# Patient Record
Sex: Female | Born: 1950 | Race: Black or African American | Hispanic: No | Smoking: Former smoker
Health system: Southern US, Community
[De-identification: ages and names within clinical notes are randomized; demographics above are authoritative.]

## PROBLEM LIST (undated history)

## (undated) DIAGNOSIS — M1990 Unspecified osteoarthritis, unspecified site: Secondary | ICD-10-CM

## (undated) DIAGNOSIS — N800 Endometriosis of uterus: Secondary | ICD-10-CM

## (undated) DIAGNOSIS — I951 Orthostatic hypotension: Secondary | ICD-10-CM

## (undated) DIAGNOSIS — N809 Endometriosis, unspecified: Secondary | ICD-10-CM

## (undated) DIAGNOSIS — H20029 Recurrent acute iridocyclitis, unspecified eye: Secondary | ICD-10-CM

## (undated) DIAGNOSIS — D259 Leiomyoma of uterus, unspecified: Secondary | ICD-10-CM

## (undated) DIAGNOSIS — D649 Anemia, unspecified: Secondary | ICD-10-CM

## (undated) DIAGNOSIS — N8003 Adenomyosis of the uterus: Secondary | ICD-10-CM

## (undated) DIAGNOSIS — I10 Essential (primary) hypertension: Secondary | ICD-10-CM

## (undated) DIAGNOSIS — D573 Sickle-cell trait: Secondary | ICD-10-CM

## (undated) HISTORY — DX: Adenomyosis of the uterus: N80.03

## (undated) HISTORY — DX: Endometriosis, unspecified: N80.9

## (undated) HISTORY — PX: BREAST LUMPECTOMY: SHX2

## (undated) HISTORY — DX: Unspecified osteoarthritis, unspecified site: M19.90

## (undated) HISTORY — DX: Recurrent acute iridocyclitis, unspecified eye: H20.029

## (undated) HISTORY — DX: Orthostatic hypotension: I95.1

## (undated) HISTORY — DX: Endometriosis of uterus: N80.0

## (undated) HISTORY — DX: Essential (primary) hypertension: I10

## (undated) HISTORY — DX: Sickle-cell trait: D57.3

## (undated) HISTORY — DX: Leiomyoma of uterus, unspecified: D25.9

## (undated) HISTORY — DX: Anemia, unspecified: D64.9

## (undated) HISTORY — PX: CHOLECYSTECTOMY: SHX55

## (undated) HISTORY — PX: FOOT SURGERY: SHX648

---

## 1984-09-29 HISTORY — PX: BREAST BIOPSY: SHX20

## 1999-01-10 ENCOUNTER — Emergency Department (HOSPITAL_COMMUNITY): Admission: EM | Admit: 1999-01-10 | Discharge: 1999-01-10 | Payer: Self-pay | Admitting: Emergency Medicine

## 1999-01-10 ENCOUNTER — Encounter: Payer: Self-pay | Admitting: Emergency Medicine

## 1999-04-23 ENCOUNTER — Encounter: Payer: Self-pay | Admitting: *Deleted

## 1999-04-26 ENCOUNTER — Ambulatory Visit (HOSPITAL_COMMUNITY): Admission: RE | Admit: 1999-04-26 | Discharge: 1999-04-27 | Payer: Self-pay | Admitting: *Deleted

## 2001-04-14 ENCOUNTER — Encounter: Admission: RE | Admit: 2001-04-14 | Discharge: 2001-04-14 | Payer: Self-pay | Admitting: Family Medicine

## 2001-04-14 ENCOUNTER — Other Ambulatory Visit: Admission: RE | Admit: 2001-04-14 | Discharge: 2001-04-14 | Payer: Self-pay | Admitting: Obstetrics & Gynecology

## 2001-05-14 ENCOUNTER — Encounter: Admission: RE | Admit: 2001-05-14 | Discharge: 2001-05-14 | Payer: Self-pay | Admitting: Family Medicine

## 2001-05-21 ENCOUNTER — Inpatient Hospital Stay (HOSPITAL_COMMUNITY): Admission: EM | Admit: 2001-05-21 | Discharge: 2001-05-24 | Payer: Self-pay

## 2001-05-24 ENCOUNTER — Encounter: Payer: Self-pay | Admitting: Family Medicine

## 2001-06-07 ENCOUNTER — Encounter: Admission: RE | Admit: 2001-06-07 | Discharge: 2001-06-07 | Payer: Self-pay | Admitting: Family Medicine

## 2001-08-04 ENCOUNTER — Encounter: Admission: RE | Admit: 2001-08-04 | Discharge: 2001-08-04 | Payer: Self-pay | Admitting: Family Medicine

## 2001-09-08 ENCOUNTER — Encounter: Admission: RE | Admit: 2001-09-08 | Discharge: 2001-09-08 | Payer: Self-pay | Admitting: Family Medicine

## 2002-01-28 ENCOUNTER — Encounter: Admission: RE | Admit: 2002-01-28 | Discharge: 2002-01-28 | Payer: Self-pay | Admitting: Family Medicine

## 2002-01-28 ENCOUNTER — Ambulatory Visit (HOSPITAL_COMMUNITY): Admission: RE | Admit: 2002-01-28 | Discharge: 2002-01-28 | Payer: Self-pay | Admitting: Family Medicine

## 2002-02-15 ENCOUNTER — Encounter: Admission: RE | Admit: 2002-02-15 | Discharge: 2002-02-15 | Payer: Self-pay | Admitting: Family Medicine

## 2002-06-21 ENCOUNTER — Encounter: Admission: RE | Admit: 2002-06-21 | Discharge: 2002-06-21 | Payer: Self-pay | Admitting: Family Medicine

## 2002-06-30 ENCOUNTER — Encounter: Admission: RE | Admit: 2002-06-30 | Discharge: 2002-06-30 | Payer: Self-pay | Admitting: Family Medicine

## 2002-07-11 ENCOUNTER — Other Ambulatory Visit: Admission: RE | Admit: 2002-07-11 | Discharge: 2002-07-11 | Payer: Self-pay | Admitting: Family Medicine

## 2002-07-11 ENCOUNTER — Encounter: Admission: RE | Admit: 2002-07-11 | Discharge: 2002-07-11 | Payer: Self-pay | Admitting: Family Medicine

## 2002-07-19 ENCOUNTER — Encounter: Admission: RE | Admit: 2002-07-19 | Discharge: 2002-07-19 | Payer: Self-pay | Admitting: Sports Medicine

## 2002-07-19 ENCOUNTER — Encounter: Payer: Self-pay | Admitting: Sports Medicine

## 2002-07-26 ENCOUNTER — Encounter: Admission: RE | Admit: 2002-07-26 | Discharge: 2002-09-15 | Payer: Self-pay | Admitting: *Deleted

## 2003-02-08 ENCOUNTER — Encounter: Admission: RE | Admit: 2003-02-08 | Discharge: 2003-02-08 | Payer: Self-pay | Admitting: Sports Medicine

## 2003-02-08 ENCOUNTER — Encounter: Admission: RE | Admit: 2003-02-08 | Discharge: 2003-02-08 | Payer: Self-pay | Admitting: Family Medicine

## 2003-02-08 ENCOUNTER — Encounter: Payer: Self-pay | Admitting: Sports Medicine

## 2003-02-27 ENCOUNTER — Encounter: Admission: RE | Admit: 2003-02-27 | Discharge: 2003-02-27 | Payer: Self-pay | Admitting: Sports Medicine

## 2003-11-27 ENCOUNTER — Encounter: Admission: RE | Admit: 2003-11-27 | Discharge: 2003-11-27 | Payer: Self-pay | Admitting: Family Medicine

## 2004-01-10 ENCOUNTER — Encounter: Admission: RE | Admit: 2004-01-10 | Discharge: 2004-01-10 | Payer: Self-pay | Admitting: Family Medicine

## 2004-03-04 ENCOUNTER — Encounter: Admission: RE | Admit: 2004-03-04 | Discharge: 2004-03-04 | Payer: Self-pay | Admitting: Sports Medicine

## 2004-03-07 ENCOUNTER — Encounter: Admission: RE | Admit: 2004-03-07 | Discharge: 2004-03-07 | Payer: Self-pay | Admitting: Sports Medicine

## 2004-04-03 ENCOUNTER — Encounter: Admission: RE | Admit: 2004-04-03 | Discharge: 2004-04-03 | Payer: Self-pay | Admitting: Sports Medicine

## 2004-04-03 ENCOUNTER — Other Ambulatory Visit: Admission: RE | Admit: 2004-04-03 | Discharge: 2004-04-03 | Payer: Self-pay | Admitting: Family Medicine

## 2004-04-03 ENCOUNTER — Encounter (INDEPENDENT_AMBULATORY_CARE_PROVIDER_SITE_OTHER): Payer: Self-pay | Admitting: Specialist

## 2004-04-17 ENCOUNTER — Encounter: Admission: RE | Admit: 2004-04-17 | Discharge: 2004-04-17 | Payer: Self-pay | Admitting: Family Medicine

## 2004-05-24 ENCOUNTER — Encounter: Admission: RE | Admit: 2004-05-24 | Discharge: 2004-05-24 | Payer: Self-pay | Admitting: Family Medicine

## 2004-07-25 ENCOUNTER — Ambulatory Visit (HOSPITAL_COMMUNITY): Admission: RE | Admit: 2004-07-25 | Discharge: 2004-07-25 | Payer: Self-pay | Admitting: Family Medicine

## 2004-07-25 ENCOUNTER — Ambulatory Visit: Payer: Self-pay | Admitting: Family Medicine

## 2004-08-12 ENCOUNTER — Ambulatory Visit: Payer: Self-pay | Admitting: Family Medicine

## 2004-09-09 ENCOUNTER — Ambulatory Visit (HOSPITAL_COMMUNITY): Admission: RE | Admit: 2004-09-09 | Discharge: 2004-09-09 | Payer: Self-pay | Admitting: Sports Medicine

## 2004-09-09 ENCOUNTER — Ambulatory Visit: Payer: Self-pay | Admitting: Sports Medicine

## 2004-10-24 ENCOUNTER — Ambulatory Visit: Payer: Self-pay | Admitting: Sports Medicine

## 2004-11-06 ENCOUNTER — Ambulatory Visit: Payer: Self-pay | Admitting: Family Medicine

## 2004-11-13 ENCOUNTER — Ambulatory Visit: Payer: Self-pay | Admitting: Family Medicine

## 2004-11-26 ENCOUNTER — Ambulatory Visit: Payer: Self-pay | Admitting: Sports Medicine

## 2006-08-29 ENCOUNTER — Encounter (INDEPENDENT_AMBULATORY_CARE_PROVIDER_SITE_OTHER): Payer: Self-pay | Admitting: *Deleted

## 2006-08-29 LAB — CONVERTED CEMR LAB

## 2006-09-25 ENCOUNTER — Ambulatory Visit: Payer: Self-pay | Admitting: Family Medicine

## 2006-09-30 ENCOUNTER — Ambulatory Visit (HOSPITAL_COMMUNITY): Admission: RE | Admit: 2006-09-30 | Discharge: 2006-09-30 | Payer: Self-pay | Admitting: Family Medicine

## 2006-09-30 IMAGING — US US PELVIS COMPLETE MODIFY
1 series · 18 of 25 positions shown · non-contrast
Comparison: [DATE].

CLINICAL DATA: Pelvic pain; fibroids.
TRANSABDOMINAL AND TRANSVAGINAL PELVIC ULTRASOUND:
TECHNIQUE: Both transabdominal and transvaginal ultrasound examinations of the pelvis were performed including evaluation of the uterus, ovaries, adnexal regions, and pelvic cul-de-sac.

[Series 1: us pelvis complete modify · 18 of 51 slices shown]
[im 1/51]
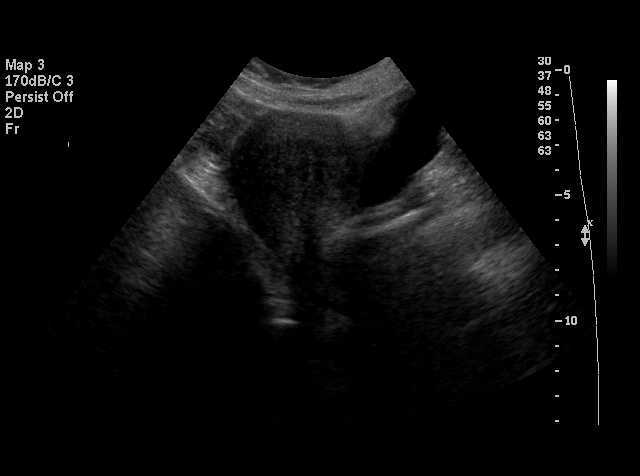
[im 5/51]
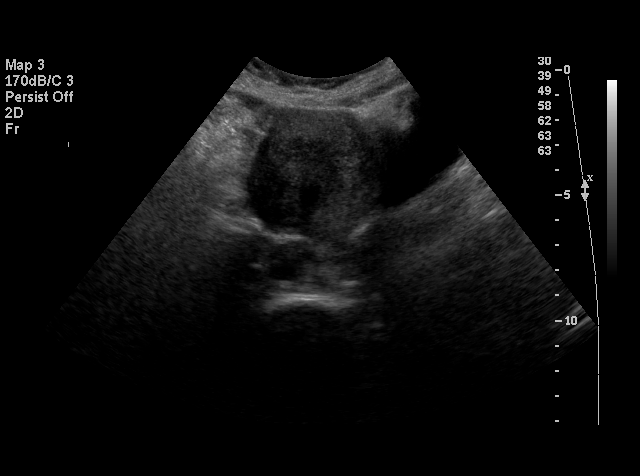
[im 7/51]
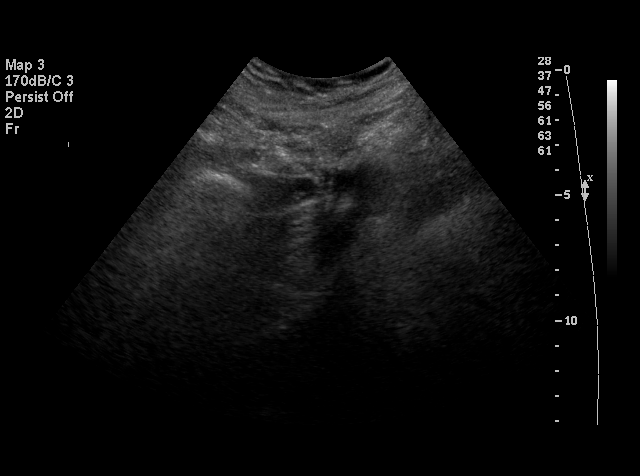
[im 9/51]
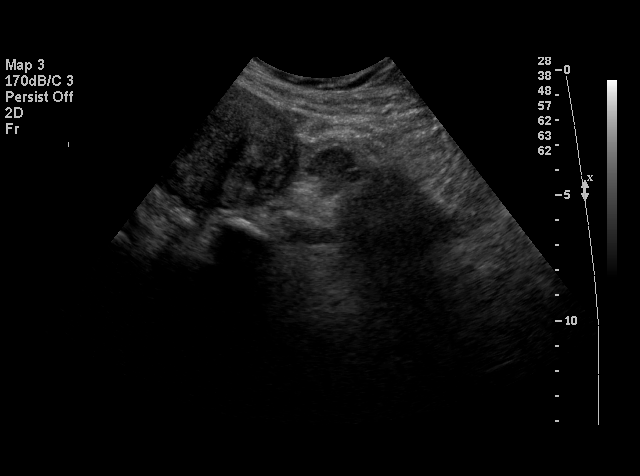
[im 13/51]
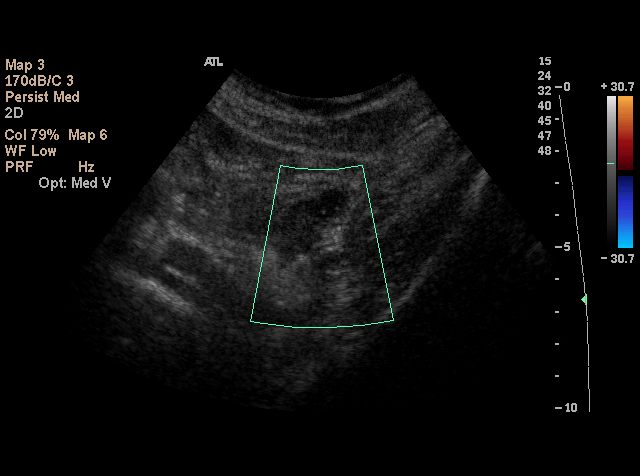
[im 15/51]
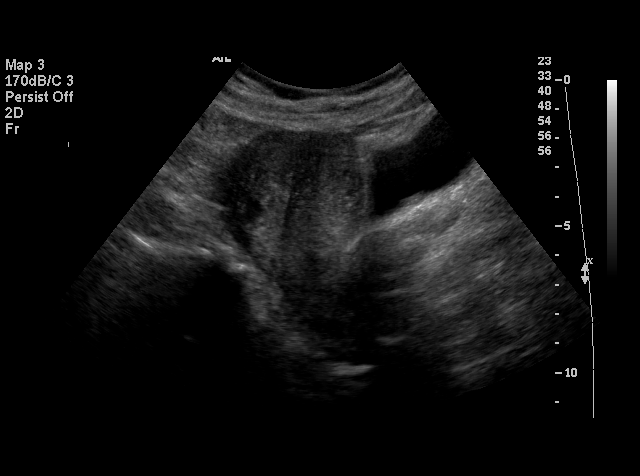
[im 19/51]
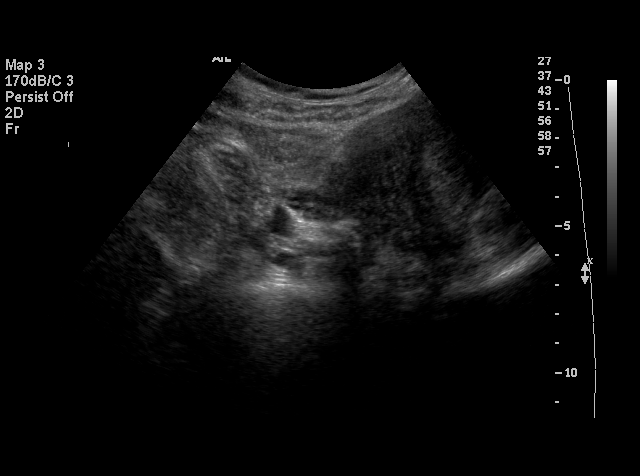
[im 21/51]
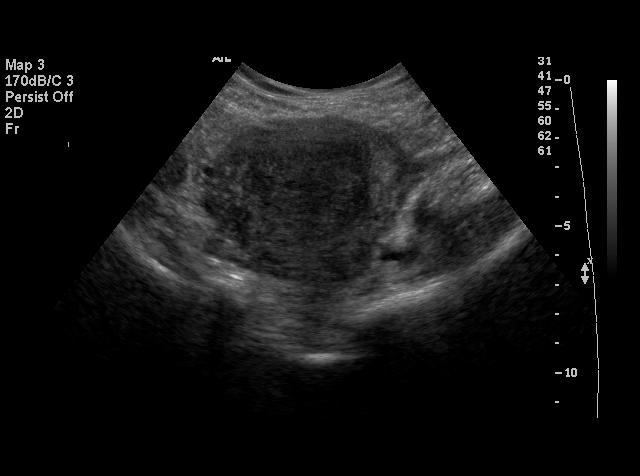
[im 23/51]
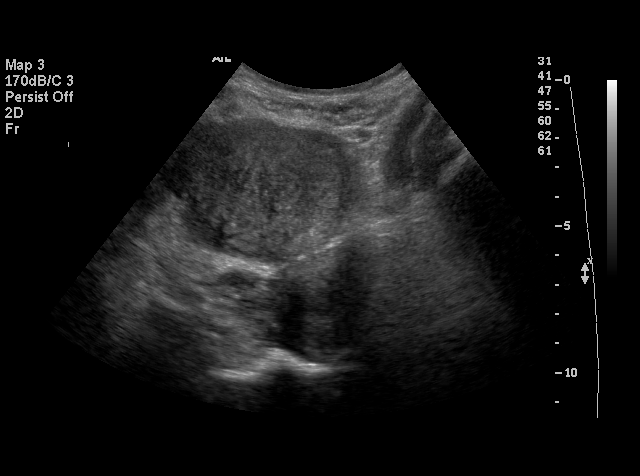
[im 28/51]
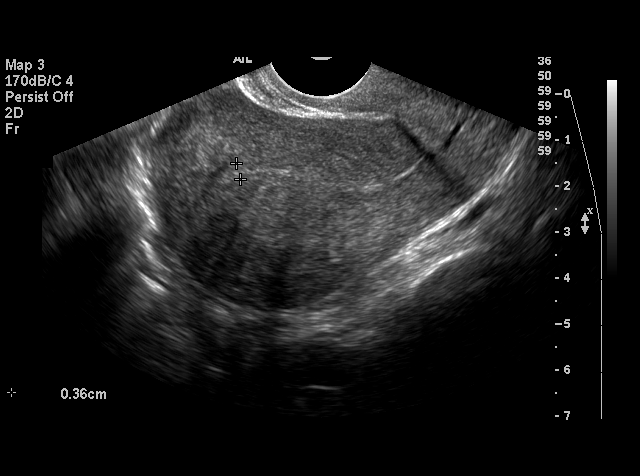
[im 30/51]
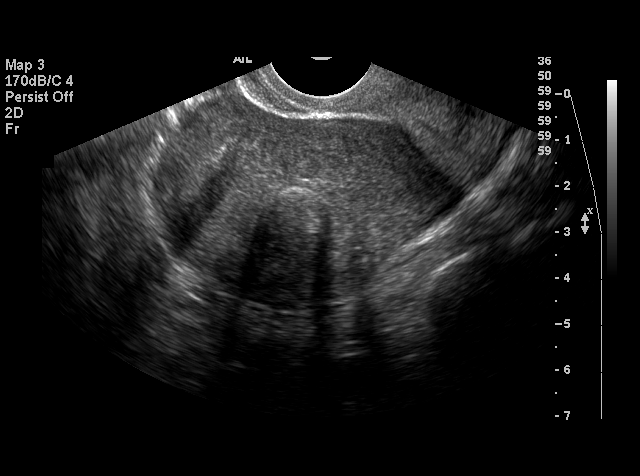
[im 32/51]
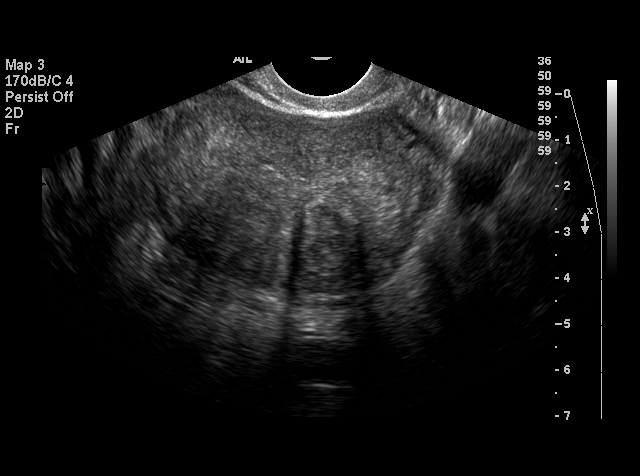
[im 36/51]
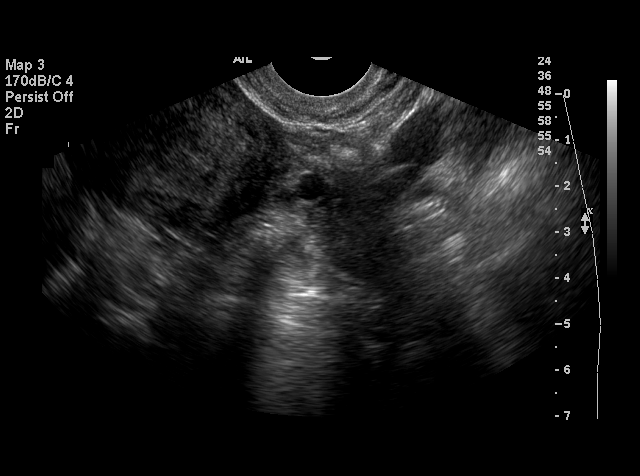
[im 38/51]
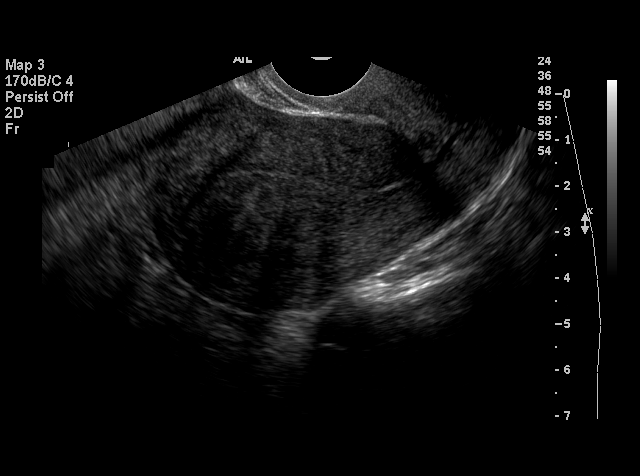
[im 42/51]
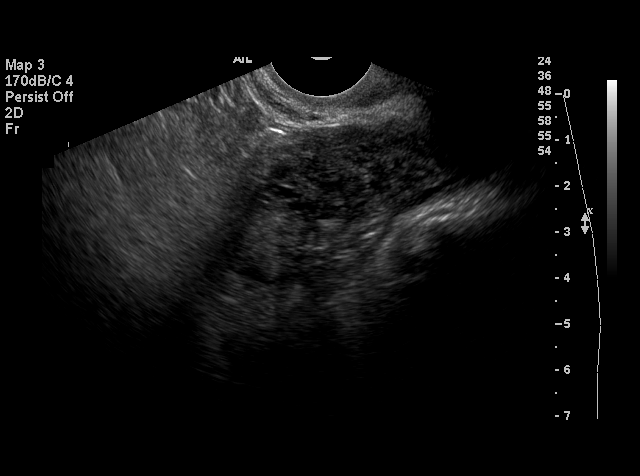
[im 44/51]
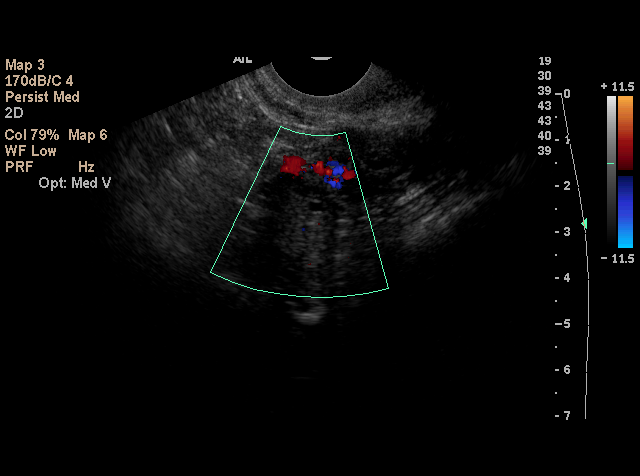
[im 46/51]
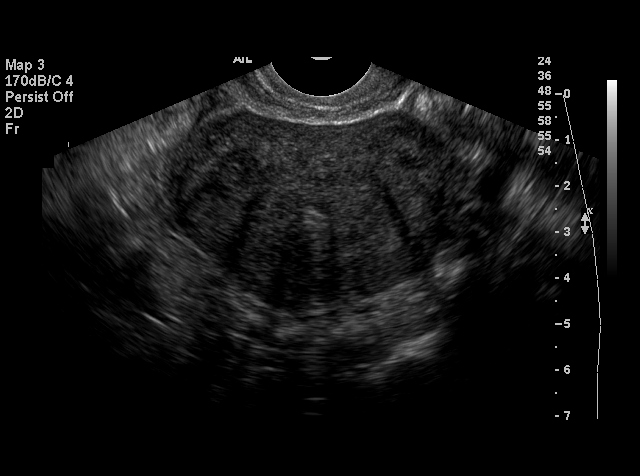
[im 51/51]
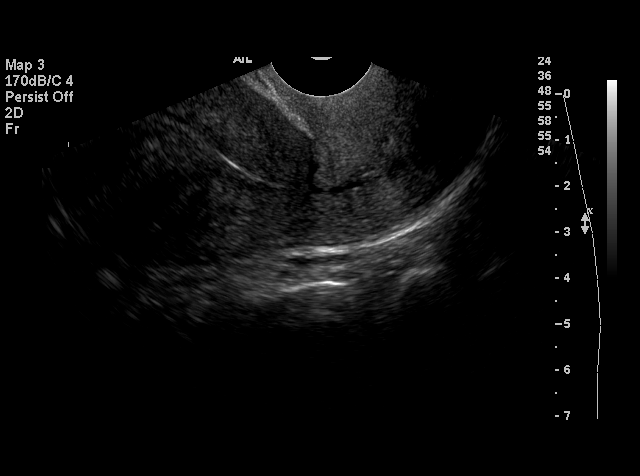

[18 of 25 positions shown; findings below may reference images not displayed]

The uterus is normal in size measuring 9.0 x 5.3 x 6.7 cm.  The endometrial stripe is thin and homogeneous measuring 3-4 mm in width.  A 1.8 cm fibroid is seen posteriorly at the mid segment.  The 2nd larger fibroid which is suspected on the prior ultrasound is not definitely seen today, but the overall echotexture is slightly heterogeneous which may be related an element of adenomyosis.  
Both ovaries have a normal size, shape, and echotexture. The right ovary measures 2.4 x 1.9 x 1.7 cm, and the left ovary measures 2.4 x 1.0 x 1.9 cm.
IMPRESSION: 1.  No significant change in the 1.8 cm myometrial fibroid posteriorly at the mid segment.  
2.  The echotexture is slightly, but diffusely heterogeneous which is likely related to an element of adenomyosis.

## 2006-11-04 ENCOUNTER — Ambulatory Visit: Payer: Self-pay | Admitting: Family Medicine

## 2006-11-26 DIAGNOSIS — D573 Sickle-cell trait: Secondary | ICD-10-CM | POA: Insufficient documentation

## 2006-11-26 DIAGNOSIS — I10 Essential (primary) hypertension: Secondary | ICD-10-CM | POA: Insufficient documentation

## 2006-11-27 ENCOUNTER — Encounter (INDEPENDENT_AMBULATORY_CARE_PROVIDER_SITE_OTHER): Payer: Self-pay | Admitting: *Deleted

## 2006-12-16 ENCOUNTER — Telehealth: Payer: Self-pay | Admitting: *Deleted

## 2006-12-16 ENCOUNTER — Emergency Department (HOSPITAL_COMMUNITY): Admission: EM | Admit: 2006-12-16 | Discharge: 2006-12-16 | Payer: Self-pay | Admitting: Emergency Medicine

## 2007-03-15 ENCOUNTER — Telehealth: Payer: Self-pay | Admitting: *Deleted

## 2007-06-21 ENCOUNTER — Encounter (INDEPENDENT_AMBULATORY_CARE_PROVIDER_SITE_OTHER): Payer: Self-pay | Admitting: Family Medicine

## 2007-06-21 ENCOUNTER — Ambulatory Visit: Payer: Self-pay | Admitting: Sports Medicine

## 2007-06-21 LAB — CONVERTED CEMR LAB
BUN: 11 mg/dL (ref 6–23)
CO2: 25 meq/L (ref 19–32)
Calcium: 9.5 mg/dL (ref 8.4–10.5)
Chloride: 106 meq/L (ref 96–112)
Creatinine, Ser: 0.71 mg/dL (ref 0.40–1.20)
Glucose, Bld: 97 mg/dL (ref 70–99)
Potassium: 4 meq/L (ref 3.5–5.3)
Sodium: 140 meq/L (ref 135–145)

## 2007-06-23 ENCOUNTER — Encounter: Admission: RE | Admit: 2007-06-23 | Discharge: 2007-06-23 | Payer: Self-pay | Admitting: Sports Medicine

## 2007-06-30 ENCOUNTER — Telehealth (INDEPENDENT_AMBULATORY_CARE_PROVIDER_SITE_OTHER): Payer: Self-pay | Admitting: Family Medicine

## 2007-07-19 ENCOUNTER — Telehealth: Payer: Self-pay | Admitting: *Deleted

## 2007-07-21 ENCOUNTER — Encounter (INDEPENDENT_AMBULATORY_CARE_PROVIDER_SITE_OTHER): Payer: Self-pay | Admitting: Family Medicine

## 2007-07-21 ENCOUNTER — Ambulatory Visit: Payer: Self-pay | Admitting: Family Medicine

## 2007-07-21 DIAGNOSIS — M5412 Radiculopathy, cervical region: Secondary | ICD-10-CM | POA: Insufficient documentation

## 2007-07-21 LAB — CONVERTED CEMR LAB
BUN: 9 mg/dL (ref 6–23)
CO2: 25 meq/L (ref 19–32)
Calcium: 9.7 mg/dL (ref 8.4–10.5)
Chloride: 105 meq/L (ref 96–112)
Creatinine, Ser: 0.74 mg/dL (ref 0.40–1.20)
Glucose, Bld: 87 mg/dL (ref 70–99)
Potassium: 4 meq/L (ref 3.5–5.3)
Sodium: 139 meq/L (ref 135–145)

## 2007-07-22 ENCOUNTER — Encounter (INDEPENDENT_AMBULATORY_CARE_PROVIDER_SITE_OTHER): Payer: Self-pay | Admitting: Family Medicine

## 2007-07-23 ENCOUNTER — Telehealth: Payer: Self-pay | Admitting: *Deleted

## 2007-07-26 ENCOUNTER — Encounter (INDEPENDENT_AMBULATORY_CARE_PROVIDER_SITE_OTHER): Payer: Self-pay | Admitting: Family Medicine

## 2007-11-02 ENCOUNTER — Emergency Department (HOSPITAL_COMMUNITY): Admission: EM | Admit: 2007-11-02 | Discharge: 2007-11-02 | Payer: Self-pay | Admitting: Emergency Medicine

## 2007-11-03 ENCOUNTER — Telehealth: Payer: Self-pay | Admitting: *Deleted

## 2007-11-04 ENCOUNTER — Ambulatory Visit: Payer: Self-pay | Admitting: Family Medicine

## 2007-11-04 ENCOUNTER — Encounter: Payer: Self-pay | Admitting: Family Medicine

## 2007-11-17 ENCOUNTER — Encounter: Payer: Self-pay | Admitting: *Deleted

## 2007-11-18 ENCOUNTER — Telehealth: Payer: Self-pay | Admitting: *Deleted

## 2007-11-22 ENCOUNTER — Encounter (INDEPENDENT_AMBULATORY_CARE_PROVIDER_SITE_OTHER): Payer: Self-pay | Admitting: Family Medicine

## 2007-12-09 ENCOUNTER — Encounter (INDEPENDENT_AMBULATORY_CARE_PROVIDER_SITE_OTHER): Payer: Self-pay | Admitting: Family Medicine

## 2007-12-09 ENCOUNTER — Ambulatory Visit: Payer: Self-pay | Admitting: Family Medicine

## 2007-12-09 LAB — CONVERTED CEMR LAB
ALT: 16 units/L (ref 0–35)
AST: 17 units/L (ref 0–37)
Albumin: 4.4 g/dL (ref 3.5–5.2)
Alkaline Phosphatase: 104 units/L (ref 39–117)
BUN: 13 mg/dL (ref 6–23)
CO2: 24 meq/L (ref 19–32)
Calcium: 9 mg/dL (ref 8.4–10.5)
Chloride: 108 meq/L (ref 96–112)
Cholesterol: 162 mg/dL (ref 0–200)
Creatinine, Ser: 0.72 mg/dL (ref 0.40–1.20)
Glucose, Bld: 92 mg/dL (ref 70–99)
HDL: 49 mg/dL (ref >39)
LDL Cholesterol: 90 mg/dL (ref 0–99)
Potassium: 3.9 meq/L (ref 3.5–5.3)
Sodium: 144 meq/L (ref 135–145)
Total Bilirubin: 0.7 mg/dL (ref 0.3–1.2)
Total CHOL/HDL Ratio: 3.3
Total Protein: 7.5 g/dL (ref 6.0–8.3)
Triglycerides: 114 mg/dL (ref <150)
VLDL: 23 mg/dL (ref 0–40)

## 2007-12-10 ENCOUNTER — Encounter (INDEPENDENT_AMBULATORY_CARE_PROVIDER_SITE_OTHER): Payer: Self-pay | Admitting: Family Medicine

## 2007-12-16 ENCOUNTER — Encounter (INDEPENDENT_AMBULATORY_CARE_PROVIDER_SITE_OTHER): Payer: Self-pay | Admitting: Family Medicine

## 2007-12-23 ENCOUNTER — Encounter (INDEPENDENT_AMBULATORY_CARE_PROVIDER_SITE_OTHER): Payer: Self-pay | Admitting: Family Medicine

## 2009-04-13 ENCOUNTER — Telehealth: Payer: Self-pay | Admitting: Family Medicine

## 2009-04-13 ENCOUNTER — Encounter: Payer: Self-pay | Admitting: Family Medicine

## 2009-04-13 ENCOUNTER — Ambulatory Visit: Payer: Self-pay | Admitting: Family Medicine

## 2009-04-13 LAB — CONVERTED CEMR LAB
BUN: 13 mg/dL (ref 6–23)
CO2: 25 meq/L (ref 19–32)
Calcium: 9.6 mg/dL (ref 8.4–10.5)
Chloride: 106 meq/L (ref 96–112)
Creatinine, Ser: 0.93 mg/dL (ref 0.40–1.20)
Glucose, Bld: 102 mg/dL — ABNORMAL HIGH (ref 70–99)
Potassium: 3.8 meq/L (ref 3.5–5.3)
Sodium: 140 meq/L (ref 135–145)

## 2009-06-27 ENCOUNTER — Telehealth: Payer: Self-pay | Admitting: *Deleted

## 2009-07-13 ENCOUNTER — Ambulatory Visit: Payer: Self-pay | Admitting: Family Medicine

## 2009-07-13 ENCOUNTER — Encounter: Payer: Self-pay | Admitting: Family Medicine

## 2009-07-16 ENCOUNTER — Encounter: Payer: Self-pay | Admitting: *Deleted

## 2009-07-19 ENCOUNTER — Ambulatory Visit: Payer: Self-pay | Admitting: Family Medicine

## 2009-07-19 ENCOUNTER — Ambulatory Visit (HOSPITAL_COMMUNITY): Admission: RE | Admit: 2009-07-19 | Discharge: 2009-07-19 | Payer: Self-pay | Admitting: Family Medicine

## 2009-07-19 ENCOUNTER — Encounter: Payer: Self-pay | Admitting: Family Medicine

## 2009-07-19 LAB — CONVERTED CEMR LAB
BUN: 16 mg/dL (ref 6–23)
CO2: 22 meq/L (ref 19–32)
Calcium: 9.7 mg/dL (ref 8.4–10.5)
Chloride: 104 meq/L (ref 96–112)
Creatinine, Ser: 0.79 mg/dL (ref 0.40–1.20)
Glucose, Bld: 86 mg/dL (ref 70–99)
HCT: 35.4 % — ABNORMAL LOW (ref 36.0–46.0)
Hemoglobin: 11.2 g/dL — ABNORMAL LOW (ref 12.0–15.0)
MCHC: 31.6 g/dL (ref 30.0–36.0)
MCV: 81.2 fL (ref 78.0–100.0)
Platelets: 269 10*3/uL (ref 150–400)
Potassium: 3.8 meq/L (ref 3.5–5.3)
RBC: 4.36 M/uL (ref 3.87–5.11)
RDW: 15.1 % (ref 11.5–15.5)
Sodium: 139 meq/L (ref 135–145)
TSH: 1.553 microintl units/mL (ref 0.350–4.500)
WBC: 6 10*3/uL (ref 4.0–10.5)

## 2009-07-20 ENCOUNTER — Telehealth: Payer: Self-pay | Admitting: *Deleted

## 2009-07-24 ENCOUNTER — Encounter: Payer: Self-pay | Admitting: *Deleted

## 2009-08-09 ENCOUNTER — Ambulatory Visit: Payer: Self-pay | Admitting: Family Medicine

## 2009-08-09 ENCOUNTER — Encounter: Payer: Self-pay | Admitting: Family Medicine

## 2009-08-10 LAB — CONVERTED CEMR LAB
BUN: 17 mg/dL (ref 6–23)
CO2: 25 meq/L (ref 19–32)
Calcium: 10.3 mg/dL (ref 8.4–10.5)
Chloride: 105 meq/L (ref 96–112)
Creatinine, Ser: 0.8 mg/dL (ref 0.40–1.20)
Glucose, Bld: 74 mg/dL (ref 70–99)
Potassium: 3.8 meq/L (ref 3.5–5.3)
Sodium: 143 meq/L (ref 135–145)

## 2009-09-10 ENCOUNTER — Encounter: Payer: Self-pay | Admitting: Family Medicine

## 2009-09-10 ENCOUNTER — Ambulatory Visit (HOSPITAL_COMMUNITY): Admission: RE | Admit: 2009-09-10 | Discharge: 2009-09-10 | Payer: Self-pay | Admitting: Family Medicine

## 2009-09-10 ENCOUNTER — Ambulatory Visit: Payer: Self-pay | Admitting: Family Medicine

## 2009-09-10 ENCOUNTER — Other Ambulatory Visit: Admission: RE | Admit: 2009-09-10 | Discharge: 2009-09-10 | Payer: Self-pay | Admitting: Family Medicine

## 2009-09-10 ENCOUNTER — Encounter: Payer: Self-pay | Admitting: *Deleted

## 2009-09-10 LAB — CONVERTED CEMR LAB: Whiff Test: NEGATIVE

## 2009-09-11 ENCOUNTER — Telehealth: Payer: Self-pay | Admitting: *Deleted

## 2009-09-12 ENCOUNTER — Telehealth: Payer: Self-pay | Admitting: *Deleted

## 2009-09-13 ENCOUNTER — Encounter: Payer: Self-pay | Admitting: *Deleted

## 2009-09-14 ENCOUNTER — Telehealth: Payer: Self-pay | Admitting: *Deleted

## 2009-09-14 ENCOUNTER — Encounter: Payer: Self-pay | Admitting: *Deleted

## 2009-09-16 ENCOUNTER — Ambulatory Visit (HOSPITAL_BASED_OUTPATIENT_CLINIC_OR_DEPARTMENT_OTHER): Admission: RE | Admit: 2009-09-16 | Discharge: 2009-09-16 | Payer: Self-pay | Admitting: Family Medicine

## 2009-09-16 ENCOUNTER — Encounter: Payer: Self-pay | Admitting: Family Medicine

## 2009-09-18 ENCOUNTER — Encounter: Payer: Self-pay | Admitting: Family Medicine

## 2009-09-23 ENCOUNTER — Ambulatory Visit: Payer: Self-pay | Admitting: Internal Medicine

## 2009-09-25 ENCOUNTER — Encounter: Payer: Self-pay | Admitting: Family Medicine

## 2009-10-03 ENCOUNTER — Encounter: Payer: Self-pay | Admitting: Family Medicine

## 2009-10-05 ENCOUNTER — Ambulatory Visit: Payer: Self-pay | Admitting: Family Medicine

## 2009-10-05 DIAGNOSIS — R87610 Atypical squamous cells of undetermined significance on cytologic smear of cervix (ASC-US): Secondary | ICD-10-CM | POA: Insufficient documentation

## 2009-10-12 ENCOUNTER — Encounter: Payer: Self-pay | Admitting: *Deleted

## 2009-10-12 ENCOUNTER — Encounter: Payer: Self-pay | Admitting: Family Medicine

## 2009-10-16 ENCOUNTER — Encounter: Payer: Self-pay | Admitting: *Deleted

## 2009-10-16 LAB — CONVERTED CEMR LAB
Cholesterol: 161 mg/dL
LDL Cholesterol: 91 mg/dL
Triglycerides: 130 mg/dL

## 2009-10-18 ENCOUNTER — Encounter: Admission: RE | Admit: 2009-10-18 | Discharge: 2009-10-18 | Payer: Self-pay | Admitting: Family Medicine

## 2009-10-24 ENCOUNTER — Ambulatory Visit (HOSPITAL_COMMUNITY): Admission: RE | Admit: 2009-10-24 | Discharge: 2009-10-24 | Payer: Self-pay | Admitting: Family Medicine

## 2009-11-01 ENCOUNTER — Ambulatory Visit: Payer: Self-pay | Admitting: Family Medicine

## 2009-11-01 ENCOUNTER — Telehealth: Payer: Self-pay | Admitting: *Deleted

## 2009-11-01 LAB — CONVERTED CEMR LAB: Pap Smear: NORMAL

## 2009-11-02 ENCOUNTER — Encounter: Payer: Self-pay | Admitting: Family Medicine

## 2009-11-03 ENCOUNTER — Encounter: Payer: Self-pay | Admitting: Family Medicine

## 2009-12-10 ENCOUNTER — Encounter: Payer: Self-pay | Admitting: *Deleted

## 2009-12-10 ENCOUNTER — Encounter (INDEPENDENT_AMBULATORY_CARE_PROVIDER_SITE_OTHER): Payer: Self-pay | Admitting: *Deleted

## 2009-12-13 ENCOUNTER — Telehealth: Payer: Self-pay | Admitting: Family Medicine

## 2009-12-19 ENCOUNTER — Encounter: Payer: Self-pay | Admitting: *Deleted

## 2009-12-21 ENCOUNTER — Ambulatory Visit (HOSPITAL_COMMUNITY): Admission: RE | Admit: 2009-12-21 | Discharge: 2009-12-21 | Payer: Self-pay | Admitting: Family Medicine

## 2009-12-21 ENCOUNTER — Ambulatory Visit: Payer: Self-pay | Admitting: Family Medicine

## 2009-12-21 DIAGNOSIS — D259 Leiomyoma of uterus, unspecified: Secondary | ICD-10-CM | POA: Insufficient documentation

## 2010-02-14 ENCOUNTER — Telehealth: Payer: Self-pay | Admitting: *Deleted

## 2010-02-18 ENCOUNTER — Encounter (INDEPENDENT_AMBULATORY_CARE_PROVIDER_SITE_OTHER): Payer: Self-pay | Admitting: *Deleted

## 2010-03-21 ENCOUNTER — Encounter (INDEPENDENT_AMBULATORY_CARE_PROVIDER_SITE_OTHER): Payer: Self-pay | Admitting: *Deleted

## 2010-03-22 ENCOUNTER — Ambulatory Visit: Payer: Self-pay | Admitting: Gastroenterology

## 2010-04-04 ENCOUNTER — Telehealth: Payer: Self-pay | Admitting: Gastroenterology

## 2010-04-05 ENCOUNTER — Ambulatory Visit: Payer: Self-pay | Admitting: Gastroenterology

## 2010-04-09 ENCOUNTER — Encounter: Payer: Self-pay | Admitting: Gastroenterology

## 2010-08-15 ENCOUNTER — Ambulatory Visit: Payer: Self-pay | Admitting: Family Medicine

## 2010-08-15 ENCOUNTER — Encounter: Payer: Self-pay | Admitting: Family Medicine

## 2010-08-15 DIAGNOSIS — M722 Plantar fascial fibromatosis: Secondary | ICD-10-CM | POA: Insufficient documentation

## 2010-08-15 DIAGNOSIS — R05 Cough: Secondary | ICD-10-CM

## 2010-08-15 DIAGNOSIS — R053 Chronic cough: Secondary | ICD-10-CM | POA: Insufficient documentation

## 2010-08-15 LAB — CONVERTED CEMR LAB
BUN: 18 mg/dL (ref 6–23)
CO2: 27 meq/L (ref 19–32)
Calcium: 9.4 mg/dL (ref 8.4–10.5)
Chloride: 105 meq/L (ref 96–112)
Creatinine, Ser: 0.7 mg/dL (ref 0.40–1.20)
Direct LDL: 60 mg/dL
Glucose, Bld: 98 mg/dL (ref 70–99)
Potassium: 3.9 meq/L (ref 3.5–5.3)
Sodium: 139 meq/L (ref 135–145)

## 2010-08-16 ENCOUNTER — Encounter: Payer: Self-pay | Admitting: Family Medicine

## 2010-09-13 ENCOUNTER — Encounter: Payer: Self-pay | Admitting: Family Medicine

## 2010-10-20 ENCOUNTER — Encounter: Payer: Self-pay | Admitting: Family Medicine

## 2010-10-29 NOTE — Miscellaneous (Signed)
Summary: ROI/RB  Clinical Lists Changes ROI faxed to Dr Debria Garret office.Tessie Fass CMA  July 24, 2009 1:48 PM  Appended Document: PMHx update    Clinical Lists Changes  Observations: Added new observation of PAST MED HX: h/o anemia, resolved with menopause hospitalized 05/21/01 -palpitations/CP  h/o uterine fibroids and adenomyosis (08/02/2009 14:48) Added new observation of PAST SURG HX: adenosine cardiolite-nml, EF 66% - 04/29/2001 c-section - 09/30/1991 lap chole - 03/30/1999 lumpectomy 1994- benign Endometrial bx 03/2007 - WNL, scant benign endometrial mucosa, no atypia Endometrial bx 06/2009 - WNL, scant atrophic endometrium, no atypia (08/02/2009 14:48)        Past History:  Past Medical History: h/o anemia, resolved with menopause hospitalized 05/21/01 -palpitations/CP  h/o uterine fibroids and adenomyosis  Past Surgical History: adenosine cardiolite-nml, EF 66% - 04/29/2001 c-section - 09/30/1991 lap chole - 03/30/1999 lumpectomy 1994- benign Endometrial bx 03/2007 - WNL, scant benign endometrial mucosa, no atypia Endometrial bx 06/2009 - WNL, scant atrophic endometrium, no atypia

## 2010-10-29 NOTE — Assessment & Plan Note (Signed)
Summary: upper respitory,tcb   Vital Signs:  Patient profile:   60 year old female Height:      63.75 inches Weight:      169 pounds BMI:     29.34 Temp:     98.0 degrees F oral Pulse rate:   71 / minute BP sitting:   138 / 83  (left arm) Cuff size:   regular  Vitals Entered By: Tessie Fass CMA (October 05, 2009 8:40 AM) CC: cough and congestion x 2 weeks Is Patient Diabetic? No Pain Assessment Patient in pain? no        Primary Care Provider:  Eustaquio Boyden  MD  CC:  cough and congestion x 2 weeks.  History of Present Illness: CC: cough/congestion x 2 wks  HPI: Productive cough of 17 days duration, mucus is yellow in color. It is getting progressively better. Current symtpoms also  include running nose, yellow in color and headaches, not associated with the cough and less frequent since BP control.  She took Coricidin HBP the first week but has since stopped this medication. Denies N/V or earaches. History of sinus pressure, body aches, sore throat and fever but all have since resolved. Eating and drinking well. No urinary or bowel symptoms attributed to the cough. No history of allergies or asmtha.   Patient has had flu shot.   Also endorses some spotting- last period was 2009.  endometrial biopsy last year WNL (some atrophy).  Pt seen by Cards, started on amlodipine as well as current regimen of lisinopril/HCTZ and metoprolol.  Habits & Providers  Alcohol-Tobacco-Diet     Alcohol drinks/day: none     Tobacco Status: quit     Tobacco Counseling: to remain off tobacco products     Year Started: 11/2003     Year Quit: 2005     Pack years: 15  Current Medications (verified): 1)  Aspirin 81 Mg Chew (Aspirin) 2)  One-A-Day Womens Formula  Tabs (Multiple Vitamins-Calcium) 3)  Lisinopril-Hydrochlorothiazide 20-25 Mg Tabs (Lisinopril-Hydrochlorothiazide) .... Take One Daily 4)  Flexeril 10 Mg Tabs (Cyclobenzaprine Hcl) .... Take One By Mouth Two Times A Day As Needed  Muscle Spasm 5)  Metoprolol Tartrate 25 Mg Tabs (Metoprolol Tartrate) .... Take One By Mouth Two Times A Day 6)  Nitrostat 0.4 Mg Subl (Nitroglycerin) .... Take One Sl Q73min X 3 As Needed Chest Pain.  Call Doctor If Used. 7)  Amlodipine Besylate 5 Mg Tabs (Amlodipine Besylate) .... One By Mouth Daily 8)  Hydromet 5-1.5 Mg/57ml Syrp (Hydrocodone-Homatropine) .... Take One Teaspoon Three Times A Day As Needed Cough, Qs 2 Wks 9)  Mucinex 600 Mg Xr12h-Tab (Guaifenesin) .... Take One By Mouth Two Times A Day As Needed Cough - Mucolytic  Allergies (verified): 1)  ! Codeine  Past History:  Past medical, surgical, family and social histories (including risk factors) reviewed for relevance to current acute and chronic problems.  Past Medical History: Reviewed history from 09/10/2009 and no changes required. h/o anemia, resolved with menopause hospitalized 05/21/01 -palpitations/CP 2.6cm posterior uterine fibroid  adenomyosis  Past Surgical History: Reviewed history from 08/02/2009 and no changes required. adenosine cardiolite-nml, EF 66% - 04/29/2001 c-section - 09/30/1991 lap chole - 03/30/1999 lumpectomy 1994- benign Endometrial bx 03/2007 - WNL, scant benign endometrial mucosa, no atypia Endometrial bx 06/2009 - WNL, scant atrophic endometrium, no atypia  Family History: Reviewed history from 11/26/2006 and no changes required. Brother died at 52 yo with DM, HTN, kidney dz., father died at 49  with CVA/ prostate ca, HTN, and arthritis, mother died age 63 of pancreatic ca.  Had HTN, Sister died at age 34of cancer, another sister died at age 57 of cancer. Uterine fibroids run in family.  Social History: Reviewed history from 04/13/2009 and no changes required. Laid off 1/02 from [employer] after an injury.  Quit smoking 04/29/01.  working again as of visit 03/2009  Physical Exam  General:  Well-developed,well-nourished,in no acute distress; alert,appropriate and cooperative throughout  examination Head:  Normocephalic and atraumatic without obvious abnormalities. No apparent alopecia or balding. Mouth:  Oral mucosa and oropharynx without lesions or exudates.  Lungs:  Normal respiratory effort, chest expands symmetrically. Lungs are clear to auscultation.  crackles bibasilarly that clear with cough. Heart:  Normal rate and regular rhythm. S1 and S2 normal without gallop, murmur, click, rub or other extra sounds. Abdomen:  Bowel sounds positive,abdomen soft and non-tender without masses, organomegaly or hernias noted. 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  to reschedule.  Orders: [facility]- Est  Level 4 (14782)  Problem # 2:  COUGH (ICD-786.2)  sounds like residual from viral URTI.  cough suppressant, expectorant for next 2 wks, if not improving, return for re evaluation.  No fevers or other indications of bacterial superinfection.    Orders: [facility]- Est  Level 4 (95621)  Problem # 3:  HYPERTENSION, BENIGN SYSTEMIC (ICD-401.1)  added norvasc per cards. no records available to me yet. Her updated medication list for this problem includes:    Lisinopril-hydrochlorothiazide 20-25 Mg Tabs (Lisinopril-hydrochlorothiazide) .[name]... Take one daily    Metoprolol Tartrate 25 Mg Tabs (Metoprolol tartrate) .[name]... Take one by mouth two times a day    Amlodipine Besylate 5 Mg Tabs (Amlodipine besylate) ..... One by mouth daily  BP today: 138/83 Prior BP: 152/94 (09/10/2009)  Labs Reviewed: K+: 3.8 (08/09/2009) Creat: : 0.80 (08/09/2009)   Chol: 162 (12/09/2007)   HDL: 49 (12/09/2007)   LDL: 90 (12/09/2007)   TG: 114 (12/09/2007)  Orders: [facility]- Est  Level 4 (30865)  Problem # 4:  ASCUS PAP (ICD-795.01)  given this finding and abnormal cervix on pap last month , will send to colposcopy clinic.  h/o postmenopausal bleeding, but normal endo bx per records.  Orders: [facility]- Est  Level 4 (78469)  Complete  Medication List: 1)  Aspirin 81 Mg Chew (Aspirin) 2)  One-a-day Womens Formula Tabs (Multiple vitamins-calcium) 3)  Lisinopril-hydrochlorothiazide 20-25 Mg Tabs (Lisinopril-hydrochlorothiazide) .... Take one daily 4)  Flexeril 10 Mg Tabs (Cyclobenzaprine hcl) .... Take one by mouth two times a day as needed muscle spasm 5)  Metoprolol Tartrate 25 Mg Tabs (Metoprolol tartrate) .... Take one by mouth two times a day 6)  Nitrostat 0.4 Mg Subl (Nitroglycerin) .... Take one sl q51min x 3 as needed chest pain.  call doctor if used. 7)  Amlodipine Besylate 5 Mg Tabs (Amlodipine besylate) .... One by mouth daily 8)  Hydromet 5-1.5 Mg/63ml Syrp (Hydrocodone-homatropine) .... Take one teaspoon three times a day as needed cough, qs 2 wks 9)  Mucinex 600 Mg Xr12h-tab (Guaifenesin) .... Take one by mouth two times a day as needed cough - mucolytic  Patient Instructions: 1)  Please return in 1 month for follow up. 2)  For your congestion and cough, try the hydrocodone syrup - caution with this as it is related to codeine. 3)  We will set you up with an appointment for colposcopy. 4)  Please obtain mammogram.  5)  Your sleep study was normal. 6)  You missed your thyroid ultrasound.  To reschedule, call our clinic. 7)  Great to see you today!  Call clinic with questions. Prescriptions: MUCINEX 600 MG XR12H-TAB (GUAIFENESIN) take one by mouth two times a day as needed cough - mucolytic  #30 x 0   Entered and Authorized by:   Eustaquio Boyden  MD   Signed by:   Eustaquio Boyden  MD on 10/05/2009   Method used:   Electronically to        [facility] [address] (retail)       [phone]       [place], [place]  [address]       Ph: [phone]       Fax: [phone]   RxID:   [id number] HYDROMET 5-1.5 MG/5ML SYRP (HYDROCODONE-HOMATROPINE) take one teaspoon three times a day as needed cough, QS 2 wks  #1 x 0   Entered and Authorized by:   Eustaquio Boyden  MD    Signed by:   Eustaquio Boyden  MD on 10/05/2009   Method used:   Print then Give to Patient   RxID:   [id number]    Prevention & Chronic Care Immunizations   Influenza vaccine: Fluvax Non-MCR  (07/19/2009)    Tetanus booster: 08/29/2001: Done.   Tetanus booster due: 08/30/2011    Pneumococcal vaccine: Not documented  Colorectal Screening   Hemoccult: Done.  (08/29/2006)    Colonoscopy: Not documented  Other Screening   Pap smear: Done.  (08/29/2006)    Mammogram: Done.  (10/30/2005)   Mammogram action/deferral: Ordered  (08/09/2009)   Smoking status: quit  (10/05/2009)  Lipids   Total Cholesterol: 162  (12/09/2007)   LDL: 90  (12/09/2007)   LDL Direct: Not documented   HDL: 49  (12/09/2007)   Triglycerides: 114  (12/09/2007)  Hypertension   Last Blood Pressure: 138 / 83  (10/05/2009)   Serum creatinine: 0.80  (08/09/2009)   Serum potassium 3.8  (08/09/2009)    Hypertension flowsheet reviewed?: Yes   Progress toward BP goal: At goal  Self-Management Support :   Personal Goals (by the next clinic visit) :      Personal blood pressure goal: 140/90  (08/09/2009)   Hypertension self-management support: BP self-monitoring log, Written self-care plan, Pre-printed educational material  (09/10/2009)    Hypertension self-management support not done because: Good outcomes  (08/09/2009)

## 2010-10-29 NOTE — Miscellaneous (Signed)
Summary: [facility] radiology  Clinical Lists Changes she did not keep appt with radiology yesterday.Golden Circle RN  September 25, 2009 10:57 AM  thanks. Eustaquio Boyden  MD  September 30, 2009 9:51 PM

## 2010-10-29 NOTE — Miscellaneous (Signed)
Summary: RE: [place] MEDICAID  Clinical Lists Changes Try to prior approve thyroid ultrasound, medicaid numbers did not match. Called pt and instructed her to call medicaid and straighten this out so we can get the ultrasound approved for 09/13/2009.Molly Maduro [facility] CMA  September 10, 2009 5:10 PM

## 2010-10-29 NOTE — Progress Notes (Signed)
Summary: test results  Phone Note Call from Patient Call back at [phone]   Reason for Call: Talk to Nurse Summary of Call: pt is requesting her x-ray results Initial call taken by: ERIN LEVAN,  June 30, 2007 3:52 PM  Follow-up for Phone Call        tried to call her back - this number was for someone else??? Follow-up by: Rolm Gala MD,  July 01, 2007 12:27 PM  Additional Follow-up for Phone Call Additional follow up Details #1::        Tried again at this number, reached voice mailbox of unknown person - not patient.  Did not leave message. Additional Follow-up by: Rolm Gala MD,  July 05, 2007 9:46 AM

## 2010-10-29 NOTE — Assessment & Plan Note (Signed)
Summary: shoulder/neck/pain w headaches/bmc  Medications Added BAYER ASPIRIN 325 MG TABS (ASPIRIN) Take 1 tablet by mouth once a day PROTONIX 40 MG TBEC (PANTOPRAZOLE SODIUM) Take 1 tablet by mouth once a day TOPROL XL 100 MG TB24 (METOPROLOL SUCCINATE) Take 1 tablet by mouth once a day NAPROSYN 250 MG  TABS (NAPROXEN) Take 2 pills initially and then take 1 pill every 8 hours as needed for pain FLEXERIL 10 MG  TABS (CYCLOBENZAPRINE HCL) take one before bed as needed for pain        Vital Signs:  Patient Profile:   60 Years Old Female Weight:      164.8 pounds Temp:     98.3 degrees F Pulse rate:   74 / minute BP sitting:   162 / 102  (left arm)  Pt. in pain?   yes    Location:   left neck, arm, shoulder    Intensity:   7    Type:       aching  Vitals Entered ByJacki Cones RN (June 21, 2007 8:42 AM)                  Chief Complaint:  left arm, neck, shoulder, and pain.  History of Present Illness: Pain in neck, shoulders, back L>R x 3 weeks.  She does not recall any exertion or injury that may have caused pain.  She describes the pain as intermittantly sharp and dull.  The pain is often sharp in the muscles behind her neck and around her L shoulder blade and dull at her deltoid.  She also points to painlower on her back but on the left side.  She notes that the the pain sometimes seems to move to her right side but does not have a classical presentation of radiation. She has tried ibuprofen withl minimal releif and has not had relief with ice or heat.  She states the course has not improved or worsened in the past 3 weeks but it seems to "flare up" and thus she decided to come in.  She notes no tingling, numbness, weakness.     She had a similar episode in past.     Social History:    Reviewed history from 11/26/2006 and no changes required:       Laid off 1/02 from [employer] after an injury.  Quit smoking 04/29/01   Risk Factors:  Tobacco use:  quit    Year  quit:  2004    Physical Exam  General:     Well-developed,well-nourished,in no acute distress; alert,appropriate and cooperative throughout examination Msk:     normal neck rom.  no joint tenderness, no joint swelling, no joint warmth, no redness over joints, no joint deformities, and no joint instability.  pain over entire shoulder, neck, upper back on left side.      Impression & Recommendations:  Problem # 1:  NECK PAIN (ICD-723.1) send for xray cpone.  eval; arthrirtis.  d/w dr fields.  start naprosyn, flexeril. Her updated medication list for this problem includes:    Bayer Aspirin 325 Mg Tabs (Aspirin) .[name]... Take 1 tablet by mouth once a day    Naprosyn 250 Mg Tabs (Naproxen) .[name]... Take 2 pills initially and then take 1 pill every 8 hours as needed for pain    Flexeril 10 Mg Tabs (Cyclobenzaprine hcl) .[name]... Take one before bed as needed for pain  Orders: [facility]- Est  Level 4 (01601)   Problem # 2:  HYPERTENSION,  BENIGN SYSTEMIC (ICD-401.1) check bmp since started ace.  will need to discuss more next visit Her updated medication list for this problem includes:    Toprol Xl 100 Mg Tb24 (Metoprolol succinate) .[name]... Take 1 tablet by mouth once a day  Orders: Basic Met-[facility] ([address]86) [facility]- Est  Level 4 (57846)   Problem # 3:  SICKLE CELL TRAIT (ICD-282.5) gyn told her she is ane,ic.  will check cbc and consider iron nwext visit. Orders: CBC-[facility] (96295) [facility]- Est  Level 4 (28413)   Complete Medication List: 1)  Bayer Aspirin 325 Mg Tabs (Aspirin) .... Take 1 tablet by mouth once a day 2)  Protonix 40 Mg Tbec (Pantoprazole sodium) .... Take 1 tablet by mouth once a day 3)  Toprol Xl 100 Mg Tb24 (Metoprolol succinate) .... Take 1 tablet by mouth once a day 4)  Naprosyn 250 Mg Tabs (Naproxen) .... Take 2 pills initially and then take 1 pill every 8 hours as needed for pain 5)  Flexeril 10 Mg Tabs (Cyclobenzaprine hcl) .... Take one before bed as needed for pain  Other  Orders: Diagnostic X-Ray/Fluoroscopy (Diagnostic X-Ray/Flu)     Prescriptions: FLEXERIL 10 MG  TABS (CYCLOBENZAPRINE HCL) take one before bed as needed for pain  #30 x 0   Entered and Authorized by:   Rolm Gala MD   Signed by:   Rolm Gala MD on 06/21/2007   Method used:   Print then Give to Patient   RxID:   [id number] NAPROSYN 250 MG  TABS (NAPROXEN) Take 2 pills initially and then take 1 pill every 8 hours as needed for pain  #90 x 0   Entered and Authorized by:   Rolm Gala MD   Signed by:   Rolm Gala MD on 06/21/2007   Method used:   Print then Give to Patient   RxID:   [id number]  ]

## 2010-10-29 NOTE — Progress Notes (Signed)
Summary: phn msg  Phone Note Call from Patient Call back at [facility] Phone [phone]   Caller: Patient Summary of Call: Pt not understanding what test she is needing and what about the medicaid.  Please call back to explain. Initial call taken by: Clydell Hakim,  September 11, 2009 3:40 PM  Follow-up for Phone Call        Called pt and explained to her that her thyroid US is on 09/13/2009 @ [facility] and that her medicaid ID number did not match the one in the system. Explained to pt to call medicaid and straighten this out so I could get the test prior approved or the test could not be performed. Pt states she understands.Molly Maduro [facility] CMA  September 11, 2009 5:15 PM

## 2010-10-29 NOTE — Letter (Signed)
Summary: Previsit letter  [facility]  [address] [place], [place] [address]   Phone: [phone]  Fax: [phone]       02/18/2010 MRN: [id number]  [facility] [address] [place], [place]  [address]  Dear Sheila Fernandez,  Welcome to the Gastroenterology Division at [facility].    You are scheduled to see a nurse for your pre-procedure visit on 03-13-10 at 10am on the 3rd floor at [facility], [address]  We ask that you try to arrive at our office 15 minutes prior to your appointment time to allow for check-in.  Your nurse visit will consist of discussing your medical and surgical history, your immediate family medical history, and your medications.    Please bring a complete list of all your medications or, if you prefer, bring the medication bottles and we will list them.  We will need to be aware of both prescribed and over the counter drugs.  We will need to know exact dosage information as well.  If you are on blood thinners (Coumadin, Plavix, Aggrenox, Ticlid, etc.) please call our office today/prior to your appointment, as we need to consult with your physician about holding your medication.   Please be prepared to read and sign documents such as consent forms, a financial agreement, and acknowledgement forms.  If necessary, and with your consent, a friend or relative is welcome to sit-in on the nurse visit with you.  Please bring your insurance card so that we may make a copy of it.  If your insurance requires a referral to see a specialist, please bring your referral form from your primary care physician.  No co-pay is required for this nurse visit.     If you cannot keep your appointment, please call [phone] to cancel or reschedule prior to your appointment date.  This allows [place] the opportunity to schedule an appointment for another patient in need of care.    Thank you for choosing [facility] for your medical  needs.  We appreciate the opportunity to care for you.  Please visit [place] at our website  to learn more about our practice.                     Sincerely.                                                                                                                   The Gastroenterology Division

## 2010-10-29 NOTE — Miscellaneous (Signed)
Summary: Consent Colposcopy  Consent Colposcopy   Imported By: Clydell Hakim 11/06/2009 15:53:43  _____________________________________________________________________  External Attachment:    Type:   Image     Comment:   External Document

## 2010-10-29 NOTE — Progress Notes (Signed)
Summary: resch appt  Phone Note Call from Patient Call back at Home Phone [phone]   Caller: Patient Summary of Call: tried to call to resch appt for tyroid [place] and they told her that we would have to call to resch - could be there at 8:30 on Thursday Initial call taken by: De Nurse,  September 14, 2009 11:54 AM  Follow-up for Phone Call        Rescheduled Thyroid US on 09/20/2009 @ 1 pm with [facility]. Order and prior auth. faxed. Called and lmom notifying pt of appt.Molly Maduro [facility] CMA  September 18, 2009 9:06 AM

## 2010-10-29 NOTE — Progress Notes (Signed)
Summary: Ultrasound  Phone Note Call from Patient Call back at Home Phone [phone]   Reason for Call: Talk to Doctor Summary of Call: would like MD to call her re: letter about getting a pelvic ultrasound Initial call taken by: Knox Royalty,  December 13, 2009 10:01 AM  Follow-up for Phone Call        Spoke with pt, explained why Dr Jennette Kettle ordered Ultra sound after she left from her appt, Voiced understnading, notified that ins sent approval, she is overdue for appt with PCP and will sched appt for that as well.  sent note to PCP and Dr Jennette Kettle for their information Follow-up by: Gladstone Pih,  December 14, 2009 9:03 AM

## 2010-10-29 NOTE — Assessment & Plan Note (Signed)
Summary: HTN f/u and insomnia    Vital Signs:  Patient profile:   60 year old female Height:      63.75 inches Weight:      172 pounds BMI:     29.86 Pulse rate:   71 / minute BP sitting:   135 / 92  (right arm)  Vitals Entered By: Arlyss Repress CMA, (August 09, 2009 2:16 PM) CC: f/up HTN. Is Patient Diabetic? No Pain Assessment Patient in pain? no        Primary Care Provider:  Eustaquio Boyden  MD  CC:  f/up HTN.[name]  History of Present Illness: CC: f/u htn, insomnia  1. HTN - last visit increased ACEI to 20mg  in combo pill.  Pt states BPs have been elevated at home - 140-160 systolic.  No CP/tightness.  Compliant with meds.  Has started trying to walk more - 30 min at a time but not daily yet.  2. insomnia - states has been having trouble sleeping for several years now.  Trouble both with falling asleep (tosses and turns) and staying asleep (states wakes up after 30 min, max sleep is 3 hours then wakes up).  This happens nightly.  Seems to have good sleep hygiene - tries to sleep at same time each night - 10:30pm, does not watch tv or radio at night, tries to eat at least 2 hours prior to bedtime, no coffee/caffeine at night.  Drinks glass of water prior to bedtime.  Has been prescribed sleeping aids in past but they make her have nightmares and sleep worse.    preventative - needs mammo and pap smear.  Habits & Providers  Alcohol-Tobacco-Diet     Tobacco Status: quit > 6 months     Year Started: 11/2003  Current Medications (verified): 1)  Aspirin 81 Mg Chew (Aspirin) 2)  One-A-Day Womens Formula  Tabs (Multiple Vitamins-Calcium) 3)  Lisinopril-Hydrochlorothiazide 20-25 Mg Tabs (Lisinopril-Hydrochlorothiazide) .... Take One Daily 4)  Flexeril 10 Mg Tabs (Cyclobenzaprine Hcl) .... Take One By Mouth Two Times A Day As Needed Muscle Spasm  Allergies (verified): 1)  ! Codeine  Past History:  Past medical, surgical, family and social histories (including risk factors)  reviewed for relevance to current acute and chronic problems.  Past Medical History: Reviewed history from 08/02/2009 and no changes required. h/o anemia, resolved with menopause hospitalized 05/21/01 -palpitations/CP  h/o uterine fibroids and adenomyosis  Past Surgical History: Reviewed history from 08/02/2009 and no changes required. adenosine cardiolite-nml, EF 66% - 04/29/2001 c-section - 09/30/1991 lap chole - 03/30/1999 lumpectomy 1994- benign Endometrial bx 03/2007 - WNL, scant benign endometrial mucosa, no atypia Endometrial bx 06/2009 - WNL, scant atrophic endometrium, no atypia  Family History: Reviewed history from 11/26/2006 and no changes required. Brother died at 66 yo with DM, HTN, kidney dz., father died at 50 with CVA/ prostate ca, HTN, and arthritis, mother died age 49 of pancreatic ca.  Had HTN, Sister died at age 63of cancer, another sister died at age 34 of cancer. Uterine fibroids run in family.  Social History: Reviewed history from 04/13/2009 and no changes required. Laid off 1/02 from [employer] after an injury.  Quit smoking 04/29/01.  working again as of visit 7/2010Smoking Status:  quit > 6 months  Physical Exam  General:  Well-developed,well-nourished,in no acute distress; alert,appropriate and cooperative throughout examination Lungs:  Normal respiratory effort, chest expands symmetrically. Lungs are clear to auscultation, no crackles or wheezes. Heart:  Normal rate and regular rhythm. S1 and  S2 normal without gallop, murmur, click, rub or other extra sounds. Extremities:  tr edema   Impression & Recommendations:  Problem # 1:  HYPERTENSION, BENIGN SYSTEMIC (ICD-401.1) increase HCTZ to 25.  checked BMP today.  RTC 2-4 wks for f/u.  Her updated medication list for this problem includes:    Lisinopril-hydrochlorothiazide 20-25 Mg Tabs (Lisinopril-hydrochlorothiazide) .[name]... Take one daily  Orders: Basic Met-[facility] ([address]-60454) [facility]- Est  Level 4  (09811)  Problem # 2:  INSOMNIA UNSPECIFIED (ICD-780.52) diagnostic NPSG.  discussed sleep hygeine whic hpatient seems to have good understanding of.  Orders: Sleep Disorder Referral (Sleep Disorder) [facility]- Est  Level 4 (91478)  Problem # 3:  FATIGUE (ICD-780.79) insomnia likely contributing  Complete Medication List: 1)  Aspirin 81 Mg Chew (Aspirin) 2)  One-a-day Womens Formula Tabs (Multiple vitamins-calcium) 3)  Lisinopril-hydrochlorothiazide 20-25 Mg Tabs (Lisinopril-hydrochlorothiazide) .... Take one daily 4)  Flexeril 10 Mg Tabs (Cyclobenzaprine hcl) .... Take one by mouth two times a day as needed muscle spasm  Other Orders: Mammogram (Screening) (Mammo)  Patient Instructions: 1)  Return in 3-4 wks for complete physical 2)  New blood pressure medicine script sent in - increase in HCTZ component of combo pill. 3)  We checked blood work today. 4)  We will set you up with a sleep study. 5)  Have a mammogram set up. 6)  Pap smear at next visit. Prescriptions: LISINOPRIL-HYDROCHLOROTHIAZIDE 20-25 MG TABS (LISINOPRIL-HYDROCHLOROTHIAZIDE) take one daily  #31 x 3   Entered and Authorized by:   Eustaquio Boyden  MD   Signed by:   Eustaquio Boyden  MD on 08/09/2009   Method used:   Electronically to        [facility] [address] (retail)       [phone]       [place], [place]  [address]       Ph: [phone]       Fax: [phone]   RxID:   [id number]    Prevention & Chronic Care Immunizations   Influenza vaccine: Fluvax Non-MCR  (07/19/2009)    Tetanus booster: 08/29/2001: Done.   Tetanus booster due: 08/30/2011    Pneumococcal vaccine: Not documented  Colorectal Screening   Hemoccult: Done.  (08/29/2006)    Colonoscopy: Not documented  Other Screening   Pap smear: Done.  (08/29/2006)    Mammogram: Done.  (10/30/2005)   Mammogram action/deferral: Ordered  (08/09/2009)   Smoking status: quit > 6 months   (08/09/2009)  Lipids   Total Cholesterol: 162  (12/09/2007)   LDL: 90  (12/09/2007)   LDL Direct: Not documented   HDL: 49  (12/09/2007)   Triglycerides: 114  (12/09/2007)  Hypertension   Last Blood Pressure: 135 / 92  (08/09/2009)   Serum creatinine: 0.79  (07/19/2009)   Serum potassium 3.8  (07/19/2009)    Hypertension flowsheet reviewed?: Yes   Progress toward BP goal: Improved  Self-Management Support :   Personal Goals (by the next clinic visit) :      Personal blood pressure goal: 140/90  (08/09/2009)   Patient will work on the following items until the next clinic visit to reach self-care goals:     Medications and monitoring: take my medicines every day, check my blood pressure  (08/09/2009)    Hypertension self-management support: Not documented    Hypertension self-management support not done because: Good outcomes  (08/09/2009)   Nursing Instructions: Schedule screening mammogram (see order)

## 2010-10-29 NOTE — Consult Note (Signed)
Summary: [facility]   Imported By: Knox Royalty 12/23/2007 09:29:33  _____________________________________________________________________  External Attachment:    Type:   Image     Comment:   External Document

## 2010-10-29 NOTE — Progress Notes (Signed)
Summary: [place] request  Phone Note Call from Patient Call back at [phone]   Reason for Call: Talk to Nurse Summary of Call: Pt states she feels like she has the flu and would like to speak with an rn about it. Initial call taken by: Haydee Salter,  July 19, 2007 4:46 PM  Follow-up for Phone Call        stuffy head. low grade fevr. more concerned about her shoulder still hurting appt made with pcp to f/u on that. meanwhil;e to use otc for symptomatic relief & drink plenty of water. pt agreed with plan Follow-up by: Golden Circle RN,  July 19, 2007 4:49 PM

## 2010-10-29 NOTE — Assessment & Plan Note (Signed)
Summary: back pain/ab   Vital Signs:  Patient Profile:   60 Years Old Female Height:     63.75 inches Weight:      167.7 pounds BMI:     29.12 Temp:     98.2 degrees F oral Pulse rate:   63 / minute BP sitting:   156 / 100  (left arm)  Pt. in pain?   yes    Location:   back    Intensity:   7  Vitals Entered By: Garen Grams LPN (November 04, 2007 11:21 AM)                  Chief Complaint:  pain in mid back.  History of Present Illness: 60 yo F recently seen in ER and dx with muscle strain c/o conitineud pain.  pain occured after falling down.  has had pain there daily each am x 1 month and then stays since fell.  in left side of back.. worse with movement of arm/shifting positions.  causes her breath to catch.  n/c with meals.  Ibuprofen shelped some, flexeril has not helpeed.        Physical Exam  General:     Well-developed,well-nourished,in no acute distress; alert,appropriate and cooperative throughout examination Msk:     no pain over vertebrae.  pain over rhomboids, no subscapular pain.  pain can come on with flexion/extension of arm, abduction.  str 5/5 at bilateral shoulders.    Impression & Recommendations:  Problem # 1:  BACK STRAIN (ICD-847.9) Assessment: New will try naprosyn for pain.  discussed can last a long time.  see pt instructions. Orders: [facility]- Est Level  3 ([address]109)   Complete Medication List: 1)  Bayer Aspirin 325 Mg Tabs (Aspirin) .... Take 1 tablet by mouth once a day 2)  Protonix 40 Mg Tbec (Pantoprazole sodium) .... Take 1 tablet by mouth once a day 3)  Toprol Xl 100 Mg Tb24 (Metoprolol succinate) .... Take 1 tablet by mouth once a day 4)  Flexeril 10 Mg Tabs (Cyclobenzaprine hcl) .... Take one before bed as needed for pain 5)  Naprosyn 250 Mg Tabs (Naproxen) .... 2 by mouth x1 then 1 by mouth every 8 hours as needed   Patient Instructions: 1)  Muscle strain - "rhomboids" - Heat - heating pad at least 3/day.  Rest - try  not to carry heavy things on that side.  Naprosyn.  This may take awhile to recover.  Make an appointment to see me in 2 weeks and we'll make sure you are getting better. 2)  Your blood pressure is also high today - perhaps from pain.  Let's make sure this goes down again in two week or else discuss what to do next.    Prescriptions: VICODIN 5-500 MG  TABS (HYDROCODONE-ACETAMINOPHEN) 1-2 by mouth q4-6 prn  #30 x 0   Entered and Authorized by:   Rolm Gala MD   Signed by:   Rolm Gala MD on 11/04/2007   Method used:   Print then Give to Patient   RxID:   [id number] NAPROSYN 250 MG  TABS (NAPROXEN) 2 by mouth x1 then 1 by mouth every 8 hours as needed  #30 x 1   Entered and Authorized by:   Rolm Gala MD   Signed by:   Rolm Gala MD on 11/04/2007   Method used:   Print then Give to Patient   RxID:   [id number] NAPROSYN 250 MG  TABS (NAPROXEN) 2  by mouth x1 then 1 by mouth every 8 hours as needed  #30 x 1   Entered and Authorized by:   Rolm Gala MD   Signed by:   Rolm Gala MD on 11/04/2007   Method used:   Print then Give to Patient   RxID:   [id number]  ]

## 2010-10-29 NOTE — Consult Note (Signed)
Summary: [facility]   Imported By: Knox Royalty 12/31/2007 11:19:56  _____________________________________________________________________  External Attachment:    Type:   Image     Comment:   External Document

## 2010-10-29 NOTE — Consult Note (Signed)
Summary: [facility] - stress test  [facility]   Imported By: Clydell Hakim 10/25/2009 15:35:02  _____________________________________________________________________  External Attachment:    Type:   Image     Comment:   External Document

## 2010-10-29 NOTE — Letter (Signed)
Summary: Generic Letter  [facility]  [address]   [place], [place] [address]   Phone: [phone]  Fax: [phone]    07/26/2007 MRN: [id number]  [address] [place], [place]  [address]  To Whom It May Concern,  Ms. Burkley Kober has radiculopathy in her neck and shoulder.  Raising her arm above her head and certain other movement with her arm will make it worse.  Ms. Yurick should not be doing activities that stress her neck/arm.  If she cannot modify her activity, please allow her to continue to learn by observation.     Sincerely,      Rolm Gala MD [facility]

## 2010-10-29 NOTE — Progress Notes (Signed)
Summary: phn msg  Phone Note From Other Clinic Call back at [phone]   Caller: [facility] Summary of Call: Recieved release asking for information on pt.  But was sent one previously and at that time sent all records that they had. Initial call taken by: Clydell Hakim,  November 01, 2009 2:48 PM  Follow-up for Phone Call        we did not receive information. called and request to send it again. Follow-up by: Arlyss Repress CMA,,  November 01, 2009 5:00 PM

## 2010-10-29 NOTE — Progress Notes (Signed)
Summary: triage  Phone Note Call from Patient Call back at [phone]   Caller: Patient Summary of Call: pt having some problems wants to be seen today. Initial call taken by: Clydell Hakim,  April 13, 2009 11:05 AM  Follow-up for Phone Call        c/o L side back pain. worse with coughing or using stairs. states ibuprofen is not helping. workin at [employer] today. she is at work & cannot come in sooner Follow-up by: Golden Circle RN,  April 13, 2009 11:14 AM  Additional Follow-up for Phone Call Additional follow up Details #1::        thanks.  never met pt. Additional Follow-up by: Eustaquio Boyden  MD,  April 13, 2009 12:16 PM

## 2010-10-29 NOTE — Progress Notes (Signed)
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 Initial call taken by: ERIN LEVAN,  July 23, 2007 4:10 PM  Follow-up for Phone Call        Wrote letter.  She can pick it up anytime.   Follow-up by: Rolm Gala MD,  July 26, 2007 8:44 AM  Additional Follow-up for Phone Call Additional follow up Details #1::        LMOVM that letter ready for pick-up Additional Follow-up by: [facility] CMA,  July 26, 2007 10:57 AM

## 2010-10-29 NOTE — Progress Notes (Signed)
Summary: [place] request  Phone Note Call from Patient Call back at Home Phone [phone]   Reason for Call: Talk to Nurse Summary of Call: pt would like to speak with a rn about pain in shoulder and neck Initial call taken by: Haydee Salter,  December 16, 2006 11:55 AM  Follow-up for Phone Call         hx htn, has been hospitalized for cp before.  Now pain in upper back, l shoulder & neck off & on x 2 days  Now c/o nausea. dizzy. taking meds for HTN. Follow-up by: Golden Circle RN,  December 16, 2006 11:58 AM   sent to er for evaluation

## 2010-10-29 NOTE — Miscellaneous (Signed)
Summary: ultrasound approved  Clinical Lists Changes medsolutions approved. #[id number].Golden Circle RN  September 13, 2009 8:39 AM  Appended Document: ultrasound approved Thyroid ultrasound has been approved. Order and prior approval faxed. Pt canceled appt today due to weather. Called and instructed pt to call and reschedule appt. Tessie Fass, CMA

## 2010-10-29 NOTE — Progress Notes (Signed)
Summary: phn msg  Phone Note Call from Patient Call back at [facility] Phone [phone]   Caller: Patient Summary of Call: Pt called Medicaid office and they told her the number on her card is correct.  Pt is supposed to have ultrasound tomorrow and it needed preapproval. Initial call taken by: Clydell Hakim,  September 12, 2009 4:44 PM  Follow-up for Phone Call        prior approval was faxed to [facility] radiology. pt canceled appt today due to weather. called and instructed pt to reschedule. Follow-up by: Tessie Fass CMA,  September 13, 2009 11:18 AM

## 2010-10-29 NOTE — Miscellaneous (Signed)
Summary: non-ob US approved  Clinical Lists Changes medsolutions approved the non-ob ultrasound #[id number].Golden Circle RN  December 19, 2009 11:41 AM

## 2010-10-29 NOTE — Assessment & Plan Note (Signed)
Summary: f/up,tcb   Vital Signs:  Patient profile:   60 year old female Weight:      176 pounds Pulse rate:   87 / minute BP sitting:   137 / 87  (left arm)  Vitals Entered By: Renato Battles slade,cma CC: blood work. c/o back pain. Is Patient Diabetic? No Pain Assessment Patient in pain? yes     Location: lower back Intensity: 8 Onset of pain  Chronic   Primary Care Provider:  Eustaquio Boyden  MD  CC:  blood work. c/o back pain.Sheila Fernandez  History of Present Illness: CC: f/u issues  1. back pain - continuous for about 2 yrs now.  L subscapular pain that goes to mid chest.  Had fall 2 years back, then back pain started.  Squeezing pain.  has difficulty characterizing pain.  Recently saw her cardiologist, Dr. Deborah Chalk, who did normal stress test.  2. coughs up thick flegm every morning - taking mucinex.  No h/o asthma.  ex-smoker (quit 11/2003).   ~10 PY hx.  3. HTN - compliant with meds.  no HA, vision changes, chest pain, tightness, urinary changes, LE swelling.  Saw Dr. Deborah Chalk at [facility], told everything looking good overall.  4. recently w/u for postmenopausal bleeding- [place] (see chart).  Discussed with Dr. Jennette Kettle.  To call us if has any more bleeding or pelvic pain then will refer to Gyn.  Habits & Providers  Alcohol-Tobacco-Diet     Tobacco Status: quit     Year Quit: 2005     Pack years: 32  -  Date:  11/01/2009    PAP COLPO normal  Current Medications (verified): 1)  Aspirin 81 Mg Chew (Aspirin) 2)  One-A-Day Womens Formula  Tabs (Multiple Vitamins-Calcium) 3)  Lisinopril-Hydrochlorothiazide 20-25 Mg Tabs (Lisinopril-Hydrochlorothiazide) .... Take One Daily 4)  Metoprolol Tartrate 25 Mg Tabs (Metoprolol Tartrate) .... Take One By Mouth Two Times A Day 5)  Nitrostat 0.4 Mg Subl (Nitroglycerin) .... Take One Sl Q28min X 3 As Needed Chest Pain.  Call Doctor If Used. 6)  Amlodipine Besylate 5 Mg Tabs (Amlodipine Besylate) .... One By Mouth Daily  Allergies (verified): 1)  !  Codeine  Past History:  Past medical, surgical, family and social histories (including risk factors) reviewed for relevance to current acute and chronic problems.  Past Medical History: HTN h/o anemia, resolved with menopause hospitalized 05/21/01 -palpitations/CP 2.6cm posterior uterine fibroid  h/o postmenopausal bleed with 13mm stripe on [place] 2008, "scant" bx normal adenomyosis  Past Surgical History: adenosine cardiolite-nml, EF 66% - 04/29/2001 c-section - 09/30/1991 lap chole - 03/30/1999 lumpectomy 1994- benign Endometrial bx 03/2007 - WNL, scant benign endometrial mucosa, no atypia Endometrial bx 06/2009 - WNL, scant atrophic endometrium, no atypia ASCUS PAP 08/2009, colpo 10/2009 WNL, rpt 1 year. Sleep study 08/2009 - WNL Thyroid US done for ? of nodule on exam - R microadenoma, WNL 09/2009 Cardiolite, exercise done for chest pain - WNL, nl LVEF, no ST-T changes, no wall motion abnl 09/2009 TVS 11/2009 - uterine fibroid and ?adenomyosis  Family History: Reviewed history from 11/26/2006 and no changes required. Brother died at 58 yo with DM, HTN, kidney dz., father died at 29 with CVA/ prostate ca, HTN, and arthritis, mother died age 56 of pancreatic ca.  Had HTN, Sister died at age 72of cancer, another sister died at age 13 of cancer. Uterine fibroids run in family.  Social History: Reviewed history from 04/13/2009 and no changes required. Laid off 1/02 from [employer] after an  injury.  Quit smoking 04/29/01.  working again as of visit 7/2010Smoking Status:  quit  Physical Exam  General:  Well-developed,well-nourished,in no acute distress; alert,appropriate and cooperative throughout examination Chest Wall:  tender to palpation R chest Lungs:  Normal respiratory effort, chest expands symmetrically. Lungs are clear to auscultation.  crackles bibasilarly that clear with cough. Heart:  Normal rate and regular rhythm. S1 and S2 normal without gallop, murmur, click, rub or other extra  sounds. Extremities:  tr edema Skin:  Intact without suspicious lesions or rashes   Impression & Recommendations:  Problem # 1:  HYPERTENSION, BENIGN SYSTEMIC (ICD-401.1)  stable on 3 meds.  continue. Her updated medication list for this problem includes:    Lisinopril-hydrochlorothiazide 20-25 Mg Tabs (Lisinopril-hydrochlorothiazide) .Sheila Fernandez... Take one daily    Metoprolol Tartrate 25 Mg Tabs (Metoprolol tartrate) .Sheila Fernandez... Take one by mouth two times a day    Amlodipine Besylate 5 Mg Tabs (Amlodipine besylate) ..... One by mouth daily  BP today: 137/87 Prior BP: 143/81 (11/01/2009)  Labs Reviewed: K+: 3.8 (08/09/2009) Creat: : 0.80 (08/09/2009)   Chol: 161 (10/16/2009)   HDL: 49 (12/09/2007)   LDL: 91 (10/16/2009)   TG: 130 (10/16/2009)  Orders: [facility]- Est  Level 4 ([address])  Problem # 2:  FIBROIDS, UTERUS (ICD-218.9)  fibroids in uterus likely responsible for previous bleed.  advised patient to report further bleeds and if that happens, will refer to GYN for eval for hysterectomy or hysteroscopy.  Orders: [facility]- Est  Level 4 ([address])  Problem # 3:  Preventive Health Care (ICD-V70.0) colonoscopy set up today.  Complete Medication List: 1)  Aspirin 81 Mg Chew (Aspirin) 2)  One-a-day Womens Formula Tabs (Multiple vitamins-calcium) 3)  Lisinopril-hydrochlorothiazide 20-25 Mg Tabs (Lisinopril-hydrochlorothiazide) .... Take one daily 4)  Metoprolol Tartrate 25 Mg Tabs (Metoprolol tartrate) .... Take one by mouth two times a day 5)  Nitrostat 0.4 Mg Subl (Nitroglycerin) .... Take one sl q45min x 3 as needed chest pain.  call doctor if used. 6)  Amlodipine Besylate 5 Mg Tabs (Amlodipine besylate) .... One by mouth daily  Other Orders: Colonoscopy (Colon)  Patient Instructions: 1)  Please return in 3 months for follow up. 2)  Screening colonoscopy. 3)  Good to see you today.  Continue blood pressure meds as up to now.  Your blood pressure is doing well today. 4)  Call clinic with  questions. Prescriptions: AMLODIPINE BESYLATE 5 MG TABS (AMLODIPINE BESYLATE) one by mouth daily  #30 x 3   Entered and Authorized by:   Eustaquio Boyden  MD   Signed by:   Eustaquio Boyden  MD on 12/21/2009   Method used:   Electronically to        [facility] [address] (retail)       [phone]       [place], [place]  [address]       Ph: [phone]       Fax: [phone]   RxID:   [id number] METOPROLOL TARTRATE 25 MG TABS (METOPROLOL TARTRATE) take one by mouth two times a day  #60 x 3   Entered and Authorized by:   Eustaquio Boyden  MD   Signed by:   Eustaquio Boyden  MD on 12/21/2009   Method used:   Electronically to        [facility] [address] (retail)       [phone]       [facility]  [place], [place]  [address]       Ph: [phone]       Fax: [phone]   RxID:   [id number] LISINOPRIL-HYDROCHLOROTHIAZIDE 20-25 MG TABS (LISINOPRIL-HYDROCHLOROTHIAZIDE) take one daily  #31 x 3   Entered and Authorized by:   Eustaquio Boyden  MD   Signed by:   Eustaquio Boyden  MD on 12/21/2009   Method used:   Electronically to        [facility] [address] (retail)       [phone]       [place], [place]  [address]       Ph: [phone]       Fax: [phone]   RxID:   [id number]    Prevention & Chronic Care Immunizations   Influenza vaccine: Fluvax Non-MCR  (07/19/2009)    Tetanus booster: 08/29/2001: Done.   Tetanus booster due: 08/30/2011    Pneumococcal vaccine: Not documented  Colorectal Screening   Hemoccult: Done.  (08/29/2006)    Colonoscopy: Not documented   Colonoscopy action/deferral: GI Referral  (12/21/2009)  Other Screening   Pap smear: COLPO normal  (11/01/2009)   Pap smear due: 11/01/2010    Mammogram: ASSESSMENT: Negative - BI-RADS 1^MM DIGITAL SCREENING  (10/18/2009)   Mammogram action/deferral: Ordered  (08/09/2009)   Mammogram  due: 10/18/2010   Smoking status: quit  (12/21/2009)  Lipids   Total Cholesterol: 161  (10/16/2009)   LDL: 91  (10/16/2009)   LDL Direct: Not documented   HDL: 49  (12/09/2007)   Triglycerides: 130  (10/16/2009)  Hypertension   Last Blood Pressure: 137 / 87  (12/21/2009)   Serum creatinine: 0.80  (08/09/2009)   Serum potassium 3.8  (08/09/2009)    Hypertension flowsheet reviewed?: Yes   Progress toward BP goal: At goal  Self-Management Support :   Personal Goals (by the next clinic visit) :      Personal blood pressure goal: 140/90  (08/09/2009)   Hypertension self-management support: BP self-monitoring log, Written self-care plan, Pre-printed educational material  (09/10/2009)    Hypertension self-management support not done because: Good outcomes  (12/21/2009)   Nursing Instructions: Screening colonoscopy ordered

## 2010-10-29 NOTE — Miscellaneous (Signed)
Summary: [place] approved  Clinical Lists Changes transvag US approved #[id number].Golden Circle RN  December 10, 2009 3:51 PM

## 2010-10-29 NOTE — Letter (Signed)
Summary: Results Letter  [facility]  [address] [place], [place] [address]   Phone: [phone]  Fax: [phone]        April 09, 2010 MRN: [id number]    [facility] [address] [place], [place]  [address]    Dear Ms. Nevers,   Good news.  The polyps that were removed during your recent procedure were NOT pre-cancerous.  You should continue to follow current colorectal cancer screening guidelines with a repeat colonoscopy in 10 years.  We will therefore put your information in our reminder system and will contact you in 10 years to schedule a repeat procedure.  Please call if you have any questions or concerns.       Sincerely,  Rachael Fee MD  This letter has been electronically signed by your physician.  Appended Document: Results Letter letter mailed.

## 2010-10-29 NOTE — Progress Notes (Signed)
Summary: prep ?  Phone Note Call from Patient Call back at [facility] Phone [phone]   Caller: Patient Call For: Dr. Christella Hartigan Reason for Call: Talk to Nurse Summary of Call: prep ? Initial call taken by: Vallarie Mare,  April 04, 2010 4:33 PM  Follow-up for Phone Call        Pt questioned if ok to take Amlodipine tomorrow, states she usually takes it with milk or yogart.  Writer stated ok for pt to take Amlodipine with clear liquids in the a.m.  Understanding voiced. Follow-up by: Karl Bales RN,  April 04, 2010 4:40 PM

## 2010-10-29 NOTE — Assessment & Plan Note (Signed)
Summary: colpo per gutierrez/eo   Vital Signs:  Patient profile:   60 year old female Height:      63.75 inches Weight:      172.2 pounds BMI:     29.90 Temp:     98.4 degrees F oral Pulse rate:   172 / minute BP sitting:   143 / 81  (left arm) Cuff size:   regular  Vitals Entered By: Gladstone Pih (November 01, 2009 8:42 AM) CC: Colpo Is Patient Diabetic? No Pain Assessment Patient in pain? no        Primary Care Provider:  Eustaquio Boyden  MD  CC:  Colpo.  History of Present Illness: ASCUS on pap with negative hi risk HPV but Dr Reece Agar saw a cervical lesion  pmh postmenopausal bleeding--US showed 13 mm stripe (October 2010). she had had a fairly klarge amt of bleeding in Octobe--her endometrial bx was scant fragments but negative. She has subsequently had small amt of bleeding in January. She also reports hx of [place] in 2008  that showed adenomyosis by her report  Habits & Providers  Alcohol-Tobacco-Diet     Tobacco Status: never  Allergies: 1)  ! Codeine  Social History: Smoking Status:  never  Physical Exam  Genitalia:  normal introitus, no external lesions, no vaginal discharge, and mucosa pink and moist.  small papular lesion at 6:00 on cervix. Additional Exam:  Patient given informed consent, signed copy in the chart. Placed in lithotomy position. Cervix viewed with speculum and colposcope. Was the entire squamocolumnar junction seen? yes Any acetowhite lesions noted?no the lesion at 6;00 did notshow any acetowhite change with acetic acid or with lugols Any abnormalities seen with green filter?no Any abnormalities seen with application of Lugol's solution?no Was the endocervical canal sampled?no Were any cervical biopsies taken?yes at 6:00 Were there any complications?no COMMENTS: Patient was given post procedure instructions. We will notify her of any results.    Impression & Recommendations:  Problem # 1:  LESION, CERVIX (ICD-236.3)  Orders: Colposcopy  w/ biopsy - [facility] ([address])  Problem # 2:  POSTMENOPAUSAL BLEEDING (ICD-627.1)  Orders: Ultrasound (Ultrasound) [facility]- Est Level  3 ([address]) after review of her notes, I think the 13 mm stripe w a scant endometrial biopsy and subsequent bleeding is a little worrisome. had her sign ROI for her 2008 Korea and we scheduled her for [place] in March or April which will be 6 m after [employer]. If she still has large stripe and tehre is no evidence on her 2008 [place] of adenomyosis--or if thatstripe was small--OR if she has subsequent vaginal bleeding I woudl recommend repeat endometrial bx. We discussed this with her at length and she is in agreement  Complete Medication List: 1)  Aspirin 81 Mg Chew (Aspirin) 2)  One-a-day Womens Formula Tabs (Multiple vitamins-calcium) 3)  Lisinopril-hydrochlorothiazide 20-25 Mg Tabs (Lisinopril-hydrochlorothiazide) .... Take one daily 4)  Flexeril 10 Mg Tabs (Cyclobenzaprine hcl) .... Take one by mouth two times a day as needed muscle spasm 5)  Metoprolol Tartrate 25 Mg Tabs (Metoprolol tartrate) .... Take one by mouth two times a day 6)  Nitrostat 0.4 Mg Subl (Nitroglycerin) .... Take one sl q46min x 3 as needed chest pain.  call doctor if used. 7)  Amlodipine Besylate 5 Mg Tabs (Amlodipine besylate) .... One by mouth daily 8)  Hydromet 5-1.5 Mg/83ml Syrp (Hydrocodone-homatropine) .... Take one teaspoon three times a day as needed cough, qs 2 wks 9)  Mucinex 600 Mg Xr12h-tab (Guaifenesin) .Marland KitchenMarland KitchenMarland Kitchen  Take one by mouth two times a day as needed cough - mucolytic

## 2010-10-29 NOTE — Miscellaneous (Signed)
Summary: Re. Ultrasound appointment  Clinical Lists Changes  received call from Tobi Bastos at [facility] radiology. patient has rescheduled her ultrasound twice. now the PA has run out as of today. will need prior authorization again. appointment has been rescheduled  for 10/24/2009. will  send.  message to red team . Theresia Lo RN  October 12, 2009 4:11 PM   [place] has been prior approved. Authorization number is [id number]. Order and prior auth faxed to [facility]. Called pt and advised her that she needs to keep this appt on 10/24/2009 after missing the first two. Explained to her that we would not be doing anymore prior authorizations for this procedure.Tessie Fass CMA  October 15, 2009 3:42 PM

## 2010-10-29 NOTE — Miscellaneous (Signed)
Summary: Consent Endometrial Biopsy  Consent Endometrial Biopsy   Imported By: Clydell Hakim 07/17/2009 16:37:40  _____________________________________________________________________  External Attachment:    Type:   Image     Comment:   External Document

## 2010-10-29 NOTE — Assessment & Plan Note (Signed)
Summary: shoulder still hurting   Vital Signs:  Patient Profile:   60 Years Old Female Weight:      166 pounds Temp:     98.0 degrees F Pulse rate:   79 / minute BP sitting:   161 / 104  (left arm)  Pt. in pain?   yes    Location:   left shoulder and neck    Intensity:   7    Type:       aching  Vitals Entered ByJacki Cones RN (July 21, 2007 1:51 PM)                  Chief Complaint:  left shoulder pain and and elevated bp.  History of Present Illness: Pain in neck, shoulders, back L>R x 3 weeks.  She does not recall any exertion or injury that may have caused pain.  She describes the pain as intermittantly sharp and dull.  The pain is often sharp in the muscles behind her neck and around her L shoulder blade and dull at her deltoid.  She also points to painlower on her back but on the left side.  She notes that the the pain sometimes seems to move to her right side but does not have a classical presentation of radiation. She has tried ibuprofen withl minimal releif and has not had relief with ice or heat; diclofenac annoyed her stoumach..  She notes no tingling, numbness, weakness.   Xrays with C5/C6 foraminal narrowing.  worse with dance.  She had a similar episode in past.  HTN: No SOB, CP, edmema.  Tolerating medications well.  Knows medication regiment.  No side effects.         Physical Exam  Msk:     normal neck rom.  no joint tenderness, no joint swelling, no joint warmth, no redness over joints, no joint deformities, and no joint instability.  pain over entire shoulder, neck, upper back on left side.      Impression & Recommendations:  Problem # 1:  CERVICAL RADICULOPATHY (ICD-723.4) Assessment: Deteriorated see pt instructions.  read xrays. Orders: [facility]- Est  Level 4 ([address])   Problem # 2:  HYPERTENSION, BENIGN SYSTEMIC (ICD-401.1) Assessment: Deteriorated see pt isntructionxs. Her updated medication list for this problem includes:    Toprol Xl  100 Mg Tb24 (Metoprolol succinate) .[name]... Take 1 tablet by mouth once a day  Orders: Basic Met-[facility] ([address]) [facility]- Est  Level 4 ([address])   Complete Medication List: 1)  Bayer Aspirin 325 Mg Tabs (Aspirin) .... Take 1 tablet by mouth once a day 2)  Protonix 40 Mg Tbec (Pantoprazole sodium) .... Take 1 tablet by mouth once a day 3)  Toprol Xl 100 Mg Tb24 (Metoprolol succinate) .... Take 1 tablet by mouth once a day 4)  Naprosyn 250 Mg Tabs (Naproxen) .... Take 2 pills initially and then take 1 pill every 8 hours as needed for pain 5)  Flexeril 10 Mg Tabs (Cyclobenzaprine hcl) .... Take one before bed as needed for pain   Patient Instructions: 1)  1. Blood pressure - cut back on salt.  will increase Lisinopril to 40 mg daily.  Come back in 4 weeks. 2)  2. Cholesterol - come back fasting in 4 weeks so we can check your cholesterol.  Try to get first appt in morning.   3)  3. Radiculopathy - Usually resolves in 1 year.  reasons to come back: if you start to get muscle weakness, if pain  gets worse.  diclofenac - to avoid annoying your stoumach, can take with prilosec otc or zantac.  dance - modified, don't raise your arms.      ]

## 2010-10-29 NOTE — Miscellaneous (Signed)
Summary: ultrasound approved  Clinical Lists Changes pelvic & transvaginal ultrasound approved #[id number].Golden Circle RN  July 16, 2009 12:29 PM

## 2010-10-29 NOTE — Miscellaneous (Signed)
Summary: [facility]  Clinical Lists Changes dnka her [place] of neck per radiology.Golden Circle RN  September 14, 2009 3:30 PM  rescheduled [place] on 09/20/2009 @ 1 pm with [facility] Refaxed order.Tessie Fass CMA  September 14, 2009 5:23 PM

## 2010-10-29 NOTE — Assessment & Plan Note (Signed)
Summary: fu/el   Vital Signs:  Patient Profile:   60 Years Old Female Height:     63.75 inches Weight:      167.0 pounds BMI:     29.00 Temp:     98.4 degrees F oral Pulse rate:   74 / minute BP sitting:   156 / 91  (left arm) Cuff size:   regular  Pt. in pain?   no  Vitals Entered By: Garen Grams LPN (December 09, 2007 11:32 AM)                  Chief Complaint:  f/u on bp.  History of Present Illness: 59 yo F:  HTN: No shortness of breath, chest pain, edema.  Tolerating medications well.  Taking metorpolol nonextended release 50mg  once daily.  No side effects.  bp high at home too.  ate chicken biscuit, philly cheesesteak, greens, and beef yesterday.  Getting some exercise, not much.  mother, father, brother all have htn.  chest pain - tightness in chest lasts for a few minutes every other to every 3rd day. no SOB, diaphoresis, nausea.   non exertional.  +HTN, former smoker (quit 02), last LDL was 60 (rechecking today), no early FHx.  Was admitted in 2002 for palpitations/chest pain.  Had ECHO with EF 66% per d/c summary (but actualy echo not in echart).  Got outpatient adenosine Cardiolite stress test for further risk stratification after the admssion but neither she nor echart can tell me where this was done.           Physical Exam  General:     Well-developed,well-nourished,in no acute distress; alert,appropriate and cooperative throughout examination Lungs:     Normal respiratory effort, chest expands symmetrically. Lungs are clear to auscultation, no crackles or wheezes. Heart:     Normal rate and regular rhythm. S1 and S2 normal without gallop, murmur, click, rub or other extra sounds.    Impression & Recommendations:  Problem # 1:  HYPERTENSION, BENIGN SYSTEMIC (ICD-401.1) Assessment: Deteriorated told patient to take non extended release twice daly.  f/u in 1 month to asses repeat blood pressure. The following medications were removed from the  medication list:    Toprol Xl 100 Mg Tb24 (Metoprolol succinate) .[name]... Take 1 tablet by mouth once a day  Her updated medication list for this problem includes:    Lisinopril 40 Mg Tabs (Lisinopril) .[name]... 1 by mouth daily    Metoprolol Tartrate 50 Mg Tabs (Metoprolol tartrate) .[name]... 1 by mouth bid  Orders: Comp Met-[facility] [id number]) Lipid-[facility] ([id number]) [facility]- Est  Level 4 ([address])   Problem # 2:  CHEST PAIN (ICD-786.50) Assessment: New will refer to cardiology for repeat stress test.  check FLP to further risk stratisfy.   Orders: Cardiology Referral (Cardiology) [facility]- Est  Level 4 ([address])   Complete Medication List: 1)  Bayer Aspirin 325 Mg Tabs (Aspirin) .... Take 1 tablet by mouth once a day 2)  Protonix 40 Mg Tbec (Pantoprazole sodium) .... Take 1 tablet by mouth once a day 3)  Flexeril 10 Mg Tabs (Cyclobenzaprine hcl) .... Take one before bed as needed for pain 4)  Naprosyn 250 Mg Tabs (Naproxen) .... 2 by mouth x1 then 1 by mouth every 8 hours as needed 5)  Lisinopril 40 Mg Tabs (Lisinopril) .[name].. 1 by mouth daily 6)  Metoprolol Tartrate 50 Mg Tabs (Metoprolol tartrate) .[name].. 1 by mouth bid     Prescriptions: METOPROLOL TARTRATE 50 MG  TABS (METOPROLOL TARTRATE)  1 by mouth bid  #68 x 11   Entered and Authorized by:   Rolm Gala MD   Signed by:   Rolm Gala MD on 12/09/2007   Method used:   Electronically sent to ...       [facility] [address] [id number]       [address]       [place], [place]  [address]       Ph: [phone]       Fax: [phone]   RxID:   [id number]  ]

## 2010-10-29 NOTE — Letter (Signed)
Summary: COLPO Letter  [facility]  [address]   [place], [place] [address]   Phone: [phone]  Fax: [phone]    11/02/2009  JONATHAN KIRKENDOLL [address] [place], [place]  [address]  Dear Ms. Adinolfi,  The cervical biopsy showed only some inflammation. This is good news. I would have your regular pap smear in a year.         Sincerely,   Denny Levy MD  Appended Document: COLPO Letter mailed.

## 2010-10-29 NOTE — Miscellaneous (Signed)
Summary: previsit/rm  Clinical Lists Changes  Medications: Added new medication of MOVIPREP 100 GM  SOLR (PEG-KCL-NACL-NASULF-NA ASC-C) As per prep instructions. - Signed Rx of MOVIPREP 100 GM  SOLR (PEG-KCL-NACL-NASULF-NA ASC-C) As per prep instructions.;  #1 x 0;  Signed;  Entered by: Sherren Kerns RN;  Authorized by: Rachael Fee MD;  Method used: Electronically to [facility] [phone] [place], [place], [place]  [address], Ph: [phone], Fax: [phone] Observations: Added new observation of ALLERGY REV: Done (03/22/2010 9:52)    Prescriptions: MOVIPREP 100 GM  SOLR (PEG-KCL-NACL-NASULF-NA ASC-C) As per prep instructions.  #1 x 0   Entered by:   Sherren Kerns RN   Authorized by:   Rachael Fee MD   Signed by:   Sherren Kerns RN on 03/22/2010   Method used:   Electronically to        [facility] [address] (retail)       [phone]       [place], [place]  [address]       Ph: [phone]       Fax: [phone]   RxID:   [id number]

## 2010-10-29 NOTE — Letter (Signed)
Summary: Generic Letter  [facility]  [address]   [place], [place] [address]   Phone: [phone]  Fax: [phone]    07/22/2007 MRN: [id number]  [address] [place], [place]  [address]  Dear Ms. Allcock,  Your electrolytes were normal.     Sincerely,      Rolm Gala MD [facility]

## 2010-10-29 NOTE — Assessment & Plan Note (Signed)
Summary: l sided back pain/   Vital Signs:  Patient profile:   60 year old female Height:      63.75 inches Weight:      171.6 pounds BMI:     29.79 Temp:     98 degrees F Pulse rate:   74 / minute BP sitting:   168 / 99  (left arm)  Vitals Entered By: Theresia Lo RN (April 13, 2009 3:17 PM) CC: pain left mid to upper back, HTN Is Patient Diabetic? No Pain Assessment Patient in pain? yes     Location: upper left back Intensity: 5 Type: jolting sensation   Primary Care Provider:  Eustaquio Boyden  MD  CC:  pain left mid to upper back and HTN.  History of Present Illness: back pain:  has history of pain in similar area.  hurts most when she moves positions from one to another or when she jolts the area in some way (for example taking a deep breath, coughing or going over a bump in the car).  had had xrays, etc in the past that were negative.  was treated as muscle spasm in the past with nsaid, muscle relaxant with final relief over time.  she is unsure what has triggered this at this time. it started approximately 2 days ago.  she feels most comfortable just staying in one position though that position doesn't matter.  she has tried rubbing it, tylenol and ibuprofen at home without relief.  she comes in today because it wasn't getting any better and she wanted to make sure it was evaluated.  HTN: hasn't been taking any medications as she hasn't had insurance. wasn't aware that several of her meds were on the $4 list. she had been trying to make some dietary changes (low salt, less sugar) to try to help so she was a little disappointed today when her blood pressure was so elevated.  she denies any chest pain, shortness of breath or leg edema.  Habits & Providers  Alcohol-Tobacco-Diet     Tobacco Status: quit  Current Medications (verified): 1)  Cyclobenzaprine Hcl 10 Mg Tabs (Cyclobenzaprine Hcl) .... 1/2 - 1 By Mouth Three Times A Day For Muscle Spasms 2)  Naproxen 500 Mg  Tabs (Naproxen) .[name].. 1 By Mouth Two Times A Day For Pain For 2 Weeks Then As Needed 3)  Lisinopril-Hydrochlorothiazide 10-12.5 Mg Tabs (Lisinopril-Hydrochlorothiazide) .[name].. 1 By Mouth Once Daily For Blood Pressure  Allergies (verified): 1)  ! Codeine PMH-FH-SH reviewed for relevance  Social History: Laid off 1/02 from [employer] after an injury.  Quit smoking 04/29/01.  working again as of visit 03/2009  Review of Systems       per HPI.  also denies any constipation, dysuria.    Physical Exam  General:  Well-developed,well-nourished,in no acute distress; alert,appropriate and cooperative throughout examination Lungs:  Normal respiratory effort, chest expands symmetrically. Lungs are clear to auscultation, no crackles or wheezes. Heart:  Normal rate and regular rhythm. S1 and S2 normal without gallop, murmur, click, rub or other extra sounds. Msk:  tenderness to palpation over paraspinous muscles on R around thoracic spine.  tenderness with rotation of back.  nontender over spine itself.  tension felt in paraspinous muscles.  Extremities:  tr edema   Impression & Recommendations:  Problem # 1:  BACK STRAIN (ICD-847.9) Assessment Deteriorated  unclear what set this one off but will treat relatively conservatively with rest, flexeril, nsaid for 2 or so weeks.  to call if  not better for more evalaution.    Orders: [facility]- Est  Level 4 ([address])  Problem # 2:  HYPERTENSION, BENIGN SYSTEMIC (ICD-401.1) Assessment: Deteriorated wasn't taking anything.  to help with affordability will [facility]4 list.  check bmet today.  would recheck in 1 month.  she is working on getting some coverage with debra hill or medicaid again to help cover it.   The following medications were removed from the medication list:    Metoprolol Tartrate 50 Mg Tabs (Metoprolol tartrate) .[name]... 1 by mouth bid Her updated medication list for this problem includes:     Lisinopril-hydrochlorothiazide 10-12.5 Mg Tabs (Lisinopril-hydrochlorothiazide) .[name]... 1 by mouth once daily for blood pressure  Orders: Basic Met-[facility] ([address]11) [facility]- Est  Level 4 ([address])  Complete Medication List: 1)  Cyclobenzaprine Hcl 10 Mg Tabs (Cyclobenzaprine hcl) .... 1/2 - 1 by mouth three times a day for muscle spasms 2)  Naproxen 500 Mg Tabs (Naproxen) .[name].. 1 by mouth two times a day for pain for 2 weeks then as needed 3)  Lisinopril-hydrochlorothiazide 10-12.5 Mg Tabs (Lisinopril-hydrochlorothiazide) .[name].. 1 by mouth once daily for blood pressure  Patient Instructions: 1)  Please follow up in 1 month with Dr Sharen Hones to see how your blood pressure and back pain are doing. 2)  Be sure to pick up the 3 prescriptions today.  They are all on the $4 list at [facility].   3)  If after 2 weeks your pain isn't any better, be sure to call for another appointment. 4)  We are going to check your kidney function today to make sure we have a baseline before restarting your blood pressure medication.  5)  Be sure to call Jaynee Eagles and the medicaid office to see if you can get some insurance coverage.  6)  It was nice to meet you today! Prescriptions: LISINOPRIL-HYDROCHLOROTHIAZIDE 10-12.5 MG TABS (LISINOPRIL-HYDROCHLOROTHIAZIDE) 1 by mouth once daily for blood pressure  #30 x 1   Entered and Authorized by:   Ancil Boozer  MD   Signed by:   Ancil Boozer  MD on 04/13/2009   Method used:   Print then Give to Patient   RxID:   [id number] NAPROXEN 500 MG TABS (NAPROXEN) 1 by mouth two times a day for pain for 2 weeks then as needed  #60 x 1   Entered and Authorized by:   Ancil Boozer  MD   Signed by:   Ancil Boozer  MD on 04/13/2009   Method used:   Print then Give to Patient   RxID:   [id number] CYCLOBENZAPRINE HCL 10 MG TABS (CYCLOBENZAPRINE HCL) 1/2 - 1 by mouth three times a day for muscle spasms  #30 x 1   Entered and Authorized by:   Ancil Boozer  MD   Signed by:    Ancil Boozer  MD on 04/13/2009   Method used:   Print then Give to Patient   RxID:   [id number]

## 2010-10-29 NOTE — Assessment & Plan Note (Signed)
Vital Signs:  Patient profile:   60 year old female Height:      63.75 inches Weight:      179 pounds BMI:     31.08 Temp:     98.3 degrees F oral Pulse rate:   72 / minute BP sitting:   146 / 90  (left arm) Cuff size:   regular  Vitals Entered By: Tessie Fass CMA (August 15, 2010 9:39 AM) CC: cough and foot pain Pain Assessment Patient in pain? yes     Location: head Intensity: 4   Primary Care Provider:  Clementeen Graham MD  CC:  cough and foot pain.  History of Present Illness: Sheila Fernandez is a pleasant 60 yo AA female with hx significant for htn, postmenopausal bleeding and lower back pain.  She presents to clinic with c/o persistent thick morning phlegm production for the past several years which seems to be getting worse, in addition to persistent cough and bilateral foot and ankle pain in the morning.  Htn: Todays bp 162/92 Currently on Lisonpril-HCT 25-20mg  and Metoprolol 25mg  once daily  H/o Postmenopausal beleeding: resolved per pt's report. No further bleeding since 1 episode in March.  Back pain: Stable per pt reports of occasional pain relieved with Ibuprofen and heating pad  Cough: Pt not sure if cough began when starting ACE-I but reports that is rather bothersome  Reports phlegm as thick and white sometimes feeling stuck in her throat Has used warm air such as humidifier which seems to help somewhat  Foot Pain: Feels pain in the bottom of her feet and posterior heels first thing in the morning when getting out of bed. Gets better after 30 mins or so. has not tired anything yet.   Habits & Providers  Alcohol-Tobacco-Diet     Alcohol drinks/day: none     Tobacco Status: quit     Tobacco Counseling: to remain off tobacco products     Year Started: 11/2003     Year Quit: 2005     Pack years: 10  Current Problems (verified): 1)  Cough, Chronic  (ICD-786.2) 2)  Plantar Fasciitis, Bilateral  (ICD-728.71) 3)  Special Screening For Malignant  Neoplasms Colon  (ICD-V76.51) 4)  Fibroids, Uterus  (ICD-218.9) 5)  Hypertension, Benign Systemic  (ICD-401.1) 6)  Screening For Malignant Neoplasm of The Cervix  (ICD-V76.2) 7)  Ascus Pap  (ICD-795.01) 8)  Cervical Radiculopathy  (ICD-723.4) 9)  Sickle Cell Trait  (ICD-282.5)  Medications Prior to Update: 1)  Aspirin 81 Mg Chew (Aspirin) 2)  One-A-Day Womens Formula  Tabs (Multiple Vitamins-Calcium) 3)  Lisinopril-Hydrochlorothiazide 20-25 Mg Tabs (Lisinopril-Hydrochlorothiazide) .... Take One Daily 4)  Metoprolol Tartrate 25 Mg Tabs (Metoprolol Tartrate) .... Take One By Mouth Two Times A Day 5)  Nitrostat 0.4 Mg Subl (Nitroglycerin) .... Take One Sl Q36min X 3 As Needed Chest Pain.  Call Doctor If Used. 6)  Amlodipine Besylate 5 Mg Tabs (Amlodipine Besylate) .... One By Mouth Daily  Current Medications (verified): 1)  Aspirin 81 Mg Chew (Aspirin) 2)  One-A-Day Womens Formula  Tabs (Multiple Vitamins-Calcium) 3)  Hyzaar 100-25 Mg Tabs (Losartan Potassium-Hctz) .[name].. 1 By Mouth Daily. Failed Acei. 4)  Metoprolol Tartrate 25 Mg Tabs (Metoprolol Tartrate) .... Take One By Mouth Two Times A Day 5)  Nitrostat 0.4 Mg Subl (Nitroglycerin) .... Take One Sl Q21min X 3 As Needed Chest Pain.  Call Doctor If Used. 6)  Amlodipine Besylate 5 Mg Tabs (Amlodipine Besylate) .... One By Mouth Daily 7)  Omeprazole 20 Mg Cpdr (Omeprazole) .[name].. 1 By Mouth Daily For Gerd 8)  Heel Cups .... Use Bl in All Shoes For Plantar Fasciitis  Allergies (verified): 1)  ! Codeine  Past History:  Past Medical History: Last updated: 12/21/2009 HTN h/o anemia, resolved with menopause hospitalized 05/21/01 -palpitations/CP 2.6cm posterior uterine fibroid  h/o postmenopausal bleed with 13mm stripe on [place] 2008, "scant" bx normal adenomyosis  Past Surgical History: Last updated: 12/21/2009 adenosine cardiolite-nml, EF 66% - 04/29/2001 c-section - 09/30/1991 lap chole - 03/30/1999 lumpectomy 1994- benign Endometrial bx  03/2007 - WNL, scant benign endometrial mucosa, no atypia Endometrial bx 06/2009 - WNL, scant atrophic endometrium, no atypia ASCUS PAP 08/2009, colpo 10/2009 WNL, rpt 1 year. Sleep study 08/2009 - WNL Thyroid US done for ? of nodule on exam - R microadenoma, WNL 09/2009 Cardiolite, exercise done for chest pain - WNL, nl LVEF, no ST-T changes, no wall motion abnl 09/2009 TVS 11/2009 - uterine fibroid and ?adenomyosis  Family History: Last updated: 2006-12-24 Brother died at 40 yo with DM, HTN, kidney dz., father died at 57 with CVA/ prostate ca, HTN, and arthritis, mother died age 19 of pancreatic ca.  Had HTN, Sister died at age 44of cancer, another sister died at age 47 of cancer. Uterine fibroids run in family.  Social History: Last updated: 04/13/2009 Laid off 1/02 from [employer] after an injury.  Quit smoking 04/29/01.  working again as of visit 03/2009  Risk Factors: Smoking Status: quit (08/15/2010)  Review of Systems       The patient complains of prolonged cough.  The patient denies anorexia, fever, weight loss, hoarseness, chest pain, syncope, dyspnea on exertion, hemoptysis, abdominal pain, hematochezia, severe indigestion/heartburn, depression, and breast masses.    Physical Exam  General:  VS noted.  Well NAD Eyes:  vision grossly intact, pupils equal, pupils round, and pupils reactive to light.  EOMI Ears:  no external deformities.   Nose:  no external erythema.   Mouth:  MMM Neck:  No JVD Lungs:  Normal respiratory effort, chest expands symmetrically. Lungs are clear to auscultation, no crackles or wheezes. Heart:  Normal rate and regular rhythm. S1 and S2 normal without gallop, murmur, click, rub or other extra sounds. Abdomen:  Bowel sounds positive,abdomen soft and non-tender without masses, organomegaly or hernias noted. Msk:  Feet BL: Normal appearing. Mildly tender on plantar aspect of heels BL. Noother abnormalities Extremities:  Warm and well parfused. non  edemetus.  Cervical Nodes:  No lymphadenopathy noted Axillary Nodes:  No palpable lymphadenopathy   Impression & Recommendations:  Problem # 1:  COUGH, CHRONIC (ICD-786.2) Assessment New  Think this is related to ACEi cough and also to GERD. Dry day time cough seems more consistant with ACEi as it started at around the same time. Plan: Stop ACEi and switch to Hyzaar. Will need prior auth which she will qualify for. Will follow up in 1 month to reassess.  Morning productive cough is more GERD like. No other GERD red flags. Plan to start a PPI trial and reassess. Will follow as above.  Orders: [facility]- Est  Level 4 ([address])  Problem # 2:  PLANTAR FASCIITIS, BILATERAL (ICD-728.71) Assessment: New  Think foot pain is plantar fascitis. Will start a trial of heel massage, ice baths, and heel cups.  Will follow up in 1 month. May benfit from sports insoles. Will follow.   Orders: [facility]- Est  Level 4 ([address])  Problem # 3:  HYPERTENSION, BENIGN SYSTEMIC (ICD-401.1) Assessment:  Deteriorated BP elevated today. Increasing the relative dose with Hyzaar 100mg . Will follow up blood pressure and creatinine at the next check. Discussed red flags with the patient.    Her updated medication list for this problem includes:    Hyzaar 100-25 Mg Tabs (Losartan potassium-hctz) .[name]... 1 by mouth daily. failed acei.    Metoprolol Tartrate 25 Mg Tabs (Metoprolol tartrate) .[name]... Take one by mouth two times a day    Amlodipine Besylate 5 Mg Tabs (Amlodipine besylate) ..... One by mouth daily  Orders: Basic Met-[facility] [id number]) Direct LDL-[facility] [phone]) [facility]- Est  Level 4 ([address])  BP today: 146/90 Prior BP: 137/87 (12/21/2009)  Labs Reviewed: K+: 3.8 (08/09/2009) Creat: : 0.80 (08/09/2009)   Chol: 161 (10/16/2009)   HDL: 49 (12/09/2007)   LDL: 91 (10/16/2009)   TG: 130 (10/16/2009)  Complete Medication List: 1)  Aspirin 81 Mg Chew (Aspirin) 2)  One-a-day Womens Formula Tabs (Multiple  vitamins-calcium) 3)  Hyzaar 100-25 Mg Tabs (Losartan potassium-hctz) .[name].. 1 by mouth daily. failed acei. 4)  Metoprolol Tartrate 25 Mg Tabs (Metoprolol tartrate) .... Take one by mouth two times a day 5)  Nitrostat 0.4 Mg Subl (Nitroglycerin) .... Take one sl q43min x 3 as needed chest pain.  call doctor if used. 6)  Amlodipine Besylate 5 Mg Tabs (Amlodipine besylate) .... One by mouth daily 7)  Omeprazole 20 Mg Cpdr (Omeprazole) .[name].. 1 by mouth daily for gerd 8)  Heel Cups  .... Use bl in all shoes for plantar fasciitis  Other Orders: Influenza Vaccine NON MCR (40102)  Patient Instructions: 1)  Thank you for seeing me today. 2)  Get the heel cups and wear them with all of your shoes.  3)  Massage your heels twice daily while pulling back on your toes. Total time 15 mins each side.  4)  Put you heels in a bucket of ice water every night for 15 mins.  5)  Switch to the Hyzzar daily. Give [place] a few days for the prior authorization forms.  6)  Start taking the omeprazole daily for heart burn. This may help with the phlegm in the morning.  7)  Please schedule a follow-up appointment in 1 month.  Prescriptions: AMLODIPINE BESYLATE 5 MG TABS (AMLODIPINE BESYLATE) one by mouth daily  #30 x 6   Entered and Authorized by:   Clementeen Graham MD   Signed by:   Clementeen Graham MD on 08/15/2010   Method used:   Electronically to        [facility] [address] (retail)       [phone]       [place], [place]  [address]       Ph: [phone]       Fax: [phone]   RxID:   [id number] HEEL CUPS Use BL in all shoes for plantar fasciitis  #2 x 1   Entered and Authorized by:   Clementeen Graham MD   Signed by:   Clementeen Graham MD on 08/15/2010   Method used:   Print then Give to Patient   RxID:   [id number] OMEPRAZOLE 20 MG CPDR (OMEPRAZOLE) 1 by mouth daily for gerd  #30 x 11   Entered and Authorized by:   Clementeen Graham MD   Signed by:   Clementeen Graham MD on 08/15/2010   Method  used:   Electronically to        [facility] [address] (retail)  [address]       [place], [place]  [address]       Ph: [phone]       Fax: [phone]   RxID:   [id number] HYZAAR 100-25 MG TABS (LOSARTAN POTASSIUM-HCTZ) 1 by mouth daily. Failed ACEi.  #30 x 11   Entered and Authorized by:   Clementeen Graham MD   Signed by:   Clementeen Graham MD on 08/15/2010   Method used:   Electronically to        [facility] [address] (retail)       [phone]       [place], [place]  [address]       Ph: [phone]       Fax: [phone]   RxID:   [id number]    Orders Added: 1)  Basic Met-[facility] [phone] 2)  Direct LDL-[facility] [phone] 3)  Influenza Vaccine NON MCR [00028] 4)  [facility]- Est  Level 4 [[address]]   Immunizations Administered:  Influenza Vaccine # 1:    Vaccine Type: Fluvax Non-MCR    Site: right deltoid    Mfr: GlaxoSmithKline    Dose: 0.5 ml    Route: IM    Given by: Tessie Fass CMA    Exp. Date: 03/29/2011    Lot #: [id number]    VIS given: 04/23/10 version given August 15, 2010.  Flu Vaccine Consent Questions:    Do you have a history of severe allergic reactions to this vaccine? no    Any prior history of allergic reactions to egg and/or gelatin? no    Do you have a sensitivity to the preservative Thimersol? no    Do you have a past history of Guillan-Barre Syndrome? no    Do you currently have an acute febrile illness? no    Have you ever had a severe reaction to latex? no    Vaccine information given and explained to patient? yes    Are you currently pregnant? no   Immunizations Administered:  Influenza Vaccine # 1:    Vaccine Type: Fluvax Non-MCR    Site: right deltoid    Mfr: GlaxoSmithKline    Dose: 0.5 ml    Route: IM    Given by: Tessie Fass CMA    Exp. Date: 03/29/2011    Lot #: [id number]    VIS given: 04/23/10 version given August 15, 2010.   Prevention  & Chronic Care Immunizations   Influenza vaccine: Fluvax Non-MCR  (08/15/2010)    Tetanus booster: 08/29/2001: Done.   Tetanus booster due: 08/30/2011    Pneumococcal vaccine: Not documented  Colorectal Screening   Hemoccult: Done.  (08/29/2006)    Colonoscopy: DONE  (04/05/2010)   Colonoscopy action/deferral: GI Referral  (12/21/2009)   Colonoscopy due: 03/2020  Other Screening   Pap smear: COLPO normal  (11/01/2009)   Pap smear due: 11/01/2010    Mammogram: ASSESSMENT: Negative - BI-RADS 1^MM DIGITAL SCREENING  (10/18/2009)   Mammogram action/deferral: Ordered  (08/09/2009)   Mammogram due: 10/18/2010   Smoking status: quit  (08/15/2010)  Lipids   Total Cholesterol: 161  (10/16/2009)   LDL: 91  (10/16/2009)   LDL Direct: Not documented   HDL: 49  (12/09/2007)   Triglycerides: 130  (10/16/2009)  Hypertension   Last Blood Pressure: 146 / 90  (08/15/2010)   Serum creatinine: 0.80  (08/09/2009)   Serum potassium 3.8  (08/09/2009)  Self-Management  Support :   Personal Goals (by the next clinic visit) :      Personal blood pressure goal: 140/90  (08/09/2009)   Hypertension self-management support: BP self-monitoring log, Written self-care plan, Pre-printed educational material  (09/10/2009)    Hypertension self-management support not done because: Good outcomes  (12/21/2009)

## 2010-10-29 NOTE — Progress Notes (Signed)
Summary: Pt would like to know what GYN doctor she was referred to   Phone Note Call from Patient Call back at Home Phone [phone]   Reason for Call: Talk to Nurse Summary of Call: pt was referred to a gyn doctor and she lost the information, she wants to know who she was referred to? Initial call taken by: ERIN LEVAN,  March 15, 2007 4:55 PM  Follow-up for Phone Call        Pt referred to [facility]. LMOVM for pt to call back.  Will give pt # [phone] Follow-up by: [facility] CMA,  March 16, 2007 10:11 AM  Additional Follow-up for Phone Call Additional follow up Details #1::        Pt given the above information Additional Follow-up by: [facility] CMA,  March 16, 2007 4:16 PM

## 2010-10-29 NOTE — Miscellaneous (Signed)
Summary: ULTRASOUND APPROVED  Clinical Lists Changes [place] OF SOFT TISSUES OF HEAD APPROVED BY [employer] #a [id number]...Marland KitchenMarland KitchenGolden Circle RN  October 16, 2009 4:38 PM

## 2010-10-29 NOTE — Progress Notes (Signed)
Summary: re: records from gyn/ts   Phone Note Outgoing Call Call back at [phone]   Summary of Call: attempted to call patient to give results of blood work.  LMOVM asking her also to call us back with practice of Dr. Lowell Guitar (GYN) to get pap smear records as we aren't finding one in [place].  also still awaiting endometrial bx. Initial call taken by: Eustaquio Boyden  MD,  July 20, 2009 8:41 AM  Follow-up for Phone Call        pt returned call about labs. needs rx for Lisinopril/hctz - has dosage been upped? [facility] Follow-up by: De Nurse,  July 20, 2009 9:41 AM  Additional Follow-up for Phone Call Additional follow up Details #1::        yes.  Lowell Guitar was PA.  With [facility].  Dr Debria Garret office.  Discussed results of blood work.  [facility] sent refill to [facility].  please send ROI form to Dr. Debria Garret office.  tahnks! Additional Follow-up by: Eustaquio Boyden  MD,  July 20, 2009 1:40 PM    Additional Follow-up for Phone Call Additional follow up Details #2::    called pt and lmvm to call back. pt needs to sign ROI for Dr.Stringer's office or have them fax the info to [place]. waiting for pt to call back. Follow-up by: Arlyss Repress CMA,,  July 23, 2009 10:29 AM  Additional Follow-up for Phone Call Additional follow up Details #3:: Details for Additional Follow-up Action Taken: she signed an ROI form when she was seen last week, had "Dr. Lowell Guitar" on it. Additional Follow-up by: Eustaquio Boyden  MD,  July 23, 2009 10:30 AM  pt will have records faxed to dr.[email or web address] .Arlyss Repress CMA,  July 23, 2009 2:21 PM

## 2010-10-29 NOTE — Assessment & Plan Note (Signed)
Summary: post menapausal bleed,df   Vital Signs:  Patient profile:   60 year old female Weight:      175.0 pounds Temp:     97.4 degrees F oral Pulse rate:   85 / minute BP sitting:   157 / 97  (left arm)  Vitals Entered By: Alphia Kava (July 13, 2009 1:46 PM) CC: post menopausal bleeding Is Patient Diabetic? No   Primary Care Provider:  Eustaquio Boyden  MD  CC:  post menopausal bleeding.  History of Present Illness: 60 y/o female here with vaginal bleeding  vaginal bleeding- LMP >17 months ago. previously saw gynecologist for uterine issues, but doesnt remember what. last visit was January 2009, and patient reports no longer f/u was necessary. bleeding started on sunday 07/08/09. was just a little redness noted while she went to bathroom. progressively worsened. on monday 07/09/09, the bleeding had increased and she passed several clots.   Habits & Providers  Alcohol-Tobacco-Diet     Tobacco Status: quit     Tobacco Counseling: to remain off tobacco products  Current Medications (verified): 1)  Cyclobenzaprine Hcl 10 Mg Tabs (Cyclobenzaprine Hcl) .... 1/2 - 1 By Mouth Three Times A Day For Muscle Spasms 2)  Naproxen 500 Mg Tabs (Naproxen) .[name].. 1 By Mouth Two Times A Day For Pain For 2 Weeks Then As Needed 3)  Lisinopril-Hydrochlorothiazide 10-12.5 Mg Tabs (Lisinopril-Hydrochlorothiazide) .[name].. 1 By Mouth Once Daily For Blood Pressure  Allergies (verified): 1)  ! Codeine  Physical Exam  General:  Well-developed,well-nourished,in no acute distress; alert,appropriate and cooperative throughout examination. vitals reviewed.  Abdomen:  Bowel sounds positive,abdomen soft and non-tender without masses, organomegaly or hernias noted. Genitalia:  Normal introitus for age, no external lesions, scant blood in vaginal vault, mucosa pink and moist, no vaginal. small lesion on cervix, possible polyp, no vaginal atrophy, no friability or hemorrhage   Procedure Note Last Tetanus:  Done. (08/29/2001)  Biopsy: Biopsy Type: endometrial Date of onset: 07/08/2009 Indication: postmenopausal bleeding Consent signed: yes    Comment: speculum was inserted. cervix was prepped in sterile fashion with betadine. cervix was grasped at 10 and 2 o clock with tenaculum. uterus was sounded to 5 cm. endometrial biopsy was obtained with pipette. tenaculum was removed. minimal bleeding. patient tolerated the procedure well.    Impression & Recommendations:  Problem # 1:  POSTMENOPAUSAL BLEEDING (ICD-627.1) Assessment New endometrial bx completed. ultrasound scheduled. f/u dependent on results.   Orders: Ultrasound (Ultrasound) [facility]- Est  Level 4 ([address]) [facility]  Patient Instructions: 1)  Schedule appointment with Dr. Sharen Hones in 1-2 weeks to discuss other issues 2)  We will contact you with the results of your test.  3)  You can use ibuprofen for any abdominal cramping.

## 2010-10-29 NOTE — Miscellaneous (Signed)
Summary: Pt on lisinopril.  Clinical Lists Changes  Medications: Added new medication of LISINOPRIL 40 MG  TABS (LISINOPRIL) 1 by mouth daily - Signed Rx of LISINOPRIL 40 MG  TABS (LISINOPRIL) 1 by mouth daily;  #34 x 1;  Signed;  Entered by: Rolm Gala MD;  Authorized by: Rolm Gala MD;  Method used: Electronic    Prescriptions: LISINOPRIL 40 MG  TABS (LISINOPRIL) 1 by mouth daily  #34 x 1   Entered and Authorized by:   Rolm Gala MD   Signed by:   Rolm Gala MD on 11/22/2007   Method used:   Electronically sent to ...       [facility] [address] [id number]       [address]       [place], [place]  [address]       Ph: [phone]       Fax: [phone]   RxID:   [id number]

## 2010-10-29 NOTE — Miscellaneous (Signed)
Summary: phone PAP  Clinical Lists Changes   ASCUS, negative for high risk HPV.  However, I'd like patient to have colposcopy as I did see a lesion on pap smear that I'd like taken a look at.  Called and left message on answering machine asking patient to give [place] a call back.  Eustaquio Boyden  MD  October 03, 2009 11:09 AM  Pt here today work in.  Will discuss above with patient. Eustaquio Boyden  MD  October 05, 2009 8:52 AM

## 2010-10-29 NOTE — Progress Notes (Signed)
Summary: referral  Phone Note Call from Patient Call back at Home Phone [phone]   Caller: Patient Summary of Call: pt needs a colonoscopy and wants to go to [facility] for this and needs it on a Thurs or Friday we would need to make the referral Initial call taken by: De Nurse,  Feb 14, 2010 4:36 PM  Follow-up for Phone Call        routed to red team. Follow-up by: Eustaquio Boyden  MD,  Feb 15, 2010 8:31 AM  Additional Follow-up for Phone Call Additional follow up Details #1::        appt scheduled at [facility]. Additional Follow-up by: Tessie Fass CMA,  Feb 18, 2010 10:04 AM  New Problems: SPECIAL SCREENING FOR MALIGNANT NEOPLASMS COLON (ICD-V76.51)   New Problems: SPECIAL SCREENING FOR MALIGNANT NEOPLASMS COLON (ICD-V76.51)

## 2010-10-29 NOTE — Assessment & Plan Note (Signed)
Summary: CPE/chest pain, HTN   Vital Signs:  Patient profile:   60 year old female Height:      63.75 inches Weight:      171 pounds BMI:     29.69 Temp:     98.5 degrees F oral Pulse rate:   78 / minute BP sitting:   152 / 94  (left arm) Cuff size:   regular  Vitals Entered By: Tessie Fass CMA (September 10, 2009 2:38 PM) CC: complete physical with pap Is Patient Diabetic? No Pain Assessment Patient in pain? no        Primary Care Provider:  Eustaquio Boyden  MD  CC:  complete physical with pap.  History of Present Illness: CC: CPE, concern with HTN, Chest pain  1. CPE - last mammogram 2007.  given script for mammo 07/2009, still hasn't done.  last pap 2007, WNL.  rpt today.  sexually abstinent since 2004.  Feels safe at home.  2. HTN - compliant with meds (ACEI/HCTZ).  on upper limit of dose currently.  Denies HA, leg swelling, urinary changes.  concerned about elevated blood pressure.  stays away from salt, walks almost daily for 30 minutes.  eats lots of fruits and vegetables.  last cholesterol level was 2008, WNL.  recent increase in blood pressure.  3. Chest pain - describes intermittent substernal chest pressure that comes on at rest, last a few mintues and goes away.  Associated with SOB at times.  None currently.  No change with activity or rest.  Thinks it may also feel like indigestion.  Radiates to left arm.  Hospitalization 2002 for CP and palpitations, underwent adenosine cardiolyte WNL per our records 2002.  States went to cardiologist last year, but no record of this in chart.  Doesn't remember cardiologist name.  Says will call us with cardiologist name so we can re refer to him.  Has old nitro SL from 2002.  4. states she's been told she has large thyroid by other doctors but has never had ultrasound.  TSH 2 mo ago WNL.  Habits & Providers  Alcohol-Tobacco-Diet     Tobacco Status: quit  Allergies: 1)  ! Codeine  Past History:  Past medical,  surgical, family and social histories (including risk factors) reviewed, and no changes noted (except as noted below).  Past Medical History: h/o anemia, resolved with menopause hospitalized 05/21/01 -palpitations/CP 2.6cm posterior uterine fibroid  adenomyosis  Past Surgical History: Reviewed history from 08/02/2009 and no changes required. adenosine cardiolite-nml, EF 66% - 04/29/2001 c-section - 09/30/1991 lap chole - 03/30/1999 lumpectomy 1994- benign Endometrial bx 03/2007 - WNL, scant benign endometrial mucosa, no atypia Endometrial bx 06/2009 - WNL, scant atrophic endometrium, no atypia  Family History: Reviewed history from 11/26/2006 and no changes required. Brother died at 38 yo with DM, HTN, kidney dz., father died at 77 with CVA/ prostate ca, HTN, and arthritis, mother died age 2 of pancreatic ca.  Had HTN, Sister died at age 27of cancer, another sister died at age 12 of cancer. Uterine fibroids run in family.  Social History: Reviewed history from 04/13/2009 and no changes required. Laid off 1/02 from [employer] after an injury.  Quit smoking 04/29/01.  working again as of visit 7/2010Smoking Status:  quit  Physical Exam  General:  Well-developed,well-nourished,in no acute distress; alert,appropriate and cooperative throughout examination Neck:  small mass palpated left thyroid/muscle.  difficult to appreciate if part of SCM mm but there is asymetry noted from R side.  unable to definitively tell if part of thyroid gland Breasts:  No mass, nodules, thickening, tenderness, bulging, retraction, inflamation, nipple discharge or skin changes noted.   Lungs:  Normal respiratory effort, chest expands symmetrically. Lungs are clear to auscultation, no crackles or wheezes. Heart:  Normal rate and regular rhythm. S1 and S2 normal without gallop, murmur, click, rub or other extra sounds. 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  unable to definitively ascertain if actually growth of thyroid or part of SCM muscle.  Orders: [facility] - Est  40-64 yrs ([address])  Problem # 2:  CHEST PAIN (ICD-786.50) Not many risk factors.  Chol level WNL 1 1/2 yrs ago.  No longer a smoker, not diabetic.  no early fm hx of CAD.  However, does have concerning description for cardiac etiology.  will re-refer to cardiology for work up- likely stress test, maybe stress echo (apparently went last year).  Will call us to let us know who cardiologist is to re-refer.  meanwhile, will start beta blocker, continue to better control blood pressure, and prescribe SL nitro to use next time she has chest pain.  to call us or go to ED if this happens.  Orders: Cardiology Referral (Cardiology) Ultrasound (Ultrasound) 12 Lead EKG (12 Lead EKG) [facility] - Est  40-64 yrs ([address])  Problem # 3:  VAGINAL DISCHARGE (ICD-623.5) wet prep negative for infection.    Orders: Wet Prep- [facility] (87[address]) [facility] - Est  40-64 yrs ([address])  Problem # 4:  SCREENING FOR MALIGNANT NEOPLASM OF THE CERVIX (ICD-V76.2) pap doen. Orders: Pap Smear-[facility] ([id number]) [facility] - Est  40-64 yrs ([address])  Problem # 5:  HYPERTENSION, BENIGN SYSTEMIC (ICD-401.1) started metoprolol for better BP control as well as while gt in to see cards.  check FLP again given BP has steadily been rising to reassess cholesterol control.  gave red card to keep log of BPs. Her updated medication list for this problem includes:    Lisinopril-hydrochlorothiazide 20-25 Mg Tabs (Lisinopril-hydrochlorothiazide) .[name]... Take one daily    Metoprolol Tartrate 25 Mg Tabs (Metoprolol tartrate)  .[name]... Take one by mouth two times a day  Orders: [facility] - Est  40-64 yrs ([address])Future Orders: Lipid-[facility] ([id number]) ... 09/04/2010  Complete Medication List: 1)  Aspirin 81 Mg Chew (Aspirin) 2)  One-a-day Womens Formula Tabs (Multiple vitamins-calcium) 3)  Lisinopril-hydrochlorothiazide 20-25 Mg Tabs (Lisinopril-hydrochlorothiazide) .... Take one daily 4)  Flexeril 10 Mg Tabs (Cyclobenzaprine hcl) .... Take one by mouth two times a day as needed muscle spasm 5)  Metoprolol Tartrate 25 Mg Tabs (Metoprolol tartrate) .... Take one by mouth two times a day 6)  Nitrostat 0.4 Mg Subl (Nitroglycerin) .... Take one sl q37min x 3 as needed chest pain.  call doctor if used.  Patient Instructions: 1)  Return in 1 month for blood prsesure follow up. 2)  New medicine for blood pressure - Metoprolol 25mg  Twice daily. 3)  Sublingual nitro if you  have chest pressure again -if used call doctor or go to hospital.  Call heart doctor to set up appointment. 4)  We will set you up with thyroid ultrasound. 5)  Go get a mammogram. 6)  we did a pap smear today, if you havent from [place] in 2 wks, call us. 7)  Come fasting in the next few weeks for cholesterol. 8)  Good to see you. Prescriptions: NITROSTAT 0.4 MG SUBL (NITROGLYCERIN) take one sl q61min x 3 as needed chest pain.  call doctor if used.  #15 x 0   Entered and Authorized by:   Eustaquio Boyden  MD   Signed by:   Eustaquio Boyden  MD on 09/10/2009   Method used:   Electronically to        [facility] [address] (retail)       [phone]       [place], [place]  [address]       Ph: [phone]       Fax: [phone]   RxID:   [id number] METOPROLOL TARTRATE 25 MG TABS (METOPROLOL TARTRATE) take one by mouth two times a day  #60 x 3   Entered and Authorized by:   Eustaquio Boyden  MD   Signed by:   Eustaquio Boyden  MD on 09/10/2009   Method used:   Electronically to        [facility] [address]  (retail)       [phone]       [place], [place]  [address]       Ph: [phone]       Fax: [phone]   RxID:   [id number]    Prevention & Chronic Care Immunizations   Influenza vaccine: Fluvax Non-MCR  (07/19/2009)    Tetanus booster: 08/29/2001: Done.   Tetanus booster due: 08/30/2011    Pneumococcal vaccine: Not documented  Colorectal Screening   Hemoccult: Done.  (08/29/2006)    Colonoscopy: Not documented  Other Screening   Pap smear: Done.  (08/29/2006)    Mammogram: Done.  (10/30/2005)   Mammogram action/deferral: Ordered  (08/09/2009)   Smoking status: quit  (09/10/2009)  Lipids   Total Cholesterol: 162  (12/09/2007)   LDL: 90  (12/09/2007)   LDL Direct: Not documented   HDL: 49  (12/09/2007)   Triglycerides: 114  (12/09/2007)  Hypertension   Last Blood Pressure: 152 / 94  (09/10/2009)   Serum creatinine: 0.80  (08/09/2009)   Serum potassium 3.8  (08/09/2009)    Hypertension flowsheet reviewed?: Yes   Progress toward BP goal: Unchanged  Self-Management Support :   Personal Goals (by the next clinic visit) :      Personal blood pressure goal: 140/90  (08/09/2009)   Patient will work on the following items until the next clinic visit to reach self-care goals:     Medications and monitoring: take my medicines every day, check my blood pressure  (09/10/2009)    Hypertension self-management support: BP self-monitoring log, Written self-care plan, Pre-printed educational material  (09/10/2009)   Hypertension self-care plan printed.    Hypertension self-management support not done because: Good outcomes  (08/09/2009)   Laboratory Results  Date/Time Received: September 10, 2009 3:09 PM  Date/Time Reported: September 10, 2009 3:20 PM   [employer] Source: vaginal WBC/hpf: 5-10 Bacteria/hpf: 2+  Rods Clue cells/hpf: none  Negative whiff Yeast/hpf: none Trichomonas/hpf: none Comments: ..........[name]  test performed  by...........Marland KitchenTerese Door, CMA    Appended Document: CPE/chest pain, HTN update - rechecked BP manually, 150/98.  also, obtained EKG - NSR 66, no ST changes.

## 2010-10-29 NOTE — Progress Notes (Signed)
Summary: Triage  Phone Note Call from Patient Call back at [phone]   Summary of Call: Pt wants to speak witha  nurse about being released from the hospital and medication they prescribed her to. Initial call taken by: Haydee Salter,  November 03, 2007 12:04 PM  Follow-up for Phone Call        c/o in L upper back. progressively worse since Saturday. has spasms & "it cuts my breath off' when she lays down. . went to ED Monday & had xray.  was given a muscle relaxant. has been on  them since yesterday. to take that & ibuprofen until appt. uses the bus. appt made for 11am Thursday Follow-up by: Golden Circle RN,  November 03, 2007 12:05 PM

## 2010-10-29 NOTE — Progress Notes (Signed)
Summary: refill  Phone Note Refill Request Call back at [phone] Message from:  Patient  Refills Requested: Medication #1:  LISINOPRIL-HYDROCHLOROTHIAZIDE 10-12.5 MG TABS 1 by mouth once daily for blood pressure. [facility] [address]  Initial call taken by: De Nurse,  June 27, 2009 8:55 AM    Prescriptions: LISINOPRIL-HYDROCHLOROTHIAZIDE 10-12.5 MG TABS (LISINOPRIL-HYDROCHLOROTHIAZIDE) 1 by mouth once daily for blood pressure  #30 x 1   Entered by:   Golden Circle RN   Authorized by:   Eustaquio Boyden  MD   Signed by:   Golden Circle RN on 06/27/2009   Method used:   Electronically to        [facility] [address] (retail)       [phone]       [place], [place]  [address]       Ph: [phone]       Fax: [phone]   RxID:   [id number]

## 2010-10-29 NOTE — Letter (Signed)
Summary: [facility] Instructions  [facility]  [address] [place], [place] [address]   Phone: [phone]  Fax: [phone]       Sheila Fernandez    26-Mar-1951    MRN: [id number]        Procedure Day /Date:  04/05/10  Friday     Arrival Time: 10:00am      Procedure Time:  11:00am     Location of Procedure:                    _ x_  [facility] (4th Floor)                        PREPARATION FOR COLONOSCOPY WITH MOVIPREP   Starting 5 days prior to your procedure _ 7/3/11_ do not eat nuts, seeds, popcorn, corn, beans, peas,  salads, or any raw vegetables.  Do not take any fiber supplements (e.g. Metamucil, Citrucel, and Benefiber).  THE DAY BEFORE YOUR PROCEDURE         DATE:  04/04/10  DAY:  Thursday  1.  Drink clear liquids the entire day-NO SOLID FOOD  2.  Do not drink anything colored red or purple.  Avoid juices with pulp.  No orange juice.  3.  Drink at least 64 oz. (8 glasses) of fluid/clear liquids during the day to prevent dehydration and help the prep work efficiently.  CLEAR LIQUIDS INCLUDE: Water Jello Ice Popsicles Tea (sugar ok, no milk/cream) Powdered fruit flavored drinks Coffee (sugar ok, no milk/cream) Gatorade Juice: apple, white grape, white cranberry  Lemonade Clear bullion, consomm, broth Carbonated beverages (any kind) Strained chicken noodle soup Hard Candy                             4.  In the morning, mix first dose of MoviPrep solution:    Empty 1 Pouch A and 1 Pouch B into the disposable container    Add lukewarm drinking water to the top line of the container. Mix to dissolve    Refrigerate (mixed solution should be used within 24 hrs)  5.  Begin drinking the prep at 5:00 p.m. The MoviPrep container is divided by 4 marks.   Every 15 minutes drink the solution down to the next mark (approximately 8 oz) until the full liter is complete.   6.  Follow completed prep with 16 oz of clear liquid of your choice  (Nothing red or purple).  Continue to drink clear liquids until bedtime.  7.  Before going to bed, mix second dose of MoviPrep solution:    Empty 1 Pouch A and 1 Pouch B into the disposable container    Add lukewarm drinking water to the top line of the container. Mix to dissolve    Refrigerate  THE DAY OF YOUR PROCEDURE      DATE:   04/05/10  DAY:   Friday  Beginning at 6:00  a.m. (5 hours before procedure):         1. Every 15 minutes, drink the solution down to the next mark (approx 8 oz) until the full liter is complete.  2. Follow completed prep with 16 oz. of clear liquid of your choice.    3. You may drink clear liquids until  9:00am  (2 HOURS BEFORE PROCEDURE).   MEDICATION INSTRUCTIONS  Unless otherwise instructed, you should take regular prescription medications with a small  sip of water   as early as possible the morning of your procedure.    Additional medication instructions:  Hold Lisinopril-HCTZ pill morning of procedure only.         OTHER INSTRUCTIONS  You will need a responsible adult at least 60 years of age to accompany you and drive you home.   This person must remain in the waiting room during your procedure.  Wear loose fitting clothing that is easily removed.  Leave jewelry and other valuables at home.  However, you may wish to bring a book to read or  an iPod/MP3 player to listen to music as you wait for your procedure to start.  Remove all body piercing jewelry and leave at home.  Total time from sign-in until discharge is approximately 2-3 hours.  You should go home directly after your procedure and rest.  You can resume normal activities the  day after your procedure.  The day of your procedure you should not:   Drive   Make legal decisions   Operate machinery   Drink alcohol   Return to work  You will receive specific instructions about eating, activities and medications before you leave.    The above instructions have been  reviewed and explained to me by  Sherren Kerns RN  March 22, 2010 10:23 AM      I fully understand and can verbalize these instructions _____________________________ Date _________

## 2010-10-29 NOTE — Letter (Signed)
Summary: Generic Letter  [facility]  [address]   [place], [place] [address]   Phone: [phone]  Fax: [phone]    12/10/2007 MRN: [id number]  [address] [place], [place]  [address]  Dear Ms. Keirsey,  Your electrolytes, kidney function, and liver function are fine.   Sodium                    144 mEq/L                   135-145   Potassium                 3.9 mEq/L                   3.5-5.3   Chloride                  108 mEq/L                   96-112   CO2                       24 mEq/L                    19-32   Glucose                   92 mg/dL                    52-84   BUN                       13 mg/dL                    1-32   Creatinine                0.72 mg/dL                  0.40-1.20   Bilirubin, Total          0.7 mg/dL                   4.4-0.1   Alkaline Phosphatase      104 U/L                     39-117   AST/SGOT                  17 U/L                      0-37   ALT/SGPT                  16 U/L                      0-35   Total Protein             7.5 g/dL                    0.2-7.2   Albumin                   4.4 g/dL                    5.3-6.6   Calcium  9.0 mg/dL                   1.6-10.9  Your cholsterol, both the "good" and "bad" are fine also.   Cholesterol               162 mg/dL                   6-045     ATP III Classification:           < 200        mg/dL        Desirable          200 - 239     mg/dL        Borderline High          >= 240        mg/dL        High         Triglyceride              114 mg/dL                   <409   HDL Cholesterol           49 mg/dL                    >81   Total Chol/HDL Ratio      3.3 Ratio  VLDL Cholesterol (Calc)                             23 mg/dL                    1-91  LDL Cholesterol (Calc)                             90 mg/dL                    4-78    Sincerely,      [place]m Gala MD [facility]

## 2010-10-29 NOTE — Letter (Signed)
Summary: Generic Letter  [facility]  [address]   [place], [place] [address]   Phone: [phone]  Fax: [phone]    08/16/2010  ZAURIA DOMBEK [address] [place], [place]  [address]  Dear Ms. Outland,  Your lab work came back. It looks good.  Sodium                    139 mEq/L                   135-145   Potassium                 3.9 mEq/L                   3.5-5.3   Chloride                  105 mEq/L                   96-112   CO2                       27 mEq/L                    19-32   Glucose                   98 mg/dL                    37-62   BUN                       18 mg/dL                    8-31   Creatinine                0.70 mg/dL                  0.40-1.20   Calcium                   9.4 mg/dL                   5.1-76.1 ! Est GFR, African American                             >60 mL/min                  >60 ! Est GFR, NonAfrican American                             >60 mL/min                  >60  Tests: (2) LDL, Direct ([address])   LDL, Direct               60 mg/dL     ATP III Classification (LDL):           < 100        mg/dL         Optimal          100 - 129     mg/dL         Near or Above Optimal  130 - 159     mg/dL         Borderline High          160 - 189     mg/dL         High           > 190        mg/dL         Very High  Your Creatinine is a kidney function marker and that looks normal.  Your LDL is  the bad cholesterol. That level is great.   Keep up the great work.    Sincerely,   Clementeen Graham MD  Appended Document: Generic Letter mailed

## 2010-10-29 NOTE — Progress Notes (Signed)
Summary: requesting alternate rx  Phone Note Call from Patient Call back at [phone]   Reason for Call: Talk to Nurse Summary of Call: pt is requesting an alternate rx, sts toprol is discontinued, [facility] Initial call taken by: ERIN LEVAN,  November 18, 2007 3:19 PM  Follow-up for Phone Call        Will forward to MD Follow-up by: ASHA BENTON LPN,  November 18, 2007 3:25 PM  Additional Follow-up for Phone Call Additional follow up Details #1::        Please call in Metoprolol 50mg  by mouth two times a day #60 Refills #11 Additional Follow-up by: Rolm Gala MD,  November 19, 2007 8:18 AM    Additional Follow-up for Phone Call Additional follow up Details #2::    called in to pharmacy Follow-up by: Golden Circle RN,  November 19, 2007 8:57 AM       Appended Document: requesting alternate rx Pt is needing for [place] to resend this rx to her pharmacy because she states they didn't receive it.  She would also like a call once this has been done.  She uses [facility] on [address].  Appended Document: requesting alternate rx Called Rx in [facility] to [facility] at [address]

## 2010-10-29 NOTE — Procedures (Signed)
Summary: Colonoscopy  Patient: Sheila Fernandez Note: All result statuses are Final unless otherwise noted.  Tests: (1) Colonoscopy (COL)   COL Colonoscopy           DONE     [facility]     [address]     [place], [place]  [address]           COLONOSCOPY PROCEDURE REPORT     PATIENT:  [name], [name]  MR#:  [id number]     BIRTHDATE:  21-Sep-1951, 59 yrs. old  GENDER:  female     ENDOSCOPIST:  Rachael Fee, MD     REF. BY:  Eustaquio Boyden, M.D.     PROCEDURE DATE:  04/05/2010     PROCEDURE:  Colonoscopy with biopsy     ASA CLASS:  Class II     INDICATIONS:  Routine Risk Screening     MEDICATIONS:   Fentanyl 50 mcg IV, Versed 7 mg IV     DESCRIPTION OF PROCEDURE:   After the risks benefits and     alternatives of the procedure were thoroughly explained, informed     consent was obtained.  Digital rectal exam was performed and     revealed no rectal masses.   The LB [id number] endoscope     was introduced through the anus and advanced to the cecum, which     was identified by both the appendix and ileocecal valve, without     limitations.  The quality of the prep was good, using MoviPrep.     The instrument was then slowly withdrawn as the colon was fully     examined.     <<PROCEDUREIMAGES>>     FINDINGS:  Mild diverticulosis was found in the ascending colon     (see image3).  There were multiple polyps identified and removed.     Several small, hyperplastic appearing recto-sigmoid polyps were     sampled with forceps (see image4).  External hemorrhoids were     found.  This was otherwise a normal examination of the colon (see     image5, image2, and image1).   Retroflexed views in the rectum     revealed no abnormalities.    The scope was then withdrawn from     the patient and the procedure completed.           COMPLICATIONS:  None           ENDOSCOPIC IMPRESSION:     1) Mild diverticulosis in the ascending colon segment     2) Several small  hyperplastic appearing recto-sigmoid polyps     were sampled with forceps     3) External hemorrhoids     4) Otherwise normal examination           RECOMMENDATIONS:     1) If the polyp(s) removed today are proven to be adenomatous     (pre-cancerous) polyps, you will need a repeat colonoscopy in 5     years. Otherwise you should continue to follow colorectal cancer     screening guidelines for "routine risk" patients with colonoscopy     in 10 years.     2) You will receive a letter within 1-2 weeks with the results     of your biopsy as well as final recommendations. Please call my     office if you have not received a letter after 3 weeks.  ______________________________     Rachael Fee, MD           n.     eSIGNED:   Rachael Fee at 04/05/2010 11:03 AM           Lelan Pons, [phone]  Note: An exclamation mark (!) indicates a result that was not dispersed into the flowsheet. Document Creation Date: 04/05/2010 11:04 AM _______________________________________________________________________  (1) Order result status: Final Collection or observation date-time: 04/05/2010 10:58 Requested date-time:  Receipt date-time:  Reported date-time:  Referring Physician:   Ordering Physician: Rob Bunting [phone]) Specimen Source:  Source: Launa Grill Order Number: [id number] Lab site:   Appended Document: Colonoscopy recall     Procedures Next Due Date:    Colonoscopy: 03/2020

## 2010-10-29 NOTE — Assessment & Plan Note (Signed)
Summary: DNKA            Complete Medication List: 1)  Bayer Aspirin 325 Mg Tabs (Aspirin) .... Take 1 tablet by mouth once a day 2)  Protonix 40 Mg Tbec (Pantoprazole sodium) .... Take 1 tablet by mouth once a day 3)  Toprol Xl 100 Mg Tb24 (Metoprolol succinate) .... Take 1 tablet by mouth once a day 4)  Flexeril 10 Mg Tabs (Cyclobenzaprine hcl) .... Take one before bed as needed for pain 5)  Naprosyn 250 Mg Tabs (Naproxen) .... 2 by mouth x1 then 1 by mouth every 8 hours as needed     ]

## 2010-10-29 NOTE — Letter (Signed)
Summary: Generic Letter  [facility]  [address]   [place], [place] [address]   Phone: [phone]  Fax: [phone]    12/10/2009  AZILE MINARDI [address] [place], [place]  [address]  Dear Ms. Daughdrill,  I have set up your appointment for your pelvic ultra sound that Dr Jennette Kettle ordered.  Your appointment is March 25th at 1015am.  Please arrive at [facility] by 10am at admitting.  They will check you in and show you to Ultra Sound.  You need to arrive with a full bladder.  Please empty your bladder an hour before your appointment time, then drink 32 oz of fluid.  DO NOT empty your bladder again until the ultra sound tech advises you to do so after the test is complete.  Prior Auth from [facility] has been obtained and the information faxed to [facility] along with the orders for the test.  If you have any questions please call Misty Stanley at [phone].  Also you phone number needs to be updates with the office.  Please call the above number to give the office an updated phone number.   Sincerely,   Gladstone Pih

## 2010-10-29 NOTE — Assessment & Plan Note (Signed)
Summary: meet new MD   Vital Signs:  Patient profile:   60 year old female Weight:      174.9 pounds Pulse rate:   66 / minute BP sitting:   149 / 96  (right arm)  Vitals Entered By: Renato Battles slade,cma CC: f/up HTN. Is Patient Diabetic? No Pain Assessment Patient in pain? no        Primary Care Provider:  Eustaquio Boyden  MD  CC:  f/up HTN.Sheila Fernandez  History of Present Illness: CC: mult issues.   60 yo patient new to me but establishedof practice presents for   1. f/u DUB postmenopausal - bx performed last week, TVS showing abnormally thickened endometrial stripe, 3cm posterior fibroid.  awaiting bx results.  2. Back pain - 2 yrs on and off, NSAIDs don't really help, flexeril helps.  3. HTN - BP consistently >140/90, compliant with meds.  Also endorses some SOB.  Stress echo by cardiologist 2009 showing LVH but no overt CHF.  also feels tired very frequently.  h/o anemia, but states reolved with menopause.  has not checked CBC in long period.  Habits & Providers  Alcohol-Tobacco-Diet     Alcohol drinks/day: none     Tobacco Status: quit     Tobacco Counseling: to remain off tobacco products     Year Quit: 2005     Pack years: 15  Exercise-Depression-Behavior     Drug Use: never     Risk analyst Use: always  Current Medications (verified): 1)  Aspirin 81 Mg Chew (Aspirin) 2)  One-A-Day Womens Formula  Tabs (Multiple Vitamins-Calcium) 3)  Lisinopril-Hydrochlorothiazide 10-12.5 Mg Tabs (Lisinopril-Hydrochlorothiazide) .Sheila Fernandez.. 1 By Mouth Once Daily For Blood Pressure  Allergies (verified): 1)  ! Codeine  Past History:  Past medical, surgical, family and social histories (including risk factors) reviewed, and no changes noted (except as noted below).  Past Medical History: Reviewed history from 11/26/2006 and no changes required. h/o anemia, resolved with menopause, hospitalized 05/21/01 -palpitations/CP, lumpectomy 1994- benign  Past Surgical History: Reviewed history  from 11/26/2006 and no changes required. adenosine cardiolite-nml, EF 66% - 04/29/2001, c-section - 09/30/1991, lap chole - 03/30/1999  Family History: Reviewed history from 11/26/2006 and no changes required. Brother died at 31 yo with DM, HTN, kidney dz., father died at 63 with CVA/ prostate ca, HTN, and arthritis, mother died age 65 of pancreatic ca.  Had HTN, Sister died at age 20of cancer, another sister died at age 74 of cancer. Uterine fibroids run in family.  Social History: Reviewed history from 04/13/2009 and no changes required. Laid off 1/02 from [employer] after an injury.  Quit smoking 04/29/01.  working again as of visit 7/2010Drug Use:  never Risk analyst Use:  always  Physical Exam  General:  Well-developed,well-nourished,in no acute distress; alert,appropriate and cooperative throughout examination Lungs:  Normal respiratory effort, chest expands symmetrically. Lungs are clear to auscultation, no crackles or wheezes. Heart:  Normal rate and regular rhythm. S1 and S2 normal without gallop, murmur, click, rub or other extra sounds. Abdomen:  Bowel sounds positive,abdomen soft and non-tender without masses, organomegaly or hernias noted. Msk:  tenderness to palpation over paraspinous muscles on R around thoracic spine.  tenderness with rotation of back.  nontender over spine itself.  tension felt in paraspinous muscles.  Extremities:  tr edema   Impression & Recommendations:  Problem # 1:  HYPERTENSION, BENIGN SYSTEMIC (ICD-401.1) increase ACEI to 20mg  daily.  RTC 2-4 wks for f/u. Her updated medication list for this  problem includes:    Lisinopril-hydrochlorothiazide 20-12.5 Mg Tabs (Lisinopril-hydrochlorothiazide) .Sheila Fernandez... Take one daily  Orders: Basic Met-[facility] [id number]) [facility]- Est  Level 4 ([address]214)  BP today: 149/96 Prior BP: 157/97 (07/13/2009)  Labs Reviewed: K+: 3.8 (04/13/2009) Creat: : 0.93 (04/13/2009)   Chol: 162 (12/09/2007)   HDL: 49 (12/09/2007)   LDL: 90  (12/09/2007)   TG: 114 (12/09/2007)  Problem # 2:  FATIGUE (ICD-780.79) check basic labwork.  RTC 2 wks for f/u. Orders: TSH-[facility] ([id number]) CBC-[facility] (29562) [facility]- Est  Level 4 (13086)  Problem # 3:  POSTMENOPAUSAL BLEEDING (ICD-627.1)  discussed TVS results, still awaiting bx.  Orders: [facility]- Est  Level 4 (57846)  Problem # 4:  BACK STRAIN (ICD-847.9)  treating with flexeril trial.  RTC for further eval.  Orders: [facility]- Est  Level 4 (96295)  Complete Medication List: 1)  Aspirin 81 Mg Chew (Aspirin) 2)  One-a-day Womens Formula Tabs (Multiple vitamins-calcium) 3)  Lisinopril-hydrochlorothiazide 20-12.5 Mg Tabs (Lisinopril-hydrochlorothiazide) .... Take one daily 4)  Flexeril 10 Mg Tabs (Cyclobenzaprine hcl) .... Take one by mouth two times a day as needed muscle spasm  Other Orders: Influenza Vaccine NON MCR (28413)  Patient Instructions: 1)  Return in 2-4 wks for f/u. 2)  We drew blood work today to check several values.  We will contact you with the results of your test.   Still waiting on the endometrial biopsy. 3)  Flu shot today. 4)  Release of information for Dr. Lowell Guitar (GYN) sent today. Prescriptions: FLEXERIL 10 MG TABS (CYCLOBENZAPRINE HCL) take one by mouth two times a day as needed muscle spasm  #30 x 0   Entered and Authorized by:   Eustaquio Boyden  MD   Signed by:   Eustaquio Boyden  MD on 07/19/2009   Method used:   Electronically to        [facility] [address] (retail)       [phone]       [place], [place]  [address]       Ph: [phone]       Fax: [phone]   RxID:   [id number] LISINOPRIL-HYDROCHLOROTHIAZIDE 20-12.5 MG TABS (LISINOPRIL-HYDROCHLOROTHIAZIDE) take one daily  #30 x 3   Entered and Authorized by:   Eustaquio Boyden  MD   Signed by:   Eustaquio Boyden  MD on 07/19/2009   Method used:   Electronically to        [facility] [address] (retail)       [phone]        [place], [place]  [address]       Ph: [phone]       Fax: [phone]   RxID:   [id number] FLEXERIL 10 MG TABS (CYCLOBENZAPRINE HCL) take one by mouth two times a day as needed muscle spasm  #30 x 0   Entered and Authorized by:   Eustaquio Boyden  MD   Signed by:   Eustaquio Boyden  MD on 07/19/2009   Method used:   Electronically to        [facility] [address] [id number] (retail)       [address] [place], [place]  [address]       Ph: [phone]       Fax: [phone]   RxID:   [id number]    Prevention & Chronic Care Immunizations   Influenza  vaccine: Fluvax Non-MCR  (07/19/2009)    Tetanus booster: 08/29/2001: Done.   Tetanus booster due: 08/30/2011    Pneumococcal vaccine: Not documented  Colorectal Screening   Hemoccult: Done.  (08/29/2006)    Colonoscopy: Not documented  Other Screening   Pap smear: Done.  (08/29/2006)    Mammogram: Done.  (10/30/2005)   Smoking status: quit  (07/19/2009)  Lipids   Total Cholesterol: 162  (12/09/2007)   LDL: 90  (12/09/2007)   LDL Direct: Not documented   HDL: 49  (12/09/2007)   Triglycerides: 114  (12/09/2007)  Hypertension   Last Blood Pressure: 149 / 96  (07/19/2009)   Serum creatinine: 0.93  (04/13/2009)   Serum potassium 3.8  (04/13/2009)  Self-Management Support :    Hypertension self-management support: Not documented   Nursing Instructions: Give Flu vaccine today     Immunizations Administered:  Influenza Vaccine # 1:    Vaccine Type: Fluvax Non-MCR    Site: right deltoid    Mfr: GlaxoSmithKline    Dose: 0.5 ml    Route: IM    Given by: Tessie Fass CMA    Exp. Date: 03/28/2010    Lot #: [id number]    VIS given: 05/08/2009  Flu Vaccine Consent Questions:    Do you have a history of severe allergic reactions to this vaccine? no    Any prior history of allergic reactions to egg and/or gelatin? no    Do you have a sensitivity to the preservative Thimersol?  no    Do you have a past history of Guillan-Barre Syndrome? no    Do you currently have an acute febrile illness? no    Have you ever had a severe reaction to latex? no    Vaccine information given and explained to patient? yes    Are you currently pregnant? no

## 2010-10-31 NOTE — Miscellaneous (Signed)
  Clinical Lists Changes  Problems: Removed problem of SPECIAL SCREENING FOR MALIGNANT NEOPLASMS COLON (ICD-V76.51)

## 2010-11-14 ENCOUNTER — Other Ambulatory Visit: Payer: Self-pay | Admitting: Family Medicine

## 2010-11-14 DIAGNOSIS — Z1231 Encounter for screening mammogram for malignant neoplasm of breast: Secondary | ICD-10-CM

## 2010-11-25 ENCOUNTER — Ambulatory Visit
Admission: RE | Admit: 2010-11-25 | Discharge: 2010-11-25 | Disposition: A | Discharge status: Home / Self Care | Payer: Medicaid Other | Source: Ambulatory Visit | Attending: *Deleted | Admitting: *Deleted

## 2010-11-25 DIAGNOSIS — Z1231 Encounter for screening mammogram for malignant neoplasm of breast: Secondary | ICD-10-CM

## 2010-12-23 ENCOUNTER — Ambulatory Visit (INDEPENDENT_AMBULATORY_CARE_PROVIDER_SITE_OTHER): Payer: Medicaid Other | Admitting: Family Medicine

## 2010-12-23 DIAGNOSIS — M25562 Pain in left knee: Secondary | ICD-10-CM | POA: Insufficient documentation

## 2010-12-23 DIAGNOSIS — M25569 Pain in unspecified knee: Secondary | ICD-10-CM

## 2010-12-23 DIAGNOSIS — I10 Essential (primary) hypertension: Secondary | ICD-10-CM

## 2010-12-23 MED ORDER — AMLODIPINE BESYLATE 5 MG PO TABS: 10.0000 mg | ORAL_TABLET | Freq: Every day | ORAL | Status: DC

## 2010-12-23 MED ORDER — TRAMADOL HCL 50 MG PO TABS: 50.0000 mg | ORAL_TABLET | Freq: Four times a day (QID) | ORAL | Status: DC | PRN

## 2010-12-23 NOTE — Patient Instructions (Signed)
Follow up with Dr. Denyse Amass in 3-4 weeks.  Wear and Tear Disorders of the Knee Arthritis, Osteoarthritis Everyone will experience wear and tear injuries (arthritis, osteoarthritis) of the knee. These are the changes we all get as we age. They come from the joint stress of daily living. The amount of cartilage damage in your knee and your symptoms determine if you need surgery. Mild problems require approximately two months recovery time. More severe problems take several months to recover. With mild problems, your surgeon may find worn and rough cartilage surfaces. With severe changes, your surgeon may find cartilage that has completely worn away and exposed the bone. Loose bodies of bone and cartilage, bone spurs (excess bone growth), and injuries to the menisci (cushions between the large bones of your leg) are also common. All of these problems can cause pain. For a mild wear and tear problem, rough cartilage may simply need to be shaved and smoothed. For more severe problems with areas of exposed bone, your surgeon may use an instrument for roughing up the bone surfaces to stimulate new cartilage growth. Loose bodies are most often removed. Torn menisci may be trimmed or repaired.  ABOUT THE ARTHROSCOPIC PROCEDURE Arthroscopy is a surgical technique. It allows your orthopedic surgeon to diagnose and treat your knee injury with accuracy. The surgeon looks into your knee through a small scope. The scope is like a small (pencil-sized) telescope. Arthroscopy is less invasive than open knee surgery. You can expect a more rapid recovery. After the procedure, you will be moved to a recovery area until most of the effects of the medication have worn off. Your caregiver will discuss the test results with you. RECOVERY The severity of the arthritis and the type of procedure performed will determine recovery time. Other important factors include age, physical condition, medical conditions, and the type of  rehabilitation program. Strengthening your muscles after arthroscopy helps guarantee a better recovery. Follow your caregiver's instructions. Use crutches, rest, elevate, ice, and do knee exercises as instructed. Your caregivers will help you and instruct you with exercises and other physical therapy required to regain your mobility, muscle strength, and functioning following surgery. Only take over-the-counter or prescription medicines for pain, discomfort, or fever as directed by your caregiver.  SEEK MEDICAL CARE IF:  There is increased bleeding (more than a small spot) from the wound.   You notice redness, swelling, or increasing pain in the wound.   Pus is coming from wound.   You develop an unexplained oral temperature above 100.4 , or as your caregiver suggests.   You notice a foul smell coming from the wound or dressing.   You have severe pain with motion of the knee.  SEEK IMMEDIATE MEDICAL CARE IF:  You develop a rash.   You have difficulty breathing.   You have any allergic problems.  MAKE SURE YOU:   Understand these instructions.   Will watch your condition.   Will get help right away if you are not doing well or get worse.  Document Released: 09/12/2000 Document Re-Released: 08/28/2008 [facility] Patient Information 2011 [place], [place].

## 2010-12-23 NOTE — Progress Notes (Signed)
  Subjective:    Patient ID: Sheila Fernandez, female    DOB: August 26, 1951, 60 y.o.   MRN: [id number]  HPI  1. Knee Pain: Left. Worse over past several weeks, but seems that it is chronic with flares. No increased exercise or trauma. Pain generalized, with swelling, sometimes the feeling that her knee will "give out," and with popping (escpecially going up and down stairs). Patient with other joint pain - low back, cervical, sometimes right knee.  2. HTN: Chronic. Patient concerned that her SBP is consistently 140s and 150s. No CP, SOB, HA, dizziness, LE edema.  Review of Systems See HPI.    Objective:   Physical Exam  Vitals reviewed. Constitutional: She appears well-developed and well-nourished. No distress.  Cardiovascular: Normal rate, regular rhythm and intact distal pulses.   Pulmonary/Chest: Effort normal and breath sounds normal.  Musculoskeletal:       Left knee: She exhibits decreased range of motion and swelling. She exhibits no effusion, no ecchymosis, no deformity, no laceration, no erythema, normal alignment, no LCL laxity, normal patellar mobility, no bony tenderness and no MCL laxity. tenderness found. Medial joint line tenderness noted.          Assessment & Plan:

## 2010-12-24 NOTE — Assessment & Plan Note (Signed)
No red flags. 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 Discussed options with patient, including knee injection today, oral pain medication for flare, referral to Ortho for possible intervention. Patient opted for pain medication today. Reviewed other recommendations for OA pain control. Patient to f/u with PCP in 2-3 weeks to recheck knee. She states that she might consider an injection at that time.

## 2010-12-24 NOTE — Assessment & Plan Note (Signed)
Nearly at goal. HR 80s. Will increase Norvasc today. Patient to f/u with PCP in 2-3 weeks.

## 2011-01-09 ENCOUNTER — Inpatient Hospital Stay (INDEPENDENT_AMBULATORY_CARE_PROVIDER_SITE_OTHER)
Admission: RE | Admit: 2011-01-09 | Discharge: 2011-01-09 | Disposition: A | Discharge status: Home / Self Care | Payer: Medicaid Other | Source: Ambulatory Visit | Attending: Family Medicine | Admitting: Family Medicine

## 2011-01-09 DIAGNOSIS — I10 Essential (primary) hypertension: Secondary | ICD-10-CM

## 2011-01-09 DIAGNOSIS — H209 Unspecified iridocyclitis: Secondary | ICD-10-CM

## 2011-01-13 ENCOUNTER — Ambulatory Visit: Payer: Medicaid Other | Admitting: Family Medicine

## 2011-01-13 ENCOUNTER — Ambulatory Visit (INDEPENDENT_AMBULATORY_CARE_PROVIDER_SITE_OTHER): Payer: Medicaid Other | Admitting: Family Medicine

## 2011-01-13 DIAGNOSIS — M25569 Pain in unspecified knee: Secondary | ICD-10-CM

## 2011-01-13 DIAGNOSIS — M25562 Pain in left knee: Secondary | ICD-10-CM

## 2011-01-13 DIAGNOSIS — H20029 Recurrent acute iridocyclitis, unspecified eye: Secondary | ICD-10-CM

## 2011-01-13 NOTE — Patient Instructions (Signed)
Please come back to see your PCP in the next few weeks.  You got a prescription for heel cups today.

## 2011-01-16 DIAGNOSIS — H20029 Recurrent acute iridocyclitis, unspecified eye: Secondary | ICD-10-CM | POA: Insufficient documentation

## 2011-01-16 NOTE — Assessment & Plan Note (Addendum)
Pt says that she has had this off and on for the last several years. Her eye MD has suggested that there may be an underlying autoimmune disease or other disease worth looking for in the future. She has a daughter with lupus. She does not have any of the finding of lupus except HTN and joint pains (which she has had for years and was told was arthritis). She recently was seen at the [facility] but her iritis has resolved and she is back to baseline. Advised her to follow up with her PCP to discuss further testing for diseases but that without more symptoms there may not be many tests he wants to do at this time. She will discuss with PCP.

## 2011-01-16 NOTE — Progress Notes (Signed)
Iritis: Pt was just seen at the [facility] for iritis. No trauma she just woke up with a Rt bloodshot eye.  She says that it is a recurrent thing and she has been told by her eye MD that she needs to be checked for other causes because it usually comes along with autoimmune type diseases. She has a Lobbyist with Lupus but has never had any of the skin symptoms (racial rash) or other symptoms. She did not have blurry vision. She does have some joint pains but has been told she has arthritis.   Knee pain:  PT has some left knee pain and was seen by her PCP for the pain. She was given an Rx for heel cups but has lost it and her pain continues. Wants to know if she can get another pair.   ROS: neg except asnoted above and + elevated BP today.   PE:  Gen : NAD HEENT: eyes appear normal now. EOMI, PERRL Left knee: no abnormalities but pt does feel some pain in left knee and heel.

## 2011-01-16 NOTE — Assessment & Plan Note (Signed)
PT was given heel cup Rx at her last visit with her PCP but she lost the Rx. Another Rx given today. PT will go to the med supply store to get them.

## 2011-02-03 ENCOUNTER — Ambulatory Visit (INDEPENDENT_AMBULATORY_CARE_PROVIDER_SITE_OTHER): Payer: Medicaid Other | Admitting: Family Medicine

## 2011-02-03 ENCOUNTER — Encounter: Payer: Self-pay | Admitting: Family Medicine

## 2011-02-03 ENCOUNTER — Ambulatory Visit (HOSPITAL_COMMUNITY): Payer: Medicaid Other | Attending: Family Medicine

## 2011-02-03 DIAGNOSIS — R079 Chest pain, unspecified: Secondary | ICD-10-CM

## 2011-02-03 DIAGNOSIS — I10 Essential (primary) hypertension: Secondary | ICD-10-CM

## 2011-02-03 DIAGNOSIS — H20029 Recurrent acute iridocyclitis, unspecified eye: Secondary | ICD-10-CM

## 2011-02-03 MED ORDER — NITROGLYCERIN 0.4 MG SL SUBL: 0.4000 mg | SUBLINGUAL_TABLET | SUBLINGUAL | Status: DC | PRN

## 2011-02-03 MED ORDER — LOSARTAN POTASSIUM-HCTZ 100-25 MG PO TABS: 1.0000 | ORAL_TABLET | Freq: Two times a day (BID) | ORAL | Status: DC

## 2011-02-03 MED ORDER — METOPROLOL TARTRATE 50 MG PO TABS: 50.0000 mg | ORAL_TABLET | Freq: Two times a day (BID) | ORAL | Status: DC

## 2011-02-03 MED ORDER — LOSARTAN POTASSIUM-HCTZ 100-25 MG PO TABS: 0.5000 | ORAL_TABLET | Freq: Two times a day (BID) | ORAL | Status: DC

## 2011-02-03 NOTE — Progress Notes (Signed)
1) Chest pain: Present for several weeks. Gradual. Location of pain is substernal to left chest. Radiates to the left arm.  Not associated with exercise or food. Not better with rest. No palpitations. Does note dyspnea with exertion. No orthopnea, or PND. No leg swelling or calf pain. Feels well otherwise. Notes that she had a stress exam 1 year ago at [facility] cards.   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 Was seen by Optho 2 weeks ago and was asked to f/u with PCP for inflammatory disease workup. She notes occasional and intermittent back pain. Notes an occasional rash over her upper arms but denies any weakness in her central muscles (standing, climbing stairs, combing or drying hair). She additionally denies any other persistent rash. She feels well mostly. Agrees to limited workup today.   3) Hypertension: Checks home BP and notes elevation over the past weeks. Home BP is often in 150s. Is taking medications as listed in note. See chest pain for other ROS factors.   PMH: reviewed.  ROS see HPI otherwise normal.   Exam:  Vs noted.  Gen: Well NAD Lungs: CTABL Nl WOB Heart: RRR no MRG Abd: NABS, NT, ND Exts: Non edematous BL  LE. No nail pitting.  Skin: No rash noted.  MSK: No joint effusion or pain. Gait normal.   EKG: Rate sinus at 59. No ST segment changes.

## 2011-02-03 NOTE — Assessment & Plan Note (Signed)
Currently well.  Optho thinks is due to underlying inflammatory disorder and would like further workup. History is not specific for any currently bad inflammatory disorder.  Plan: Assess with ESR, ANA, RF, and HLA B 27. Will follow up in 1 month.

## 2011-02-03 NOTE — Assessment & Plan Note (Signed)
Elevated today and at home.  On ARB, HCTZ, CCB, and Beta blocker.  Plan to increase metoporol to 50mg  bid.  Gave precautions for hypotension and bradycardia.  Will f/u in 1 month or sooner.  Checking BMP today.

## 2011-02-03 NOTE — Assessment & Plan Note (Addendum)
Gradual: Has had workup with stress exam 1 year ago. EKG does not show current or past MI. History is concerning for stable angina.  Low risk for PE today with gradual intermittent symptoms and no calf swelling or pain. EKG also shows bradycardia.  Plan: Refer to cardiology for repeat stress test. Will also refill NTG tablets.  F/u in 1 month.  Red flags reviewed.

## 2011-02-03 NOTE — Patient Instructions (Signed)
Thank you for coming in today. Take 1/2 tab of Hyzaar twice a day or take a full tab once a day.  Come back in 1 month.  Let me know if your chest pain worsens. See handout.

## 2011-02-04 LAB — BASIC METABOLIC PANEL WITH GFR
BUN: 12 mg/dL (ref 6–23)
CO2: 27 mEq/L (ref 19–32)
Calcium: 9.6 mg/dL (ref 8.4–10.5)
Chloride: 105 mEq/L (ref 96–112)
Creat: 0.65 mg/dL (ref 0.40–1.20)
GFR, Est African American: >60 mL/min (ref >60)
GFR, Est Non African American: >60 mL/min (ref >60)
Glucose, Bld: 88 mg/dL (ref 70–99)
Potassium: 3.9 mEq/L (ref 3.5–5.3)
Sodium: 141 mEq/L (ref 135–145)

## 2011-02-04 LAB — RHEUMATOID FACTOR: Rhuematoid fact SerPl-aCnc: 10 IU/mL (ref <=14)

## 2011-02-04 LAB — ANA: Anti Nuclear Antibody(ANA): NEGATIVE

## 2011-02-04 LAB — SEDIMENTATION RATE: Sed Rate: 27 mm/hr — ABNORMAL HIGH (ref 0–22)

## 2011-02-05 ENCOUNTER — Telehealth: Payer: Self-pay | Admitting: Family Medicine

## 2011-02-05 LAB — HLA-B27 ANTIGEN

## 2011-02-05 NOTE — Telephone Encounter (Signed)
Called and left a message about lab results.

## 2011-02-14 NOTE — Discharge Summary (Signed)
[place]. [facility]  Patient:    Sheila Fernandez, PIROZZI Visit Number: [id number] MRN: [id number]          Service Type: MED Location: [phone] Attending Physician:  McDiarmid, Leighton Roach. Dictated by:   Michell Heinrich, M.D. Admit Date:  05/21/2001 Discharge Date: 05/24/2001                             Discharge Summary  ADMISSION DIAGNOSES: 1. Palpitations. 2. Atypical chest pain.  DISCHARGE DIAGNOSES: 1. Palpitations. 2. Atypical chest pain.  DISCHARGE MEDICATIONS: 1. Lopressor 25 mg p.o. b.i.d. 2. Iron sulfate 325 mg t.i.d. 3. Nitroglycerin 0.4 mg sublingual every 15 minutes as needed for chest pain    up to 3 in a row. 4. Altace 2.5 mg a day. 5. Aspirin 81 mg a day.  CHRONIC PROBLEM LIST: 1. Hypertension. 2. Anemia. 3. Atypical chest pain. 4. History of laparoscopic cholecystectomy. 5. History of C-section. 6. History of a lumpectomy.  CONSULTATIONS:  Cardiology.  PROCEDURES:  Adenosine Cardiolite on May 24, 2001.  Impression was normal study, no ischemia.  Resting LV ejection fraction 66%.  HISTORY AND PHYSICAL:  For complete H&P, please see resident H&P in chart. Briefly, this is a 60 year old woman with known hypertension who presented with complaint of palpitations and subsequent atypical chest pain.  With initial evaluation, the patient was slightly hypertensive and pulse was in the 50-60 range.  Physical exam was unremarkable.  EKG showed T wave inversion in V1, which was unchanged from July 2000.  Normal sinus rhythm and no acute ischemic changes were seen.  Initial hemoglobin 9.9 with MCV of 77. Electrolytes and BUN and creatinine were normal.  Initial cardiac enzymes revealed CK 96, CK-MB 0.3, and troponin-I 0.3.  All three of these were repeated to verify and the repeat was CK 72, CK-MB less than 0.3, and troponin-I 0.3.  The patient was admitted for further workup of her palpitations and chest pain.  HOSPITAL  COURSE:  Cardiac.  After admission to the floor, the patient denied any further palpitations or chest pain.  Telemetry revealed normal sinus rhythm alternating with sinus bradycardia in the 50-60 range.  Given the patients risk factors, she was at intermediate probability of coronary artery disease and, with her slightly elevated troponin levels, warranted a cardiology consult for their opinion on outpatient versus inpatient risk stratification for coronary artery disease.  Cardiology evaluated the patient on May 23, 2001.  They recommended continuing Lopressor and Altace and recommended an adenosine Cardiolite stress test for further risk stratification.  This was performed on May 24, 2001 and results showed no ischemia with an ejection fraction of 66%.  No further workup was needed and the patient was discharged home in improved condition on May 24, 2001 with the previously mentioned discharge medications.  The patient had subsequent measurements of her CK, CK-MB, and troponin-I.  All CK and CK-MBs were negative; however, the troponin-I measurements were 0.3, 0.3, 0.32, and 0.29.  DISCHARGE INSTRUCTIONS:  Instructions were to follow a low-salt and low-fat diet and there were no restrictions on her activity.  She was to follow up with Dr. Wilson Singer at [facility] within 1-2 weeks and she was provided with this contact number. Dictated by:   Michell Heinrich, M.D. Attending Physician:  McDiarmid, Tawanna Cooler D. DD:  06/17/01 TD:  06/17/01 Job: [id number]

## 2011-02-14 NOTE — H&P (Signed)
[place]. [facility]  Patient:    Sheila Fernandez, Sheila Fernandez Visit Number: [id number] MRN: [id number]          Service Type: MED Location: [phone] Attending Physician:  McDiarmid, Leighton Roach. Dictated by:   Nicoletta Ba, M.D. Adm. Date:  05/21/2001                           History and Physical  SERVICE:  [facility]  CHIEF COMPLAINT:  Palpitations.  HISTORY OF PRESENT ILLNESS:  Sheila Fernandez is a 60 year old African-American female who has a history of hypertension and iron deficiency anemia, who presents with a one-day history of intermittent palpitations associated with substernal chest pressure but no pain; does also associate shortness of breath and nausea with these episodes that last about one minute.  Does say she is most uncomfortable when lying down but these palpitations occur regardless of position.  Has not had any history of palpitations in the past.  Denies any prolonged travel recently or recent surgeries.  No history of PE or DVT.  Does not think that she has been under an unusual amount of stress in the past. Reports drinking approximately one caffeinated drink per day.  PAST MEDICAL HISTORY: 1. Hypertension -- had been on Lopressor 25 mg per day but was recently    discontinued for unsure reason. 2. Anemia.  Was told that this was apparently iron deficiency anemia. 3. Atypical chest pain in 2000.  Had sharp substernal chest pain and presented    to her primary care doctor.  Ended up getting an exercise treadmill test    which was negative as per her report.  Has not had significant chest pain    since.  PAST SURGICAL HISTORY: 1. Laparoscopic cholecystectomy in July 2000. 2. C-section in 1993. 3. Lumpectomy in January of 1994 that was benign.  MEDICATIONS: 1. Iron sulfate 325 mg t.i.d., started one week ago. 2. Lopressor 25 mg per day but has not been taking this.  ALLERGIES:  CODEINE causes nausea and  vomiting.  SOCIAL HISTORY:  Ten-pack-year history of tobacco and reports quitting two weeks ago.  Has an occasional alcoholic drink; the last one was one week ago. Denies any illicit drug use.  She is employed part-time as a Conservation officer, nature at [employer].  FAMILY HISTORY:  Denies coronary artery disease.  Mother died of pancreatic cancer.  Father had hypertension and prostate cancer.  Siblings have hypertension and diabetes.  REVIEW OF SYSTEMS:  No fever or chills.  No cough.  No melena.  No hematochezia.  Does report still having menses with heavy bleeding for two days and moderate bleeding for three more days.  PHYSICAL EXAMINATION:  VITAL SIGNS:  The temperature was 97.8, pulse 50 to 75, blood pressure 149/101, respirations 18.  O2 saturation 100% on room air.  GENERAL:  Well-appearing, in no apparent distress, alert and oriented x 4.  HEENT:  Pupils equal, round and reactive to light and accommodation. Extraocular movements intact.  Oropharynx without erythema or exudate, mucous membranes moist and pink.  NECK:  Supple.  No lymphadenopathy.  No thyromegaly.  LUNGS:  Clear to auscultation bilaterally.  Nonlabored respirations.  CARDIOVASCULAR:  Regular rhythm and rate.  No murmur, rub or gallop. (Occasional sinus bradycardia.)  ABDOMEN:  Soft, nontender, nondistended.  Bowel sounds normoactive.  No hepatosplenomegaly.  No masses.  EXTREMITIES:  No clubbing, cyanosis, or edema.  RECTAL:  Heme-negative and  normal tone and no stool in the vault as per the P.A. in the emergency department.  NEUROLOGIC:  No focal deficits.  LABORATORY DATA:  EKG showed T wave inversion in lead V1, which was unchanged from July of 2000, and otherwise normal sinus rhythm and no acute ischemic changes.  CK was 96, MB 0.3, troponin I 0.3.  Repeat of these values, of the CK, CK-MB and troponin I, were all confirmatory of the first measurements.  WBC count 6.6, hemoglobin 9.9, MCV of 77,  platelets of 383,000.  An i-STAT 8 revealed a sodium of 141, potassium 4.3, chloride 108, bicarb 26, BUN 11, creatinine 0.8, glucose 92.  Chest x-ray showed no acute distress.  ASSESSMENT AND PLAN:  [age]-year-old African-American female with palpitations and associated chest pressure which is intermittent.  1. Palpitations with substernal pressure.  Differential includes paroxysmal    atrial fibrillation/flutter/supraventricular tachycardia; could also be    acute pulmonary syndrome, uncontrolled hypertensive heart disease,    myocarditis or pericarditis.  Favor paroxysmal atrial fibrillation or    supraventricular tachycardia or poorly controlled hypertensive heart    disease.  Consider echocardiogram.  Will continue with aspirin and    telemetry observation and will follow troponin to see that it starts    declining.  Will discuss further risk stratification with team.  Will    follow up thyroid-stimulating hormone and lipids done in the clinic. 2. Heme.  Iron deficiency anemia likely secondary to menstrual blood loss.    Continue iron supplement.  Of note, stools are heme-negative. 3. Hypertension.  Needs to be restarted on an antihypertensive.  Heart rate is    in the 50s in the emergency department, so we will hold on the beta    blocker.  She has tried hydrochlorothiazide in the past and stopped for an    unknown reason.  I will start an ACE inhibitor low dose today. Dictated by:   Nicoletta Ba, M.D. Attending Physician:  Acquanetta Belling D. DD:  05/22/01 TD:  05/24/01 Job: [id number]

## 2011-02-19 ENCOUNTER — Encounter: Payer: Self-pay | Admitting: Cardiovascular Disease

## 2011-02-19 ENCOUNTER — Ambulatory Visit (INDEPENDENT_AMBULATORY_CARE_PROVIDER_SITE_OTHER): Payer: Medicaid Other | Admitting: Cardiovascular Disease

## 2011-02-19 DIAGNOSIS — I10 Essential (primary) hypertension: Secondary | ICD-10-CM

## 2011-02-19 DIAGNOSIS — M1990 Unspecified osteoarthritis, unspecified site: Secondary | ICD-10-CM | POA: Insufficient documentation

## 2011-02-19 DIAGNOSIS — R079 Chest pain, unspecified: Secondary | ICD-10-CM

## 2011-02-19 MED ORDER — NITROGLYCERIN 0.4 MG SL SUBL: 0.4000 mg | SUBLINGUAL_TABLET | SUBLINGUAL | Status: DC | PRN

## 2011-02-19 MED ORDER — CLONIDINE HCL 0.2 MG PO TABS: 0.2000 mg | ORAL_TABLET | Freq: Three times a day (TID) | ORAL | Status: DC

## 2011-02-19 NOTE — Progress Notes (Signed)
Jaiyah L Rakers Date of Birth  [birth year] [facility] [address]     [address] [place], [place]  [address] [phone]  Fax  [phone]  History of Present Illness:  Middle age female , hx of dull chest pain, radiates to her left arm.  Lasts 20 -30 minutes.  Not associated with any activity.  No worsened by walking or exercise.  Have been going on for several years - the intensity has not worsened.  Pains may be eased slightly with NTG.  She has had a tough time controlling her BP.  Current Outpatient Prescriptions on File Prior to Visit  Medication Sig Dispense Refill  . amLODipine (NORVASC) 5 MG tablet Take 2 tablets (10 mg total) by mouth daily.  30 tablet  0  . aspirin 81 MG chewable tablet Chew 81 mg by mouth daily.        [name] losartan-hydrochlorothiazide (HYZAAR) 100-25 MG per tablet Take 0.5 tablets by mouth 2 (two) times daily.  30 tablet  6  . metoprolol tartrate (LOPRESSOR) 50 MG tablet Take 1 tablet (50 mg total) by mouth 2 (two) times daily.  60 tablet  6  . nitroGLYCERIN (NITROSTAT) 0.4 MG SL tablet Place 1 tablet (0.4 mg total) under the tongue every 5 (five) minutes as needed. Call doctor if used  90 tablet  1  . DISCONTD: Multiple Vitamins-Calcium (ONE-A-DAY WOMENS FORMULA) TABS Take by mouth.        . DISCONTD: omeprazole (PRILOSEC) 20 MG capsule Take 20 mg by mouth daily.        [name] DISCONTD: traMADol (ULTRAM) 50 MG tablet Take 1 tablet (50 mg total) by mouth every 6 (six) hours as needed for pain.  20 tablet  0    Allergies  Allergen Reactions  . Codeine     Past Medical History  Diagnosis Date  . Adenomyosis   . Uterine fibroid   . Hypertension   . Iritis, recurrent   . Arthritis     Past Surgical History  Procedure Date  . Cesarean section   . Cholecystectomy   . Breast lumpectomy   . Foot surgery     History  Smoking status  . Former Smoker  . Quit date: 11/28/2003  Smokeless tobacco  . Never Used      History  Alcohol Use No    Family History  Problem Relation Age of Onset  . Cancer Mother 5    Pancreatic  . Hypertension Mother   . Stroke Father   . Cancer Father 60    Prostate  . Hypertension Father   . Prostate cancer Father   . Cancer Sister 60    2 sisters d/c cancer less than 40yo  . Diabetes Sister   . Hypertension Sister   . Diabetes Daughter   . Early death Daughter   . Hypertension Daughter   . Kidney disease Daughter   . Diabetes Brother   . Hypertension Brother   . Prostate cancer Brother     Reviw of Systems:  Reviewed in the HPI.  All other systems are negative.  Physical Exam: BP 160/118  Pulse 90  Ht 5\' 2"  (1.575 m)  Wt 176 lb (79.833 kg)  BMI 32.19 kg/m2 The patient is alert and oriented x 3.  The mood and affect are normal.  The skin is warm and dry.  Color is normal.  The HEENT exam reveals that the sclera are nonicteric.  The mucous  membranes are moist.  The carotids are 2+ without bruits.  There is no thyromegaly.  There is no JVD.  The lungs are clear.  The chest wall is non tender.  The heart exam reveals a regular rate with a normal S1 and S2.  There are no murmurs, gallops, or rubs.  The PMI is not displaced.   Abdominal exam reveals good bowel sounds.  There is no guarding or rebound.  There is no hepatosplenomegaly or tenderness.  There are no masses.  Exam of the legs reveal no clubbing, cyanosis, or edema.  The legs are without rashes.  The distal pulses are intact.  Cranial nerves II - XII are intact.  Motor and sensory functions are intact.  The gait is normal.  ECG:  Assessment / Plan:

## 2011-02-19 NOTE — Assessment & Plan Note (Signed)
Radiah Presents with episodes of chest pain. She has some typical and some atypical features with these chest pains. He does have a history of hypertension for many years and it has been poorly controlled recently. I like to perform a stress Myoview study for further evaluation of her chest pains. I've refilled her nitroglycerin.

## 2011-02-19 NOTE — Assessment & Plan Note (Signed)
The blood pressure remains moderately elevated. I've added clonidine 0.2 mg 3 times a day. She will followup with her medical doctor and with me very soon.

## 2011-03-03 ENCOUNTER — Ambulatory Visit (HOSPITAL_COMMUNITY): Payer: Medicaid Other | Attending: Cardiovascular Disease | Admitting: Radiology

## 2011-03-03 DIAGNOSIS — R0609 Other forms of dyspnea: Secondary | ICD-10-CM

## 2011-03-03 DIAGNOSIS — R0989 Other specified symptoms and signs involving the circulatory and respiratory systems: Secondary | ICD-10-CM

## 2011-03-03 DIAGNOSIS — R079 Chest pain, unspecified: Secondary | ICD-10-CM

## 2011-03-03 MED ORDER — TECHNETIUM TC 99M TETROFOSMIN IV KIT
33.0000 | PACK | Freq: Once | INTRAVENOUS | Status: AC | PRN
  Administered 2011-03-03: 33 via INTRAVENOUS

## 2011-03-03 MED ORDER — REGADENOSON 0.4 MG/5ML IV SOLN
0.4000 mg | Freq: Once | INTRAVENOUS | Status: AC
  Administered 2011-03-03: 0.4 mg via INTRAVENOUS

## 2011-03-03 MED ORDER — TECHNETIUM TC 99M TETROFOSMIN IV KIT
11.0000 | PACK | Freq: Once | INTRAVENOUS | Status: AC | PRN
  Administered 2011-03-03: 11 via INTRAVENOUS

## 2011-03-03 NOTE — Progress Notes (Addendum)
[facility] SITE 3 NUCLEAR MED [address] [place] [address] [phone]  Cardiology Nuclear Med Study  Jodiann LALAH DURANGO is a 60 y.o. female [id number] 1951/04/24   Nuclear Med Background Indication for Stress Test:  Evaluation for Ischemia History: 10/12/09 Myocardial Perfusion Study EF 87% NL Cardiac Risk Factors: History of Smoking and Hypertension  Symptoms:  Chest Pain   Nuclear Pre-Procedure Caffeine/Decaff Intake:  None NPO After: 7:00pm   Lungs:  clear IV 0.9% NS with Angio Cath:  22g  IV Site: R Hand  IV Started by:  Irean Hong, RN  Chest Size (in):  36 Cup Size: C  Height: 5\' 2"  (1.575 m)  Weight:  174 lb (78.926 kg)  BMI:  Body mass index is 31.83 kg/(m^2). Tech Comments:  Held metoprolol x 36 hrs.    Nuclear Med Study 1 or 2 day study: 1 day  Stress Test Type:  Treadmill/Lexiscan  Reading MD: Kristeen Miss, MD  Order Authorizing Provider:  P.Levia Waltermire  Resting Radionuclide: Technetium 31m Tetrofosmin  Resting Radionuclide Dose: 11.0 mCi   Stress Radionuclide:  Technetium 82m Tetrofosmin  Stress Radionuclide Dose: 33.0 mCi           Stress Protocol Rest HR: 64 Stress HR: 125  Rest BP: 147/104 Stress BP: 177/98  Exercise Time (min): n/a METS: n/a   Predicted Max HR: 160 bpm % Max HR: 78.12 bpm Rate Pressure Product: 21308   Dose of Adenosine (mg):  n/a Dose of Lexiscan: 0.4 mg  Dose of Atropine (mg): n/a Dose of Dobutamine: n/a mcg/kg/min (at max HR)  Stress Test Technologist: Milana Na, EMT-P  Nuclear Technologist:  Domenic Polite, CNMT     Rest Procedure:  Myocardial perfusion imaging was performed at rest 45 minutes following the intravenous administration of Technetium 41m Tetrofosmin. Rest ECG: NSR  Stress Procedure:  The patient received IV Lexiscan 0.4 mg over 15-seconds with concurrent low level exercise and then Technetium 51m Tetrofosmin was injected at 30-seconds while the patient continued walking one  more minute.  There were no significant changes with Lexiscan.  Quantitative spect images were obtained after a 45-minute delay. Stress ECG: No significant change from baseline ECG  QPS Raw Data Images:  Normal; no motion artifact; normal heart/lung ratio. Stress Images:  There is mild apical thinning but otherwise normal uptake in the remaining walls. Rest Images:  There is mild apical thinning but otherwise normal uptake in the remaining walls Subtraction (SDS):  No evidence of ischemia. Transient Ischemic Dilatation (Normal <1.22): 1.05  Lung/Heart Ratio (Normal <0.45):  0.24  Quantitative Gated Spect Images QGS EDV:  75 ml QGS ESV:  17 ml QGS cine images:  NL LV Function; NL Wall Motion QGS EF: 77%  Impression Exercise Capacity:  Lexiscan with no exercise. BP Response:  Normal blood pressure response. Clinical Symptoms:  No chest pain. ECG Impression:  No significant ST segment change suggestive of ischemia. Comparison with Prior Nuclear Study: No images to compare  Overall Impression:  Normal stress nuclear study.  No evidence of ischemia.  Normal LV function.   Elyn Aquas., MD, [facility]   03/04/11  Please report to patient that study is normal  Elyn Aquas.

## 2011-03-04 NOTE — Progress Notes (Signed)
Copy routed to DR.Nahser.Scarlette Ar

## 2011-06-20 LAB — URINALYSIS, ROUTINE W REFLEX MICROSCOPIC
Bilirubin Urine: NEGATIVE
Glucose, UA: NEGATIVE
Hgb urine dipstick: NEGATIVE
Ketones, ur: NEGATIVE
Nitrite: NEGATIVE
Protein, ur: NEGATIVE
Specific Gravity, Urine: 1.013
Urobilinogen, UA: 1
pH: 6.5

## 2011-06-20 LAB — URINE MICROSCOPIC-ADD ON

## 2012-04-29 ENCOUNTER — Encounter: Payer: Self-pay | Admitting: Family Medicine

## 2012-04-29 ENCOUNTER — Ambulatory Visit (INDEPENDENT_AMBULATORY_CARE_PROVIDER_SITE_OTHER): Payer: Self-pay | Admitting: Family Medicine

## 2012-04-29 VITALS — BP 157/92 | HR 88 | Temp 98.0°F | Ht 62.5 in | Wt 170.0 lb

## 2012-04-29 DIAGNOSIS — I10 Essential (primary) hypertension: Secondary | ICD-10-CM

## 2012-04-29 DIAGNOSIS — K047 Periapical abscess without sinus: Secondary | ICD-10-CM | POA: Insufficient documentation

## 2012-04-29 DIAGNOSIS — H20029 Recurrent acute iridocyclitis, unspecified eye: Secondary | ICD-10-CM

## 2012-04-29 DIAGNOSIS — R42 Dizziness and giddiness: Secondary | ICD-10-CM | POA: Insufficient documentation

## 2012-04-29 MED ORDER — AMLODIPINE BESYLATE 10 MG PO TABS: 10.0000 mg | ORAL_TABLET | Freq: Every day | ORAL | Status: DC

## 2012-04-29 MED ORDER — METOPROLOL TARTRATE 100 MG PO TABS: 50.0000 mg | ORAL_TABLET | Freq: Two times a day (BID) | ORAL | Status: DC

## 2012-04-29 MED ORDER — PENICILLIN V POTASSIUM 500 MG PO TABS: 500.0000 mg | ORAL_TABLET | Freq: Three times a day (TID) | ORAL | Status: DC

## 2012-04-29 MED ORDER — HYDROCHLOROTHIAZIDE 25 MG PO TABS: 25.0000 mg | ORAL_TABLET | Freq: Every day | ORAL | Status: DC

## 2012-04-29 NOTE — Assessment & Plan Note (Signed)
Work up negative to date for an autoimmune disease.  Will have patient apply for orange card and then refer to an opthamologist.

## 2012-04-29 NOTE — Assessment & Plan Note (Signed)
Poorly controlled.  She complains of cough from lisinopril, cannot afford losartan.  Will d/c lisinopril, increase both amlodipine and metoprolol, continue HCTZ at current dose.  When she gets the orange card, she may be able to get losartan from [facility].

## 2012-04-29 NOTE — Patient Instructions (Signed)
It was nice to meet you.  I have sent new prescriptions to your [facility] for your blood pressure medications.  I increased your amlodpine and your metoprolol.  I want you to stop taking the lisinopril since it makes you cough.  Please plan to make a follow up appointment with Dr. Ashley Royalty, your PCP in about one month.

## 2012-04-29 NOTE — Progress Notes (Signed)
  Subjective:    Patient ID: Sheila Fernandez, female    DOB: 13-Jan-1951, 61 y.o.   MRN: [id number]  HPI  Debie comes in with a few complaints.  She no longer has her insurance, and has not been in our office in more than a year, so had to come in so she can apply for the orange card.   She has Iritis, which her Opthalmologist, Dr. Nile Riggs, has told her is secondary from some other disease that her medical doctor should work up.  About one year ago, she had an autoimmune work up.  Her ESR was elevated, but her HLA B27, ANA, RF, and a BMET were all normal.    She also complains of intermittent dizziness.  She says it comes on all of a sudden, and describes it as light-headedness, not dizziness, and cannot think of anything that provokes it.  She has not had syncope or a fall from it, but she does have to sit down and rest for a minute until it goes away.  She has never checked her BP when it happened.  She does walk to work outside, but says she drinks a lot of water to stay hydrated.   HTN- is taking lisinopril, but thinks it gives her a cough.  She is also taking metoprolol 50 mg bid, amlodipine 5 mg daily, and hctz 25 mg daily.  She does not have chest pain or dyspnea.  Past Medical History  Diagnosis Date  . Adenomyosis   . Uterine fibroid   . Hypertension   . Iritis, recurrent   . Arthritis    Family History  Problem Relation Age of Onset  . Cancer Mother 5    Pancreatic  . Hypertension Mother   . Stroke Father   . Cancer Father 75    Prostate  . Hypertension Father   . Prostate cancer Father   . Cancer Sister 24    2 sisters d/c cancer less than 40yo  . Diabetes Sister   . Hypertension Sister   . Diabetes Daughter   . Early death Daughter   . Hypertension Daughter   . Kidney disease Daughter   . Diabetes Brother   . Hypertension Brother   . Prostate cancer Brother     History  Substance Use Topics  . Smoking status: Former Smoker    Quit date: 11/28/2003  .  Smokeless tobacco: Never Used  . Alcohol Use: No   Review of Systems See HPI    Objective:   Physical Exam BP 157/92  Pulse 88  Temp 98 F (36.7 C) (Oral)  Ht 5' 2.5" (1.588 m)  Wt 170 lb (77.111 kg)  BMI 30.60 kg/m2 General appearance: alert, cooperative and no distress Eyes: PERRL, +scleral icterus Lungs: clear to auscultation bilaterally Heart: regular rate and rhythm, S1, S2 normal, no murmur, click, rub or gallop Extremities: extremities normal, atraumatic, no cyanosis or edema      Assessment & Plan:

## 2012-04-29 NOTE — Assessment & Plan Note (Signed)
No red flags, concern for elevated BP vs mild dehydration.  Advised pt to try taking bp when she has an episode and to drink plenty of fluids.

## 2012-06-14 ENCOUNTER — Encounter: Payer: Self-pay | Admitting: Cardiology

## 2012-06-16 ENCOUNTER — Encounter: Payer: Self-pay | Admitting: Cardiology

## 2013-09-05 ENCOUNTER — Other Ambulatory Visit: Payer: Self-pay

## 2013-09-05 ENCOUNTER — Observation Stay (HOSPITAL_COMMUNITY)
Admission: EM | Admit: 2013-09-05 | Discharge: 2013-09-06 | Disposition: A | Discharge status: Home / Self Care | Payer: BC Managed Care – PPO | Attending: Family Medicine | Admitting: Family Medicine

## 2013-09-05 ENCOUNTER — Encounter (HOSPITAL_COMMUNITY): Payer: Self-pay | Admitting: Emergency Medicine

## 2013-09-05 ENCOUNTER — Emergency Department (HOSPITAL_COMMUNITY): Payer: BC Managed Care – PPO

## 2013-09-05 DIAGNOSIS — I10 Essential (primary) hypertension: Secondary | ICD-10-CM

## 2013-09-05 DIAGNOSIS — Z87891 Personal history of nicotine dependence: Secondary | ICD-10-CM

## 2013-09-05 DIAGNOSIS — N8000 Endometriosis of the uterus, unspecified: Secondary | ICD-10-CM | POA: Insufficient documentation

## 2013-09-05 DIAGNOSIS — D259 Leiomyoma of uterus, unspecified: Secondary | ICD-10-CM | POA: Insufficient documentation

## 2013-09-05 DIAGNOSIS — R079 Chest pain, unspecified: Principal | ICD-10-CM

## 2013-09-05 DIAGNOSIS — R110 Nausea: Secondary | ICD-10-CM

## 2013-09-05 DIAGNOSIS — R0602 Shortness of breath: Secondary | ICD-10-CM | POA: Insufficient documentation

## 2013-09-05 DIAGNOSIS — H20029 Recurrent acute iridocyclitis, unspecified eye: Secondary | ICD-10-CM | POA: Insufficient documentation

## 2013-09-05 DIAGNOSIS — M1990 Unspecified osteoarthritis, unspecified site: Secondary | ICD-10-CM

## 2013-09-05 DIAGNOSIS — R1110 Vomiting, unspecified: Secondary | ICD-10-CM | POA: Insufficient documentation

## 2013-09-05 DIAGNOSIS — M129 Arthropathy, unspecified: Secondary | ICD-10-CM

## 2013-09-05 LAB — COMPREHENSIVE METABOLIC PANEL
ALT: 22 U/L (ref 0–35)
AST: 34 U/L (ref 0–37)
Albumin: 3.8 g/dL (ref 3.5–5.2)
Alkaline Phosphatase: 101 U/L (ref 39–117)
BUN: 15 mg/dL (ref 6–23)
CO2: 24 mEq/L (ref 19–32)
Calcium: 9.6 mg/dL (ref 8.4–10.5)
Chloride: 104 mEq/L (ref 96–112)
Creatinine, Ser: 0.59 mg/dL (ref 0.50–1.10)
GFR calc Af Amer: >90 mL/min (ref >90)
GFR calc non Af Amer: >90 mL/min (ref >90)
Glucose, Bld: 132 mg/dL — ABNORMAL HIGH (ref 70–99)
Potassium: 3.8 mEq/L (ref 3.5–5.1)
Sodium: 138 mEq/L (ref 135–145)
Total Bilirubin: 0.4 mg/dL (ref 0.3–1.2)
Total Protein: 7.7 g/dL (ref 6.0–8.3)

## 2013-09-05 LAB — URINE MICROSCOPIC-ADD ON

## 2013-09-05 LAB — URINALYSIS, ROUTINE W REFLEX MICROSCOPIC
Bilirubin Urine: NEGATIVE
Glucose, UA: NEGATIVE mg/dL
Ketones, ur: NEGATIVE mg/dL
Nitrite: NEGATIVE
Protein, ur: NEGATIVE mg/dL
Specific Gravity, Urine: 1.008 (ref 1.005–1.030)
Urobilinogen, UA: 0.2 mg/dL (ref 0.0–1.0)
pH: 7.5 (ref 5.0–8.0)

## 2013-09-05 LAB — CBC
HCT: 33.3 % — ABNORMAL LOW (ref 36.0–46.0)
Hemoglobin: 10.8 g/dL — ABNORMAL LOW (ref 12.0–15.0)
MCH: 26.1 pg (ref 26.0–34.0)
MCHC: 32.4 g/dL (ref 30.0–36.0)
MCV: 80.4 fL (ref 78.0–100.0)
Platelets: 263 10*3/uL (ref 150–400)
RBC: 4.14 MIL/uL (ref 3.87–5.11)
RDW: 15.8 % — ABNORMAL HIGH (ref 11.5–15.5)
WBC: 7.1 10*3/uL (ref 4.0–10.5)

## 2013-09-05 LAB — POCT I-STAT TROPONIN I: Troponin i, poc: 0 ng/mL (ref 0.00–0.08)

## 2013-09-05 MED ORDER — MORPHINE SULFATE 4 MG/ML IJ SOLN
4.0000 mg | Freq: Once | INTRAMUSCULAR | Status: AC
  Administered 2013-09-05: 4 mg via INTRAVENOUS
  Filled 2013-09-05: qty 1

## 2013-09-05 MED ORDER — ASPIRIN 81 MG PO CHEW: 324.0000 mg | CHEWABLE_TABLET | Freq: Once | ORAL | Status: DC

## 2013-09-05 MED ORDER — ONDANSETRON HCL 4 MG/2ML IJ SOLN
4.0000 mg | Freq: Once | INTRAMUSCULAR | Status: AC
  Administered 2013-09-05: 4 mg via INTRAVENOUS
  Filled 2013-09-05: qty 2

## 2013-09-05 MED ORDER — NITROGLYCERIN 0.4 MG SL SUBL
0.4000 mg | SUBLINGUAL_TABLET | SUBLINGUAL | Status: AC | PRN
  Administered 2013-09-05 (×3): 0.4 mg via SUBLINGUAL
  Filled 2013-09-05: qty 25

## 2013-09-05 NOTE — ED Notes (Signed)
C/o mid and left sided chest pain x 1 hour, states pain feels like it is pulsating, also c/o SOB, nausea, no vomiting, states has not had this kind of pain before

## 2013-09-05 NOTE — ED Notes (Signed)
RN unable to receive report at this time will call back.

## 2013-09-06 ENCOUNTER — Encounter (HOSPITAL_COMMUNITY): Payer: Self-pay | Admitting: *Deleted

## 2013-09-06 DIAGNOSIS — R110 Nausea: Secondary | ICD-10-CM

## 2013-09-06 DIAGNOSIS — Z87891 Personal history of nicotine dependence: Secondary | ICD-10-CM

## 2013-09-06 LAB — BASIC METABOLIC PANEL
BUN: 15 mg/dL (ref 6–23)
CO2: 24 mEq/L (ref 19–32)
Calcium: 8.8 mg/dL (ref 8.4–10.5)
Chloride: 103 mEq/L (ref 96–112)
Creatinine, Ser: 0.65 mg/dL (ref 0.50–1.10)
GFR calc Af Amer: >90 mL/min (ref >90)
GFR calc non Af Amer: >90 mL/min (ref >90)
Glucose, Bld: 142 mg/dL — ABNORMAL HIGH (ref 70–99)
Potassium: 3.9 mEq/L (ref 3.5–5.1)
Sodium: 138 mEq/L (ref 135–145)

## 2013-09-06 LAB — LIPID PANEL
Cholesterol: 149 mg/dL (ref 0–200)
HDL: 50 mg/dL (ref >39)
LDL Cholesterol: 77 mg/dL (ref 0–99)
Total CHOL/HDL Ratio: 3 RATIO
Triglycerides: 108 mg/dL (ref <150)
VLDL: 22 mg/dL (ref 0–40)

## 2013-09-06 LAB — CBC
HCT: 32.9 % — ABNORMAL LOW (ref 36.0–46.0)
Hemoglobin: 11 g/dL — ABNORMAL LOW (ref 12.0–15.0)
MCH: 26.6 pg (ref 26.0–34.0)
MCHC: 33.4 g/dL (ref 30.0–36.0)
MCV: 79.7 fL (ref 78.0–100.0)
Platelets: 267 10*3/uL (ref 150–400)
RBC: 4.13 MIL/uL (ref 3.87–5.11)
RDW: 15.7 % — ABNORMAL HIGH (ref 11.5–15.5)
WBC: 11.5 10*3/uL — ABNORMAL HIGH (ref 4.0–10.5)

## 2013-09-06 LAB — TROPONIN I
Troponin I: <0.3 ng/mL (ref <0.30)
Troponin I: <0.3 ng/mL (ref <0.30)
Troponin I: <0.3 ng/mL (ref <0.30)

## 2013-09-06 LAB — HEMOGLOBIN A1C
Hgb A1c MFr Bld: 5.5 % (ref <5.7)
Mean Plasma Glucose: 111 mg/dL (ref <117)

## 2013-09-06 LAB — TSH: TSH: 1.228 u[IU]/mL (ref 0.350–4.500)

## 2013-09-06 MED ORDER — AMLODIPINE BESYLATE 10 MG PO TABS: 10.0000 mg | ORAL_TABLET | Freq: Every day | ORAL | Status: DC

## 2013-09-06 MED ORDER — HYDROCHLOROTHIAZIDE 25 MG PO TABS
25.0000 mg | ORAL_TABLET | Freq: Every day | ORAL | Status: DC
  Administered 2013-09-06: 25 mg via ORAL
  Filled 2013-09-06: qty 1

## 2013-09-06 MED ORDER — GI COCKTAIL ~~LOC~~: 30.0000 mL | Freq: Four times a day (QID) | ORAL | Status: DC | PRN

## 2013-09-06 MED ORDER — POTASSIUM CHLORIDE IN NACL 20-0.9 MEQ/L-% IV SOLN
INTRAVENOUS | Status: DC
  Administered 2013-09-06: 02:00:00 via INTRAVENOUS
  Filled 2013-09-06 (×2): qty 1000

## 2013-09-06 MED ORDER — ONDANSETRON HCL 4 MG/2ML IJ SOLN: 4.0000 mg | Freq: Four times a day (QID) | INTRAMUSCULAR | Status: DC | PRN

## 2013-09-06 MED ORDER — HYDROCHLOROTHIAZIDE 25 MG PO TABS: 25.0000 mg | ORAL_TABLET | Freq: Every day | ORAL | Status: DC

## 2013-09-06 MED ORDER — ENOXAPARIN SODIUM 40 MG/0.4ML ~~LOC~~ SOLN
40.0000 mg | SUBCUTANEOUS | Status: DC
  Administered 2013-09-06: 40 mg via SUBCUTANEOUS
  Filled 2013-09-06: qty 0.4

## 2013-09-06 MED ORDER — AMLODIPINE BESYLATE 10 MG PO TABS
10.0000 mg | ORAL_TABLET | Freq: Every day | ORAL | Status: DC
  Administered 2013-09-06: 10 mg via ORAL
  Filled 2013-09-06: qty 1

## 2013-09-06 MED ORDER — MORPHINE SULFATE 2 MG/ML IJ SOLN: 2.0000 mg | INTRAMUSCULAR | Status: DC | PRN

## 2013-09-06 MED ORDER — NITROGLYCERIN 0.4 MG SL SUBL: 0.4000 mg | SUBLINGUAL_TABLET | SUBLINGUAL | Status: DC | PRN

## 2013-09-06 MED ORDER — ACETAMINOPHEN 325 MG PO TABS
650.0000 mg | ORAL_TABLET | ORAL | Status: DC | PRN
Start: 2013-09-06 — End: 2013-09-06

## 2013-09-06 MED ORDER — INFLUENZA VAC SPLIT QUAD 0.5 ML IM SUSP: 0.5000 mL | INTRAMUSCULAR | Status: DC

## 2013-09-06 MED ORDER — ADULT MULTIVITAMIN W/MINERALS CH
1.0000 | ORAL_TABLET | Freq: Every day | ORAL | Status: DC
  Administered 2013-09-06: 1 via ORAL
  Filled 2013-09-06: qty 1

## 2013-09-06 MED ORDER — ASPIRIN EC 325 MG PO TBEC
325.0000 mg | DELAYED_RELEASE_TABLET | Freq: Every day | ORAL | Status: DC
  Administered 2013-09-06: 325 mg via ORAL
  Filled 2013-09-06: qty 1

## 2013-09-06 MED ORDER — METOPROLOL TARTRATE 100 MG PO TABS: 50.0000 mg | ORAL_TABLET | Freq: Two times a day (BID) | ORAL | Status: DC

## 2013-09-06 MED ORDER — METOPROLOL TARTRATE 50 MG PO TABS
50.0000 mg | ORAL_TABLET | Freq: Two times a day (BID) | ORAL | Status: DC
  Administered 2013-09-06 (×2): 50 mg via ORAL
  Filled 2013-09-06 (×3): qty 1

## 2013-09-06 NOTE — H&P (Signed)
Family Medicine Teaching [facility] Admission History and Physical Service Pager: [phone]  Patient name: Sheila Fernandez Medical record number: [id number] Date of birth: August 13, 1951 Age: 62 y.o. Gender: female  Primary Care Provider: Levert Feinstein, MD Consultants: None Code Status: Full  Chief Complaint: Chest Pain  Assessment and Plan: Sheila Fernandez is a 62 y.o. female presenting for chest pain rule out . PMH is significant for HTN, chronic iritis and prior chronic chest pain.  # Chest Pain - Previously followed by Dr. Melburn Popper for intermittent chest pain. Last office note was in 2012 which recommended Myoview, however, this was never performed. Patient states she had an exercise stress test at [facility] cardiology "a few years ago" that showed "heart muscle damage." (I am not able to locate this information in her medical record.) Currently her HEART score is 3 (mildly suspicious story, age of 58 and 1 risk factor of HTN) - Chest pain obs, attending Dr. Randolm Idol - EKG now since ED EKG is not available - Telemetry - Troponin x3 (POC troponin negative) - Risk stratification labs (TSH, A1c, Lipid panel) - ASA daily - Nitro and Morphine prn pain - Zofran prn nausea - Consider cards consult vs. Outpatient follow up for further evaluation  # HTN - Known history of HTN. She has not been seen in over one year, and I am not sure how consistently she is taking her outpatient medications - Con't HCTZ, Amlodipine and Metoprolol.  - Vitals per floor protocol  FEN/GI: heart healthy diet for now, IVF at 50 cc/hr Prophylaxis: Lovenox, ASA  Disposition: Observation for chest pain rule out. Dispo pending work up and clinical improvement  History of Present Illness: Sheila Fernandez is a 62 y.o. female with PMH of HTN, iritis and prior chest pain work up who presented to [facility] ED with 1 hour of substernal chest pain. She states her CP started acutely around 1715 this  evening. She does not remember doing anything to cause the pain. Pain did not radiate initially, but she had one episode of left shoulder pain in the ED. Pain was not exacerbated by movement. She did not try any medication at home to help. She reports nausea and headache, but no diaphoresis or pre-syncope symptoms.  She was treated with Nitro x3 in the ED with some relief, but given her history of prior chest pain work up she was transferred to [facility] for chest pain observation. Her initial troponin was negative, and her CXR negative.  Per EDP report, EKG is unchanged from prior tracing however this is not available for review at time of her admission to [facility].    Patient has had chronic intermittent chest pain with typical and atypical characteristics in the past. She was last evaluated by Dr. Melburn Popper in 2012 and at that time he recommended Myoview, but this was never completed. She has not been seen by our clinic in more than one year. She states she is still taking antihypertensive medications regularly. She denies history of DM, HLD. She does not smoke (but is a former smoker). She denies any strong family history of cardiac problems.  Review Of Systems: Per HPI with the following additions: reports a viral URI a few weeks ago and increased frequency of headaches. Otherwise 12 point review of systems was performed and was unremarkable.  Patient Active Problem List   Diagnosis Date Noted  . Chest pain 09/05/2013  . Dizziness - light-headed 04/29/2012  . Arthritis   . Chest  pain 02/03/2011  . Iritis, recurrent 01/16/2011  . PLANTAR FASCIITIS, BILATERAL 08/15/2010  . COUGH, CHRONIC 08/15/2010  . FIBROIDS, UTERUS 12/21/2009  . ASCUS PAP 10/05/2009  . CERVICAL RADICULOPATHY 07/21/2007  . SICKLE CELL TRAIT 11/26/2006  . HYPERTENSION, BENIGN SYSTEMIC 11/26/2006   Past Medical History: Past Medical History  Diagnosis Date  . Adenomyosis   . Uterine fibroid   . Hypertension   .  Iritis, recurrent   . Arthritis    Past Surgical History: Past Surgical History  Procedure Laterality Date  . Cesarean section    . Cholecystectomy    . Breast lumpectomy    . Foot surgery     Social History: History  Substance Use Topics  . Smoking status: Former Smoker    Quit date: 11/28/2003  . Smokeless tobacco: Never Used  . Alcohol Use: No   Additional social history: Does not smoke  Please also refer to relevant sections of EMR.  Family History: Family History  Problem Relation Age of Onset  . Cancer Mother 17    Pancreatic  . Hypertension Mother   . Stroke Father   . Cancer Father 67    Prostate  . Hypertension Father   . Prostate cancer Father   . Cancer Sister 22    2 sisters d/c cancer less than 40yo  . Diabetes Sister   . Hypertension Sister   . Diabetes Daughter   . Early death Daughter   . Hypertension Daughter   . Kidney disease Daughter   . Diabetes Brother   . Hypertension Brother   . Prostate cancer Brother    Allergies and Medications: Allergies  Allergen Reactions  . Codeine     nasueous   No current facility-administered medications on file prior to encounter.   Current Outpatient Prescriptions on File Prior to Encounter  Medication Sig Dispense Refill  . amLODipine (NORVASC) 10 MG tablet Take 1 tablet (10 mg total) by mouth daily.  30 tablet  11  . metoprolol (LOPRESSOR) 100 MG tablet Take 0.5 tablets (50 mg total) by mouth 2 (two) times daily.  60 tablet  11  . hydrochlorothiazide (HYDRODIURIL) 25 MG tablet Take 1 tablet (25 mg total) by mouth daily.  90 tablet  3  . nitroGLYCERIN (NITROSTAT) 0.4 MG SL tablet Place 1 tablet (0.4 mg total) under the tongue every 5 (five) minutes as needed. Call doctor if used  90 tablet  1    Objective: BP 152/82  Pulse 62  Temp(Src) 98.3 F (36.8 C) (Oral)  Resp 16  Ht 5' 2.5" (1.588 m)  Wt 170 lb 8 oz (77.338 kg)  BMI 30.67 kg/m2  SpO2 94% Exam: General: In bed on right side, appears  comfortable in NAD. Daughter at bedside. HEENT: AT, . MMM. Cardiovascular: RRR, good distal pulses. No TTP of chest Respiratory: Good effort, no respiratory distress. Lungs CTAB Abdomen: Soft, nontender, nondistended Extremities: Moves all extremities. No edema noted Skin: Warm and dry Neuro: Grossly intact without focal deficits   Labs and Imaging: CBC BMET   Recent Labs Lab 09/05/13 1859  WBC 7.1  HGB 10.8*  HCT 33.3*  PLT 263    Recent Labs Lab 09/05/13 1859  NA 138  K 3.8  CL 104  CO2 24  BUN 15  CREATININE 0.59  GLUCOSE 132*  CALCIUM 9.6    Dg Chest Portable 1 View  09/05/2013   CLINICAL DATA:  Chest pain.  Short of breath.  EXAM: PORTABLE CHEST - 1 VIEW  COMPARISON:  11/02/2007.  FINDINGS: Cardiopericardial silhouette within normal limits. Mediastinal contours normal. Trachea midline. No airspace disease or effusion. Monitoring leads project over the chest.  IMPRESSION: No active disease.   Electronically Signed   By: Andreas Newport M.D.   On: 09/05/2013 19:00   Hilarie Fredrickson, MD 09/06/2013, 1:06 AM PGY-3, [facility] FPTS Intern pager: [phone], text pages welcome

## 2013-09-06 NOTE — Discharge Summary (Signed)
Family Medicine Teaching [facility] Discharge Summary  Patient name: Sheila Fernandez Medical record number: [id number] Date of birth: [birth year] Age: 62 y.o. Gender: female Date of Admission: 09/05/2013  Date of Discharge: 09/06/13 Admitting Physician: Leighton Roach McDiarmid, MD  Primary Care Provider: Levert Feinstein, MD Consultants: None  Indication for Hospitalization: chest pain, r/o MI  Discharge Diagnoses/Problem List:  -Chest pain -HTN -Arthritis -plantar fasciitis  Disposition: home  Discharge Condition: improved  Discharge Exam:  BP 133/99  Pulse 55  Temp(Src) 97.4 F (36.3 C) (Oral)  Resp 18  Ht 5' 2.5" (1.588 m)  Wt 170 lb 8 oz (77.338 kg)  BMI 30.67 kg/m2  SpO2 97% General: lying appears comfortable in NAD. Daughter at bedside.  HEENT: ncat, MMM.  Cardiovascular: RRR, good distal pulses. No TTP of chest  Respiratory: Good effort, no respiratory distress. Lungs CTAB  Abdomen: Soft, nontender, nondistended  Extremities: Moves all extremities. No edema noted  Skin: Warm and dry  Neuro: Grossly intact without focal deficits   Brief Hospital Course:  # Chest Pain - Previously followed by Dr. Melburn Popper for intermittent chest pain. Last office note was in 2012 which recommended Myoview, however, this was never performed. Patient states she had an exercise stress test at [facility] cardiology "a few years ago" that showed "heart muscle damage." Believe this is in reference to stress test in 2011 which was neg for ischemia. Pt presented to [facility] with 1hr hx of substernal CP, without precipitation, no radiation. S/p nitro X3 in the ED with moderate relief. Given hx of prior cp, pt was transferred to [facility] for further obs. Initial trop neg and CXR neg for infiltrate. On admission trops cycled, neg X3, EKG unchanged. Risk stratified TSH 1.229, A1c 5.5, lipid profile 129/108/50/77. Continued on ASA, received X1 morphine with complete relief of pain. Stated that CP actually  reproducible on pressing down hard on sternum. 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 May also be a component of HTN urgency as BPs 219/114 on admission. Pt hemodynamically stable on d/c without c/o CP. To f/up closely with cardiology. Appointment was made with Dr. Harvie Bridge office on 12/22.   # HTN - Known history of HTN. Despite pt attesting to having been compliant with medications it is unclear how she has been consistently taking meds as she has not been seen in over one year. Pt initially with BP of 219/114. Placed back on her "home" medications which included HCTZ, amlodipine and metoprolol with great improvement. BP 130s/90s on time of d/c. Will need to f/up closely as outpt for further medication titration.   Issues for Follow Up:  1. BP regimen and compliance  Significant Procedures: None  Significant Labs and Imaging:   Recent Labs Lab 09/05/13 1859 09/06/13 0225  WBC 7.1 11.5*  HGB 10.8* 11.0*  HCT 33.3* 32.9*  PLT 263 267    Recent Labs Lab 09/05/13 1859 09/06/13 0225  NA 138 138  K 3.8 3.9  CL 104 103  CO2 24 24  GLUCOSE 132* 142*  BUN 15 15  CREATININE 0.59 0.65  CALCIUM 9.6 8.8  ALKPHOS 101  --   AST 34  --   ALT 22  --   ALBUMIN 3.8  --     CXR- no active CP process EKG- no ischemic changes   Results/Tests Pending at Time of Discharge: none Discharge Medications:    Medication List         amLODipine 10 MG tablet  Commonly known as:  NORVASC  Take 1 tablet (10 mg total) by mouth daily.     aspirin 325 MG tablet  Take 650 mg by mouth once.     hydrochlorothiazide 25 MG tablet  Commonly known as:  HYDRODIURIL  Take 1 tablet (25 mg total) by mouth daily.     metoprolol 100 MG tablet  Commonly known as:  LOPRESSOR  Take 0.5 tablets (50 mg total) by mouth 2 (two) times daily.     multivitamin with minerals Tabs tablet  Take 1 tablet by mouth daily.     nitroGLYCERIN 0.4 MG SL tablet  Commonly known as:  NITROSTAT  Place 1 tablet (0.4  mg total) under the tongue every 5 (five) minutes as needed. Call doctor if single tab used        Discharge Instructions: Please refer to Patient Instructions section of EMR for full details.  Patient was counseled important signs and symptoms that should prompt return to medical care, changes in medications, dietary instructions, activity restrictions, and follow up appointments.   Follow-Up Appointments: Follow-up Information   Follow up with Levert Feinstein, MD On 09/14/2013. (at 2:30)    Specialty:  Family Medicine   Contact information:   [address] [place] [address] [phone]       Follow up with Elyn Aquas., MD On 09/19/2013. (at 2pm)    Specialty:  Cardiology   Contact information:   [address] [place] [address] [phone]       Anselm Lis, MD 09/07/2013, 12:36 PM PGY-1, [facility]

## 2013-09-06 NOTE — H&P (Signed)
FMTS Attending Note  I personally saw and evaluated the patient. The plan of care was discussed with the resident team. I agree with the assessment and plan as documented by the resident.   62 y/o female with PMH HTN, iritis, and intermittent chest pain presents for evaluation of substernal chest pain that occurred yesterday, no radiation, did have some associated left arm tingling, mild nausea, no associated sob, relieved with morphine, asa, and nitroglycerin. Please see resident HPI. Currently does not have chest pain, mild nausea, no sob, frontal headaches, no abdominal pain  Vital: reviewed Gen: pleasant AAA, NAD HEENT: normocephalic, PERRL, EOMI, MMM, neck supple Cardiac: RRR, S1 and S2 present, no murmurs, no heaves/thrills Abd: soft, no tenderness Resp: CTAB, normal effort Ext: no edema, 2+ radial pulses  EKG: no acute ischemic changes Stress test 2011 negative for ischemia  1. Chest pain - cardiac workup negative, patient currently asymptomatic, agree with risk stratification, patient will require outpatient cardiac evaluation and possible repeat stress test 2. HTN - currently controlled, continue HCTZ, Amlodipine, and Metoprolol 3. Headache - likely 2/2 nitroglycerin, tylenol as needed  Donnella Sham MD

## 2013-09-06 NOTE — Progress Notes (Signed)
DC orders received.  Patient stable with no S/S of distress.  Medication and discharge information reviewed with patient and patient's family.  Patient DC home with family. Sheila Fernandez  

## 2013-09-07 NOTE — Discharge Summary (Signed)
I discussed with  Dr Marsh.  I agree with their plans documented in their discharge note.  

## 2013-09-08 ENCOUNTER — Encounter: Payer: Self-pay | Admitting: Cardiology

## 2013-09-09 NOTE — ED Provider Notes (Signed)
CSN: [id number]     Arrival date & time 09/05/13  1808 History   First MD Initiated Contact with Patient 09/05/13 1823     Chief Complaint  Patient presents with  . Chest Pain  . Shortness of Breath   (Consider location/radiation/quality/duration/timing/severity/associated sxs/prior Treatment) HPI Comments: Patient presents to the ER for evaluation of chest pain. Pain began approximately one hour prior to arrival to the ER. She is shortness of breath associated with the symptoms with some nausea. There was some diaphoresis associated with the symptoms, prompting her to come to the ER. Patient took aspirin prior to arrival. She is still experiencing discomfort, though it is improved prior to arrival.  Patient reports that she has seen by cardiology in the past and had a stress test which showed heart muscle damage. She has never, however, been told she has had a heart attack.  Patient is a 62 y.o. female presenting with chest pain and shortness of breath.  Chest Pain Associated symptoms: shortness of breath   Shortness of Breath Associated symptoms: chest pain     Past Medical History  Diagnosis Date  . Adenomyosis   . Uterine fibroid   . Hypertension   . Iritis, recurrent   . Arthritis    Past Surgical History  Procedure Laterality Date  . Cesarean section    . Cholecystectomy    . Breast lumpectomy    . Foot surgery     Family History  Problem Relation Age of Onset  . Cancer Mother 76    Pancreatic  . Hypertension Mother   . Stroke Father   . Cancer Father 36    Prostate  . Hypertension Father   . Prostate cancer Father   . Cancer Sister 19    2 sisters d/c cancer less than 40yo  . Diabetes Sister   . Hypertension Sister   . Diabetes Daughter   . Early death Daughter   . Hypertension Daughter   . Kidney disease Daughter   . Diabetes Brother   . Hypertension Brother   . Prostate cancer Brother    History  Substance Use Topics  . Smoking status: Former Smoker     Quit date: 11/28/2003  . Smokeless tobacco: Never Used  . Alcohol Use: No   OB History   Grav Para Term Preterm Abortions TAB SAB Ect Mult Living                 Review of Systems  Respiratory: Positive for shortness of breath.   Cardiovascular: Positive for chest pain.  All other systems reviewed and are negative.    Allergies  Codeine  Home Medications   Current Outpatient Rx  Name  Route  Sig  Dispense  Refill  . aspirin 325 MG tablet   Oral   Take 650 mg by mouth once.         . Multiple Vitamin (MULTIVITAMIN WITH MINERALS) TABS tablet   Oral   Take 1 tablet by mouth daily.         [name] amLODipine (NORVASC) 10 MG tablet   Oral   Take 1 tablet (10 mg total) by mouth daily.   30 tablet   3   . hydrochlorothiazide (HYDRODIURIL) 25 MG tablet   Oral   Take 1 tablet (25 mg total) by mouth daily.   90 tablet   3   . metoprolol (LOPRESSOR) 100 MG tablet   Oral   Take 0.5 tablets (50 mg total) by mouth  2 (two) times daily.   60 tablet   3   . nitroGLYCERIN (NITROSTAT) 0.4 MG SL tablet   Sublingual   Place 1 tablet (0.4 mg total) under the tongue every 5 (five) minutes as needed. Call doctor if single tab used   90 tablet   1    BP 133/99  Pulse 55  Temp(Src) 97.4 F (36.3 C) (Oral)  Resp 18  Ht 5' 2.5" (1.588 m)  Wt 170 lb 8 oz (77.338 kg)  BMI 30.67 kg/m2  SpO2 97% Physical Exam  Constitutional: She is oriented to person, place, and time. She appears well-developed and well-nourished. No distress.  HENT:  Head: Normocephalic and atraumatic.  Right Ear: Hearing normal.  Left Ear: Hearing normal.  Nose: Nose normal.  Mouth/Throat: Oropharynx is clear and moist and mucous membranes are normal.  Eyes: Conjunctivae and EOM are normal. Pupils are equal, round, and reactive to light.  Neck: Normal range of motion. Neck supple.  Cardiovascular: Regular rhythm, S1 normal and S2 normal.  Exam reveals no gallop and no friction rub.   No murmur  heard. Pulmonary/Chest: Effort normal and breath sounds normal. No respiratory distress. She exhibits no tenderness.  Abdominal: Soft. Normal appearance and bowel sounds are normal. There is no hepatosplenomegaly. There is no tenderness. There is no rebound, no guarding, no tenderness at McBurney's point and negative Murphy's sign. No hernia.  Musculoskeletal: Normal range of motion.  Neurological: She is alert and oriented to person, place, and time. She has normal strength. No cranial nerve deficit or sensory deficit. Coordination normal. GCS eye subscore is 4. GCS verbal subscore is 5. GCS motor subscore is 6.  Skin: Skin is warm, dry and intact. No rash noted. No cyanosis.  Psychiatric: She has a normal mood and affect. Her speech is normal and behavior is normal. Thought content normal.    ED Course  Procedures (including critical care time) Labs Review Labs Reviewed  CBC - Abnormal; Notable for the following:    Hemoglobin 10.8 (*)    HCT 33.3 (*)    RDW 15.8 (*)    All other components within normal limits  COMPREHENSIVE METABOLIC PANEL - Abnormal; Notable for the following:    Glucose, Bld 132 (*)    All other components within normal limits  URINALYSIS, ROUTINE W REFLEX MICROSCOPIC - Abnormal; Notable for the following:    Hgb urine dipstick TRACE (*)    Leukocytes, UA LARGE (*)    All other components within normal limits  CBC - Abnormal; Notable for the following:    WBC 11.5 (*)    Hemoglobin 11.0 (*)    HCT 32.9 (*)    RDW 15.7 (*)    All other components within normal limits  BASIC METABOLIC PANEL - Abnormal; Notable for the following:    Glucose, Bld 142 (*)    All other components within normal limits  URINE MICROSCOPIC-ADD ON  TROPONIN I  TROPONIN I  TROPONIN I  LIPID PANEL  HEMOGLOBIN A1C  TSH  POCT I-STAT TROPONIN I   Imaging Review No results found.  EKG Interpretation    Date/Time:    Ventricular Rate:    PR Interval:    QRS Duration:   QT  Interval:    QTC Calculation:   R Axis:     Text Interpretation:              MDM   1. Chest pain   2. Arthritis   3. Essential  hypertension, benign   4. History of smoking   5. Nausea alone   6. HYPERTENSION, BENIGN SYSTEMIC    Presents to the ER for evaluation of chest pain. Reviewing the patient's records reveals that she did have an evaluation with [facility] cardiology in 2012 and was recommended to have a Myoview. This never occurred. Patient's chest pain today is a mixed picture of atypical and typical features. She is very hypertensive arrival. Her initial evaluation was negative. She will be hospitalized for further evaluation of chest pain.    Gilda Crease, MD 09/09/13 [phone]

## 2013-09-14 ENCOUNTER — Encounter: Payer: Self-pay | Admitting: Family Medicine

## 2013-09-14 ENCOUNTER — Ambulatory Visit (INDEPENDENT_AMBULATORY_CARE_PROVIDER_SITE_OTHER): Payer: BC Managed Care – PPO | Admitting: Family Medicine

## 2013-09-14 VITALS — BP 125/81 | HR 68 | Temp 98.1°F | Ht 62.5 in | Wt 174.0 lb

## 2013-09-14 DIAGNOSIS — R079 Chest pain, unspecified: Secondary | ICD-10-CM

## 2013-09-14 DIAGNOSIS — I10 Essential (primary) hypertension: Secondary | ICD-10-CM

## 2013-09-14 DIAGNOSIS — H20029 Recurrent acute iridocyclitis, unspecified eye: Secondary | ICD-10-CM

## 2013-09-14 NOTE — Patient Instructions (Addendum)
It was nice to meet you today!  Please call your eye doctor to set up an appointment to talk about what happened with your eye 1-2 months ago. If you have an episode where you cannot see, go to the emergency room.  Your blood pressure is good today. I recommend Debrox for your ear wax. Follow the instructions on the box.  For your headache, I recommend ibuprofen 400mg , up to once daily. If you are still needing it after one week please return to be re-evaluated.  See the cardiologist on Monday. Follow up with me in 1-2 months or sooner if you have any problems.  Be well, Dr. Pollie Meyer

## 2013-09-14 NOTE — Progress Notes (Signed)
Patient ID: Sheila Fernandez, female   DOB: Sep 12, 1951, 62 y.o.   MRN: [id number]  HPI:  Hospitalization f/u: patient was hospitalized recently for chest pain rule out. States that she had some continued chest soreness the first few days after discharge, but hasn't had any recently. She does get somewhat short of breath with exertion if she exerts herself a lot or walks a long way. Denies having any lower extremity edema. She has an appointment with cardiology in just a few days. She is not taking any aspirin because it upsets her stomach.   Headache: Has had a headache on and off for the past 3 weeks. It hurts over her entire head. She denies having any numbness, tingling, or weakness in her body. Has taken ibuprofen intermittently for the headache and it has helped. She has a history of iritis for several years, for which she has been seen in the past by her ophthalmologist, and who has told her repeatedly that the iritis is secondary to "something else" going on. Last time she was seen by the eye doctor was earlier this year during the summer and they asked her to return for a visit whenever the iritis flared. She did have an episode where she could not see out of her right eye for about 5 minutes but did not seek care afterwards. This happened one to two months ago and has not recurred since.  ROS: See HPI  PMFSH: hx HTN, recurrent iritis  PHYSICAL EXAM: BP 125/81  Pulse 68  Temp(Src) 98.1 F (36.7 C) (Oral)  Ht 5' 2.5" (1.588 m)  Wt 174 lb (78.926 kg)  BMI 31.30 kg/m2 Visual acuity: 20/20 bilaterally with correction Gen: NAD HEENT: NCAT, visualized portion of retina appears normal on funduscopic exam, PERRL, no perilimbal erythema noted. Bilateral TM's obscured by cerumen impaction. Heart: RRR Lungs: CTAB Neuro: grossly nonfocal, speech intact, face symmetric, tongue protrudes midline, grossly normal strength Ext: No appreciable lower extremity edema bilaterally    ASSESSMENT/PLAN:  # Cerumen impaction: recommended OTC debrox  # Headache: neuro exam nonfocal today. Unclear etiology but as it has responded to ibuprofen and she reports it is over her "entire head" suspect tension headache component. Recommended she continue with ibuprofen up to once daily. If still requiring this after one week she will call to schedule another appointment to be re-evaluated.  See problem based charting for additional assessment/plan.   FOLLOW UP: F/u in 1-2 months for routine medical problems  [place] J. Pollie Meyer, MD [facility]

## 2013-09-19 ENCOUNTER — Ambulatory Visit (INDEPENDENT_AMBULATORY_CARE_PROVIDER_SITE_OTHER): Payer: BC Managed Care – PPO | Admitting: Nurse Practitioner

## 2013-09-19 ENCOUNTER — Encounter: Payer: Self-pay | Admitting: Nurse Practitioner

## 2013-09-19 VITALS — BP 136/80 | HR 70 | Ht 62.5 in | Wt 175.1 lb

## 2013-09-19 DIAGNOSIS — I10 Essential (primary) hypertension: Secondary | ICD-10-CM

## 2013-09-19 DIAGNOSIS — R079 Chest pain, unspecified: Secondary | ICD-10-CM

## 2013-09-19 MED ORDER — ASPIRIN 81 MG PO CHEW: 81.0000 mg | CHEWABLE_TABLET | Freq: Once | ORAL | Status: DC

## 2013-09-19 NOTE — Progress Notes (Signed)
Sheila Fernandez Date of Birth: May 14, 1951 Medical Record #[id number]  History of Present Illness: Sheila Fernandez is seen back today for a post ER visit. Seen for Dr. Elease Hashimoto. Has HTN, OA with past tobacco abuse. Negative Myoview with Dr. Deborah Chalk in January of 2011.   Last seen here in May of 2012 with atypical chest pain. Was to have had a Myoview - never followed up.  In the ER earlier this month - complaining of atypical chest pain. Negative evaluation. EKG was normal. Negative Troponin. Compliance was noted to be an issue.   Comes in here today. Here alone. Doing ok. Notes that the pain she had was midsternal, hurt to touch or mash on her chest - has not returned. BP was up - she is not sure why. Typically running below 135/85 at home. Not a bid salt user. Says she is taking her medicines. She is not sure why she is taking 650 mg of aspirin and notes that this hurts her stomach. Has been chronically anemic.   Current Outpatient Prescriptions  Medication Sig Dispense Refill  . amLODipine (NORVASC) 10 MG tablet Take 1 tablet (10 mg total) by mouth daily.  30 tablet  3  . aspirin 325 MG tablet Take 650 mg by mouth once.      . hydrochlorothiazide (HYDRODIURIL) 25 MG tablet Take 1 tablet (25 mg total) by mouth daily.  90 tablet  3  . metoprolol (LOPRESSOR) 100 MG tablet Take 0.5 tablets (50 mg total) by mouth 2 (two) times daily.  60 tablet  3  . Multiple Vitamin (MULTIVITAMIN WITH MINERALS) TABS tablet Take 1 tablet by mouth daily.      . nitroGLYCERIN (NITROSTAT) 0.4 MG SL tablet Place 1 tablet (0.4 mg total) under the tongue every 5 (five) minutes as needed. Call doctor if single tab used  90 tablet  1   No current facility-administered medications for this visit.    Allergies  Allergen Reactions  . Codeine     nasueous    Past Medical History  Diagnosis Date  . Adenomyosis   . Uterine fibroid   . Hypertension   . Iritis, recurrent   . Arthritis     Past Surgical History    Procedure Laterality Date  . Cesarean section    . Cholecystectomy    . Breast lumpectomy    . Foot surgery      History  Smoking status  . Former Smoker  . Quit date: 11/28/2003  Smokeless tobacco  . Never Used    History  Alcohol Use No    Family History  Problem Relation Age of Onset  . Cancer Mother 65    Pancreatic  . Hypertension Mother   . Stroke Father   . Cancer Father 12    Prostate  . Hypertension Father   . Prostate cancer Father   . Cancer Sister 36    2 sisters d/c cancer less than 40yo  . Diabetes Sister   . Hypertension Sister   . Diabetes Daughter   . Early death Daughter   . Hypertension Daughter   . Kidney disease Daughter   . Diabetes Brother   . Hypertension Brother   . Prostate cancer Brother     Review of Systems: The review of systems is per the HPI.  All other systems were reviewed and are negative.  Physical Exam: BP 136/80  Pulse 70  Ht 5' 2.5" (1.588 m)  Wt 175 lb 1.9 oz (79.434 kg)  BMI 31.50 kg/m2  SpO2 99% Patient is very pleasant and in no acute distress. Skin is warm and dry. Color is normal.  HEENT is unremarkable. Normocephalic/atraumatic. PERRL. Sclera are nonicteric. Neck is supple. No masses. No JVD. Lungs are clear. Cardiac exam shows a regular rate and rhythm. Abdomen is soft. Extremities are without edema. Gait and ROM are intact. No gross neurologic deficits noted.  LABORATORY DATA:  Lab Results  Component Value Date   WBC 11.5* 09/06/2013   HGB 11.0* 09/06/2013   HCT 32.9* 09/06/2013   PLT 267 09/06/2013   GLUCOSE 142* 09/06/2013   CHOL 149 09/06/2013   TRIG 108 09/06/2013   HDL 50 09/06/2013   LDLDIRECT 60 08/15/2010   LDLCALC 77 09/06/2013   ALT 22 09/05/2013   AST 34 09/05/2013   NA 138 09/06/2013   K 3.9 09/06/2013   CL 103 09/06/2013   CREATININE 0.65 09/06/2013   BUN 15 09/06/2013   CO2 24 09/06/2013   TSH 1.228 09/06/2013   HGBA1C 5.5 09/06/2013   Lab Results  Component Value Date   TROPONINI <0.30  09/06/2013    Assessment / Plan: 1. Atypical chest pain - resolved - sounds like this was more costochrondritis - I do not think further testing is needed at this time.   2. HTN - BP is currently ok - I can't explain why it was elevated at her OV - ?salt related, missed medicine, etc. I would leave her on her current regimen for now.   We will see back prn.   Patient is agreeable to this plan and will call if any problems develop in the interim.   Rosalio Macadamia, RN, ANP-C [facility] [address] [place], [place]  [address]

## 2013-09-19 NOTE — Patient Instructions (Signed)
I think you are doing ok  You may cut your aspirin back to 81 mg a day  Stay on your current medicines  Dr. Elease Hashimoto will see you back if needed.   Call the [facility] office at [phone] if you have any questions, problems or concerns.

## 2013-09-20 NOTE — Assessment & Plan Note (Signed)
No recurrent chest pain after discharge from hospital. Pt has appt with cardiology in several days for outpatient evaluation. Will await cardiology recommendations.

## 2013-09-20 NOTE — Assessment & Plan Note (Signed)
No flare of iritis now, but discussed history with patient. Asked her to call her ophthalmologist to set up an appointment for follow up. Also instructed her to go to the ER immediately if she has another episode where she is unable to see.

## 2013-09-20 NOTE — Assessment & Plan Note (Signed)
BP at goal and pt reports compliance with medications. Continue current regimen.

## 2014-01-30 ENCOUNTER — Telehealth: Payer: Self-pay | Admitting: Family Medicine

## 2014-01-30 DIAGNOSIS — I10 Essential (primary) hypertension: Secondary | ICD-10-CM

## 2014-01-30 NOTE — Telephone Encounter (Signed)
Refill request for Amlodipine. Patient been out all weekend and states she can really feel the difference. Please send to [facility] on [address] asap.

## 2014-01-31 MED ORDER — AMLODIPINE BESYLATE 10 MG PO TABS: 10.0000 mg | ORAL_TABLET | Freq: Every day | ORAL | Status: DC

## 2014-01-31 NOTE — Telephone Encounter (Signed)
LMVM asking patient to please call and schedule OV.  Doctor Phillips

## 2014-01-31 NOTE — Telephone Encounter (Signed)
Will send in rx. Please inform patient that she needs to schedule appointment to follow up with me for her blood pressure.  Leeanne Rio, MD

## 2014-02-14 ENCOUNTER — Encounter: Payer: Self-pay | Admitting: Family Medicine

## 2014-02-14 ENCOUNTER — Ambulatory Visit (INDEPENDENT_AMBULATORY_CARE_PROVIDER_SITE_OTHER): Payer: BC Managed Care – PPO | Admitting: Family Medicine

## 2014-02-14 VITALS — BP 135/86 | HR 53 | Temp 98.1°F | Wt 175.0 lb

## 2014-02-14 DIAGNOSIS — I10 Essential (primary) hypertension: Secondary | ICD-10-CM

## 2014-02-14 DIAGNOSIS — M722 Plantar fascial fibromatosis: Secondary | ICD-10-CM

## 2014-02-14 MED ORDER — AMLODIPINE BESYLATE 10 MG PO TABS: 10.0000 mg | ORAL_TABLET | Freq: Every day | ORAL | Status: DC

## 2014-02-14 MED ORDER — NITROGLYCERIN 0.4 MG SL SUBL: 0.4000 mg | SUBLINGUAL_TABLET | SUBLINGUAL | Status: DC | PRN

## 2014-02-14 NOTE — Assessment & Plan Note (Signed)
Patient has bilateral plantar fasciitis. -Patient counseled on gentle stretching immediately before getting out of bed -As the symptoms are mild may use over-the-counter pain medications as needed

## 2014-02-14 NOTE — Progress Notes (Signed)
   Subjective:    Patient ID: Sheila Fernandez, female    DOB: [birth year], 63 y.o.   MRN: [id number]  HPI 63 year old female presents for followup of hypertension.  Hypertension-patient currently on amlodipine 10 mg daily, hydrochlorothiazide 25 mg daily, and Lopressor 100 mg twice daily, patient did run out of her amlodipine last week for number of days and at that time had some lightheadedness and dizziness, no syncope or loss of consciousness at that time, the symptoms resolved after restarting her amlodipine, she thinks her symptoms may have been related to an elevated blood pressure, she reports no chest pain, no current vision changes, no current headache, patient does request refill of nitroglycerin however she has not needed for many months  Plantar fasciitis-patient reports bilateral heel pain immediately in the morning the results after a few minutes, she is asymptomatic throughout the day, she does not require anything for pain   Review of Systems  Constitutional: Negative for fever, chills and fatigue.  Respiratory: Negative for cough, choking and shortness of breath.   Cardiovascular: Negative for chest pain and leg swelling.       Objective:   Physical Exam Vitals: Reviewed General: Pleasant African American female, no acute distress HEENT: Normocephalic, pupils are equal round and reactive to light, extraocular movements are intact, no scleral icterus, moist mucous members, neck was supple, no anterior posterior cervical lymphadenopathy Cardiac: Regular in rhythm, S1 and S2 present, no murmurs, no heaves or thrills Respiratory: Clear to patient bilaterally, normal effort Extremities: No lower extremity edema, 2+ radial pulses bilaterally, 2+ dorsalis pedis pulses bilateral  MSK: Minimal tenderness over bilateral medial aspect of the plantar surface of the heel     Assessment & Plan:  Please see problem specific assessment and plan.

## 2014-02-14 NOTE — Patient Instructions (Signed)
High Blood Pressure - your blood pressure is well controlled today, a refill of Amlodipine has been sent to your pharmacy.   Please scheduled a follow up appointment with Dr. Ardelia Mems to discuss your Iritis and to have an Annual Physical Exam.

## 2014-02-14 NOTE — Assessment & Plan Note (Signed)
Blood pressure at goal today.  Continue current regimen. 

## 2014-03-20 ENCOUNTER — Encounter (HOSPITAL_COMMUNITY): Payer: Self-pay | Admitting: Emergency Medicine

## 2014-03-20 ENCOUNTER — Emergency Department (HOSPITAL_COMMUNITY)
Admission: EM | Admit: 2014-03-20 | Discharge: 2014-03-20 | Disposition: A | Discharge status: Home / Self Care | Payer: BC Managed Care – PPO | Source: Home / Self Care | Attending: Emergency Medicine | Admitting: Emergency Medicine

## 2014-03-20 DIAGNOSIS — H20029 Recurrent acute iridocyclitis, unspecified eye: Secondary | ICD-10-CM

## 2014-03-20 DIAGNOSIS — H20022 Recurrent acute iridocyclitis, left eye: Secondary | ICD-10-CM

## 2014-03-20 MED ORDER — PREDNISOLONE ACETATE 1 % OP SUSP: 2.0000 [drp] | Freq: Four times a day (QID) | OPHTHALMIC | Status: DC

## 2014-03-20 NOTE — ED Provider Notes (Signed)
CSN: [id number]     Arrival date & time 03/20/14  1611 History   First MD Initiated Contact with Patient 03/20/14 1801     Chief Complaint  Patient presents with  . Eye Problem   (Consider location/radiation/quality/duration/timing/severity/associated sxs/prior Treatment) HPI  Left eye pain and inflammation - pt with recurrent iritis, left sided, painful but not impacting vision; started approximately 10 years ago, pt evaluated at ophthalmologist and told that cause is systemic inflammation and treated with steroid drops, but the etiology of systemic illness is unknown; she notes chronic back pain but denies joint swelling, no genital lesions, no weakness, hx of treated chlamydia in 2001 and not sexually active since that time   Past Medical History  Diagnosis Date  . Adenomyosis   . Uterine fibroid   . Hypertension   . Iritis, recurrent   . Arthritis    Past Surgical History  Procedure Laterality Date  . Cesarean section    . Cholecystectomy    . Breast lumpectomy    . Foot surgery     Family History  Problem Relation Age of Onset  . Cancer Mother 30    Pancreatic  . Hypertension Mother   . Stroke Father   . Cancer Father 41    Prostate  . Hypertension Father   . Prostate cancer Father   . Cancer Sister 90    2 sisters d/c cancer less than 12yo  . Diabetes Sister   . Hypertension Sister   . Diabetes Daughter   . Early death Daughter   . Hypertension Daughter   . Kidney disease Daughter   . Diabetes Brother   . Hypertension Brother   . Prostate cancer Brother    History  Substance Use Topics  . Smoking status: Former Smoker    Quit date: 11/28/2003  . Smokeless tobacco: Never Used  . Alcohol Use: No   OB History   Grav Para Term Preterm Abortions TAB SAB Ect Mult Living                 Review of Systems Positive for left eye pain and inflammation without vision changes Negative for fever, chills, nausea, vomiting, joint swelling, genital ulcers,  weakness Allergies  Codeine  Home Medications   Prior to Admission medications   Medication Sig Start Date End Date Taking? Authorizing Provider  amLODipine (NORVASC) 10 MG tablet Take 1 tablet (10 mg total) by mouth daily. 02/14/14  Yes Lupita Dawn, MD  aspirin 81 MG chewable tablet Chew 1 tablet (81 mg total) by mouth once. 09/19/13  Yes Burtis Junes, NP  hydrochlorothiazide (HYDRODIURIL) 25 MG tablet Take 1 tablet (25 mg total) by mouth daily. 09/06/13 09/06/14 Yes Langston Masker, MD  metoprolol (LOPRESSOR) 100 MG tablet Take 0.5 tablets (50 mg total) by mouth 2 (two) times daily. 09/06/13  Yes Langston Masker, MD  Multiple Vitamin (MULTIVITAMIN WITH MINERALS) TABS tablet Take 1 tablet by mouth daily.   Yes Historical Provider, MD  nitroGLYCERIN (NITROSTAT) 0.4 MG SL tablet Place 1 tablet (0.4 mg total) under the tongue every 5 (five) minutes as needed. Call doctor if single tab used 02/14/14   Lupita Dawn, MD  prednisoLONE acetate (OMNIPRED) 1 % ophthalmic suspension Place 2 drops into the left eye 4 (four) times daily. 03/20/14   Angelica Ran, MD   BP 147/95  Pulse 55  Temp(Src) 97.8 F (36.6 C) (Oral)  Resp 16  SpO2 100% Physical Exam Gen. Elderly African American female,  pleasant, non-ill-appearing Eyes: inflammation of left anterior eye with symmetric and equally reactive pupils; Woods lamp exam negative for increased uptake Head: no temporal artery tenderness Cardiovascular: Regular Rate and rhythm Lungs: Clear to auscultation bilaterally Joints: No swelling nontender   ED Course  Procedures (including critical care time) Labs Review Labs Reviewed - No data to display  Imaging Review No results found.   MDM   1. Iritis, recurrent, left    No evidence of infection. Will prescribe prednisolone drops since effective in past. Recommend f/u with PCP and ophthalmologist.     Angelica Ran, MD 03/20/14 [phone]

## 2014-03-20 NOTE — ED Notes (Signed)
Pt c/o left eye redness and pain onset 1200 Reports she was fine when she woke up Hx of chronic iritis Denies inj/trauma, BV Sx today include HA Alert w/no signs of acute distress

## 2014-03-20 NOTE — Discharge Instructions (Signed)
Please call Dr. Gershon Crane regarding this issue tomorrow. Follow up with your regular doctor as well for a work up for systemic inflammation.   Use the steroids for the inflammation.   Take Care,   Dr. Maricela Bo

## 2014-03-21 NOTE — ED Provider Notes (Signed)
Medical screening examination/treatment/procedure(s) were performed by resident physician or non-physician practitioner and as supervising physician I was immediately available for consultation/collaboration.   Pauline Good MD.   Billy Fischer, MD 03/21/14 [phone]

## 2014-08-07 ENCOUNTER — Emergency Department (HOSPITAL_COMMUNITY)
Admission: EM | Admit: 2014-08-07 | Discharge: 2014-08-07 | Disposition: A | Discharge status: Home / Self Care | Payer: 59 | Attending: Emergency Medicine | Admitting: Emergency Medicine

## 2014-08-07 ENCOUNTER — Encounter (HOSPITAL_COMMUNITY): Payer: Self-pay

## 2014-08-07 ENCOUNTER — Emergency Department (HOSPITAL_COMMUNITY): Payer: 59

## 2014-08-07 DIAGNOSIS — M5136 Other intervertebral disc degeneration, lumbar region: Secondary | ICD-10-CM | POA: Insufficient documentation

## 2014-08-07 DIAGNOSIS — Z79899 Other long term (current) drug therapy: Secondary | ICD-10-CM | POA: Insufficient documentation

## 2014-08-07 DIAGNOSIS — Z7951 Long term (current) use of inhaled steroids: Secondary | ICD-10-CM | POA: Insufficient documentation

## 2014-08-07 DIAGNOSIS — Z8742 Personal history of other diseases of the female genital tract: Secondary | ICD-10-CM | POA: Insufficient documentation

## 2014-08-07 DIAGNOSIS — Y990 Civilian activity done for income or pay: Secondary | ICD-10-CM | POA: Diagnosis not present

## 2014-08-07 DIAGNOSIS — Z87891 Personal history of nicotine dependence: Secondary | ICD-10-CM | POA: Insufficient documentation

## 2014-08-07 DIAGNOSIS — W010XXA Fall on same level from slipping, tripping and stumbling without subsequent striking against object, initial encounter: Secondary | ICD-10-CM | POA: Insufficient documentation

## 2014-08-07 DIAGNOSIS — S4992XA Unspecified injury of left shoulder and upper arm, initial encounter: Secondary | ICD-10-CM | POA: Diagnosis present

## 2014-08-07 DIAGNOSIS — Y9289 Other specified places as the place of occurrence of the external cause: Secondary | ICD-10-CM | POA: Insufficient documentation

## 2014-08-07 DIAGNOSIS — Z8669 Personal history of other diseases of the nervous system and sense organs: Secondary | ICD-10-CM | POA: Insufficient documentation

## 2014-08-07 DIAGNOSIS — M1990 Unspecified osteoarthritis, unspecified site: Secondary | ICD-10-CM | POA: Insufficient documentation

## 2014-08-07 DIAGNOSIS — S3992XA Unspecified injury of lower back, initial encounter: Secondary | ICD-10-CM | POA: Diagnosis not present

## 2014-08-07 DIAGNOSIS — Z7982 Long term (current) use of aspirin: Secondary | ICD-10-CM | POA: Diagnosis not present

## 2014-08-07 DIAGNOSIS — W19XXXA Unspecified fall, initial encounter: Secondary | ICD-10-CM

## 2014-08-07 DIAGNOSIS — I10 Essential (primary) hypertension: Secondary | ICD-10-CM | POA: Insufficient documentation

## 2014-08-07 DIAGNOSIS — Y9389 Activity, other specified: Secondary | ICD-10-CM | POA: Insufficient documentation

## 2014-08-07 DIAGNOSIS — M19012 Primary osteoarthritis, left shoulder: Secondary | ICD-10-CM | POA: Insufficient documentation

## 2014-08-07 DIAGNOSIS — M25512 Pain in left shoulder: Secondary | ICD-10-CM

## 2014-08-07 DIAGNOSIS — M19019 Primary osteoarthritis, unspecified shoulder: Secondary | ICD-10-CM

## 2014-08-07 MED ORDER — HYDROCODONE-ACETAMINOPHEN 5-325 MG PO TABS
1.0000 | ORAL_TABLET | Freq: Once | ORAL | Status: AC
  Administered 2014-08-07: 1 via ORAL
  Filled 2014-08-07 (×2): qty 1

## 2014-08-07 NOTE — ED Provider Notes (Signed)
CSN: [id number]     Arrival date & time 08/07/14  0758 History   First MD Initiated Contact with Patient 08/07/14 0800     Chief Complaint  Patient presents with  . Fall  . Shoulder Pain     (Consider location/radiation/quality/duration/timing/severity/associated sxs/prior Treatment) HPI  Sheila Fernandez is a 63 y.o. female with PMH of HTN, arthritis presenting 2 hours after a fall. Patient was ambulatory and a sliding chair slid in front of her and made her loose her step and she fell backwards. Patient denies head injury or loss of consciousness headache blurred vision or slurred speech or weakness. Patient fell on her back and rolled to left shoulder. Patient comes in with complaint of back pain and left shoulder pain. No numbness tingling or weakness. Patient denies history of back pain or shoulder pain.   Past Medical History  Diagnosis Date  . Adenomyosis   . Uterine fibroid   . Hypertension   . Iritis, recurrent   . Arthritis    Past Surgical History  Procedure Laterality Date  . Cesarean section    . Cholecystectomy    . Breast lumpectomy    . Foot surgery     Family History  Problem Relation Age of Onset  . Cancer Mother 5    Pancreatic  . Hypertension Mother   . Stroke Father   . Cancer Father 59    Prostate  . Hypertension Father   . Prostate cancer Father   . Cancer Sister 37    2 sisters d/c cancer less than 7yo  . Diabetes Sister   . Hypertension Sister   . Diabetes Daughter   . Early death Daughter   . Hypertension Daughter   . Kidney disease Daughter   . Diabetes Brother   . Hypertension Brother   . Prostate cancer Brother    History  Substance Use Topics  . Smoking status: Former Smoker    Quit date: 11/28/2003  . Smokeless tobacco: Never Used  . Alcohol Use: No   OB History    No data available     Review of Systems  Constitutional: Negative for fever and chills.  Eyes: Negative for visual disturbance.  Respiratory: Negative  for cough and shortness of breath.   Gastrointestinal: Negative for nausea, vomiting and diarrhea.  Musculoskeletal: Positive for back pain. Negative for gait problem.  Skin: Negative for rash.  Neurological: Negative for weakness and headaches.      Allergies  Codeine  Home Medications   Prior to Admission medications   Medication Sig Start Date End Date Taking? Authorizing Provider  amLODipine (NORVASC) 10 MG tablet Take 1 tablet (10 mg total) by mouth daily. Patient taking differently: Take 10 mg by mouth every morning.  02/14/14  Yes Lupita Dawn, MD  Cholecalciferol (VITAMIN D PO) Take 1 tablet by mouth daily.   Yes Historical Provider, MD  hydrochlorothiazide (HYDRODIURIL) 25 MG tablet Take 1 tablet (25 mg total) by mouth daily. 09/06/13 09/06/14 Yes Bernadene Bell, MD  MAGNESIUM PO Take 1 tablet by mouth daily.   Yes Historical Provider, MD  metoprolol (LOPRESSOR) 100 MG tablet Take 0.5 tablets (50 mg total) by mouth 2 (two) times daily. 09/06/13  Yes Bernadene Bell, MD  Multiple Vitamin (MULTIVITAMIN WITH MINERALS) TABS tablet Take 1 tablet by mouth daily as needed (For supplement.).    Yes Historical Provider, MD  nitroGLYCERIN (NITROSTAT) 0.4 MG SL tablet Place 1 tablet (0.4 mg total) under the tongue  every 5 (five) minutes as needed. Call doctor if single tab used Patient taking differently: Place 0.4 mg under the tongue every 5 (five) minutes as needed for chest pain.  02/14/14  Yes Lupita Dawn, MD  OVER THE COUNTER MEDICATION Take 1 capsule by mouth daily. Carida 7 - [employer]   Yes Historical Provider, MD  aspirin 81 MG chewable tablet Chew 1 tablet (81 mg total) by mouth once. 09/19/13   Burtis Junes, NP  prednisoLONE acetate (OMNIPRED) 1 % ophthalmic suspension Place 2 drops into the left eye 4 (four) times daily. 03/20/14   Angelica Ran, MD   BP 164/86 mmHg  Pulse 57  Temp(Src) 98.5 F (36.9 C) (Oral)  Resp 14  SpO2 97% Physical Exam   Constitutional: She appears well-developed and well-nourished. No distress.  HENT:  Head: Normocephalic and atraumatic.  Eyes: Conjunctivae are normal. Right eye exhibits no discharge. Left eye exhibits no discharge.  Cardiovascular: Normal rate, regular rhythm and normal heart sounds.   Pulmonary/Chest: Effort normal and breath sounds normal. No respiratory distress. She has no wheezes.  Abdominal: Soft. Bowel sounds are normal. She exhibits no distension. There is no tenderness.  Musculoskeletal:  No midline back tenderness, step off or crepitus. Mild Right and Left sided lower back tenderness. No CVA tenderness. No clavicular step off or crepitus. FROM of shoulder without pain. 2+ radial pulses equal bilaterally. 5/5 strength in upper extremity.   Neurological: She is alert. Coordination normal.  Equal muscle tone. 5/5 strength in lower extremities. DTR equal and intact. Negative straight leg test. Normal gait.   Skin: Skin is warm and dry. She is not diaphoretic.  Nursing note and vitals reviewed.   ED Course  Procedures (including critical care time) Labs Review Labs Reviewed - No data to display  Imaging Review Dg Lumbar Spine Complete  08/07/2014   CLINICAL DATA:  The patient slipped and fell when a chair fell in front of her, mild low back pain  EXAM: LUMBAR SPINE - COMPLETE 4+ VIEW  COMPARISON:  None.  FINDINGS: There is no evidence of lumbar spine fracture. Alignment is normal. Mild anterior osteophytosis is identified at L4. There is decreased intervertebral space at L5-S1. Patient is status post prior cholecystectomy.  IMPRESSION: There is no acute fracture or dislocation. Mild degenerative joint changes of the lower lumbar spine.   Electronically Signed   By: Abelardo Diesel M.D.   On: 08/07/2014 09:18   Dg Shoulder Left  08/07/2014   CLINICAL DATA:  Status post fall from a chair with left shoulder pain and stiffness  EXAM: LEFT SHOULDER - 2+ VIEW  COMPARISON:  None.   FINDINGS: There is no evidence of fracture or dislocation. There are degenerative joint changes of the left acromioclavicular joint. Soft tissues are unremarkable.  IMPRESSION: No acute fracture or dislocation.   Electronically Signed   By: Abelardo Diesel M.D.   On: 08/07/2014 09:12     EKG Interpretation None      Meds given in ED:  Medications  HYDROcodone-acetaminophen (NORCO/VICODIN) 5-325 MG per tablet 1 tablet (1 tablet Oral Given 08/07/14 0957)    New Prescriptions   No medications on file      MDM   Final diagnoses:  Fall  Degenerative joint disease of acromioclavicular joint  Lumbar degenerative disc disease  Shoulder pain, left   Pt presenting after fall, landing on her buttock and rolling to left shoulder. Patient X-Ray negative for obvious fracture or dislocation.  Degenerative changes in low lumbar and AC joint. Pain managed in ED. Pt to follow up with PCP in one week as needed. RICE and conservative therapy recommended and discussed. Patient will be dc home & is agreeable with above plan.  Discussed return precautions with patient. Discussed all results and patient verbalizes understanding and agrees with plan.      Pura Spice, PA-C 08/07/14 [place], MD 08/08/14 [phone]

## 2014-08-07 NOTE — ED Notes (Signed)
Patient transported to X-ray 

## 2014-08-07 NOTE — Discharge Instructions (Signed)
Return to the emergency room with worsening of symptoms, new symptoms or with symptoms that are concerning, especially fevers, chills, swelling, redness, warmth. RICE: Rest, Ice (three cycles of 20 mins on, 24mins off at least twice a day), compression/brace, elevation. Heating pad works well for back pain. Ibuprofen 400mg  (2 tablets 200mg ) every 5-6 hours for 3-5 days and then as needed for pain. Follow up with PCP/orthopedist if symptoms worsen or are persistent.     Acromioclavicular Injuries The AC (acromioclavicular) joint is the joint in the shoulder where the collarbone (clavicle) meets the shoulder blade (scapula). The part of the shoulder blade connected to the collarbone is called the acromion. Common problems with and treatments for the [facility] joint are detailed below. ARTHRITIS Arthritis occurs when the joint has been injured and the smooth padding between the joints (cartilage) is lost. This is the wear and tear seen in most joints of the body if they have been overused. This causes the joint to produce pain and swelling which is worse with activity.  AC JOINT SEPARATION AC joint separation means that the ligaments connecting the acromion of the shoulder blade and collarbone have been damaged, and the two bones no longer line up. AC separations can be anywhere from mild to severe, and are "graded" depending upon which ligaments are torn and how badly they are torn.  Grade I Injury: the least damage is done, and the [facility] joint still lines up.  Grade II Injury: damage to the ligaments which reinforce the [facility] joint. In a Grade II injury, these ligaments are stretched but not entirely torn. When stressed, the [facility] joint becomes painful and unstable.  Grade III Injury: AC and secondary ligaments are completely torn, and the collarbone is no longer attached to the shoulder blade. This results in deformity; a prominence of the end of the clavicle. AC JOINT FRACTURE AC joint fracture means that  there has been a break in the bones of the [facility] joint, usually the end of the clavicle. TREATMENT TREATMENT OF AC ARTHRITIS  There is currently no way to replace the cartilage damaged by arthritis. The best way to improve the condition is to decrease the activities which aggravate the problem. Application of ice to the joint helps decrease pain and soreness (inflammation). The use of non-steroidal anti-inflammatory medication is helpful.  If less conservative measures do not work, then cortisone shots (injections) may be used. These are anti-inflammatories; they decrease the soreness in the joint and swelling.  If non-surgical measures fail, surgery may be recommended. The procedure is generally removal of a portion of the end of the clavicle. This is the part of the collarbone closest to your acromion which is stabilized with ligaments to the acromion of the shoulder blade. This surgery may be performed using a tube-like instrument with a light (arthroscope) for looking into a joint. It may also be performed as an open surgery through a small incision by the surgeon. Most patients will have good range of motion within 6 weeks and may return to all activity including sports by 8-12 weeks, barring complications. TREATMENT OF AN AC SEPARATION  The initial treatment is to decrease pain. This is best accomplished by immobilizing the arm in a sling and placing an ice pack to the shoulder for 20 to 30 minutes every 2 hours as needed. As the pain starts to subside, it is important to begin moving the fingers, wrist, elbow and eventually the shoulder in order to prevent a stiff or "frozen" shoulder.  Instruction on when and how much to move the shoulder will be provided by your caregiver. The length of time needed to regain full motion and function depends on the amount or grade of the injury. Recovery from a Grade I AC separation usually takes 10 to 14 days, whereas a Grade III may take 6 to 8 weeks.  Grade I and  II separations usually do not require surgery. Even Grade III injuries usually allow return to full activity with few restrictions. Treatment is also based on the activity demands of the injured shoulder. For example, a high level quarterback with an injured throwing arm will receive more aggressive treatment than someone with a desk job who rarely uses his/her arm for strenuous activities. In some cases, a painful lump may persist which could require a later surgery. Surgery can be very successful, but the benefits must be weighed against the potential risks. TREATMENT OF AN AC JOINT FRACTURE Fracture treatment depends on the type of fracture. Sometimes a splint or sling may be all that is required. Other times surgery may be required for repair. This is more frequently the case when the ligaments supporting the clavicle are completely torn. Your caregiver will help you with these decisions and together you can decide what will be the best treatment. HOME CARE INSTRUCTIONS   Apply ice to the injury for 15-20 minutes each hour while awake for 2 days. Put the ice in a plastic bag and place a towel between the bag of ice and skin.  If a sling has been applied, wear it constantly for as long as directed by your caregiver, even at night. The sling or splint can be removed for bathing or showering or as directed. Be sure to keep the shoulder in the same place as when the sling is on. Do not lift the arm.  If a figure-of-eight splint has been applied it should be tightened gently by another person every day. Tighten it enough to keep the shoulders held back. Allow enough room to place the index finger between the body and strap. Loosen the splint immediately if there is numbness or tingling in the hands.  Take over-the-counter or prescription medicines for pain, discomfort or fever as directed by your caregiver.  If you or your child has received a follow up appointment, it is very important to keep that  appointment in order to avoid long term complications, chronic pain or disability. SEEK MEDICAL CARE IF:   The pain is not relieved with medications.  There is increased swelling or discoloration that continues to get worse rather than better.  You or your child has been unable to follow up as instructed.  There is progressive numbness and tingling in the arm, forearm or hand. SEEK IMMEDIATE MEDICAL CARE IF:   The arm is numb, cold or pale.  There is increasing pain in the hand, forearm or fingers. MAKE SURE YOU:   Understand these instructions.  Will watch your condition.  Will get help right away if you are not doing well or get worse. Document Released: 06/25/2005 Document Revised: 12/08/2011 Document Reviewed: 12/18/2008 [facility] Patient Information 2015 [place], [place]. This information is not intended to replace advice given to you by your health care provider. Make sure you discuss any questions you have with your health care provider.

## 2014-08-07 NOTE — ED Notes (Signed)
PA at bedside.

## 2014-08-07 NOTE — ED Notes (Signed)
Per EMS, Pt, from workplace, c/o L shoulder pain after a trip and fall.  Pain score 7/10.  Pt reports tripping over a chair.  Denies numbness and tingling.  Pt noted to utilize L arm while moving onto ED stretcher.

## 2014-08-07 NOTE — ED Notes (Signed)
Pt noted to have full ROM and removed sweater and bra w/o difficulty.

## 2014-08-07 NOTE — ED Notes (Signed)
Pt escorted to discharge window. Verbalized understanding discharge instructions. In no acute distress.   

## 2014-08-07 NOTE — ED Notes (Signed)
Bed: WA16 Expected date:  Expected time:  Means of arrival:  Comments: EMS fall 

## 2014-08-07 NOTE — ED Notes (Signed)
Pt provided graham crackers and orange juice.

## 2014-08-15 ENCOUNTER — Encounter: Payer: Self-pay | Admitting: Family Medicine

## 2014-08-15 ENCOUNTER — Ambulatory Visit (INDEPENDENT_AMBULATORY_CARE_PROVIDER_SITE_OTHER): Payer: 59 | Admitting: Family Medicine

## 2014-08-15 VITALS — BP 153/95 | HR 71 | Temp 98.5°F | Ht 63.0 in | Wt 175.0 lb

## 2014-08-15 DIAGNOSIS — I10 Essential (primary) hypertension: Secondary | ICD-10-CM

## 2014-08-15 DIAGNOSIS — M67922 Unspecified disorder of synovium and tendon, left upper arm: Secondary | ICD-10-CM

## 2014-08-15 DIAGNOSIS — M67929 Unspecified disorder of synovium and tendon, unspecified upper arm: Secondary | ICD-10-CM | POA: Insufficient documentation

## 2014-08-15 MED ORDER — AMLODIPINE BESYLATE 10 MG PO TABS: 10.0000 mg | ORAL_TABLET | Freq: Every morning | ORAL | Status: DC

## 2014-08-15 MED ORDER — METOPROLOL TARTRATE 100 MG PO TABS: 50.0000 mg | ORAL_TABLET | Freq: Two times a day (BID) | ORAL | Status: DC

## 2014-08-15 NOTE — Assessment & Plan Note (Signed)
Patient is experiencing significant tenderness over the long head of the biceps tendon. Yergason's test positive. No evidence of instability or impingement. No acromioclavicular joint tenderness. Strength maintained. Reflexes benign.  Pain likely secondary to irritation/trauma to the long head of the biceps tendon. Pain experienced on the posterior aspect of her shoulder likely due to muscle soreness/irritation secondary to trauma from the fall. At this time I am doubtful that any structural damage occurred during this fall. She has no weakness or decreased range of motion at this time. I've encouraged her to use the affected arm as pain allows.  - Pulses irritation should self resolve over the course of the next 1-3 weeks. I've encouraged patient to continue taking her naproxen for pain relief. I asked that she withhold taking additional ibuprofen during this time. I encouraged the use of ice and/or heat for symptomatic relief. I asked that she make a follow-up appointment in 4 weeks if her pain persists or worsens. I also encouraged her to perform regular range of motion exercises in order to reduce the risk of adhesive capsulitis.

## 2014-08-15 NOTE — Patient Instructions (Signed)
It was a pleasure seeing you today in our clinic. Today we discussed shoulder pain. Here is the treatment plan we have discussed and agreed upon together:   - I encourage you to continue using your shoulder as the pain will tolerate it. - Please try to perform the range of motion exercises we discussed 2-3 times a day (it may be easiest to do this before/after every meal). - Continue treating your pain with the naproxen you have been using. It may be wise to only treat with this and not to use ibuprofen. - please do not hesitate to contact our office to make a follow-up appointment with Dr. Ardelia Mems, myself, or any of our other providers if your pain worsens or does not improve over the next 4 weeks.

## 2014-08-15 NOTE — Progress Notes (Signed)
HPI  Patient presents today for ED follow-up. Patient presents today for follow-up after reporting to the ED after a fall she experienced with family. The following occurred when a chair fell in front of her and she tripped over the chair. She does not remember whether or not she fell backwards or forwards. Since that time she's had acute onset left shoulder pain. She states that pain was initially experienced in the anterior aspect of her shoulder, she then experienced pain primarily felt to the posterior aspect of her shoulder over the scapula and across the rhomboids. This pain over the rhomboids increased and peaked approximately 48 hours after the injury was experienced. Her anterior shoulder pain has stayed relatively constant since the inciting event. Over the past 48 hours it has gradually improved. Patient is still able to use her left arm. She has been taking ibuprofen and naproxen for pain relief. This is provided adequate pain relief at this time.  Patient is primarily concerned with any permanent damage she may have experienced during this fall. Pain has been gradually improving since the inciting event. No effusions have been experienced to date.  Patient denies any confusion dizziness lightheadedness shortness of breath vertigo diaphoresis chest pain fevers chills nausea vomiting diarrhea.  Smoking status noted ROS: Per HPI  Objective: BP 153/95 mmHg  Pulse 71  Temp(Src) 98.5 F (36.9 C) (Oral)  Ht 5\' 3"  (1.6 m)  Wt 175 lb (79.379 kg)  BMI 31.01 kg/m2 Gen: NAD, alert, cooperative with exam HEENT: NCAT, EOMI, PERRL CV: RRR, good S1/S2, no murmur Resp: CTABL, no wheezes, non-labored Ext: Full ROM, no effusions or deformities present. TTP over left rhomboid and proximal left long head of the biceps. No bony abnormalities appreciated. Yergason's positive (L), speed's test negative (bilat), no evidence of instability/impingement. No AC joint tenderness. Strength 5/5 bilaterally.  +2 reflexes bilaterally. Neuro: Alert and oriented, No gross deficits   Assessment and plan:  Biceps tendinopathy Patient is experiencing significant tenderness over the long head of the biceps tendon. Yergason's test positive. No evidence of instability or impingement. No acromioclavicular joint tenderness. Strength maintained. Reflexes benign.  Pain likely secondary to irritation/trauma to the long head of the biceps tendon. Pain experienced on the posterior aspect of her shoulder likely due to muscle soreness/irritation secondary to trauma from the fall. At this time I am doubtful that any structural damage occurred during this fall. She has no weakness or decreased range of motion at this time. I've encouraged her to use the affected arm as pain allows.  - Pulses irritation should self resolve over the course of the next 1-3 weeks. I've encouraged patient to continue taking her naproxen for pain relief. I asked that she withhold taking additional ibuprofen during this time. I encouraged the use of ice and/or heat for symptomatic relief. I asked that she make a follow-up appointment in 4 weeks if her pain persists or worsens. I also encouraged her to perform regular range of motion exercises in order to reduce the risk of adhesive capsulitis.  HYPERTENSION, BENIGN SYSTEMIC Patient requested medication refills on amlodipine and metoprolol.  Refills ordered.    No orders of the defined types were placed in this encounter.    Meds ordered this encounter  Medications  . amLODipine (NORVASC) 10 MG tablet    Sig: Take 1 tablet (10 mg total) by mouth every morning.    Dispense:  90 tablet    Refill:  1  . metoprolol (LOPRESSOR) 100 MG tablet  Sig: Take 0.5 tablets (50 mg total) by mouth 2 (two) times daily.    Dispense:  60 tablet    Refill:  3     Elberta Leatherwood, MD,MS,  PGY1 08/15/2014 5:24 PM

## 2014-08-15 NOTE — Assessment & Plan Note (Signed)
Patient requested medication refills on amlodipine and metoprolol.  Refills ordered.

## 2014-08-16 NOTE — Progress Notes (Signed)
Patient ID: Sheila Fernandez, female   DOB: Dec 05, 1950, 63 y.o.   MRN: [id number] Precepted and reviewed.  Agree with documentation and management of Dr. Alease Frame

## 2014-09-07 ENCOUNTER — Ambulatory Visit (INDEPENDENT_AMBULATORY_CARE_PROVIDER_SITE_OTHER): Payer: 59 | Admitting: Family Medicine

## 2014-09-07 ENCOUNTER — Encounter: Payer: Self-pay | Admitting: Family Medicine

## 2014-09-07 VITALS — BP 135/81 | HR 75 | Temp 98.4°F | Ht 63.0 in | Wt 167.2 lb

## 2014-09-07 DIAGNOSIS — M67922 Unspecified disorder of synovium and tendon, left upper arm: Secondary | ICD-10-CM

## 2014-09-07 DIAGNOSIS — M25512 Pain in left shoulder: Secondary | ICD-10-CM

## 2014-09-07 MED ORDER — TRAMADOL HCL 50 MG PO TABS: 50.0000 mg | ORAL_TABLET | Freq: Three times a day (TID) | ORAL | Status: DC | PRN

## 2014-09-07 MED ORDER — CYCLOBENZAPRINE HCL 5 MG PO TABS: 5.0000 mg | ORAL_TABLET | Freq: Three times a day (TID) | ORAL | Status: DC | PRN

## 2014-09-07 NOTE — Assessment & Plan Note (Signed)
Precepted with Attending Dr. Nori Riis.  Physical exam remains consistent with biceps tendinopathy. Referral to PT placed. Will continue with naproxen.  She given Flexeril for spasm (noted in trapezius muscle) and tramadol to be used as needed for pain.

## 2014-09-07 NOTE — Progress Notes (Signed)
   Subjective:    Patient ID: Sheila Fernandez, female    DOB: 10-07-1950, 63 y.o.   MRN: [id number]  HPI 63 year old female with history of hypertension presents today with complaints of left shoulder pain.  1) Left Shoulder Pain  Patient suffered a fall on 11/9. She was seen in the ED and evaluated after having complaints of left shoulder pain.  X-ray was negative at that time.  She was seen for follow up on 11/17.  Physical exam at that time was suggestive of biceps tendinopathy. She was treated conservatively with exercises and naproxen.  Patient presents today with continued left shoulder pain.  She reports she's had some mild relief with naproxen.  She reports her pain is currently 6 out of 10 in severity. Pain is located left trapezius region as well as the left anterior shoulder.  Pain is worse with certain motions.   No reports of weakness, numbness, tingling in her upper extremity.  Review of Systems Per HPI    Objective:   Physical Exam Filed Vitals:   09/07/14 0949  BP: 135/81  Pulse: 75  Temp: 98.4 F (36.9 C)   Exam: General: well appearing female in NAD.  Shoulder:  Left Inspection reveals no abnormalities, atrophy or asymmetry. Palpation - bicipital groove - tender to palpation.   ROM is full. Rotator cuff strength normal throughout. No signs of impingement with negative Neer and Hawkin's tests, empty can. Speeds +; Yergason's negative.  No painful arc and no drop arm sign.     Assessment & Plan:  See Problem List

## 2014-09-07 NOTE — Patient Instructions (Signed)
It was nice to see you today.  Continue to use the naproxen.  Use the tramadol as needed for pain.  Use the flexeril as needed for pain/spasm.  Follow up closely with your PCP.  Take Care  [facility] DO

## 2014-10-26 ENCOUNTER — Other Ambulatory Visit: Payer: Self-pay | Admitting: Rheumatology

## 2014-10-26 DIAGNOSIS — M461 Sacroiliitis, not elsewhere classified: Secondary | ICD-10-CM

## 2014-11-07 ENCOUNTER — Ambulatory Visit: Payer: 59 | Attending: Family Medicine | Admitting: Physical Therapy

## 2014-11-07 ENCOUNTER — Encounter: Payer: Self-pay | Admitting: Physical Therapy

## 2014-11-07 DIAGNOSIS — M67922 Unspecified disorder of synovium and tendon, left upper arm: Secondary | ICD-10-CM | POA: Insufficient documentation

## 2014-11-07 DIAGNOSIS — M25512 Pain in left shoulder: Secondary | ICD-10-CM | POA: Insufficient documentation

## 2014-11-07 NOTE — Therapy (Signed)
[place], [place], [address] Phone: [phone]   Fax:  [phone]  Physical Therapy Evaluation  Patient Details  Name: Sheila Fernandez MRN: [id number] Date of Birth: November 14, 1950 Referring Provider:  Coral Spikes, DO  Encounter Date: 11/07/2014      PT End of Session - 11/07/14 1328    Visit Number 1   Number of Visits 16   Date for PT Re-Evaluation 01/02/15   PT Start Time 6754   PT Stop Time 1240   PT Time Calculation (min) 45 min   Activity Tolerance Patient tolerated treatment well      Past Medical History  Diagnosis Date  . Adenomyosis   . Uterine fibroid   . Hypertension   . Iritis, recurrent   . Arthritis     Past Surgical History  Procedure Laterality Date  . Cesarean section    . Cholecystectomy    . Breast lumpectomy    . Foot surgery      There were no vitals taken for this visit.  Visit Diagnosis:  Biceps tendinopathy, left - Plan: PT plan of care cert/re-cert  Pain in joint, shoulder region, left - Plan: PT plan of care cert/re-cert      Subjective Assessment - 11/07/14 1203    Symptoms Fell at work on 08/08/15 injuring her left shoulder.  Left anterior and posterior shoulder pain extending down below elbow.  Constant, even at night.  Worsened with lifting.  Unable to sleep on left side.   Pertinent History chest pain; history of radiculopathy remotely patient unsure which side   Diagnostic tests x-ray negative for fracture   Patient Stated Goals decrease pain and use arm normally   Currently in Pain? Yes   Pain Location Shoulder   Pain Orientation Left   Pain Descriptors / Indicators Aching;Nagging   Pain Type Acute pain   Pain Onset More than a month ago   Pain Frequency Constant   Aggravating Factors  lifting, pushing like for mopping, sweeping   Pain Relieving Factors medication          [facility] PT Assessment - 11/07/14 1209    Assessment   Medical  Diagnosis biceps tendinopathy; shoulder pain   Onset Date 08/07/14   Next MD Visit --  after PT   Prior Therapy no   Precautions   Precautions None   Restrictions   Weight Bearing Restrictions No   Balance Screen   Has the patient fallen in the past 6 months Yes   How many times? 1   Has the patient had a decrease in activity level because of a fear of falling?  No   Is the patient reluctant to leave their home because of a fear of falling?  No   Home Environment   Living Enviornment Private residence   Available Help at Discharge Family   Prior Function   Level of 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; interactive   Vocation Requirements --  only missed a few days right after fall   Leisure --  read, puzzles   Observation/Other Assessments   Observations --  right hand dominant   Focus on Therapeutic Outcomes (FOTO)  --  not captured secondary to patient arriving late   AROM   Right Shoulder Flexion 155 Degrees   Right Shoulder ABduction 175 Degrees   Right Shoulder Internal Rotation --  behind  back to T8   Right Shoulder External Rotation 69 Degrees   Left Shoulder Flexion 143 Degrees   Left Shoulder ABduction 147 Degrees   Left Shoulder Internal Rotation --  behind back to L1 painful   Left Shoulder External Rotation 58 Degrees  painful   Cervical - Right Side Bend 15   Cervical - Left Side Bend 15   Cervical - Right Rotation 40   Cervical - Left Rotation 40   Strength   Overall Strength Comments --  Grip strength right 47#, left 16#   Left Shoulder Flexion 3+/5   Left Shoulder ABduction 3+/5   Left Shoulder Internal Rotation 3+/5   Left Shoulder External Rotation 3+/5   Palpation   Palpation --  Tender/spasm left upper trap   Special Tests    Special Tests Rotator Cuff Impingement;Cervical   Cervical Tests Spurling's;Dictraction;other   Rotator Cuff Impingment tests  Michel Bickers test;Full Can test;Drop Arm test;Painful Arc of Motion   Spurling's   Findings Negative   Distraction Test   Findngs Positive   other    Findings Positive  Left ULTT   Hawkins-Kennedy test   Findings Negative   Full Can test   Findings Negative   Drop Arm test   Findings Negative   Painful Arc of Motion   Findings Negative                          PT Education - 11/07/14 1327    Education provided Yes   Education Details pendulums; scapular retract; hand intrinsics, upper trap stretch   Person(s) Educated Patient   Methods Explanation;Demonstration;Handout   Comprehension Verbalized understanding;Returned demonstration          PT Short Term Goals - 11/07/14 1523    PT SHORT TERM GOAL #1   Title "Independent with initial HEP   Time 4   Period Weeks   Status New   PT SHORT TERM GOAL #2   Title Left shoulder flexion and abduction improved to 153 degrees needed for household chores.   Time 4   Period Weeks   Status New   PT SHORT TERM GOAL #3   Title Cervical sidebending improved to 25 degrees and cervical rotation to 45 degrees needed for home and work mobility   Time 4   Period Weeks   Status New   PT SHORT TERM GOAL #4   Title Overall pain and function improved by 25%   Time 4   Period Weeks   Status New           PT Long Term Goals - 11/07/14 1527    PT LONG TERM GOAL #1   Title "Pt will be independent with advanced HEP.    Time 8   Period Weeks   Status New   PT LONG TERM GOAL #2   Title The patient will have improved left UE strength in shoulder to 4-/5 needed for sweeping and mopping.   Time 8   Period Weeks   Status New   PT LONG TERM GOAL #3   Title Left grip strength improved to 26# needed for lifting household items, dishes   Time 8   Period Weeks   Status New   PT LONG TERM GOAL #4   Title Patient will report an overall improvement in pain/function by 50%   Time 8   Period Weeks   Status New  Plan - 11/07/14 1329    Clinical Impression Statement Patient arrives 12 min late.  The patient presents complaining of left shoulder (anterior, posterior), upper arm to forearm pain since she fell on a folding chair at work in early November.  She reports symptoms have worsened over time  and is painful with lifting.  She is unable to sleep on her left side and avoids sweeping or mopping with her left arm.  She states overhead reaching is not especially painful although ROM limited.  Past medical history is significant for radiculopathy (side unknown) in 2008 which did fully resolve.  Left shoulder AROM:  flex 143, abd 147, ER 58, IR behind back to L1.  IR and ER are especially painful.  Cervical rotation and sidebending very stiff and painful in left upper trap region.  Left shoulder strength grossly 3+/5.  Decreased grip strength 16# left (right 47#).   Negative drop arm, negative Hawkins-Kennedy, negative active compression test.  Relief with cervical distraction.  Postive left upper limb tension test.     Pt will benefit from skilled therapeutic intervention in order to improve on the following deficits Pain;Increased muscle spasms;Decreased strength;Decreased range of motion   Rehab Potential Good   PT Frequency 2x / week   PT Duration 8 weeks   PT Treatment/Interventions ADLs/Self Care Home Management;Cryotherapy;Electrical Stimulation;Moist Heat;Ultrasound;Therapeutic exercise;Patient/family education;Manual techniques;Neuromuscular re-education  ionto with dexamethasone 4 mg/ml   PT Next Visit Plan Next visit:  Quick DASH; Referred for left shoulder pain/biceps tendonopathy; some signs of radiculopathy including decreased cervical AROM, +ULTT, decreased pain with cervical distraction, grip weakness; manual therapy including cervical, scapular and GH joint mobs; supine cane exercises; heat or cold/e-stim for pain control          Problem List Patient Active Problem List    Diagnosis Date Noted  . Biceps tendinopathy 08/15/2014  . Nausea alone 09/06/2013  . History of smoking 09/06/2013  . Chest pain 09/05/2013  . Dizziness - light-headed 04/29/2012  . Arthritis   . Chest pain 02/03/2011  . Iritis, recurrent 01/16/2011  . PLANTAR FASCIITIS, BILATERAL 08/15/2010  . COUGH, CHRONIC 08/15/2010  . FIBROIDS, UTERUS 12/21/2009  . ASCUS PAP 10/05/2009  . CERVICAL RADICULOPATHY 07/21/2007  . SICKLE CELL TRAIT 11/26/2006  . HYPERTENSION, BENIGN SYSTEMIC 11/26/2006    [name] 11/07/2014, 3:46 PM  [facility] [address] [place], [place], [address] Phone: [phone]   Fax:  [phone]   Ruben Im, [place] 11/07/2014 3:47 PM Phone: [phone] Fax: [phone]

## 2014-11-07 NOTE — Patient Instructions (Signed)
ROM: Pendulum (Circular)  Let right arm move in circle clockwise, then counterclockwise, by rocking body weight in circular pattern. Circle _10___ times each direction per set. Do _3___ sessions per day.  Pendulum Side to Side  Bend forward 90 at waist, leaning on table for support. Rock body from side to side and let arm swing freely. Repeat _10___ times. Do __3__ sessions per day.  Finger Flexors  Keeping right fingertips straight, press putty toward base of palm. Repeat __20__ times. Do __3__ sessions per day. Activity: Squeeze flour sifter, plastic squeeze bottles, [place] baster, juice from fruit.*  AROM: Elbow Flexion / Extension  With left hand palm up, gently bend elbow as far as possible. Then straighten arm as far as possible. Repeat __10__ times per set. Do __3__ sessions per day.  SHOULDER: Flexion On Table  Place hands on table, elbows straight. Move hips away from body. Press hands down into table. Hold _3__ seconds. _10__ reps per set, __3_ sets per day. Posture - Sitting   Sit upright, head facing forward. Try using a roll to support lower back. Keep shoulders relaxed, and avoid rounded back. Keep hips level with knees. Avoid crossing legs for long periods.  Copyright  VHI. All rights reserved.

## 2014-11-09 ENCOUNTER — Encounter: Payer: Self-pay | Admitting: Rehabilitation

## 2014-11-13 ENCOUNTER — Ambulatory Visit: Payer: 59 | Admitting: Physical Therapy

## 2014-11-15 ENCOUNTER — Ambulatory Visit: Payer: 59 | Admitting: Rehabilitation

## 2014-11-15 DIAGNOSIS — M67922 Unspecified disorder of synovium and tendon, left upper arm: Secondary | ICD-10-CM

## 2014-11-15 DIAGNOSIS — M25512 Pain in left shoulder: Secondary | ICD-10-CM

## 2014-11-15 NOTE — Therapy (Signed)
[place], [place], [address] Phone: [phone]   Fax:  [phone]  Physical Therapy Treatment  Patient Details  Name: Sheila Fernandez MRN: [id number] Date of Birth: 1950/12/07 Referring Provider:  Leeanne Rio, MD  Encounter Date: 11/15/2014      PT End of Session - 11/15/14 1157    Visit Number 2   Number of Visits 16   Date for PT Re-Evaluation 01/02/15   PT Start Time 4585   PT Stop Time 9292   PT Time Calculation (min) 52 min      Past Medical History  Diagnosis Date  . Adenomyosis   . Uterine fibroid   . Hypertension   . Iritis, recurrent   . Arthritis     Past Surgical History  Procedure Laterality Date  . Cesarean section    . Cholecystectomy    . Breast lumpectomy    . Foot surgery      There were no vitals taken for this visit.  Visit Diagnosis:  Pain in joint, shoulder region, left  Biceps tendinopathy, left      Subjective Assessment - 11/15/14 1155    Symptoms Its about the same   Currently in Pain? Yes   Pain Score 7    Pain Location Shoulder   Pain Orientation Left   Pain Descriptors / Indicators Aching;Constant   Pain Type Acute pain   Pain Frequency Constant   Aggravating Factors  lifting, pushing while mopping/sweeping.    Pain Relieving Factors ice heat meds                    [facility] Adult PT Treatment/Exercise - 11/15/14 1206    Shoulder Exercises: Supine   Other Supine Exercises Supine cane exercises with cues for technique   Shoulder Exercises: Seated   Retraction Both;10 reps  cues for scapular depression   Modalities   Modalities Electrical Stimulation;Moist Heat   Moist Heat Therapy   Number Minutes Moist Heat 15 Minutes   Moist Heat Location Shoulder   Electrical Stimulation   Electrical Stimulation Location left shoulder   Electrical Stimulation Action IFC   Electrical Stimulation Parameters 12   Electrical  Stimulation Goals Pain   Manual Therapy   Manual Therapy Joint mobilization   Joint Mobilization gh jt mobs grade 2 with oscillations and distractions inferior and posterior glides also scapular mobs  pt requires cues to decrease guarding.    Neck Exercises: Stretches   Upper Trapezius Stretch 3 reps;10 seconds   Levator Stretch 3 reps;10 seconds                PT Education - 11/15/14 1211    Education provided Yes   Education Details Supine [employer]) Educated Patient   Methods Explanation;Handout   Comprehension Verbalized understanding          PT Short Term Goals - 11/07/14 1523    PT SHORT TERM GOAL #1   Title "Independent with initial HEP   Time 4   Period Weeks   Status New   PT SHORT TERM GOAL #2   Title Left shoulder flexion and abduction improved to 153 degrees needed for household chores.   Time 4   Period Weeks   Status New   PT SHORT TERM GOAL #3   Title Cervical sidebending improved to 25 degrees and cervical rotation to 45 degrees needed for home and work mobility   Time 4  Period Weeks   Status New   PT SHORT TERM GOAL #4   Title Overall pain and function improved by 25%   Time 4   Period Weeks   Status New           PT Long Term Goals - 11/07/14 1527    PT LONG TERM GOAL #1   Title "Pt will be independent with advanced HEP.    Time 8   Period Weeks   Status New   PT LONG TERM GOAL #2   Title The patient will have improved left UE strength in shoulder to 4-/5 needed for sweeping and mopping.   Time 8   Period Weeks   Status New   PT LONG TERM GOAL #3   Title Left grip strength improved to 26# needed for lifting household items, dishes   Time 8   Period Weeks   Status New   PT LONG TERM GOAL #4   Title Patient will report an overall improvement in pain/function by 50%   Time 8   Period Weeks   Status New               Plan - 11/15/14 1302    Clinical Impression Statement Pt tolerated supine exercises with  mild increase in pain. She is guarded with passive motion and jt mobs, she reports maybe some benefit after E-stim today   PT Next Visit Plan Next visit:  Quick DASH; assess benefit of estim; Begin IONTO ;Referred for left shoulder pain/biceps tendonopathy; some signs of radiculopathy including decreased cervical AROM, +ULTT, decreased pain with cervical distraction, grip weakness; manual therapy including cervical, scapular and GH joint mobs; supine cane exercises; heat or cold/e-stim for pain control         Problem List Patient Active Problem List   Diagnosis Date Noted  . Biceps tendinopathy 08/15/2014  . Nausea alone 09/06/2013  . History of smoking 09/06/2013  . Chest pain 09/05/2013  . Dizziness - light-headed 04/29/2012  . Arthritis   . Chest pain 02/03/2011  . Iritis, recurrent 01/16/2011  . PLANTAR FASCIITIS, BILATERAL 08/15/2010  . COUGH, CHRONIC 08/15/2010  . FIBROIDS, UTERUS 12/21/2009  . ASCUS PAP 10/05/2009  . CERVICAL RADICULOPATHY 07/21/2007  . SICKLE CELL TRAIT 11/26/2006  . HYPERTENSION, BENIGN SYSTEMIC 11/26/2006    Dorene Ar, PTA 11/15/2014, 1:05 PM  [facility] [address] [place], [place], [address] Phone: [phone]   Fax:  [phone]

## 2014-11-15 NOTE — Patient Instructions (Signed)
SHOULDER: External Rotation - Supine (Cane)   Hold cane with both hands. Rotate arm away from body. Keep elbow on floor and next to body. _10__ reps per set, _2-3__ sets per dayCopyright  VHI. All rights reserved.  Cane Horizontal - Supine   With straight arms holding cane above shoulders, bring cane out to right, center, out to left, and back to above head. Repeat _10__ times. Do _2-3__ times per day.  Copyright  VHI. All rights reserved.  Cane Exercise: Flexion  UP x 10 then over x 10 Lie on back, holding cane above chest. Keeping arms as straight as possible, lower cane toward floor beyond head. Hold _5___ seconds. Repeat _10___ times. Do __2-3__ sessions per day.  http://[email or web address].exer.us/91   Copyright  VHI. All rights reserved.

## 2014-11-21 ENCOUNTER — Ambulatory Visit: Payer: 59 | Admitting: Rehabilitation

## 2014-11-21 DIAGNOSIS — M67922 Unspecified disorder of synovium and tendon, left upper arm: Secondary | ICD-10-CM

## 2014-11-21 DIAGNOSIS — M25512 Pain in left shoulder: Secondary | ICD-10-CM

## 2014-11-21 NOTE — Patient Instructions (Signed)

## 2014-11-21 NOTE — Therapy (Signed)
[place], [place], [address] Phone: [phone]   Fax:  [phone]  Physical Therapy Treatment  Patient Details  Name: Sheila Fernandez MRN: [id number] Date of Birth: [birth year] Referring Provider:  Leeanne Rio, MD  Encounter Date: 11/21/2014      PT End of Session - 11/21/14 0937    Visit Number 3   Number of Visits 16   Date for PT Re-Evaluation 01/02/15   PT Start Time 0930   PT Stop Time 1015   PT Time Calculation (min) 45 min      Past Medical History  Diagnosis Date  . Adenomyosis   . Uterine fibroid   . Hypertension   . Iritis, recurrent   . Arthritis     Past Surgical History  Procedure Laterality Date  . Cesarean section    . Cholecystectomy    . Breast lumpectomy    . Foot surgery      There were no vitals taken for this visit.  Visit Diagnosis:  Pain in joint, shoulder region, left  Biceps tendinopathy, left      Subjective Assessment - 11/21/14 0935    Symptoms No noticable change in pain.    Currently in Pain? Yes   Pain Score 7    Pain Location Shoulder   Pain Orientation Left   Pain Descriptors / Indicators Aching   Pain Type Acute pain   Pain Onset More than a month ago   Pain Frequency Constant   Aggravating Factors  certain movements   Pain Relieving Factors ice meds.           [facility] PT Assessment - 11/21/14 0947    AROM   Left Shoulder Flexion 147 Degrees   Left Shoulder ABduction 125 Degrees   Left Shoulder Internal Rotation --  reach to T-10   Left Shoulder External Rotation --  reach to T-3   Cervical - Right Side Bend 25   Cervical - Left Side Bend 25   Cervical - Right Rotation 65   Cervical - Left Rotation 55                  [facility] Adult PT Treatment/Exercise - 11/21/14 0943    Shoulder Exercises: Pulleys   Flexion 2 minutes   Other Pulley Exercises scaption 1 minute   Modalities   Modalities  Iontophoresis;Ultrasound   Ultrasound   Ultrasound Location Left upper trap, middle trap, posterior shoulder    Ultrasound Parameters 100% 1.6 w/cm2 1 MHZ x 8  min   Ultrasound Goals Pain   Iontophoresis   Type of Iontophoresis Dexamethasone   Location Left posterior shoulder at RTC insertion    Dose 1.0 cc   Time 6 hours   Neck Exercises: Stretches   Upper Trapezius Stretch 3 reps;10 seconds  with manual scap depression from PTA, seated   Levator Stretch 3 reps;10 seconds  with manual scap depression from PTA, seated                PT Education - 11/21/14 1103    Education provided Yes   Education Details Iontophoresis precautions   Person(s) Educated Patient   Methods Explanation   Comprehension Verbalized understanding          PT Short Term Goals - 11/21/14 0938    PT SHORT TERM GOAL #1   Title "Independent with initial HEP   Time 4   Period Weeks   Status Achieved  PT SHORT TERM GOAL #2   Title Left shoulder flexion and abduction improved to 153 degrees needed for household chores.   Time 4   Period Weeks   PT SHORT TERM GOAL #3   Title Cervical sidebending improved to 25 degrees and cervical rotation to 45 degrees needed for home and work mobility   Time 4   Period Weeks   PT SHORT TERM GOAL #4   Title Overall pain and function improved by 25%   Time 4   Period Weeks   Status On-going           PT Long Term Goals - 11/21/14 6720    PT LONG TERM GOAL #1   Title "Pt will be independent with advanced HEP.    Time 8   Period Weeks   Status On-going   PT LONG TERM GOAL #2   Title The patient will have improved left UE strength in shoulder to 4-/5 needed for sweeping and mopping.   Time 8   Period Weeks   Status On-going   PT LONG TERM GOAL #3   Title Left grip strength improved to 26# needed for lifting household items, dishes   Time 8   Period Weeks   Status On-going   PT LONG TERM GOAL #4   Title Patient will report an overall  improvement in pain/function by 50%   Time 8   Period Weeks   Status On-going               Plan - 11/21/14 1105    Clinical Impression Statement Pt reports short term relief after E-stim last visit and no noticable improvement in shoulder pain with current HEP. Her neck AROM and shoulder AROM are improving. Trial of ultrasound and iontophoresis for pain relief.    PT Next Visit Plan Next visit:  Quick DASH; assess benefit of ultraound; continue IONTO ;Referred for left shoulder pain/biceps tendonopathy; some signs of radiculopathy including decreased cervical AROM, +ULTT, decreased pain with cervical distraction, grip weakness; manual therapy including cervical, scapular and GH joint mobs; supine cane exercises; heat or cold/e-stim for pain control         Problem List Patient Active Problem List   Diagnosis Date Noted  . Biceps tendinopathy 08/15/2014  . Nausea alone 09/06/2013  . History of smoking 09/06/2013  . Chest pain 09/05/2013  . Dizziness - light-headed 04/29/2012  . Arthritis   . Chest pain 02/03/2011  . Iritis, recurrent 01/16/2011  . PLANTAR FASCIITIS, BILATERAL 08/15/2010  . COUGH, CHRONIC 08/15/2010  . FIBROIDS, UTERUS 12/21/2009  . ASCUS PAP 10/05/2009  . CERVICAL RADICULOPATHY 07/21/2007  . SICKLE CELL TRAIT 11/26/2006  . HYPERTENSION, BENIGN SYSTEMIC 11/26/2006    Dorene Ar, PTA 11/21/2014, 12:03 PM  [facility] [address] [place], [place], [address] Phone: [phone]   Fax:  [phone]

## 2014-11-23 ENCOUNTER — Ambulatory Visit: Payer: 59 | Admitting: Physical Therapy

## 2014-11-23 DIAGNOSIS — M25512 Pain in left shoulder: Secondary | ICD-10-CM

## 2014-11-23 DIAGNOSIS — M67922 Unspecified disorder of synovium and tendon, left upper arm: Secondary | ICD-10-CM

## 2014-11-23 NOTE — Therapy (Signed)
[place], [place], [address] Phone: [phone]   Fax:  [phone]  Physical Therapy Treatment  Patient Details  Name: Sheila Fernandez MRN: [id number] Date of Birth: December 24, 1950 Referring Provider:  Leeanne Rio, MD  Encounter Date: 11/23/2014      PT End of Session - 11/23/14 1847    Visit Number 4   Number of Visits 16   Date for PT Re-Evaluation 01/02/15   PT Start Time 0930   PT Stop Time 1030   PT Time Calculation (min) 60 min   Activity Tolerance Patient tolerated treatment well      Past Medical History  Diagnosis Date  . Adenomyosis   . Uterine fibroid   . Hypertension   . Iritis, recurrent   . Arthritis     Past Surgical History  Procedure Laterality Date  . Cesarean section    . Cholecystectomy    . Breast lumpectomy    . Foot surgery      There were no vitals taken for this visit.  Visit Diagnosis:  Pain in joint, shoulder region, left  Biceps tendinopathy, left      Subjective Assessment - 11/23/14 0934    Symptoms A little better.  Left upper trap region pain and levator scap region, some mild symptoms down left arm.     Currently in Pain? Yes   Pain Score 6    Pain Location Shoulder   Pain Orientation Left   Pain Onset More than a month ago   Pain Frequency Constant   Aggravating Factors  lifting something with left arm; playing with granddaughter; moving tables when cleaning up after work          [facility] PT Assessment - 11/23/14 1839    Observation/Other Assessments   Quick DASH  34.1                  [facility] Adult PT Treatment/Exercise - 11/23/14 1840    Exercises   Exercises Shoulder   Shoulder Exercises: Prone   Retraction Left;10 reps   Extension Left;10 reps   Horizontal ABduction 1 Left;10 reps   Modalities   Modalities Electrical Stimulation;Moist Heat   Moist Heat Therapy   Number Minutes Moist Heat 15 Minutes   Moist Heat  Location Shoulder   Electrical Stimulation   Electrical Stimulation Location left shoulder   Electrical Stimulation Action IFC   Electrical Stimulation Parameters 6   Electrical Stimulation Goals Pain   Manual Therapy   Manual Therapy Joint mobilization   Joint Mobilization C3-C4 left lateral glides;GH distraction, inferior, posterior grade 3 5x each; Left UE neural glides 15x   Myofascial Release left upper trap and levator scapulae   Scapular Mobilization scapular distraction, medial/lateral sup/inferior grade 3   Manual Traction cervical distraction 5x 20 sec hold          Trigger Point Dry Needling - 11/23/14 1845    Consent Given? Yes   Education Handout Provided Yes  including risk of pneumothorax   Muscles Treated Upper Body Upper trapezius;Levator scapulae   Upper Trapezius Response Twitch reponse elicited   Levator Scapulae Response Palpable increased muscle length              PT Education - 11/23/14 1846    Education provided Yes   Education Details dry needling info; prone HABD   Person(s) Educated Patient   Methods Demonstration;Handout   Comprehension Verbalized understanding;Returned demonstration  PT Short Term Goals - 11/23/14 1853    PT SHORT TERM GOAL #1   Title "Independent with initial HEP   Status Achieved   PT SHORT TERM GOAL #2   Title Left shoulder flexion and abduction improved to 153 degrees needed for household chores.   Time 4   Period Weeks   Status On-going   PT SHORT TERM GOAL #3   Title Cervical sidebending improved to 25 degrees and cervical rotation to 45 degrees needed for home and work mobility   Time 4   Period Weeks   Status On-going   PT SHORT TERM GOAL #4   Title Overall pain and function improved by 25%   Time 4   Period Weeks   Status On-going           PT Long Term Goals - 11/23/14 1854    PT LONG TERM GOAL #1   Title "Pt will be independent with advanced HEP.    Time 8   Period Weeks    Status On-going   PT LONG TERM GOAL #2   Title The patient will have improved left UE strength in shoulder to 4-/5 needed for sweeping and mopping.   Time 8   Period Weeks   Status On-going   PT LONG TERM GOAL #3   Title Left grip strength improved to 26# needed for lifting household items, dishes   Time 8   Period Weeks   Status On-going   PT LONG TERM GOAL #4   Title Patient will report an overall improvement in pain/function by 50%   Time 8   Period Weeks   Status On-going               Plan - 11/23/14 1848    Clinical Impression Statement Signs and symptoms continue to suggest possible referred pain from cervical region.  +upper limb tension test, relief from cervical distraction.  Palpable trigger points in left upper trap and levator muscles.    Patient moderate difficulty with ADLs per quick DASH.  Continue with treatment plan to include cervical interventions as well.     PT Next Visit Plan assess response to dry needling and cervical manual interventions; recheck shoulder AROM and cervical AROM; resume ionto if helpful; prone shoulder extension, HABD, prone rows; modalities PRN        Problem List Patient Active Problem List   Diagnosis Date Noted  . Biceps tendinopathy 08/15/2014  . Nausea alone 09/06/2013  . History of smoking 09/06/2013  . Chest pain 09/05/2013  . Dizziness - light-headed 04/29/2012  . Arthritis   . Chest pain 02/03/2011  . Iritis, recurrent 01/16/2011  . PLANTAR FASCIITIS, BILATERAL 08/15/2010  . COUGH, CHRONIC 08/15/2010  . FIBROIDS, UTERUS 12/21/2009  . ASCUS PAP 10/05/2009  . CERVICAL RADICULOPATHY 07/21/2007  . SICKLE CELL TRAIT 11/26/2006  . HYPERTENSION, BENIGN SYSTEMIC 11/26/2006    [name] 11/23/2014, 6:55 PM  [place], [place], [address] Phone: [phone]   Fax:  [phone]  Ruben Im, PT 11/23/2014 6:56 PM Phone:  [phone] Fax: [phone]

## 2014-11-23 NOTE — Patient Instructions (Signed)
s per session. Do ____ sessions per day.  Scapular Retraction: Abduction / Extension (Prone)  Lie with arms out from sides 90. Pinch shoulder blades together and raise arms a few inches from floor. Repeat ____ times per set. Do ____ sets per session. Do ____ sessions per day.  Trigger Point Dry Needling  . What is Trigger Point Dry Needling (DN)? o DN is a physical therapy technique used to treat muscle pain and dysfunction. Specifically, DN helps deactivate muscle trigger points (muscle knots).  o A thin filiform needle is used to penetrate the skin and stimulate the underlying trigger point. The goal is for a local twitch response (LTR) to occur and for the trigger point to relax. No medication of any kind is injected during the procedure.   . What Does Trigger Point Dry Needling Feel Like?  o The procedure feels different for each individual patient. Some patients report that they do not actually feel the needle enter the skin and overall the process is not painful. Very mild bleeding may occur. However, many patients feel a deep cramping in the muscle in which the needle was inserted. This is the local twitch response.   [name] How Will I feel after the treatment? o Soreness is normal, and the onset of soreness may not occur for a few hours. Typically this soreness does not last longer than two days.  o Bruising is uncommon, however; ice can be used to decrease any possible bruising.  o In rare cases feeling tired or nauseous after the treatment is normal. In addition, your symptoms may get worse before they get better, this period will typically not last longer than 24 hours.   . What Can I do After My Treatment? o Increase your hydration by drinking more water for the next 24 hours. o You may place ice or heat on the areas treated that have become sore, however, do not use heat on inflamed or bruised areas. Heat often brings more relief post needling. o You can continue your regular  activities, but vigorous activity is not recommended initially after the treatment for 24 hours. o DN is best combined with other physical therapy such as strengthening, stretching, and other therapies.

## 2014-11-25 ENCOUNTER — Other Ambulatory Visit: Payer: Self-pay

## 2014-11-28 ENCOUNTER — Ambulatory Visit: Payer: 59 | Attending: Family Medicine | Admitting: Rehabilitation

## 2014-11-28 DIAGNOSIS — M67922 Unspecified disorder of synovium and tendon, left upper arm: Secondary | ICD-10-CM

## 2014-11-28 DIAGNOSIS — M25512 Pain in left shoulder: Secondary | ICD-10-CM

## 2014-11-28 NOTE — Therapy (Signed)
[place], [place], [address] Phone: [phone]   Fax:  [phone]  Physical Therapy Treatment  Patient Details  Name: Sheila Fernandez MRN: [id number] Date of Birth: 12-05-1950 Referring Provider:  Leeanne Rio, MD  Encounter Date: 11/28/2014      PT End of Session - 11/28/14 1017    Visit Number 5   Number of Visits 16   Date for PT Re-Evaluation 01/02/15   PT Start Time 0930   PT Stop Time 1015   PT Time Calculation (min) 45 min      Past Medical History  Diagnosis Date  . Adenomyosis   . Uterine fibroid   . Hypertension   . Iritis, recurrent   . Arthritis     Past Surgical History  Procedure Laterality Date  . Cesarean section    . Cholecystectomy    . Breast lumpectomy    . Foot surgery      There were no vitals taken for this visit.  Visit Diagnosis:  Biceps tendinopathy, left  Pain in joint, shoulder region, left      Subjective Assessment - 11/28/14 0945    Symptoms Pt reports less pain in upper traps and levator area however continued 5/10 pain in left shoulder and lateral upper arm.    Pain Score 5    Pain Location Shoulder   Pain Orientation Left   Pain Descriptors / Indicators Aching   Pain Type Acute pain   Pain Frequency Constant   Aggravating Factors  lifting with left arm   Pain Relieving Factors ice meds          [facility] PT Assessment - 11/28/14 0939    AROM   Right Shoulder Flexion 150 Degrees   Right Shoulder ABduction 165 Degrees   Left Shoulder Flexion 150 Degrees   Left Shoulder ABduction 165 Degrees   Cervical - Right Side Bend 30   Cervical - Left Side Bend 30   Cervical - Right Rotation 55   Cervical - Left Rotation 55                  [facility] Adult PT Treatment/Exercise - 11/28/14 0953    Shoulder Exercises: Prone   Retraction 15 reps   Extension Both;10 reps  1 set with scap retract   Horizontal ABduction 1 15 reps   Other Prone Exercises Prone Bent T x 15   Iontophoresis   Type of Iontophoresis Dexamethasone   Location Left lateral shoulder   Dose 1.0 cc   Time 6 hours   Neck Exercises: Stretches   Upper Trapezius Stretch 3 reps;30 seconds  mirror feedback   Levator Stretch 3 reps;30 seconds  mirror feedback                PT Education - 11/28/14 1017    Education provided Yes   Education Details Use mirror feedback to ensure proper technique with stretches, continue prone horiz abdct   Person(s) Educated Patient   Methods Explanation   Comprehension Verbalized understanding          PT Short Term Goals - 11/23/14 1853    PT SHORT TERM GOAL #1   Title "Independent with initial HEP   Status Achieved   PT SHORT TERM GOAL #2   Title Left shoulder flexion and abduction improved to 153 degrees needed for household chores.   Time 4   Period Weeks   Status On-going   PT SHORT TERM  GOAL #3   Title Cervical sidebending improved to 25 degrees and cervical rotation to 45 degrees needed for home and work mobility   Time 4   Period Weeks   Status On-going   PT SHORT TERM GOAL #4   Title Overall pain and function improved by 25%   Time 4   Period Weeks   Status On-going           PT Long Term Goals - 11/23/14 1854    PT LONG TERM GOAL #1   Title "Pt will be independent with advanced HEP.    Time 8   Period Weeks   Status On-going   PT LONG TERM GOAL #2   Title The patient will have improved left UE strength in shoulder to 4-/5 needed for sweeping and mopping.   Time 8   Period Weeks   Status On-going   PT LONG TERM GOAL #3   Title Left grip strength improved to 26# needed for lifting household items, dishes   Time 8   Period Weeks   Status On-going   PT LONG TERM GOAL #4   Title Patient will report an overall improvement in pain/function by 50%   Time 8   Period Weeks   Status On-going               Plan - 11/28/14 0955    Clinical Impression Statement  Pt reports Ionto was helpful briefly, She also reports significant decrease in upper trap/levator pain after dry needling. Able to reinforce HEP for prone scap strength. Pt admits she did not try the exercise given last visit. Pt requires max verbal and tactile cues to perform upper trap and levator stretches correctly. Improved with mirror feedback. c    PT Next Visit Plan continue prone scap strength, cervical/ shoulder AROM, try chin tucks, check goals        Problem List Patient Active Problem List   Diagnosis Date Noted  . Biceps tendinopathy 08/15/2014  . Nausea alone 09/06/2013  . History of smoking 09/06/2013  . Chest pain 09/05/2013  . Dizziness - light-headed 04/29/2012  . Arthritis   . Chest pain 02/03/2011  . Iritis, recurrent 01/16/2011  . PLANTAR FASCIITIS, BILATERAL 08/15/2010  . COUGH, CHRONIC 08/15/2010  . FIBROIDS, UTERUS 12/21/2009  . ASCUS PAP 10/05/2009  . CERVICAL RADICULOPATHY 07/21/2007  . SICKLE CELL TRAIT 11/26/2006  . HYPERTENSION, BENIGN SYSTEMIC 11/26/2006    Dorene Ar, PTA 11/28/2014, 10:26 AM  [place], [place], [address] Phone: [phone]   Fax:  [phone]

## 2014-11-30 ENCOUNTER — Ambulatory Visit: Payer: 59 | Admitting: Physical Therapy

## 2014-11-30 DIAGNOSIS — M67922 Unspecified disorder of synovium and tendon, left upper arm: Secondary | ICD-10-CM | POA: Diagnosis not present

## 2014-11-30 DIAGNOSIS — M25512 Pain in left shoulder: Secondary | ICD-10-CM

## 2014-11-30 NOTE — Patient Instructions (Signed)
Remove patch carefully in 6 hours.  If too irritating remove earlier.

## 2014-11-30 NOTE — Therapy (Signed)
[place], [place], [address] Phone: [phone]   Fax:  [phone]  Physical Therapy Treatment  Patient Details  Name: Sheila Fernandez MRN: [id number] Date of Birth: [birth year] Referring Provider:  Leeanne Rio, MD  Encounter Date: 11/30/2014      PT End of Session - 11/30/14 1356    Visit Number 6   Number of Visits 16   Date for PT Re-Evaluation 01/02/15   PT Start Time 0935   PT Stop Time 1018   PT Time Calculation (min) 43 min   Activity Tolerance Patient tolerated treatment well;Patient limited by pain      Past Medical History  Diagnosis Date  . Adenomyosis   . Uterine fibroid   . Hypertension   . Iritis, recurrent   . Arthritis     Past Surgical History  Procedure Laterality Date  . Cesarean section    . Cholecystectomy    . Breast lumpectomy    . Foot surgery      There were no vitals taken for this visit.  Visit Diagnosis:  Pain in joint, shoulder region, left      Subjective Assessment - 11/30/14 1349    Currently in Pain? Yes                    [facility] Adult PT Treatment/Exercise - 11/30/14 0940    Shoulder Exercises: Supine   Other Supine Exercises --  grip LT 22,19,18 LBS,    Shoulder Exercises: Prone   Flexion Limitations 10 reps each, cues  122 degrees active shoulder flexion   Extension Both;10 reps   External Rotation Limitations 10 reps, each   Horizontal ABduction 1 10 reps   Iontophoresis   Type of Iontophoresis Dexamethasone   Location Lt lateral shoulder, posterior   Dose 1cc   Time 6 hours   Manual Therapy   Manual Therapy Myofascial release  Lt upper trap myofascial release and lengthening with strumm   Myofascial Release Lt upper trap, neck scapular area    Neck Exercises: Stretches   Upper Trapezius Stretch 3 reps;30 seconds   Levator Stretch 3 reps;30 seconds   Corner Stretch 5 reps;10 seconds   Other Neck Stretches --   AROM neck Rotation Rt 69, Lt 29, Lateral Flex RT35, LT 36                PT Education - 11/30/14 1355    Education provided Yes   Education Details sitting posture at work    [employer]) Educated Patient   Methods Explanation;Demonstration;Handout   Comprehension Verbalized understanding;Returned demonstration          PT Short Term Goals - 11/30/14 1358    PT SHORT TERM GOAL #2   Baseline 122 vdegrees   Time 4   Period Weeks   Status On-going   PT SHORT TERM GOAL #3   Baseline Rt 69, Lt 36 degrees side bend, Rotation Rt 69, Lt 29   Time 4   Status Partially Met   PT SHORT TERM GOAL #4   Status On-going           PT Long Term Goals - 11/30/14 1400    PT LONG TERM GOAL #1   Status On-going   PT LONG TERM GOAL #2   Status On-going   PT LONG TERM GOAL #3   Baseline 19.6 average Lt   Time 8   Period Weeks   Status On-going  PT LONG TERM GOAL #4   Title Patient will report an overall improvement in pain/function by 50%   Status On-going               Plan - 11/30/14 1357    Clinical Impression Statement pain improving, function improving, it is hard for patient to describe the specifics.  More compliant with home exercise.   PT Next Visit Plan continue prone scap strength, cervical/ shoulder AROM, try chin tucks,    Consulted and Agree with Plan of Care Patient        Problem List Patient Active Problem List   Diagnosis Date Noted  . Biceps tendinopathy 08/15/2014  . Nausea alone 09/06/2013  . History of smoking 09/06/2013  . Chest pain 09/05/2013  . Dizziness - light-headed 04/29/2012  . Arthritis   . Chest pain 02/03/2011  . Iritis, recurrent 01/16/2011  . PLANTAR FASCIITIS, BILATERAL 08/15/2010  . COUGH, CHRONIC 08/15/2010  . FIBROIDS, UTERUS 12/21/2009  . ASCUS PAP 10/05/2009  . CERVICAL RADICULOPATHY 07/21/2007  . SICKLE CELL TRAIT 11/26/2006  . HYPERTENSION, BENIGN SYSTEMIC 11/26/2006   Melvenia Needles, PTA 11/30/2014 2:13  PM Phone: [phone] Fax: [phone]   Melvenia Needles 11/30/2014, 2:13 PM  [facility] [address] [place], [place], [address] Phone: [phone]   Fax:  [phone]

## 2014-12-18 ENCOUNTER — Ambulatory Visit: Payer: 59 | Attending: Family Medicine | Admitting: Physical Therapy

## 2014-12-18 DIAGNOSIS — M25512 Pain in left shoulder: Secondary | ICD-10-CM | POA: Insufficient documentation

## 2014-12-18 DIAGNOSIS — M67922 Unspecified disorder of synovium and tendon, left upper arm: Secondary | ICD-10-CM | POA: Diagnosis not present

## 2014-12-18 NOTE — Patient Instructions (Signed)
   Kristoffer Leamon PT, DPT, LAT, ATC  [facility] Phone: [phone]     

## 2014-12-18 NOTE — Therapy (Signed)
[place], [place], [address] Phone: [phone]   Fax:  [phone]  Physical Therapy Treatment  Patient Details  Name: Sheila Fernandez MRN: [id number] Date of Birth: 01/31/1951 Referring Provider:  Coral Spikes, DO  Encounter Date: 12/18/2014      PT End of Session - 12/18/14 1429    Visit Number 7   Number of Visits 16   Date for PT Re-Evaluation 01/02/15   PT Start Time 0930   PT Stop Time 1019   PT Time Calculation (min) 49 min   Activity Tolerance Patient tolerated treatment well   Behavior During Therapy [facility] for tasks assessed/performed      Past Medical History  Diagnosis Date  . Adenomyosis   . Uterine fibroid   . Hypertension   . Iritis, recurrent   . Arthritis     Past Surgical History  Procedure Laterality Date  . Cesarean section    . Cholecystectomy    . Breast lumpectomy    . Foot surgery      There were no vitals filed for this visit.  Visit Diagnosis:  Pain in joint, shoulder region, left  Biceps tendinopathy, left      Subjective Assessment - 12/18/14 0938    Symptoms she reports that her symptoms have remained the same since the last visit.  She reports she didn't know she had to make more visits and thats why it has been 3 weeks since she has been in.    Currently in Pain? Yes   Pain Score 6    Pain Location Shoulder   Pain Orientation Left   Pain Descriptors / Indicators Aching;Dull   Pain Type Acute pain   Pain Onset More than a month ago   Pain Frequency Constant                       [facility] Adult PT Treatment/Exercise - 12/18/14 0941    Shoulder Exercises: Supine   Protraction AROM;Strengthening;Left;15 reps  ceiling punches 2#   Shoulder Exercises: Seated   Retraction AROM;Strengthening;Both;15 reps   with shoulder ER with red theraband   Shoulder Exercises: Prone   Other Prone Exercises I's T's and Y's x 15 ea  1#   Shoulder  Exercises: ROM/Strengthening   UBE (Upper Arm Bike) Nu step L3 x 6 min  pushing with both arms and legs.   Manual Therapy   Manual Therapy Myofascial release   Myofascial Release Lt upper trap, neck scapular area   and trigger point release of upper trap   Neck Exercises: Stretches   Upper Trapezius Stretch 2 reps;30 seconds   Levator Stretch 2 reps;30 seconds   Corner Stretch 2 reps;30 seconds                PT Education - 12/18/14 1424    Education provided Yes   Education Details HEP   Person(s) Educated Patient   Methods Explanation   Comprehension Verbalized understanding          PT Short Term Goals - 11/30/14 1358    PT SHORT TERM GOAL #2   Baseline 122 vdegrees   Time 4   Period Weeks   Status On-going   PT SHORT TERM GOAL #3   Baseline Rt 69, Lt 36 degrees side bend, Rotation Rt 69, Lt 29   Time 4   Status Partially Met   PT SHORT TERM GOAL #4   Status On-going  PT Long Term Goals - 11/30/14 1400    PT LONG TERM GOAL #1   Status On-going   PT LONG TERM GOAL #2   Status On-going   PT LONG TERM GOAL #3   Baseline 19.6 average Lt   Time 8   Period Weeks   Status On-going   PT LONG TERM GOAL #4   Title Patient will report an overall improvement in pain/function by 50%   Status On-going               Plan - 12/18/14 1425    Clinical Impression Statement Channa continues to show improvement with function.  Following STM/trigger point release she reported pain relief and was given HEP exercises to assist with scapular stabilizer strengthening.    PT Next Visit Plan Continue with scapular strengthening, DNF, manual stm for upper trap tightness   PT Home Exercise Plan I's, T's, and Y's, and shoulder external rotation with scapular retraction   Consulted and Agree with Plan of Care Patient        Problem List Patient Active Problem List   Diagnosis Date Noted  . Biceps tendinopathy 08/15/2014  . Nausea alone 09/06/2013   . History of smoking 09/06/2013  . Chest pain 09/05/2013  . Dizziness - light-headed 04/29/2012  . Arthritis   . Chest pain 02/03/2011  . Iritis, recurrent 01/16/2011  . PLANTAR FASCIITIS, BILATERAL 08/15/2010  . COUGH, CHRONIC 08/15/2010  . FIBROIDS, UTERUS 12/21/2009  . ASCUS PAP 10/05/2009  . CERVICAL RADICULOPATHY 07/21/2007  . SICKLE CELL TRAIT 11/26/2006  . HYPERTENSION, BENIGN SYSTEMIC 11/26/2006   Starr Lake PT, DPT, LAT, ATC  12/18/2014  2:34 PM   [place] [facility] [address] [place], [place], [address] Phone: [phone]   Fax:  [phone]

## 2014-12-27 ENCOUNTER — Ambulatory Visit: Payer: 59 | Admitting: Physical Therapy

## 2014-12-27 DIAGNOSIS — M25512 Pain in left shoulder: Secondary | ICD-10-CM

## 2014-12-27 DIAGNOSIS — M67922 Unspecified disorder of synovium and tendon, left upper arm: Secondary | ICD-10-CM

## 2014-12-27 NOTE — Therapy (Signed)
[place], [place], [address] Phone: [phone]   Fax:  [phone]  Physical Therapy Treatment  Patient Details  Name: Sheila Fernandez MRN: [id number] Date of Birth: May 26, 1951 Referring Provider:  Leeanne Rio, MD  Encounter Date: 12/27/2014      PT End of Session - 12/27/14 0916    Visit Number 8   Number of Visits 16   Date for PT Re-Evaluation 01/02/15   PT Start Time 0820   PT Stop Time 0910   PT Time Calculation (min) 50 min   Activity Tolerance Patient tolerated treatment well   Behavior During Therapy [facility] for tasks assessed/performed      Past Medical History  Diagnosis Date  . Adenomyosis   . Uterine fibroid   . Hypertension   . Iritis, recurrent   . Arthritis     Past Surgical History  Procedure Laterality Date  . Cesarean section    . Cholecystectomy    . Breast lumpectomy    . Foot surgery      There were no vitals filed for this visit.  Visit Diagnosis:  Pain in joint, shoulder region, left  Biceps tendinopathy, left      Subjective Assessment - 12/27/14 0830    Symptoms She reports that she is feeling about the same, some increased soreness follow her exercises in her L upper trap.    Pain Score 6    Pain Location Shoulder   Pain Orientation Left   Pain Descriptors / Indicators Aching;Dull   Pain Type Acute pain   Pain Onset More than a month ago   Pain Frequency Constant                       [facility] Adult PT Treatment/Exercise - 12/27/14 0833    Shoulder Exercises: Supine   Protraction AROM;Strengthening;Left;15 reps   Other Supine Exercises chin tucks x 10 with 5 sec hold   Shoulder Exercises: Seated   Retraction AROM;Strengthening;Both;15 reps   Shoulder Exercises: Prone   Retraction --  with external rotation   Other Prone Exercises I's T's and Y's x 15 ea  with 2#   Shoulder Exercises: Standing   External Rotation  AROM;Strengthening;Left;15 reps;Theraband   Theraband Level (Shoulder External Rotation) Level 2 (Red)   Internal Rotation AROM;Strengthening;Left;15 reps;Theraband   Theraband Level (Shoulder Internal Rotation) Level 2 (Red)   Row AROM;Strengthening;Both;15 reps;Theraband   Theraband Level (Shoulder Row) Level 2 (Red)   Shoulder Exercises: ROM/Strengthening   UBE (Upper Arm Bike) Nu step L3 x 6 min   Manual Therapy   Manual Therapy Myofascial release;Other (comment)   Myofascial Release Lt upper trap, neck scapular area    Other Manual Therapy L upper trap inhibition taping   Neck Exercises: Stretches   Upper Trapezius Stretch 2 reps;30 seconds   Levator Stretch 2 reps;30 seconds   Corner Stretch 2 reps;30 seconds                PT Education - 12/27/14 0915    Education provided Yes   Education Details upper trap inhibition taping, posture/ mechanics of lifting using LUE   Person(s) Educated Patient   Methods Explanation   Comprehension Verbalized understanding          PT Short Term Goals - 11/30/14 1358    PT SHORT TERM GOAL #2   Baseline 122 vdegrees   Time 4   Period Weeks   Status On-going  PT SHORT TERM GOAL #3   Baseline Rt 69, Lt 36 degrees side bend, Rotation Rt 69, Lt 29   Time 4   Status Partially Met   PT SHORT TERM GOAL #4   Status On-going           PT Long Term Goals - 11/30/14 1400    PT LONG TERM GOAL #1   Status On-going   PT LONG TERM GOAL #2   Status On-going   PT LONG TERM GOAL #3   Baseline 19.6 average Lt   Time 8   Period Weeks   Status On-going   PT LONG TERM GOAL #4   Title Patient will report an overall improvement in pain/function by 50%   Status On-going               Plan - 12/27/14 0916    Clinical Impression Statement [name] continues to exhibit L upper trap dominance/tightness with referral of intemrittent symptoms to the posterior aspect of her head. Following STM/trigger point release she reported  some relief. PT did upper trap inhibiton taping following massage to reduce upper trap activiation to which she reported significant relief of tightness/ pain.    PT Next Visit Plan Continue with scapular strengthening, DNF, manual stm for upper trap tightness, upper trap inhibition taping   PT Home Exercise Plan same as previous   Consulted and Agree with Plan of Care Patient        Problem List Patient Active Problem List   Diagnosis Date Noted  . Biceps tendinopathy 08/15/2014  . Nausea alone 09/06/2013  . History of smoking 09/06/2013  . Chest pain 09/05/2013  . Dizziness - light-headed 04/29/2012  . Arthritis   . Chest pain 02/03/2011  . Iritis, recurrent 01/16/2011  . PLANTAR FASCIITIS, BILATERAL 08/15/2010  . COUGH, CHRONIC 08/15/2010  . FIBROIDS, UTERUS 12/21/2009  . ASCUS PAP 10/05/2009  . CERVICAL RADICULOPATHY 07/21/2007  . SICKLE CELL TRAIT 11/26/2006  . HYPERTENSION, BENIGN SYSTEMIC 11/26/2006   Sheila Fernandez PT, DPT, LAT, ATC  12/27/2014  9:21 AM    [place] [facility] [address] [place], [place], [address] Phone: [phone]   Fax:  [phone]

## 2014-12-28 ENCOUNTER — Ambulatory Visit: Payer: 59 | Admitting: Physical Therapy

## 2014-12-28 DIAGNOSIS — M25512 Pain in left shoulder: Secondary | ICD-10-CM

## 2014-12-28 DIAGNOSIS — M67922 Unspecified disorder of synovium and tendon, left upper arm: Secondary | ICD-10-CM

## 2014-12-28 NOTE — Therapy (Signed)
[place], [place], [address] Phone: [phone]   Fax:  [phone]  Physical Therapy Treatment  Patient Details  Name: Sheila Fernandez MRN: [id number] Date of Birth: [birth year] Referring Provider:  Leeanne Rio, MD  Encounter Date: 12/28/2014      PT End of Session - 12/28/14 0806    Visit Number 9   Number of Visits 16   Date for PT Re-Evaluation 01/02/15   PT Start Time 0800   PT Stop Time 0850   PT Time Calculation (min) 50 min   Activity Tolerance Patient tolerated treatment well   Behavior During Therapy [facility] for tasks assessed/performed      Past Medical History  Diagnosis Date  . Adenomyosis   . Uterine fibroid   . Hypertension   . Iritis, recurrent   . Arthritis     Past Surgical History  Procedure Laterality Date  . Cesarean section    . Cholecystectomy    . Breast lumpectomy    . Foot surgery      There were no vitals filed for this visit.  Visit Diagnosis:  Pain in joint, shoulder region, left  Biceps tendinopathy, left      Subjective Assessment - 12/28/14 0802    Symptoms "I felt a little sore leaving yesterday, but it eased up some later and still feels good now"    Currently in Pain? Yes   Pain Score 6    Pain Location Shoulder   Pain Orientation Left   Pain Descriptors / Indicators Aching;Dull   Pain Type --  sub acute   Pain Onset More than a month ago   Pain Frequency Constant                       [facility] Adult PT Treatment/Exercise - 12/28/14 0810    Shoulder Exercises: Supine   Protraction AROM;Strengthening;Left;15 reps   Other Supine Exercises chin tucks x 10 with 5 sec hold   Shoulder Exercises: Prone   Other Prone Exercises I's T's and Y's x 15 ea  2#   Shoulder Exercises: Standing   External Rotation AROM;Strengthening;Left;15 reps;Theraband   Theraband Level (Shoulder External Rotation) Level 3 (Green)   Internal  Rotation AROM;Strengthening;Left;15 reps;Theraband   Theraband Level (Shoulder Internal Rotation) Level 3 (Green)   Row AROM;Strengthening;Both;15 reps;Theraband   Theraband Level (Shoulder Row) Level 3 (Green)   Retraction AROM;Strengthening;Both;15 reps;Theraband;Other (comment)  with ER   Theraband Level (Shoulder Retraction) Level 2 (Red)   Moist Heat Therapy   Number Minutes Moist Heat 10 Minutes   Moist Heat Location Shoulder   Manual Therapy   Manual Therapy Myofascial release;Other (comment)   Joint Mobilization C3-C4 left lateral glides/ P/A glides;manual cervical distraction and    Myofascial Release sub-occipitals   Neck Exercises: Stretches   Upper Trapezius Stretch 2 reps;30 seconds   Levator Stretch 2 reps;30 seconds   Corner Stretch 2 reps;30 seconds                PT Education - 12/28/14 0838    Education provided Yes   Education Details sub-occipital stretching and correcting Forward head posture during sitting.    Person(s) Educated Patient   Methods Explanation   Comprehension Verbalized understanding          PT Short Term Goals - 11/30/14 1358    PT SHORT TERM GOAL #2   Baseline 122 vdegrees   Time 4   Period  Weeks   Status On-going   PT SHORT TERM GOAL #3   Baseline Rt 69, Lt 36 degrees side bend, Rotation Rt 69, Lt 29   Time 4   Status Partially Met   PT SHORT TERM GOAL #4   Status On-going           PT Long Term Goals - 11/30/14 1400    PT LONG TERM GOAL #1   Status On-going   PT LONG TERM GOAL #2   Status On-going   PT LONG TERM GOAL #3   Baseline 19.6 average Lt   Time 8   Period Weeks   Status On-going   PT LONG TERM GOAL #4   Title Patient will report an overall improvement in pain/function by 50%   Status On-going               Plan - 12/28/14 0298    Clinical Impression Statement pt continues to demonstrate increased L upper trap tightness/ pain with incidence of HA. She is able to perform all exercises  without complaint and reports decrease in symptoms following sub-occipital release and cervical mobilization. plan to perform re-evaluation next visit.   PT Next Visit Plan Continue with scapular strengthening, DNF, manual stm for upper trap tightness, upper trap inhibition taping PRN, ERO   PT Home Exercise Plan Same as previous        Problem List Patient Active Problem List   Diagnosis Date Noted  . Biceps tendinopathy 08/15/2014  . Nausea alone 09/06/2013  . History of smoking 09/06/2013  . Chest pain 09/05/2013  . Dizziness - light-headed 04/29/2012  . Arthritis   . Chest pain 02/03/2011  . Iritis, recurrent 01/16/2011  . PLANTAR FASCIITIS, BILATERAL 08/15/2010  . COUGH, CHRONIC 08/15/2010  . FIBROIDS, UTERUS 12/21/2009  . ASCUS PAP 10/05/2009  . CERVICAL RADICULOPATHY 07/21/2007  . SICKLE CELL TRAIT 11/26/2006  . HYPERTENSION, BENIGN SYSTEMIC 11/26/2006   Starr Lake PT, DPT, LAT, ATC  12/28/2014  9:39 AM   [facility] [address] [place], [place], [address] Phone: [phone]   Fax:  [phone]

## 2015-01-02 ENCOUNTER — Ambulatory Visit: Payer: 59 | Attending: Family Medicine | Admitting: Physical Therapy

## 2015-01-02 DIAGNOSIS — M25512 Pain in left shoulder: Secondary | ICD-10-CM

## 2015-01-02 DIAGNOSIS — M67922 Unspecified disorder of synovium and tendon, left upper arm: Secondary | ICD-10-CM | POA: Diagnosis not present

## 2015-01-02 NOTE — Therapy (Signed)
[place], [place], [address] Phone: [phone]   Fax:  [phone]  Physical Therapy Treatment  Patient Details  Name: Sheila Fernandez MRN: [id number] Date of Birth: 1950-11-18 Referring Provider:  Leeanne Rio, MD  Encounter Date: 01/02/2015      PT End of Session - 01/02/15 1259    Visit Number 10   Number of Visits 16   Date for PT Re-Evaluation 02/13/15   PT Start Time 0845   PT Stop Time 0938   PT Time Calculation (min) 53 min   Activity Tolerance Patient tolerated treatment well   Behavior During Therapy [facility] for tasks assessed/performed      Past Medical History  Diagnosis Date  . Adenomyosis   . Uterine fibroid   . Hypertension   . Iritis, recurrent   . Arthritis     Past Surgical History  Procedure Laterality Date  . Cesarean section    . Cholecystectomy    . Breast lumpectomy    . Foot surgery      There were no vitals filed for this visit.  Visit Diagnosis:  Pain in joint, shoulder region, left - Plan: PT plan of care cert/re-cert  Biceps tendinopathy, left - Plan: PT plan of care cert/re-cert      Subjective Assessment - 01/02/15 0854    Subjective "I feel like I have been doing well in the shoulder, but have some muscle tightness on the inside of my left shoulder blade"   Currently in Pain? Yes   Pain Score 6    Pain Location Shoulder   Pain Orientation Left   Pain Descriptors / Indicators Aching;Dull   Pain Type Chronic pain   Pain Onset More than a month ago   Pain Frequency Intermittent   Aggravating Factors  lifting the arm really high   Pain Relieving Factors Ice            [facility] PT Assessment - 01/02/15 0901    Observation/Other Assessments   Quick DASH  40.9   AROM   Right Shoulder Flexion 160 Degrees   Right Shoulder ABduction 168 Degrees   Left Shoulder Flexion 120 Degrees   Left Shoulder ABduction 118 Degrees   Cervical Flexion 60    Cervical - Right Side Bend 34   Cervical - Left Side Bend 34   Cervical - Right Rotation 60   Cervical - Left Rotation 45   Strength   Left Shoulder Flexion 4-/5   Left Shoulder Extension 4+/5   Left Shoulder ABduction 4/5   Left Shoulder Internal Rotation 4/5   Left Shoulder External Rotation 4/5                   [facility] Adult PT Treatment/Exercise - 01/02/15 0900    Shoulder Exercises: ROM/Strengthening   UBE (Upper Arm Bike) UBE L 1.5 x 6 min, alt dir every 2 min   Moist Heat Therapy   Number Minutes Moist Heat 10 Minutes   Moist Heat Location Shoulder                PT Education - 01/02/15 1259    Education provided Yes   Education Details rhomboid stretch, Re-evaluation and continued plan of care   Person(s) Educated Patient   Methods Explanation   Comprehension Verbalized understanding          PT Short Term Goals - 01/02/15 1306    PT SHORT TERM GOAL #1  Title "Independent with initial HEP   Time 4   Period Weeks   Status Achieved   PT SHORT TERM GOAL #2   Title Left shoulder flexion and abduction improved to 153 degrees needed for household chores.   Baseline 122 vdegrees   Time 4   Period Weeks   Status On-going   PT SHORT TERM GOAL #3   Title Cervical sidebending improved to 25 degrees and cervical rotation to 45 degrees needed for home and work mobility   Baseline Rt 69, Lt 36 degrees side bend, Rotation Rt 69, Lt 29   Time 4   Period Weeks   Status Achieved   PT SHORT TERM GOAL #4   Title Overall pain and function improved by 25%   Time 4   Period Weeks   Status On-going           PT Long Term Goals - 01/02/15 1307    PT LONG TERM GOAL #1   Title "Pt will be independent with advanced HEP.    Time 8   Period Weeks   Status On-going   PT LONG TERM GOAL #2   Title The patient will have improved left UE strength in shoulder to 4-/5 needed for sweeping and mopping.   Time 8   Period Weeks   Status Achieved   PT LONG  TERM GOAL #3   Title Left grip strength improved to 26# needed for lifting household items, dishes   Baseline 19.6 average Lt   Time 8   Period Weeks   Status On-going   PT LONG TERM GOAL #4   Title Patient will report an overall improvement in pain/function by 50%   Time 8   Period Weeks   Status On-going               Plan - 01/02/15 1301    Clinical Impression Statement Charvi has mad great progress with improved shoulder /neck AROM and bil shoulder strength. She continues to demonstrate intermittent tightness of the bil upper traps/sub-occpitals and rhomboids. Performed some trigger point release on the Left rhomoids which she reported some relief with. She would benefit from continued physical therapy to improve her functional capacity.   Pt will benefit from skilled therapeutic intervention in order to improve on the following deficits Pain;Increased muscle spasms;Decreased strength;Decreased range of motion   Rehab Potential Good   PT Frequency 1x / week   PT Duration 6 weeks   PT Treatment/Interventions ADLs/Self Care Home Management;Cryotherapy;Electrical Stimulation;Moist Heat;Ultrasound;Therapeutic exercise;Patient/family education;Manual techniques;Neuromuscular re-education   PT Next Visit Plan Continue with scapular strengthening, DNF, manual stm for upper trap tightness, upper trap inhibition taping PRN, dry needling?   PT Home Exercise Plan Same as previous   Consulted and Agree with Plan of Care Patient        Problem List Patient Active Problem List   Diagnosis Date Noted  . Biceps tendinopathy 08/15/2014  . Nausea alone 09/06/2013  . History of smoking 09/06/2013  . Chest pain 09/05/2013  . Dizziness - light-headed 04/29/2012  . Arthritis   . Chest pain 02/03/2011  . Iritis, recurrent 01/16/2011  . PLANTAR FASCIITIS, BILATERAL 08/15/2010  . COUGH, CHRONIC 08/15/2010  . FIBROIDS, UTERUS 12/21/2009  . ASCUS PAP 10/05/2009  . CERVICAL RADICULOPATHY  07/21/2007  . SICKLE CELL TRAIT 11/26/2006  . HYPERTENSION, BENIGN SYSTEMIC 11/26/2006   Starr Lake PT, DPT, LAT, ATC  01/02/2015  1:21 PM   [facility]  [place], [place], [address] Phone: [phone]   Fax:  [phone]

## 2015-01-02 NOTE — Patient Instructions (Signed)
   Elanie Hammitt PT, DPT, LAT, ATC  [facility] Phone: [phone]     

## 2015-01-04 ENCOUNTER — Ambulatory Visit: Payer: 59 | Admitting: Physical Therapy

## 2015-01-04 DIAGNOSIS — M25512 Pain in left shoulder: Secondary | ICD-10-CM

## 2015-01-04 DIAGNOSIS — M67922 Unspecified disorder of synovium and tendon, left upper arm: Secondary | ICD-10-CM

## 2015-01-04 NOTE — Therapy (Signed)
[place], [place], [address] Phone: [phone]   Fax:  [phone]  Physical Therapy Treatment  Patient Details  Name: Sheila Fernandez MRN: [id number] Date of Birth: Jul 24, 1951 Referring Provider:  Leeanne Rio, MD  Encounter Date: 01/04/2015      PT End of Session - 01/04/15 1502    Visit Number 11   Number of Visits 16   Date for PT Re-Evaluation 02/13/15   PT Start Time 0220   PT Stop Time 0300   PT Time Calculation (min) 40 min      Past Medical History  Diagnosis Date  . Adenomyosis   . Uterine fibroid   . Hypertension   . Iritis, recurrent   . Arthritis     Past Surgical History  Procedure Laterality Date  . Cesarean section    . Cholecystectomy    . Breast lumpectomy    . Foot surgery      There were no vitals filed for this visit.  Visit Diagnosis:  Pain in joint, shoulder region, left  Biceps tendinopathy, left      Subjective Assessment - 01/04/15 1530    Subjective Not bad today   Currently in Pain? Yes   Pain Score 4    Pain Location Shoulder   Pain Orientation Left                       [facility] Adult PT Treatment/Exercise - 01/04/15 1433    Shoulder Exercises: Prone   Retraction 10 reps   Other Prone Exercises I's T's, Bent T's  and Y's x 10 ea  no wt   Shoulder Exercises: Standing   Horizontal ABduction Strengthening;Both;10 reps;15 reps;Theraband   Theraband Level (Shoulder Horizontal ABduction) Level 2 (Red)   Horizontal ABduction Limitations also standing  diagonals x 10 each way   External Rotation Strengthening;Left;15 reps   Theraband Level (Shoulder External Rotation) Level 3 (Green)   Internal Rotation Strengthening;Left;15 reps   Theraband Level (Shoulder Internal Rotation) Level 3 (Green)   Extension Strengthening;Both;15 reps   Theraband Level (Shoulder Extension) Level 3 (Green)   Row Strengthening;Both;10 reps;20  reps;Theraband   Theraband Level (Shoulder Row) Level 3 (Green)   Other Standing Exercises Bilateral ER with red band 10x 2   Shoulder Exercises: ROM/Strengthening   UBE (Upper Arm Bike) UBE L 1.5 x 7 min, alt dir every 2 min                  PT Short Term Goals - 01/04/15 1504    PT SHORT TERM GOAL #1   Title "Independent with initial HEP   Time 4   Period Weeks   Status Achieved   PT SHORT TERM GOAL #2   Title Left shoulder flexion and abduction improved to 153 degrees needed for household chores.   Time 4   Period Weeks   Status On-going   PT SHORT TERM GOAL #3   Title Cervical sidebending improved to 25 degrees and cervical rotation to 45 degrees needed for home and work mobility   Baseline Rt 69, Lt 36 degrees side bend, Rotation Rt 69, Lt 29   Time 4   Period Weeks   Status Achieved   PT SHORT TERM GOAL #4   Title Overall pain and function improved by 25%   Time 4   Period Weeks   Status Achieved           PT  Long Term Goals - 01/02/15 1307    PT LONG TERM GOAL #1   Title "Pt will be independent with advanced HEP.    Time 8   Period Weeks   Status On-going   PT LONG TERM GOAL #2   Title The patient will have improved left UE strength in shoulder to 4-/5 needed for sweeping and mopping.   Time 8   Period Weeks   Status Achieved   PT LONG TERM GOAL #3   Title Left grip strength improved to 26# needed for lifting household items, dishes   Baseline 19.6 average Lt   Time 8   Period Weeks   Status On-going   PT LONG TERM GOAL #4   Title Patient will report an overall improvement in pain/function by 50%   Time 8   Period Weeks   Status On-going               Plan - 01/04/15 1507    Clinical Impression Statement STG# 4 met. Pt reports >25% decrease in pain thus far. Her pain is less today than it was last time. She has been distracted from her HEP due to personal issues. She was able to tolerate standing and prone shoulder and scap  exercies and reported no increased pain at end of treament. She declined modalities   PT Next Visit Plan Review [place], check grip strength, continue scap and shoulder strength, manual, heat, tape, dry needling PRN        Problem List Patient Active Problem List   Diagnosis Date Noted  . Biceps tendinopathy 08/15/2014  . Nausea alone 09/06/2013  . History of smoking 09/06/2013  . Chest pain 09/05/2013  . Dizziness - light-headed 04/29/2012  . Arthritis   . Chest pain 02/03/2011  . Iritis, recurrent 01/16/2011  . PLANTAR FASCIITIS, BILATERAL 08/15/2010  . COUGH, CHRONIC 08/15/2010  . FIBROIDS, UTERUS 12/21/2009  . ASCUS PAP 10/05/2009  . CERVICAL RADICULOPATHY 07/21/2007  . SICKLE CELL TRAIT 11/26/2006  . HYPERTENSION, BENIGN SYSTEMIC 11/26/2006    Dorene Ar, PTA 01/04/2015, 3:31 PM  [facility] [address] [place], [place], [address] Phone: [phone]   Fax:  [phone]

## 2015-01-17 ENCOUNTER — Ambulatory Visit: Payer: 59

## 2015-01-17 DIAGNOSIS — M25512 Pain in left shoulder: Secondary | ICD-10-CM

## 2015-01-17 DIAGNOSIS — M67922 Unspecified disorder of synovium and tendon, left upper arm: Secondary | ICD-10-CM

## 2015-01-17 DIAGNOSIS — M436 Torticollis: Secondary | ICD-10-CM

## 2015-01-17 NOTE — Therapy (Signed)
[place], [place], [address] Phone: [phone]   Fax:  [phone]  Physical Therapy Treatment  Patient Details  Name: Sheila Fernandez MRN: [id number] Date of Birth: 09-26-1951 Referring Provider:  Leeanne Rio, MD  Encounter Date: 01/17/2015      PT End of Session - 01/17/15 1133    Visit Number 12   Number of Visits 16   Date for PT Re-Evaluation 02/13/15   PT Start Time 1100   PT Stop Time 1148   PT Time Calculation (min) 48 min   Activity Tolerance Patient tolerated treatment well   Behavior During Therapy [facility] for tasks assessed/performed      Past Medical History  Diagnosis Date  . Adenomyosis   . Uterine fibroid   . Hypertension   . Iritis, recurrent   . Arthritis     Past Surgical History  Procedure Laterality Date  . Cesarean section    . Cholecystectomy    . Breast lumpectomy    . Foot surgery      There were no vitals filed for this visit.  Visit Diagnosis:  Pain in joint, shoulder region, left  Biceps tendinopathy, left  Stiffness of neck      Subjective Assessment - 01/17/15 1102    Subjective [place]. Shoulder about the same.    Currently in Pain? Yes   Pain Score 5    Multiple Pain Sites No            [facility] PT Assessment - 01/17/15 1119    AROM   Left Shoulder Flexion 130 Degrees  155 degrees passively   Left Shoulder ABduction 117 Degrees   Left Shoulder Internal Rotation 62 Degrees   Left Shoulder External Rotation 90 Degrees   Cervical - Right Side Bend 34   Cervical - Left Side Bend 30   Cervical - Right Rotation 75   Cervical - Left Rotation 56   Strength   Left Shoulder Flexion 5/5   Left Shoulder Extension 5/5   Left Shoulder ABduction 5/5   Left Shoulder Internal Rotation 5/5   Left Shoulder External Rotation 5/5                     [facility] Adult PT Treatment/Exercise - 01/17/15 1108    Shoulder Exercises: Prone   Other Prone Exercises I's T's, Bent T's  and Y's x 12 ea   Shoulder Exercises: Standing   Horizontal ABduction Strengthening;Both;10 reps;15 reps;Theraband   Theraband Level (Shoulder Horizontal ABduction) Level 3 (Green)   External Rotation Strengthening;Left;15 reps   Theraband Level (Shoulder External Rotation) Level 3 (Green)   Internal Rotation Strengthening;Left;15 reps   Theraband Level (Shoulder Internal Rotation) Level 3 (Green)   Extension Strengthening;Both;15 reps   Theraband Level (Shoulder Extension) Level 3 (Green)   Row Strengthening;Both;20 reps;Theraband;15 reps   Theraband Level (Shoulder Row) Level 3 (Green)   Shoulder Exercises: ROM/Strengthening   UBE (Upper Arm Bike) UBE L 1.5 x 4 min, alt dir every 2 min   Moist Heat Therapy   Number Minutes Moist Heat 15 Minutes   Moist Heat Location Shoulder  and neck                  PT Short Term Goals - 01/17/15 1135    PT SHORT TERM GOAL #2   Title Left shoulder flexion and abduction improved to 153 degrees needed for household chores.   Status On-going   PT  SHORT TERM GOAL #3   Title Cervical sidebending improved to 25 degrees and cervical rotation to 45 degrees needed for home and work mobility           PT Long Term Goals - 01/17/15 1136    PT LONG TERM GOAL #1   Title "Pt will be independent with advanced HEP.    Status On-going   PT LONG TERM GOAL #2   Title The patient will have improved left UE strength in shoulder to 4-/5 needed for sweeping and mopping.   Status Achieved   PT LONG TERM GOAL #3   Title Left grip strength improved to 26# needed for lifting household items, dishes   Status On-going   PT LONG TERM GOAL #4   Title Patient will report an overall improvement in pain/function by 50%   Status On-going               Plan - 01/17/15 1133    Clinical Impression Statement Her pain levels have not changed in 6 weeks and her range has not improved except with cervical  rotation. . She has not been here in 2 weeks . I have asked her to call MD for appointment and to finish 4 visits as planed   PT Frequency 2x / week   PT Duration 2 weeks   PT Next Visit Plan Check grip strength, continue strength and modalityies PRN. Manual for neck and sholuder motion   Consulted and Agree with Plan of Care Patient        Problem List Patient Active Problem List   Diagnosis Date Noted  . Biceps tendinopathy 08/15/2014  . Nausea alone 09/06/2013  . History of smoking 09/06/2013  . Chest pain 09/05/2013  . Dizziness - light-headed 04/29/2012  . Arthritis   . Chest pain 02/03/2011  . Iritis, recurrent 01/16/2011  . PLANTAR FASCIITIS, BILATERAL 08/15/2010  . COUGH, CHRONIC 08/15/2010  . FIBROIDS, UTERUS 12/21/2009  . ASCUS PAP 10/05/2009  . CERVICAL RADICULOPATHY 07/21/2007  . SICKLE CELL TRAIT 11/26/2006  . HYPERTENSION, BENIGN SYSTEMIC 11/26/2006    Darrel Hoover PT 01/17/2015, 11:38 AM  [facility] [address] [place], [place], [address] Phone: [phone]   Fax:  [phone]

## 2015-01-22 ENCOUNTER — Encounter: Payer: Self-pay | Admitting: Physical Therapy

## 2015-01-22 ENCOUNTER — Ambulatory Visit: Payer: 59 | Admitting: Physical Therapy

## 2015-01-22 DIAGNOSIS — M67922 Unspecified disorder of synovium and tendon, left upper arm: Secondary | ICD-10-CM

## 2015-01-22 DIAGNOSIS — M25512 Pain in left shoulder: Secondary | ICD-10-CM | POA: Diagnosis not present

## 2015-01-22 DIAGNOSIS — M436 Torticollis: Secondary | ICD-10-CM

## 2015-01-22 NOTE — Therapy (Signed)
[place], [place], [address] Phone: [phone]   Fax:  [phone]  Physical Therapy Treatment  Patient Details  Name: Sheila Fernandez MRN: [id number] Date of Birth: Apr 15, 1951 Referring Provider:  Leeanne Rio, MD  Encounter Date: 01/22/2015      PT End of Session - 01/22/15 1717    Visit Number 13   Number of Visits 16   Date for PT Re-Evaluation 02/13/15   PT Start Time 8381   PT Stop Time 1505   PT Time Calculation (min) 45 min      Past Medical History  Diagnosis Date  . Adenomyosis   . Uterine fibroid   . Hypertension   . Iritis, recurrent   . Arthritis     Past Surgical History  Procedure Laterality Date  . Cesarean section    . Cholecystectomy    . Breast lumpectomy    . Foot surgery      There were no vitals filed for this visit.  Visit Diagnosis:  Pain in joint, shoulder region, left  Stiffness of neck  Biceps tendinopathy, left      Subjective Assessment - 01/22/15 1422    Subjective unable to get appt right now with MD, need to call back, plans to later today; reports overall 65% improvement   Currently in Pain? Yes   Pain Score 3    Pain Location Shoulder   Pain Orientation Left   Pain Descriptors / Indicators Aching;Nagging  just doesn't go away   Pain Type Chronic pain   Aggravating Factors  lift heavy or high   Pain Relieving Factors exercise, meds, ice and heat alternating            [facility] PT Assessment - 01/22/15 0001    Strength   Right/Left hand Left   Grip (lbs) 48  left                     [facility] Adult PT Treatment/Exercise - 01/22/15 1424    Shoulder Exercises: Seated   Other Seated Exercises posterior shoulder rolls 2 x 10 reps   Shoulder Exercises: Standing   Flexion AROM;10 reps  wall slides   ABduction AROM;10 reps  wall slides   Shoulder Exercises: Therapy Ball   Flexion 10 reps  LUE min vc's   ABduction  10 reps  L shoulder min vc's   Shoulder Exercises: ROM/Strengthening   UBE (Upper Arm Bike) UBE L 1.5 x 2 min foward, x 2 min backward min vc's    Moist Heat Therapy   Number Minutes Moist Heat 7 Minutes  pt needed to leave   Moist Heat Location Shoulder  (left) and neck   Manual Therapy   Manual Therapy Passive ROM   Joint Mobilization GH joint mobs Grade 2 posterior and inferior   Passive ROM flexion and abdct x 10 reps each, limited by pain   Other Manual Therapy STW to L upper trap                  PT Short Term Goals - 01/17/15 1135    PT SHORT TERM GOAL #2   Title Left shoulder flexion and abduction improved to 153 degrees needed for household chores.   Status On-going   PT SHORT TERM GOAL #3   Title Cervical sidebending improved to 25 degrees and cervical rotation to 45 degrees needed for home and work mobility  PT Long Term Goals - 01/22/15 1712    PT LONG TERM GOAL #1   Title "Pt will be independent with advanced HEP.    Time 8   Period Weeks   Status On-going   PT LONG TERM GOAL #2   Title The patient will have improved left UE strength in shoulder to 4-/5 needed for sweeping and mopping.   Time 8   Period Weeks   Status Achieved   PT LONG TERM GOAL #3   Title Left grip strength improved to 26# needed for lifting household items, dishes   Baseline Average 48 Lt   Time 8   Period Weeks   Status Achieved   PT LONG TERM GOAL #4   Title Patient will report an overall improvement in pain/function by 50%   Time 8   Period Weeks   Status Achieved  Met 01/22/15               Plan - 01/22/15 1719    Clinical Impression Statement Pt reports slight decrease in pain compared to last visit and reports pain gets better with exercise. L grip strength has improved meeting LTG. Pt's L shoulder continues to be elevated compared to right and Left Upper traps appear to be overactive. Pt continues to demonstrate sensitivity to manual techniques  including mobilizations. Pt has not yet scheduled follow up appointment with MD but plans to call after PT today.    PT Frequency 2x / week   PT Duration 2 weeks   PT Treatment/Interventions ADLs/Self Care Home Management;Cryotherapy;Electrical Stimulation;Moist Heat;Ultrasound;Therapeutic exercise;Patient/family education;Manual techniques;Neuromuscular re-education   PT Next Visit Plan manual as tolerate for neck and shoulder motion, continue to progress range as tolerated, finalize HEP and consider DC next week        Problem List Patient Active Problem List   Diagnosis Date Noted  . Biceps tendinopathy 08/15/2014  . Nausea alone 09/06/2013  . History of smoking 09/06/2013  . Chest pain 09/05/2013  . Dizziness - light-headed 04/29/2012  . Arthritis   . Chest pain 02/03/2011  . Iritis, recurrent 01/16/2011  . PLANTAR FASCIITIS, BILATERAL 08/15/2010  . COUGH, CHRONIC 08/15/2010  . FIBROIDS, UTERUS 12/21/2009  . ASCUS PAP 10/05/2009  . CERVICAL RADICULOPATHY 07/21/2007  . SICKLE CELL TRAIT 11/26/2006  . HYPERTENSION, BENIGN SYSTEMIC 11/26/2006    Romualdo Bolk, PT 01/22/2015, 5:25 PM  [facility] [address] [place], [place], [address] Phone: [phone]   Fax:  [phone]

## 2015-01-29 ENCOUNTER — Ambulatory Visit: Payer: 59 | Attending: Family Medicine

## 2015-01-29 DIAGNOSIS — M67922 Unspecified disorder of synovium and tendon, left upper arm: Secondary | ICD-10-CM | POA: Insufficient documentation

## 2015-01-29 DIAGNOSIS — M25512 Pain in left shoulder: Secondary | ICD-10-CM | POA: Insufficient documentation

## 2015-01-29 NOTE — Therapy (Signed)
[place], [place], [address] Phone: [phone]   Fax:  [phone]  Physical Therapy Treatment  Patient Details  Name: Sheila Fernandez MRN: [id number] Date of Birth: [birth year] Referring Provider:  Leeanne Rio, MD  Encounter Date: 01/29/2015      PT End of Session - 01/29/15 1449    Visit Number 14   Number of Visits 16   Date for PT Re-Evaluation 02/13/15   PT Start Time 0215   PT Stop Time 0305   PT Time Calculation (min) 50 min   Activity Tolerance Patient tolerated treatment well   Behavior During Therapy [facility] for tasks assessed/performed      Past Medical History  Diagnosis Date  . Adenomyosis   . Uterine fibroid   . Hypertension   . Iritis, recurrent   . Arthritis     Past Surgical History  Procedure Laterality Date  . Cesarean section    . Cholecystectomy    . Breast lumpectomy    . Foot surgery      There were no vitals filed for this visit.  Visit Diagnosis:  Pain in joint, shoulder region, left  Biceps tendinopathy, left      Subjective Assessment - 01/29/15 1421    Subjective Continue with pain 4/10   Currently in Pain? Yes   Pain Score 4    Pain Location Shoulder   Pain Orientation Left   Pain Descriptors / Indicators Aching   Pain Type Chronic pain   Pain Onset More than a month ago   Pain Frequency Intermittent   Aggravating Factors  using arm and lifting   Pain Relieving Factors meds , cod/heat   Multiple Pain Sites No            [facility] PT Assessment - 01/29/15 0001    AROM   Left Shoulder Flexion 146 Degrees   Left Shoulder ABduction 175 Degrees   Left Shoulder External Rotation 100 Degrees                     [facility] Adult PT Treatment/Exercise - 01/29/15 1426    Shoulder Exercises: Standing   External Rotation Strengthening;Left;15 reps  3 pounds    Flexion Strengthening;Left;10 reps;Weights  2 sets 3 pounds   ABduction  Strengthening;Left;10 reps;Weights  2 pounds  2 sets   Extension Strengthening;Left;10 reps  3 pounds 2 sets   Other Standing Exercises pushups off counter x 10    Shoulder Exercises: ROM/Strengthening   UBE (Upper Arm Bike) UBE L 1.5 x 3 min foward, x 3 min backward min vc's    Moist Heat Therapy   Number Minutes Moist Heat 15 Minutes   Moist Heat Location Shoulder                  PT Short Term Goals - 01/17/15 1135    PT SHORT TERM GOAL #2   Title Left shoulder flexion and abduction improved to 153 degrees needed for household chores.   Status On-going   PT SHORT TERM GOAL #3   Title Cervical sidebending improved to 25 degrees and cervical rotation to 45 degrees needed for home and work mobility           PT Long Term Goals - 01/22/15 1712    PT LONG TERM GOAL #1   Title "Pt will be independent with advanced HEP.    Time 8   Period Weeks   Status On-going  PT LONG TERM GOAL #2   Title The patient will have improved left UE strength in shoulder to 4-/5 needed for sweeping and mopping.   Time 8   Period Weeks   Status Achieved   PT LONG TERM GOAL #3   Title Left grip strength improved to 26# needed for lifting household items, dishes   Baseline Average 48 Lt   Time 8   Period Weeks   Status Achieved   PT LONG TERM GOAL #4   Title Patient will report an overall improvement in pain/function by 50%   Time 8   Period Weeks   Status Achieved  Met 01/22/15               Plan - 01/29/15 1450    Clinical Impression Statement Her pain is slightly more today but her performance of exercise was done without indication of significant pain doing all with weight. He passive range is normal.    PT Next Visit Plan Strengthening modalities as needed . Probable discharge after next 2 visits   Consulted and Agree with Plan of Care Patient        Problem List Patient Active Problem List   Diagnosis Date Noted  . Biceps tendinopathy 08/15/2014  . Nausea  alone 09/06/2013  . History of smoking 09/06/2013  . Chest pain 09/05/2013  . Dizziness - light-headed 04/29/2012  . Arthritis   . Chest pain 02/03/2011  . Iritis, recurrent 01/16/2011  . PLANTAR FASCIITIS, BILATERAL 08/15/2010  . COUGH, CHRONIC 08/15/2010  . FIBROIDS, UTERUS 12/21/2009  . ASCUS PAP 10/05/2009  . CERVICAL RADICULOPATHY 07/21/2007  . SICKLE CELL TRAIT 11/26/2006  . HYPERTENSION, BENIGN SYSTEMIC 11/26/2006    Darrel Hoover PT 01/29/2015, 2:53 PM  [facility] [address] [place], [place], [address] Phone: [phone]   Fax:  [phone]

## 2015-02-05 ENCOUNTER — Ambulatory Visit: Payer: 59

## 2015-02-05 DIAGNOSIS — M25512 Pain in left shoulder: Secondary | ICD-10-CM | POA: Diagnosis not present

## 2015-02-05 DIAGNOSIS — R29898 Other symptoms and signs involving the musculoskeletal system: Secondary | ICD-10-CM

## 2015-02-05 DIAGNOSIS — M67922 Unspecified disorder of synovium and tendon, left upper arm: Secondary | ICD-10-CM

## 2015-02-05 NOTE — Therapy (Signed)
[place], [place], [address] Phone: [phone]   Fax:  [phone]  Physical Therapy Treatment  Patient Details  Name: Sheila Fernandez MRN: [id number] Date of Birth: [birth year] Referring Provider:  Leeanne Rio, MD  Encounter Date: 02/05/2015      PT End of Session - 02/05/15 1531    Visit Number 15   Number of Visits 16   Date for PT Re-Evaluation 02/13/15   PT Start Time 0300   PT Stop Time 0350   PT Time Calculation (min) 50 min   Activity Tolerance Patient tolerated treatment well   Behavior During Therapy [facility] for tasks assessed/performed      Past Medical History  Diagnosis Date  . Adenomyosis   . Uterine fibroid   . Hypertension   . Iritis, recurrent   . Arthritis     Past Surgical History  Procedure Laterality Date  . Cesarean section    . Cholecystectomy    . Breast lumpectomy    . Foot surgery      There were no vitals filed for this visit.  Visit Diagnosis:  Pain in joint, shoulder region, left  Biceps tendinopathy, left  Weakness of left arm      Subjective Assessment - 02/05/15 1512    Subjective Continue with 4/10 pain.    Currently in Pain? Yes   Pain Score 4    Pain Location Shoulder   Pain Orientation Left   Pain Descriptors / Indicators Aching   Pain Type Chronic pain   Pain Onset More than a month ago   Pain Frequency Intermittent   Aggravating Factors  using arm    Pain Relieving Factors meds , cold/heat   Multiple Pain Sites No                         [facility] Adult PT Treatment/Exercise - 02/05/15 1513    Shoulder Exercises: Supine   Internal Rotation Strengthening;Left;15 reps   Internal Rotation Weight (lbs) 3   Internal Rotation Limitations and eccentric ER.    Shoulder Exercises: Prone   Retraction Strengthening;Left;15 reps;Weights   Retraction Weight (lbs) 3   Horizontal ABduction 1 Strengthening;Left;15 reps   Horizontal ABduction 1 Weight (lbs) 3   Shoulder Exercises: Sidelying   External Rotation Strengthening;Left;15 reps  3 pounds and 5 pounds  x 10   ABduction Strengthening;Left;12 reps  with forward arc back to hip   Shoulder Exercises: Standing   Other Standing Exercises placing weigh to second shelf in upper cabinet x 5 3 pounds and x 12 with 5 pounds.    Shoulder Exercises: ROM/Strengthening   UBE (Upper Arm Bike) UBE L 1.5 x 3 min foward, x 3 min backward min vc's    Moist Heat Therapy   Number Minutes Moist Heat 15 Minutes   Moist Heat Location Shoulder                  PT Short Term Goals - 01/17/15 1135    PT SHORT TERM GOAL #2   Title Left shoulder flexion and abduction improved to 153 degrees needed for household chores.   Status On-going   PT SHORT TERM GOAL #3   Title Cervical sidebending improved to 25 degrees and cervical rotation to 45 degrees needed for home and work mobility           PT Long Term Goals - 01/22/15 1712    PT LONG  TERM GOAL #1   Title "Pt will be independent with advanced HEP.    Time 8   Period Weeks   Status On-going   PT LONG TERM GOAL #2   Title The patient will have improved left UE strength in shoulder to 4-/5 needed for sweeping and mopping.   Time 8   Period Weeks   Status Achieved   PT LONG TERM GOAL #3   Title Left grip strength improved to 26# needed for lifting household items, dishes   Baseline Average 48 Lt   Time 8   Period Weeks   Status Achieved   PT LONG TERM GOAL #4   Title Patient will report an overall improvement in pain/function by 50%   Time 8   Period Weeks   Status Achieved  Met 01/22/15               Plan - 02/05/15 1531    Clinical Impression Statement she did very well with increased weight with exercise   PT Next Visit Plan Strengthening modalities as needed . Probable discharge after next  visit   Consulted and Agree with Plan of Care Patient        Problem List Patient  Active Problem List   Diagnosis Date Noted  . Biceps tendinopathy 08/15/2014  . Nausea alone 09/06/2013  . History of smoking 09/06/2013  . Chest pain 09/05/2013  . Dizziness - light-headed 04/29/2012  . Arthritis   . Chest pain 02/03/2011  . Iritis, recurrent 01/16/2011  . PLANTAR FASCIITIS, BILATERAL 08/15/2010  . COUGH, CHRONIC 08/15/2010  . FIBROIDS, UTERUS 12/21/2009  . ASCUS PAP 10/05/2009  . CERVICAL RADICULOPATHY 07/21/2007  . SICKLE CELL TRAIT 11/26/2006  . HYPERTENSION, BENIGN SYSTEMIC 11/26/2006    Darrel Hoover PT 02/05/2015, 3:33 PM  [facility] [address] [place], [place], [address] Phone: [phone]   Fax:  [phone]

## 2015-02-12 ENCOUNTER — Ambulatory Visit: Payer: 59

## 2015-02-12 DIAGNOSIS — M25512 Pain in left shoulder: Secondary | ICD-10-CM | POA: Diagnosis not present

## 2015-02-12 DIAGNOSIS — M436 Torticollis: Secondary | ICD-10-CM

## 2015-02-12 NOTE — Therapy (Signed)
[place], [place], [address] Phone: [phone]   Fax:  [phone]  Physical Therapy Treatment  Patient Details  Name: Sheila Fernandez MRN: [id number] Date of Birth: 09-09-1951 Referring Provider:  Leeanne Rio, MD  Encounter Date: 02/12/2015      PT End of Session - 02/12/15 1527    Visit Number 16   Number of Visits 22   Date for PT Re-Evaluation 03/23/15   PT Start Time 0300   PT Stop Time 0335   PT Time Calculation (min) 35 min   Activity Tolerance Patient tolerated treatment well   Behavior During Therapy [facility] for tasks assessed/performed      Past Medical History  Diagnosis Date  . Adenomyosis   . Uterine fibroid   . Hypertension   . Iritis, recurrent   . Arthritis     Past Surgical History  Procedure Laterality Date  . Cesarean section    . Cholecystectomy    . Breast lumpectomy    . Foot surgery      There were no vitals filed for this visit.  Visit Diagnosis:  Pain in joint, shoulder region, left - Plan: PT plan of care cert/re-cert  Stiffness of neck - Plan: PT plan of care cert/re-cert      Subjective Assessment - 02/12/15 1509    Subjective Pain 4/10   Currently in Pain? Yes   Pain Score 4    Pain Location Shoulder   Pain Orientation Left   Pain Descriptors / Indicators Aching   Pain Type Chronic pain   Pain Onset More than a month ago   Pain Frequency Intermittent   Aggravating Factors  using arm   Pain Relieving Factors meds , heat /cold   Multiple Pain Sites No            [facility] PT Assessment - 02/12/15 1515    AROM   Left Shoulder Flexion 150 Degrees   Left Shoulder ABduction 175 Degrees   Left Shoulder Internal Rotation 62 Degrees   Left Shoulder External Rotation 100 Degrees   Cervical - Right Side Bend 20   Cervical - Left Side Bend 20   Cervical - Right Rotation 65   Cervical - Left Rotation 60   Strength   Left Shoulder Flexion  5/5   Left Shoulder Extension 5/5   Left Shoulder ABduction 5/5   Left Shoulder Internal Rotation 5/5   Left Shoulder External Rotation 5/5   Grip (lbs) 48  LT     UBE 5 min L2.    Reviewed HEP and she is comfortable and does these daily.                           PT Short Term Goals - 02/12/15 1519    PT SHORT TERM GOAL #1   Title "Independent with initial HEP   Status Achieved   PT SHORT TERM GOAL #2   Title Left shoulder flexion and abduction improved to 153 degrees needed for household chores.   Baseline 150 degrees   Status Partially Met   PT SHORT TERM GOAL #3   Title Cervical sidebending improved to 25 degrees and cervical rotation to 45 degrees needed for home and work mobility   Status Achieved   PT SHORT TERM GOAL #4   Title Overall pain and function improved by 25%   Status Achieved  PT Long Term Goals - 02/12/15 1520    PT LONG TERM GOAL #1   Title "Pt will be independent with advanced HEP.    PT LONG TERM GOAL #2   Title The patient will have improved left UE strength in shoulder to 4-/5 needed for sweeping and mopping.   Status Achieved   PT LONG TERM GOAL #3   Title Left grip strength improved to 26# needed for lifting household items, dishes   Status Achieved   PT LONG TERM GOAL #4   Title Patient will report an overall improvement in pain/function by 50%   Status Achieved   PT LONG TERM GOAL #5   Title Pain improved 50% or mroe (2/10 or less)   Time 3   Period Weeks   Status New               Plan - 02/12/15 1529    Clinical Impression Statement Her pain is unchanged and she reports she di well for brief time with dry needling. She has normal strength and improved range in shoulder to functional level. Her neck is more stiff. She may benefit from dry needling intervention as her pain is not changing though her function and strength/range is better/functional   PT Frequency 2x / week  or 6 visits over next 4-6  weeks as neddling is limited availability   PT Duration 3 weeks   PT Treatment/Interventions Dry needling;Manual techniques   PT Next Visit Plan Needling and anything that PT feels needed   Consulted and Agree with Plan of Care Patient        Problem List Patient Active Problem List   Diagnosis Date Noted  . Biceps tendinopathy 08/15/2014  . Nausea alone 09/06/2013  . History of smoking 09/06/2013  . Chest pain 09/05/2013  . Dizziness - light-headed 04/29/2012  . Arthritis   . Chest pain 02/03/2011  . Iritis, recurrent 01/16/2011  . PLANTAR FASCIITIS, BILATERAL 08/15/2010  . COUGH, CHRONIC 08/15/2010  . FIBROIDS, UTERUS 12/21/2009  . ASCUS PAP 10/05/2009  . CERVICAL RADICULOPATHY 07/21/2007  . SICKLE CELL TRAIT 11/26/2006  . HYPERTENSION, BENIGN SYSTEMIC 11/26/2006    Darrel Hoover PT 02/12/2015, 3:44 PM  [facility] [address] [place], [place], [address] Phone: [phone]   Fax:  [phone]

## 2015-03-01 ENCOUNTER — Ambulatory Visit (INDEPENDENT_AMBULATORY_CARE_PROVIDER_SITE_OTHER): Payer: 59 | Admitting: Family Medicine

## 2015-03-01 ENCOUNTER — Encounter: Payer: Self-pay | Admitting: Family Medicine

## 2015-03-01 VITALS — BP 153/95 | HR 64 | Temp 98.1°F | Ht 63.0 in | Wt 177.0 lb

## 2015-03-01 DIAGNOSIS — I10 Essential (primary) hypertension: Secondary | ICD-10-CM | POA: Diagnosis not present

## 2015-03-01 DIAGNOSIS — M25512 Pain in left shoulder: Secondary | ICD-10-CM | POA: Diagnosis not present

## 2015-03-01 NOTE — Progress Notes (Signed)
Patient ID: Sheila Fernandez, female   DOB: 1950-12-26, 64 y.o.   MRN: [id number]  HPI:  F/u shoulder pain: Patient has been going to physical therapy for her left shoulder since February. Has noted improvement and reach and grasp since doing this. Still has pain. Has good strength against resistance and good range of motion. Has never had a steroid injection into her left shoulder. She would like to continue physical therapy and she feels that she is getting a lot of benefit from it. The physical therapist also reportedly thinks she can continue to improve.  HTN - didn't take medicine today, usually takes it in the morning. She takes metoprolol and amlodipine. Denies any chest pain. Endorses occasional shortness of breath if she exerts herself very heavily. Walks every day for 30 minutes without any shortness of breath. She's been trying to lose weight. Denies any persistent swelling. Eats healthy and has cut back on her salt intake. Eating fewer red meats.  ROS: See HPI.  [place]: History of left shoulder pain, hypertension, sickle cell trait  PHYSICAL EXAM: BP 153/95 mmHg  Pulse 64  Temp(Src) 98.1 F (36.7 C) (Oral)  Ht 5\' 3"  (1.6 m)  Wt 177 lb (80.287 kg)  BMI 31.36 kg/m2 Gen: No acute distress, pleasant, cooperative HEENT: Full range of motion of neck, Spurling's negative bilaterally Heart: Regular rate and rhythm, no murmur Lungs: Clear to auscultation bilaterally, normal respiratory effort Neuro: Grossly nonfocal, speech normal Ext: Left shoulder with full range of motion. No crepitus. Positive empty can and mildly positive neer. No appreciable lower extremity edema bilaterally  ASSESSMENT/PLAN:  Health maintenance:  -Gave handout on mammogram since patient is past due  Left shoulder pain History and exam consistent with rotator cuff tendinopathy. Does not seem to have any biceps tendinopathy today she is nontender over her bicep. She has received great benefit from physical  therapy and thus I recommended that she continue with that. She will follow-up with me in about a month, at which time we will decide on whether to try an injection of her left shoulder.   HYPERTENSION, BENIGN SYSTEMIC Blood pressure elevated but patient did not take medication today. She will return for fasting lab work. We will recheck her blood pressure when she follows up with me in one month.    FOLLOW UP: F/u in one month for shoulder and blood pressure.  [place]. Ardelia Mems, [place]

## 2015-03-01 NOTE — Patient Instructions (Signed)
Keep on with physical therapy See the handout on how to schedule your mammogram. This is an important test to screen for breast cancer. On your way out, schedule an appointment one morning to come back for fasting labs. Do not eat or drink anything other than water the morning of your lab appointment until after your labs are drawn.  Follow up with me in 1 month for BP and shoulder  Be well, Dr. Ardelia Mems

## 2015-03-02 ENCOUNTER — Ambulatory Visit: Payer: 59 | Attending: Family Medicine | Admitting: Physical Therapy

## 2015-03-02 ENCOUNTER — Other Ambulatory Visit: Payer: Self-pay

## 2015-03-02 DIAGNOSIS — M436 Torticollis: Secondary | ICD-10-CM | POA: Insufficient documentation

## 2015-03-02 DIAGNOSIS — M25512 Pain in left shoulder: Secondary | ICD-10-CM | POA: Diagnosis not present

## 2015-03-02 DIAGNOSIS — R29898 Other symptoms and signs involving the musculoskeletal system: Secondary | ICD-10-CM | POA: Insufficient documentation

## 2015-03-02 DIAGNOSIS — M67922 Unspecified disorder of synovium and tendon, left upper arm: Secondary | ICD-10-CM | POA: Insufficient documentation

## 2015-03-02 DIAGNOSIS — Z1231 Encounter for screening mammogram for malignant neoplasm of breast: Secondary | ICD-10-CM

## 2015-03-02 NOTE — Assessment & Plan Note (Signed)
Blood pressure elevated but patient did not take medication today. She will return for fasting lab work. We will recheck her blood pressure when she follows up with me in one month.

## 2015-03-02 NOTE — Therapy (Signed)
[place], [place], [address] Phone: [phone]   Fax:  [phone]  Physical Therapy Treatment  Patient Details  Name: Sheila Fernandez MRN: [id number] Date of Birth: Sep 21, 1951 Referring Provider:  Leeanne Rio, MD  Encounter Date: 03/02/2015      PT End of Session - 03/02/15 1013    Visit Number 17   Number of Visits 22   Date for PT Re-Evaluation 03/23/15   PT Start Time 0930   PT Stop Time 3016   PT Time Calculation (min) 53 min   Activity Tolerance Patient tolerated treatment well      Past Medical History  Diagnosis Date  . Adenomyosis   . Uterine fibroid   . Hypertension   . Iritis, recurrent   . Arthritis     Past Surgical History  Procedure Laterality Date  . Cesarean section    . Cholecystectomy    . Breast lumpectomy    . Foot surgery      There were no vitals filed for this visit.  Visit Diagnosis:  Pain in joint, shoulder region, left  Stiffness of neck      Subjective Assessment - 03/02/15 0936    Subjective The grip and ROM is better but the pain is not.  Dull achy pain is constant.     Currently in Pain? Yes   Pain Score 4    Pain Location Shoulder   Pain Orientation Left   Pain Descriptors / Indicators Aching;Dull   Pain Type Chronic pain   Pain Onset More than a month ago   Pain Frequency Intermittent   Aggravating Factors  lift something heavy; move quickly   Pain Relieving Factors needling 1x before            [facility] PT Assessment - 03/02/15 1010    AROM   Left Shoulder Flexion 153 Degrees   Left Shoulder ABduction 175 Degrees   Left Shoulder Internal Rotation 65 Degrees   Left Shoulder External Rotation 90 Degrees                     [facility] Adult PT Treatment/Exercise - 03/02/15 1011    Moist Heat Therapy   Number Minutes Moist Heat 12 Minutes   Moist Heat Location Shoulder;Cervical   Manual Therapy   Myofascial Release  left upper traps, levator scap,rhomboids, subscapularis, cervical multifidi left    Scapular Mobilization scapular distraction, medial/lateral sup/inferior grade 3   Other Manual Therapy contract relax left upper trap 3x 5 sec hold          Trigger Point Dry Needling - 03/02/15 1012    Consent Given? Yes   Education Handout Provided Yes  including signs and symptoms of pneumothorax   Muscles Treated Upper Body Upper trapezius;Levator scapulae;Rhomboids;Subscapularis;Infraspinatus  left cervical multifidi   Upper Trapezius Response Twitch reponse elicited;Palpable increased muscle length   Levator Scapulae Response Twitch response elicited;Palpable increased muscle length   Rhomboids Response Palpable increased muscle length   Infraspinatus Response Palpable increased muscle length   Subscapularis Response Palpable increased muscle length        Left side only.          PT Short Term Goals - 03/02/15 1016    PT SHORT TERM GOAL #1   Title "Independent with initial HEP   Status Achieved   PT SHORT TERM GOAL #2   Title Left shoulder flexion and abduction improved to 153 degrees needed for  household chores.   Status Achieved   PT SHORT TERM GOAL #3   Title Cervical sidebending improved to 25 degrees and cervical rotation to 45 degrees needed for home and work mobility   Status Achieved   PT SHORT TERM GOAL #4   Title Overall pain and function improved by 25%   Status Achieved           PT Long Term Goals - 03/02/15 1017    PT LONG TERM GOAL #1   Title "Pt will be independent with advanced HEP.    Time 8   Period Weeks   Status On-going   PT LONG TERM GOAL #2   Title The patient will have improved left UE strength in shoulder to 4-/5 needed for sweeping and mopping.   Status Achieved   PT LONG TERM GOAL #3   Title Left grip strength improved to 26# needed for lifting household items, dishes   Status Achieved   PT LONG TERM GOAL #4   Title Patient will report an  overall improvement in pain/function by 50%   Status Achieved   PT LONG TERM GOAL #5   Title Pain improved 50% or mroe (2/10 or less)   Time 3   Period Weeks   Status On-going               Plan - 03/02/15 1014    Clinical Impression Statement Multiple trigger points in left upper traps, rhomboids, levator, infraspinatus and subscapularis.  Improved muscle length following treatment interventions.  Therapist closely monitoring response throughout.   PT Next Visit Plan assess resonse to dry needling and manual techniques and continue as needed        Problem List Patient Active Problem List   Diagnosis Date Noted  . Biceps tendinopathy 08/15/2014  . Nausea alone 09/06/2013  . History of smoking 09/06/2013  . Chest pain 09/05/2013  . Dizziness - light-headed 04/29/2012  . Arthritis   . Chest pain 02/03/2011  . Iritis, recurrent 01/16/2011  . PLANTAR FASCIITIS, BILATERAL 08/15/2010  . COUGH, CHRONIC 08/15/2010  . FIBROIDS, UTERUS 12/21/2009  . ASCUS PAP 10/05/2009  . CERVICAL RADICULOPATHY 07/21/2007  . SICKLE CELL TRAIT 11/26/2006  . HYPERTENSION, BENIGN SYSTEMIC 11/26/2006    [name] 03/02/2015, 10:18 AM  [facility] [address] [place], [place], [address] Phone: [phone]   Fax:  [phone]  Ruben Im, PT 03/02/2015 10:18 AM Phone: [phone] Fax: [phone]

## 2015-03-02 NOTE — Assessment & Plan Note (Signed)
History and exam consistent with rotator cuff tendinopathy. Does not seem to have any biceps tendinopathy today she is nontender over her bicep. She has received great benefit from physical therapy and thus I recommended that she continue with that. She will follow-up with me in about a month, at which time we will decide on whether to try an injection of her left shoulder.

## 2015-03-06 ENCOUNTER — Other Ambulatory Visit: Payer: 59

## 2015-03-06 DIAGNOSIS — I10 Essential (primary) hypertension: Secondary | ICD-10-CM

## 2015-03-06 LAB — COMPREHENSIVE METABOLIC PANEL
ALT: 13 U/L (ref 0–35)
AST: 11 U/L (ref 0–37)
Albumin: 4 g/dL (ref 3.5–5.2)
Alkaline Phosphatase: 98 U/L (ref 39–117)
BUN: 16 mg/dL (ref 6–23)
CO2: 23 mEq/L (ref 19–32)
Calcium: 9.3 mg/dL (ref 8.4–10.5)
Chloride: 108 mEq/L (ref 96–112)
Creat: 0.71 mg/dL (ref 0.50–1.10)
Glucose, Bld: 108 mg/dL — ABNORMAL HIGH (ref 70–99)
Potassium: 3.8 mEq/L (ref 3.5–5.3)
Sodium: 139 mEq/L (ref 135–145)
Total Bilirubin: 0.7 mg/dL (ref 0.2–1.2)
Total Protein: 7.3 g/dL (ref 6.0–8.3)

## 2015-03-06 LAB — LIPID PANEL
Cholesterol: 149 mg/dL (ref 0–200)
HDL: 40 mg/dL — ABNORMAL LOW (ref >=46)
LDL Cholesterol: 85 mg/dL (ref 0–99)
Total CHOL/HDL Ratio: 3.7 Ratio
Triglycerides: 120 mg/dL (ref <150)
VLDL: 24 mg/dL (ref 0–40)

## 2015-03-06 NOTE — Progress Notes (Signed)
cmp and flp done today Sheila Fernandez 

## 2015-03-07 ENCOUNTER — Telehealth: Payer: Self-pay | Admitting: Family Medicine

## 2015-03-07 NOTE — Telephone Encounter (Signed)
Covering for Dr Ardelia Mems. Routine labwork showed 10 year ASCVD risk 11.2. If add that she is a smoker (she is a former smoker), jumps up to 21%. She would benefit from moderate-high intensity statin based on this. CMET normal. Will defer to her PCP to discuss statin initiation with patient as she returns next week.  Hilton Sinclair, MD

## 2015-03-08 ENCOUNTER — Ambulatory Visit: Payer: 59 | Admitting: Physical Therapy

## 2015-03-08 DIAGNOSIS — M25512 Pain in left shoulder: Secondary | ICD-10-CM

## 2015-03-08 DIAGNOSIS — M67922 Unspecified disorder of synovium and tendon, left upper arm: Secondary | ICD-10-CM

## 2015-03-08 DIAGNOSIS — M436 Torticollis: Secondary | ICD-10-CM

## 2015-03-08 DIAGNOSIS — R29898 Other symptoms and signs involving the musculoskeletal system: Secondary | ICD-10-CM

## 2015-03-08 NOTE — Therapy (Signed)
[place], [place], [address] Phone: [phone]   Fax:  [phone]  Physical Therapy Treatment  Patient Details  Name: Sheila Fernandez MRN: [id number] Date of Birth: 1951-02-11 Referring Provider:  Leeanne Rio, MD  Encounter Date: 03/08/2015      PT End of Session - 03/08/15 1548    Visit Number 18   Number of Visits 22   Date for PT Re-Evaluation 03/23/15   PT Start Time 1937   PT Stop Time 1555   PT Time Calculation (min) 60 min   Activity Tolerance Patient tolerated treatment well      Past Medical History  Diagnosis Date  . Adenomyosis   . Uterine fibroid   . Hypertension   . Iritis, recurrent   . Arthritis     Past Surgical History  Procedure Laterality Date  . Cesarean section    . Cholecystectomy    . Breast lumpectomy    . Foot surgery      There were no vitals filed for this visit.  Visit Diagnosis:  Pain in joint, shoulder region, left  Stiffness of neck  Biceps tendinopathy, left  Weakness of left arm      Subjective Assessment - 03/08/15 1457    Subjective I was really sore that day.  Patient unsure if helpful or not.     Currently in Pain? Yes   Pain Score 4    Pain Location Shoulder  upper arm and scapula   Pain Orientation Left   Pain Descriptors / Indicators Aching   Pain Type Chronic pain   Pain Frequency Constant   Aggravating Factors  lifting; move quickly but constant ache   Pain Relieving Factors heat and cold            [facility] PT Assessment - 03/08/15 1507    AROM   Left Shoulder Flexion 153 Degrees   Left Shoulder ABduction 175 Degrees                     [facility] Adult PT Treatment/Exercise - 03/08/15 0001    Shoulder Exercises: Supine   Flexion AAROM;Left;10 reps;20 reps  UE Ranger   Shoulder Exercises: Sidelying   Flexion AAROM;Left;10 reps  UE Ranger   Shoulder Exercises: Standing   Other Standing Exercises  UE Ranger on table top flexion 10x   Other Standing Exercises NMR supraspinatus on UE Ranger 10x2   Moist Heat Therapy   Number Minutes Moist Heat 10 Minutes   Moist Heat Location Shoulder;Cervical   Manual Therapy   Joint Mobilization Thoracic distraction seated grade 4 6x;seated thoracic extension mobs with movement  10x grade 3   Myofascial Release left upper traps, levator scap,rhomboids, subscapularis, cervical multifidi left    Scapular Mobilization scapular distraction, medial/lateral sup/inferior grade 3   Other Manual Therapy contract relax left upper trap 3x 5 sec hold          Trigger Point Dry Needling - 03/08/15 1502    Consent Given? Yes   Muscles Treated Upper Body Upper trapezius   Upper Trapezius Response Twitch reponse elicited;Palpable increased muscle length   Rhomboids Response Twitch response elicited;Palpable increased muscle length   Infraspinatus Response Palpable increased muscle length   Subscapularis Response Palpable increased muscle length       Left side only.         PT Short Term Goals - 03/08/15 1551    PT SHORT TERM GOAL #1  Title "Independent with initial HEP   Status Achieved   PT SHORT TERM GOAL #2   Title Left shoulder flexion and abduction improved to 153 degrees needed for household chores.   Status Achieved   PT SHORT TERM GOAL #3   Title Cervical sidebending improved to 25 degrees and cervical rotation to 45 degrees needed for home and work mobility   Status Achieved   PT SHORT TERM GOAL #4   Title Overall pain and function improved by 25%   Status Achieved           PT Long Term Goals - 03/08/15 1551    PT LONG TERM GOAL #1   Title "Pt will be independent with advanced HEP.    Time 8   Period Weeks   Status On-going   PT LONG TERM GOAL #2   Title The patient will have improved left UE strength in shoulder to 4-/5 needed for sweeping and mopping.   Status Achieved   PT LONG TERM GOAL #3   Title Left grip  strength improved to 26# needed for lifting household items, dishes   Status Achieved   PT LONG TERM GOAL #4   Title Patient will report an overall improvement in pain/function by 50%   Status Achieved   PT LONG TERM GOAL #5   Title Pain improved 50% or mroe (2/10 or less)   Time 3   Period Weeks   Status On-going               Plan - 03/08/15 1548    Clinical Impression Statement Patient with no major change in pain level, continues to complain of constant nagging pain.  If no improvement in next 1-2 visits will recommend discharge. Thoracic hypomobility.   Good shoulder AROM with some upper trap compensation.  Therapist closely monitoring pain response throughout.     PT Next Visit Plan assess resonse to dry needling and manual techniques and continue as needed; if no better, consider discharge.          Problem List Patient Active Problem List   Diagnosis Date Noted  . Left shoulder pain 03/02/2015  . Biceps tendinopathy 08/15/2014  . Nausea alone 09/06/2013  . History of smoking 09/06/2013  . Chest pain 09/05/2013  . Dizziness - light-headed 04/29/2012  . Arthritis   . Chest pain 02/03/2011  . Iritis, recurrent 01/16/2011  . PLANTAR FASCIITIS, BILATERAL 08/15/2010  . COUGH, CHRONIC 08/15/2010  . FIBROIDS, UTERUS 12/21/2009  . ASCUS PAP 10/05/2009  . CERVICAL RADICULOPATHY 07/21/2007  . SICKLE CELL TRAIT 11/26/2006  . HYPERTENSION, BENIGN SYSTEMIC 11/26/2006    [name] 03/08/2015, 4:15 PM  [facility] [address] [place], [place], [address] Phone: [phone]   Fax:  [phone]  Ruben Im, PT 03/08/2015 4:16 PM Phone: [phone] Fax: [phone]

## 2015-03-12 ENCOUNTER — Ambulatory Visit
Admission: RE | Admit: 2015-03-12 | Discharge: 2015-03-12 | Disposition: A | Discharge status: Home / Self Care | Payer: 59 | Source: Ambulatory Visit

## 2015-03-12 DIAGNOSIS — Z1231 Encounter for screening mammogram for malignant neoplasm of breast: Secondary | ICD-10-CM

## 2015-03-13 ENCOUNTER — Other Ambulatory Visit: Payer: Self-pay | Admitting: Family Medicine

## 2015-03-13 DIAGNOSIS — R928 Other abnormal and inconclusive findings on diagnostic imaging of breast: Secondary | ICD-10-CM

## 2015-03-15 ENCOUNTER — Ambulatory Visit: Payer: 59 | Admitting: Physical Therapy

## 2015-03-15 DIAGNOSIS — R29898 Other symptoms and signs involving the musculoskeletal system: Secondary | ICD-10-CM

## 2015-03-15 DIAGNOSIS — M436 Torticollis: Secondary | ICD-10-CM

## 2015-03-15 DIAGNOSIS — M25512 Pain in left shoulder: Secondary | ICD-10-CM

## 2015-03-15 DIAGNOSIS — M67922 Unspecified disorder of synovium and tendon, left upper arm: Secondary | ICD-10-CM

## 2015-03-15 NOTE — Therapy (Signed)
[place], [place], [address] Phone: [phone]   Fax:  [phone]  Physical Therapy Treatment  Patient Details  Name: Sheila Fernandez MRN: [id number] Date of Birth: [birth year] Referring Provider:  Leeanne Rio, MD  Encounter Date: 03/15/2015      PT End of Session - 03/15/15 1803    Visit Number 19   Number of Visits 22   Date for PT Re-Evaluation 03/23/15   PT Start Time 1500   PT Stop Time 1555   PT Time Calculation (min) 55 min   Activity Tolerance Patient tolerated treatment well      Past Medical History  Diagnosis Date  . Adenomyosis   . Uterine fibroid   . Hypertension   . Iritis, recurrent   . Arthritis     Past Surgical History  Procedure Laterality Date  . Cesarean section    . Cholecystectomy    . Breast lumpectomy    . Foot surgery      There were no vitals filed for this visit.  Visit Diagnosis:  Pain in joint, shoulder region, left  Stiffness of neck  Biceps tendinopathy, left  Weakness of left arm      Subjective Assessment - 03/15/15 1504    Subjective Really sore the next day but then better overall this week.  Awaiting MRI.   Currently in Pain? Yes   Pain Score 3    Pain Location Shoulder   Pain Orientation Left   Aggravating Factors  lift; move quickly   Pain Relieving Factors heat and cold                         [facility] Adult PT Treatment/Exercise - 03/15/15 1525    Shoulder Exercises: Supine   Other Supine Exercises supine red band scap stab series 10x each   Shoulder Exercises: Standing   Other Standing Exercises UE Ranger on table top flexion 10x;on table 10x,wall mount L14,16,18 10x each   Moist Heat Therapy   Number Minutes Moist Heat 10 Minutes   Moist Heat Location Shoulder;Cervical   Manual Therapy   Myofascial Release left upper traps, levator scap,rhomboids, subscapularis, cervical multifidi left            Trigger Point Dry Needling - 03/15/15 1529    Consent Given? Yes   Muscles Treated Upper Body Upper trapezius;Levator scapulae   Upper Trapezius Response Twitch reponse elicited;Palpable increased muscle length   Levator Scapulae Response Twitch response elicited;Palpable increased muscle length     Left only.           PT Short Term Goals - 03/15/15 1806    PT SHORT TERM GOAL #1   Title "Independent with initial HEP   Status Achieved   PT SHORT TERM GOAL #2   Title Left shoulder flexion and abduction improved to 153 degrees needed for household chores.   Status Achieved   PT SHORT TERM GOAL #3   Title Cervical sidebending improved to 25 degrees and cervical rotation to 45 degrees needed for home and work mobility   Status Achieved   PT SHORT TERM GOAL #4   Title Overall pain and function improved by 25%   Status Achieved           PT Long Term Goals - 03/15/15 1806    PT LONG TERM GOAL #1   Title "Pt will be independent with advanced HEP.    Time 8  Period Weeks   Status On-going   PT LONG TERM GOAL #2   Title The patient will have improved left UE strength in shoulder to 4-/5 needed for sweeping and mopping.   Status Achieved   PT LONG TERM GOAL #3   Title Left grip strength improved to 26# needed for lifting household items, dishes   Status Achieved   PT LONG TERM GOAL #4   Title Patient will report an overall improvement in pain/function by 50%   Status Achieved   PT LONG TERM GOAL #5   Title Pain improved 50% or mroe (2/10 or less)   Time 3   Period Weeks   Status On-going               Plan - 03/15/15 1803    Clinical Impression Statement Decreased trigger point size and number.  Improving ROM and strength noted.  Fewer compensatory shoulder shrugs.  Reassess next visit for progress toward visits, possible discharge.     PT Next Visit Plan recheck cervical and shoulder AROM and progress toward goals;  manual interventions as needed; probable  discharge        Problem List Patient Active Problem List   Diagnosis Date Noted  . Left shoulder pain 03/02/2015  . Biceps tendinopathy 08/15/2014  . Nausea alone 09/06/2013  . History of smoking 09/06/2013  . Chest pain 09/05/2013  . Dizziness - light-headed 04/29/2012  . Arthritis   . Chest pain 02/03/2011  . Iritis, recurrent 01/16/2011  . PLANTAR FASCIITIS, BILATERAL 08/15/2010  . COUGH, CHRONIC 08/15/2010  . FIBROIDS, UTERUS 12/21/2009  . ASCUS PAP 10/05/2009  . CERVICAL RADICULOPATHY 07/21/2007  . SICKLE CELL TRAIT 11/26/2006  . HYPERTENSION, BENIGN SYSTEMIC 11/26/2006    Sheila Fernandez 03/15/2015, 6:08 PM  [facility] [address] [place], [place], [address] Phone: [phone]   Fax:  [phone]  Ruben Im, PT 03/15/2015 6:09 PM Phone: [phone] Fax: [phone]

## 2015-03-21 ENCOUNTER — Ambulatory Visit
Admission: RE | Admit: 2015-03-21 | Discharge: 2015-03-21 | Disposition: A | Discharge status: Home / Self Care | Payer: 59 | Source: Ambulatory Visit | Attending: *Deleted | Admitting: *Deleted

## 2015-03-21 DIAGNOSIS — R928 Other abnormal and inconclusive findings on diagnostic imaging of breast: Secondary | ICD-10-CM

## 2015-03-22 ENCOUNTER — Ambulatory Visit: Payer: 59 | Admitting: Physical Therapy

## 2015-03-22 DIAGNOSIS — M436 Torticollis: Secondary | ICD-10-CM

## 2015-03-22 DIAGNOSIS — M67922 Unspecified disorder of synovium and tendon, left upper arm: Secondary | ICD-10-CM

## 2015-03-22 DIAGNOSIS — M25512 Pain in left shoulder: Secondary | ICD-10-CM

## 2015-03-22 DIAGNOSIS — R29898 Other symptoms and signs involving the musculoskeletal system: Secondary | ICD-10-CM

## 2015-03-22 NOTE — Therapy (Signed)
[place], [place], [address] Phone: [phone]   Fax:  [phone]  Physical Therapy Treatment/Discharge Summary  Patient Details  Name: Sheila Fernandez MRN: [id number] Date of Birth: [birth year] Referring Provider:  Leeanne Rio, MD  Encounter Date: 03/22/2015      PT End of Session - 03/22/15 1517    Visit Number 20   Number of Visits 22   Date for PT Re-Evaluation 03/23/15   PT Start Time 1505   PT Stop Time 1550   PT Time Calculation (min) 45 min   Activity Tolerance Patient tolerated treatment well      Past Medical History  Diagnosis Date  . Adenomyosis   . Uterine fibroid   . Hypertension   . Iritis, recurrent   . Arthritis     Past Surgical History  Procedure Laterality Date  . Cesarean section    . Cholecystectomy    . Breast lumpectomy    . Foot surgery      There were no vitals filed for this visit.  Visit Diagnosis:  Pain in joint, shoulder region, left  Stiffness of neck  Biceps tendinopathy, left  Weakness of left arm      Subjective Assessment - 03/22/15 1508    Subjective It's definately better than where I was.  Going to schedule MRI.  Overall 75% to 80 % better.  I can lift bags at the grocery store now.  I can reach higher into the cabinets now.   Currently in Pain? Yes   Pain Score 3    Pain Orientation Left   Pain Type Chronic pain   Aggravating Factors  quick movements            [facility] PT Assessment - 03/22/15 1512    Observation/Other Assessments   Quick DASH  22.5   ROM / Strength   AROM / PROM / Strength Strength   AROM   Left Shoulder Flexion 165 Degrees   Left Shoulder ABduction 170 Degrees   Left Shoulder Internal Rotation --  behind back to T10   Left Shoulder External Rotation 90 Degrees   Cervical - Right Side Bend 40   Cervical - Left Side Bend 50                     [facility] Adult PT Treatment/Exercise -  03/22/15 1542    Moist Heat Therapy   Number Minutes Moist Heat 15 Minutes   Moist Heat Location Shoulder;Cervical                PT Education - 03/22/15 1542    Education provided Yes   Education Details HEP safe self progression   Person(s) Educated Patient   Methods Explanation   Comprehension Verbalized understanding          PT Short Term Goals - 03/22/15 1516    PT SHORT TERM GOAL #1   Title "Independent with initial HEP   Status Achieved   PT SHORT TERM GOAL #2   Title Left shoulder flexion and abduction improved to 153 degrees needed for household chores.   Status Achieved   PT SHORT TERM GOAL #3   Title Cervical sidebending improved to 25 degrees and cervical rotation to 45 degrees needed for home and work mobility   Status Achieved   PT SHORT TERM GOAL #4   Title Overall pain and function improved by 25%   Status Achieved  PT Long Term Goals - 03/22/15 1516    PT LONG TERM GOAL #1   Title "Pt will be independent with advanced HEP.    Status Achieved   PT LONG TERM GOAL #2   Title The patient will have improved left UE strength in shoulder to 4-/5 needed for sweeping and mopping.   Status Achieved   PT LONG TERM GOAL #3   Title Left grip strength improved to 26# needed for lifting household items, dishes   Status Achieved   PT LONG TERM GOAL #4   Title Patient will report an overall improvement in pain/function by 50%   Status Achieved   PT LONG TERM GOAL #5   Title Pain improved 50% or mroe (2/10 or less)   Status Achieved               Plan - 03/22/15 1537    Clinical Impression Statement The patient has met all short term and long term goals.  Excellent improvement in left shoulder AROM to [facility] and strength to grossly 4 to 4+/5.  Left grip strength improved from 16# initially to 40# presently.  Negative upper limb tension test.  Quick DASH score improved from 34.1 to 22.7.  Patient's overall improvement at 75-80%.  All goals  met, will discharge from PT at this time to independent HEP.        Problem List Patient Active Problem List   Diagnosis Date Noted  . Left shoulder pain 03/02/2015  . Biceps tendinopathy 08/15/2014  . Nausea alone 09/06/2013  . History of smoking 09/06/2013  . Chest pain 09/05/2013  . Dizziness - light-headed 04/29/2012  . Arthritis   . Chest pain 02/03/2011  . Iritis, recurrent 01/16/2011  . PLANTAR FASCIITIS, BILATERAL 08/15/2010  . COUGH, CHRONIC 08/15/2010  . FIBROIDS, UTERUS 12/21/2009  . ASCUS PAP 10/05/2009  . CERVICAL RADICULOPATHY 07/21/2007  . SICKLE CELL TRAIT 11/26/2006  . HYPERTENSION, BENIGN SYSTEMIC 11/26/2006    [name] 03/22/2015, 3:43 PM  [facility] [address] [place], [place], [address] Phone: [phone]   Fax:  [phone]  PHYSICAL THERAPY DISCHARGE SUMMARY  Visits from Start of Care: 20  Current functional level related to goals / functional outcomes: All goals met.  See clinical impressions above.   Remaining deficits: Mild left UE strength deficits should improve with continuation of HEP   Education / Equipment: HEP Plan: Patient agrees to discharge.  Patient goals were met. Patient is being discharged due to meeting the stated rehab goals.  ?????  Ruben Im, PT 03/22/2015 3:45 PM Phone: [phone] Fax: [phone]

## 2015-03-22 NOTE — Patient Instructions (Signed)
It is important to continue with HEP for maintenance of ROM and strength gains.

## 2015-03-23 ENCOUNTER — Telehealth: Payer: Self-pay | Admitting: Family Medicine

## 2015-03-23 ENCOUNTER — Encounter: Payer: Self-pay | Admitting: Family Medicine

## 2015-03-23 NOTE — Telephone Encounter (Signed)
Attempted to reach pt to discuss cholesterol results, no answer. Will send letter with cholesterol results asking she schedule follow up visit so we can discuss. Will likely recommend statin to patient.  Leeanne Rio, MD

## 2015-03-28 ENCOUNTER — Other Ambulatory Visit: Payer: Self-pay | Admitting: *Deleted

## 2015-03-28 DIAGNOSIS — I10 Essential (primary) hypertension: Secondary | ICD-10-CM

## 2015-03-29 ENCOUNTER — Ambulatory Visit: Payer: 59 | Admitting: Physical Therapy

## 2015-03-29 MED ORDER — METOPROLOL TARTRATE 100 MG PO TABS: 50.0000 mg | ORAL_TABLET | Freq: Two times a day (BID) | ORAL | Status: DC

## 2015-03-29 MED ORDER — AMLODIPINE BESYLATE 10 MG PO TABS: 10.0000 mg | ORAL_TABLET | Freq: Every morning | ORAL | Status: DC

## 2015-04-05 ENCOUNTER — Ambulatory Visit: Payer: 59 | Admitting: Physical Therapy

## 2015-04-12 ENCOUNTER — Ambulatory Visit: Payer: 59 | Admitting: Physical Therapy

## 2015-04-16 ENCOUNTER — Encounter: Payer: Self-pay | Admitting: Family Medicine

## 2015-04-16 ENCOUNTER — Ambulatory Visit (INDEPENDENT_AMBULATORY_CARE_PROVIDER_SITE_OTHER): Payer: 59 | Admitting: Family Medicine

## 2015-04-16 VITALS — BP 130/65 | HR 103 | Temp 98.3°F | Ht 63.0 in | Wt 175.0 lb

## 2015-04-16 DIAGNOSIS — I10 Essential (primary) hypertension: Secondary | ICD-10-CM | POA: Diagnosis not present

## 2015-04-16 DIAGNOSIS — R739 Hyperglycemia, unspecified: Secondary | ICD-10-CM | POA: Diagnosis not present

## 2015-04-16 DIAGNOSIS — M25512 Pain in left shoulder: Secondary | ICD-10-CM

## 2015-04-16 LAB — POCT GLYCOSYLATED HEMOGLOBIN (HGB A1C): Hemoglobin A1C: 5.5

## 2015-04-16 NOTE — Patient Instructions (Signed)
Return in 3 months for your BP See handouts on shingles shot and tetanus vaccine Checking for diabetes today  Be well, Dr. Ardelia Mems

## 2015-04-16 NOTE — Assessment & Plan Note (Addendum)
Well controlled. Continue current regimen. Discussed recommendation for statin with patient but she would prefer to continue with lifestyle changes. Check A1c today as glucose mildly elevated on fasting lab draw. She will f/u with me in 3 mos for her BP.

## 2015-04-16 NOTE — Progress Notes (Signed)
Patient ID: Sheila Fernandez, female   DOB: 08/08/1951, 64 y.o.   MRN: [id number]  HPI:  F/u HTN - currently taking metoprolol 50mg  BID and amlodipine 10mg  daily. Doing well. Denies chest pain. Able to walk 20 minutes before getting SOB, at which time she rests for 5 minutes then can keep going.  Lipid results - discussed recommendation for statin with patient based on ASCVD score. She has been working on healthy eating (decreased salt, sugar intake) and prefers not to start statin for now.  L shoulder pain - recent MRI with orthopedic office. Doing well, has been discharged from PT. Not interested in shoulder injection today  ROS: See HPI.  [place]: hx arthritis, HTN, sickle cell trait, iritis  PHYSICAL EXAM: BP 130/65 mmHg  Pulse 103  Temp(Src) 98.3 F (36.8 C) (Oral)  Ht 5\' 3"  (1.6 m)  Wt 175 lb (79.379 kg)  BMI 31.01 kg/m2 Gen: NAD HEENT: NCAT Heart: RRR no murmur Lungs:CTAB NWOB Neuro: grossly nonfocal speech normal Ext: No appreciable lower extremity edema bilaterally   ASSESSMENT/PLAN:  Health maintenance:  -handout given on shingles & tetanus vaccines today  HYPERTENSION, BENIGN SYSTEMIC Well controlled. Continue current regimen. Discussed recommendation for statin with patient but she would prefer to continue with lifestyle changes. Check A1c today as glucose mildly elevated on fasting lab draw. She will f/u with me in 3 mos for her BP.  Left shoulder pain Continues to follow with orthopedics. Advised we can consider steroid injection in future if desired.   FOLLOW UP: F/u in 3 mos for HTN  Sheila Fernandez, [place]

## 2015-04-16 NOTE — Assessment & Plan Note (Signed)
Continues to follow with orthopedics. Advised we can consider steroid injection in future if desired.

## 2015-04-17 ENCOUNTER — Encounter: Payer: Self-pay | Admitting: Family Medicine

## 2015-05-25 ENCOUNTER — Encounter: Payer: Self-pay | Admitting: Gastroenterology

## 2015-12-12 DIAGNOSIS — I10 Essential (primary) hypertension: Secondary | ICD-10-CM | POA: Diagnosis not present

## 2015-12-12 DIAGNOSIS — H209 Unspecified iridocyclitis: Secondary | ICD-10-CM | POA: Diagnosis not present

## 2015-12-12 DIAGNOSIS — Z0001 Encounter for general adult medical examination with abnormal findings: Secondary | ICD-10-CM | POA: Diagnosis not present

## 2015-12-12 DIAGNOSIS — Z1389 Encounter for screening for other disorder: Secondary | ICD-10-CM | POA: Diagnosis not present

## 2016-01-23 DIAGNOSIS — R739 Hyperglycemia, unspecified: Secondary | ICD-10-CM | POA: Diagnosis not present

## 2016-01-23 DIAGNOSIS — N390 Urinary tract infection, site not specified: Secondary | ICD-10-CM | POA: Diagnosis not present

## 2016-01-23 DIAGNOSIS — Z780 Asymptomatic menopausal state: Secondary | ICD-10-CM | POA: Diagnosis not present

## 2016-01-23 DIAGNOSIS — I10 Essential (primary) hypertension: Secondary | ICD-10-CM | POA: Diagnosis not present

## 2016-01-23 DIAGNOSIS — Z0001 Encounter for general adult medical examination with abnormal findings: Secondary | ICD-10-CM | POA: Diagnosis not present

## 2016-01-30 ENCOUNTER — Other Ambulatory Visit: Payer: Self-pay | Admitting: Family Medicine

## 2016-01-30 NOTE — Telephone Encounter (Signed)
Rx filled. Patient needs appointment for further refills.  Algis Greenhouse. Jerline Pain, [facility] Resident PGY-2 01/30/2016 8:32 AM

## 2016-01-30 NOTE — Telephone Encounter (Signed)
Attempted to call pt, the number in chart is not a working number at this time.

## 2016-07-28 ENCOUNTER — Emergency Department (HOSPITAL_COMMUNITY): Payer: No Typology Code available for payment source

## 2016-07-28 ENCOUNTER — Emergency Department (HOSPITAL_COMMUNITY)
Admission: EM | Admit: 2016-07-28 | Discharge: 2016-07-28 | Disposition: A | Discharge status: Home / Self Care | Payer: No Typology Code available for payment source | Attending: Emergency Medicine | Admitting: Emergency Medicine

## 2016-07-28 ENCOUNTER — Encounter (HOSPITAL_COMMUNITY): Payer: Self-pay | Admitting: Emergency Medicine

## 2016-07-28 DIAGNOSIS — I10 Essential (primary) hypertension: Secondary | ICD-10-CM | POA: Insufficient documentation

## 2016-07-28 DIAGNOSIS — Z7982 Long term (current) use of aspirin: Secondary | ICD-10-CM | POA: Insufficient documentation

## 2016-07-28 DIAGNOSIS — Z87891 Personal history of nicotine dependence: Secondary | ICD-10-CM | POA: Diagnosis not present

## 2016-07-28 DIAGNOSIS — M7918 Myalgia, other site: Secondary | ICD-10-CM

## 2016-07-28 DIAGNOSIS — Z79899 Other long term (current) drug therapy: Secondary | ICD-10-CM | POA: Insufficient documentation

## 2016-07-28 DIAGNOSIS — Y999 Unspecified external cause status: Secondary | ICD-10-CM | POA: Diagnosis not present

## 2016-07-28 DIAGNOSIS — Y939 Activity, unspecified: Secondary | ICD-10-CM | POA: Insufficient documentation

## 2016-07-28 DIAGNOSIS — M791 Myalgia: Secondary | ICD-10-CM | POA: Diagnosis not present

## 2016-07-28 DIAGNOSIS — M546 Pain in thoracic spine: Secondary | ICD-10-CM | POA: Diagnosis not present

## 2016-07-28 DIAGNOSIS — S299XXA Unspecified injury of thorax, initial encounter: Secondary | ICD-10-CM | POA: Diagnosis not present

## 2016-07-28 DIAGNOSIS — Y92410 Unspecified street and highway as the place of occurrence of the external cause: Secondary | ICD-10-CM | POA: Diagnosis not present

## 2016-07-28 DIAGNOSIS — M5489 Other dorsalgia: Secondary | ICD-10-CM | POA: Diagnosis not present

## 2016-07-28 DIAGNOSIS — T148XXA Other injury of unspecified body region, initial encounter: Secondary | ICD-10-CM | POA: Diagnosis not present

## 2016-07-28 DIAGNOSIS — M549 Dorsalgia, unspecified: Secondary | ICD-10-CM | POA: Diagnosis present

## 2016-07-28 MED ORDER — METHOCARBAMOL 500 MG PO TABS: 500.0000 mg | ORAL_TABLET | Freq: Two times a day (BID) | ORAL | 0 refills | Status: DC

## 2016-07-28 MED ORDER — METHOCARBAMOL 500 MG PO TABS
500.0000 mg | ORAL_TABLET | Freq: Once | ORAL | Status: AC
  Administered 2016-07-28: 500 mg via ORAL
  Filled 2016-07-28: qty 1
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
## 2016-08-04 DIAGNOSIS — M549 Dorsalgia, unspecified: Secondary | ICD-10-CM | POA: Diagnosis not present

## 2016-08-04 DIAGNOSIS — I10 Essential (primary) hypertension: Secondary | ICD-10-CM | POA: Diagnosis not present

## 2016-08-04 DIAGNOSIS — M542 Cervicalgia: Secondary | ICD-10-CM | POA: Diagnosis not present

## 2016-09-01 DIAGNOSIS — I10 Essential (primary) hypertension: Secondary | ICD-10-CM | POA: Diagnosis not present

## 2016-09-05 ENCOUNTER — Other Ambulatory Visit: Payer: Self-pay | Admitting: Family Medicine

## 2016-09-05 NOTE — Telephone Encounter (Signed)
Patient has not been seen here in over a year. She needs to schedule a follow up appointment.  Algis Greenhouse. Jerline Pain, [facility] Resident PGY-3 09/05/2016 8:58 AM

## 2016-09-05 NOTE — Telephone Encounter (Signed)
Left voicemail for patient to call office back. Will try calling patient again. If she calls in please schedule for an appointment per pcp. Thanks. Nat Christen, CMA

## 2016-09-12 ENCOUNTER — Other Ambulatory Visit: Payer: Self-pay | Admitting: Internal Medicine

## 2016-09-12 DIAGNOSIS — Z1231 Encounter for screening mammogram for malignant neoplasm of breast: Secondary | ICD-10-CM

## 2016-10-16 ENCOUNTER — Ambulatory Visit: Payer: Self-pay

## 2016-11-03 DIAGNOSIS — M542 Cervicalgia: Secondary | ICD-10-CM | POA: Diagnosis not present

## 2016-11-05 DIAGNOSIS — M542 Cervicalgia: Secondary | ICD-10-CM | POA: Diagnosis not present

## 2016-11-07 ENCOUNTER — Ambulatory Visit
Admission: RE | Admit: 2016-11-07 | Discharge: 2016-11-07 | Disposition: A | Discharge status: Home / Self Care | Payer: Medicare Other | Source: Ambulatory Visit | Attending: Internal Medicine | Admitting: Internal Medicine

## 2016-11-07 DIAGNOSIS — Z1231 Encounter for screening mammogram for malignant neoplasm of breast: Secondary | ICD-10-CM

## 2016-11-07 DIAGNOSIS — M542 Cervicalgia: Secondary | ICD-10-CM | POA: Diagnosis not present

## 2016-11-10 DIAGNOSIS — M542 Cervicalgia: Secondary | ICD-10-CM | POA: Diagnosis not present

## 2016-11-12 DIAGNOSIS — M542 Cervicalgia: Secondary | ICD-10-CM | POA: Diagnosis not present

## 2016-11-14 DIAGNOSIS — M542 Cervicalgia: Secondary | ICD-10-CM | POA: Diagnosis not present

## 2016-11-17 DIAGNOSIS — M542 Cervicalgia: Secondary | ICD-10-CM | POA: Diagnosis not present

## 2016-11-20 DIAGNOSIS — M542 Cervicalgia: Secondary | ICD-10-CM | POA: Diagnosis not present

## 2016-11-24 DIAGNOSIS — M542 Cervicalgia: Secondary | ICD-10-CM | POA: Diagnosis not present

## 2016-12-02 DIAGNOSIS — M542 Cervicalgia: Secondary | ICD-10-CM | POA: Diagnosis not present

## 2016-12-04 DIAGNOSIS — M542 Cervicalgia: Secondary | ICD-10-CM | POA: Diagnosis not present

## 2016-12-09 DIAGNOSIS — M542 Cervicalgia: Secondary | ICD-10-CM | POA: Diagnosis not present

## 2016-12-11 DIAGNOSIS — M542 Cervicalgia: Secondary | ICD-10-CM | POA: Diagnosis not present

## 2016-12-15 DIAGNOSIS — M542 Cervicalgia: Secondary | ICD-10-CM | POA: Diagnosis not present

## 2016-12-18 DIAGNOSIS — M542 Cervicalgia: Secondary | ICD-10-CM | POA: Diagnosis not present

## 2016-12-23 DIAGNOSIS — M542 Cervicalgia: Secondary | ICD-10-CM | POA: Diagnosis not present

## 2016-12-25 DIAGNOSIS — M542 Cervicalgia: Secondary | ICD-10-CM | POA: Diagnosis not present

## 2017-03-04 DIAGNOSIS — M810 Age-related osteoporosis without current pathological fracture: Secondary | ICD-10-CM | POA: Diagnosis not present

## 2017-04-09 DIAGNOSIS — M791 Myalgia: Secondary | ICD-10-CM | POA: Diagnosis not present

## 2017-04-09 DIAGNOSIS — J209 Acute bronchitis, unspecified: Secondary | ICD-10-CM | POA: Diagnosis not present

## 2017-04-09 DIAGNOSIS — Z0000 Encounter for general adult medical examination without abnormal findings: Secondary | ICD-10-CM | POA: Diagnosis not present

## 2017-04-09 DIAGNOSIS — R05 Cough: Secondary | ICD-10-CM | POA: Diagnosis not present

## 2017-04-09 DIAGNOSIS — H209 Unspecified iridocyclitis: Secondary | ICD-10-CM | POA: Diagnosis not present

## 2017-04-13 DIAGNOSIS — I10 Essential (primary) hypertension: Secondary | ICD-10-CM | POA: Diagnosis not present

## 2017-04-13 DIAGNOSIS — R739 Hyperglycemia, unspecified: Secondary | ICD-10-CM | POA: Diagnosis not present

## 2017-04-13 DIAGNOSIS — D649 Anemia, unspecified: Secondary | ICD-10-CM | POA: Diagnosis not present

## 2017-04-13 DIAGNOSIS — Z0000 Encounter for general adult medical examination without abnormal findings: Secondary | ICD-10-CM | POA: Diagnosis not present

## 2017-04-24 DIAGNOSIS — M1711 Unilateral primary osteoarthritis, right knee: Secondary | ICD-10-CM | POA: Diagnosis not present

## 2017-04-24 DIAGNOSIS — M2550 Pain in unspecified joint: Secondary | ICD-10-CM | POA: Diagnosis not present

## 2017-04-24 DIAGNOSIS — M1712 Unilateral primary osteoarthritis, left knee: Secondary | ICD-10-CM | POA: Diagnosis not present

## 2017-04-24 DIAGNOSIS — M1990 Unspecified osteoarthritis, unspecified site: Secondary | ICD-10-CM | POA: Diagnosis not present

## 2017-04-24 DIAGNOSIS — H209 Unspecified iridocyclitis: Secondary | ICD-10-CM | POA: Diagnosis not present

## 2017-04-24 DIAGNOSIS — M25569 Pain in unspecified knee: Secondary | ICD-10-CM | POA: Diagnosis not present

## 2017-05-04 ENCOUNTER — Other Ambulatory Visit: Payer: Self-pay | Admitting: Family Medicine

## 2017-05-06 DIAGNOSIS — R252 Cramp and spasm: Secondary | ICD-10-CM | POA: Diagnosis not present

## 2017-05-06 DIAGNOSIS — Z136 Encounter for screening for cardiovascular disorders: Secondary | ICD-10-CM | POA: Diagnosis not present

## 2017-05-08 DIAGNOSIS — D472 Monoclonal gammopathy: Secondary | ICD-10-CM | POA: Diagnosis not present

## 2017-05-08 DIAGNOSIS — R05 Cough: Secondary | ICD-10-CM | POA: Diagnosis not present

## 2017-05-27 ENCOUNTER — Encounter: Payer: Self-pay | Admitting: Hematology

## 2017-05-27 ENCOUNTER — Telehealth: Payer: Self-pay | Admitting: Hematology

## 2017-05-27 NOTE — Telephone Encounter (Signed)
Appt has been scheduled for the pt to see Dr. Irene Limbo on 9/12 at 11am. Appt scheduled with Fraser Din from the referring will notify the pt. Letter mailed.

## 2017-05-27 NOTE — Telephone Encounter (Signed)
Appt has been scheduled for the pt to see Dr. Irene Limbo on 9/12 at 11am. Fraser Din from the referring office will notify the pt. Letter mailed to the pt.

## 2017-06-09 ENCOUNTER — Encounter: Payer: Self-pay | Admitting: Internal Medicine

## 2017-06-09 ENCOUNTER — Ambulatory Visit (INDEPENDENT_AMBULATORY_CARE_PROVIDER_SITE_OTHER): Payer: Medicare Other | Admitting: Internal Medicine

## 2017-06-09 VITALS — BP 132/72 | HR 72 | Ht 62.0 in | Wt 165.0 lb

## 2017-06-09 DIAGNOSIS — R05 Cough: Secondary | ICD-10-CM

## 2017-06-09 DIAGNOSIS — R053 Chronic cough: Secondary | ICD-10-CM

## 2017-06-09 DIAGNOSIS — R0602 Shortness of breath: Secondary | ICD-10-CM | POA: Diagnosis not present

## 2017-06-09 LAB — NITRIC OXIDE: Nitric Oxide: 14
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
## 2017-06-09 NOTE — Addendum Note (Signed)
Addended by: Della Goo C on: 06/09/2017 12:09 PM   Modules accepted: Orders

## 2017-06-09 NOTE — Patient Instructions (Addendum)
ICD-10-CM   1. Chronic cough R05     unexplained chronic refractory cough  doubt this is asthma because of normal exhaled nitric oxidetest today   plan -  Full pulmonary function test -  High-resolution CT chest without contrast -  CT sinus without contrast   follow-up -  Return to see either myself or my nurse practitioner but after completing the

## 2017-06-09 NOTE — Addendum Note (Signed)
Addended by: Della Goo C on: 06/09/2017 12:06 PM   Modules accepted: Orders

## 2017-06-10 ENCOUNTER — Telehealth: Payer: Self-pay

## 2017-06-10 ENCOUNTER — Telehealth: Payer: Self-pay | Admitting: Hematology

## 2017-06-10 ENCOUNTER — Ambulatory Visit (HOSPITAL_BASED_OUTPATIENT_CLINIC_OR_DEPARTMENT_OTHER): Payer: Medicare Other

## 2017-06-10 ENCOUNTER — Encounter: Payer: Self-pay | Admitting: Hematology

## 2017-06-10 ENCOUNTER — Ambulatory Visit (HOSPITAL_BASED_OUTPATIENT_CLINIC_OR_DEPARTMENT_OTHER): Payer: Medicare Other | Admitting: Hematology

## 2017-06-10 VITALS — BP 119/74 | HR 63 | Temp 98.5°F | Resp 24 | Ht 62.0 in | Wt 164.7 lb

## 2017-06-10 DIAGNOSIS — D472 Monoclonal gammopathy: Secondary | ICD-10-CM | POA: Diagnosis not present

## 2017-06-10 DIAGNOSIS — D649 Anemia, unspecified: Secondary | ICD-10-CM

## 2017-06-10 LAB — COMPREHENSIVE METABOLIC PANEL
ALT: 21 U/L (ref 0–55)
AST: 17 U/L (ref 5–34)
Albumin: 4.2 g/dL (ref 3.5–5.0)
Alkaline Phosphatase: 88 U/L (ref 40–150)
Anion Gap: 9 mEq/L (ref 3–11)
BUN: 18.5 mg/dL (ref 7.0–26.0)
CO2: 23 mEq/L (ref 22–29)
Calcium: 9.8 mg/dL (ref 8.4–10.4)
Chloride: 106 mEq/L (ref 98–109)
Creatinine: 0.8 mg/dL (ref 0.6–1.1)
EGFR: 86 mL/min/{1.73_m2} — ABNORMAL LOW (ref >90)
Glucose: 93 mg/dl (ref 70–140)
Potassium: 4 mEq/L (ref 3.5–5.1)
Sodium: 138 mEq/L (ref 136–145)
Total Bilirubin: 1.29 mg/dL — ABNORMAL HIGH (ref 0.20–1.20)
Total Protein: 7.6 g/dL (ref 6.4–8.3)

## 2017-06-10 LAB — CBC & DIFF AND RETIC
BASO%: 0.9 % (ref 0.0–2.0)
Basophils Absolute: 0 10*3/uL (ref 0.0–0.1)
EOS%: 3.9 % (ref 0.0–7.0)
Eosinophils Absolute: 0.2 10*3/uL (ref 0.0–0.5)
HCT: 36.7 % (ref 34.8–46.6)
HGB: 12.1 g/dL (ref 11.6–15.9)
Immature Retic Fract: 6.6 % (ref 1.60–10.00)
LYMPH%: 49.5 % (ref 14.0–49.7)
MCH: 27.3 pg (ref 25.1–34.0)
MCHC: 33 g/dL (ref 31.5–36.0)
MCV: 82.7 fL (ref 79.5–101.0)
MONO#: 0.2 10*3/uL (ref 0.1–0.9)
MONO%: 3.9 % (ref 0.0–14.0)
NEUT#: 1.9 10*3/uL (ref 1.5–6.5)
NEUT%: 41.8 % (ref 38.4–76.8)
Platelets: 227 10*3/uL (ref 145–400)
RBC: 4.44 10*6/uL (ref 3.70–5.45)
RDW: 15.8 % — ABNORMAL HIGH (ref 11.2–14.5)
Retic %: 1.23 % (ref 0.70–2.10)
Retic Ct Abs: 54.61 10*3/uL (ref 33.70–90.70)
WBC: 4.6 10*3/uL (ref 3.9–10.3)
lymph#: 2.3 10*3/uL (ref 0.9–3.3)
nRBC: 0 % (ref 0–0)

## 2017-06-10 LAB — IRON AND TIBC
%SAT: 37 % (ref 21–57)
Iron: 106 ug/dL (ref 41–142)
TIBC: 286 ug/dL (ref 236–444)
UIBC: 180 ug/dL (ref 120–384)

## 2017-06-10 LAB — LACTATE DEHYDROGENASE: LDH: 201 U/L (ref 125–245)

## 2017-06-10 LAB — FERRITIN: Ferritin: 166 ng/ml (ref 9–269)

## 2017-06-10 NOTE — Telephone Encounter (Signed)
Our office has not yet received documentation of m spike in urine from [facility]. Called office and left message with Network engineer. Was told that someone from Dr. Julianne Rice office would call me back. Fax number was not requested.

## 2017-06-10 NOTE — Telephone Encounter (Signed)
Scheduled appt per 9/12 los - Gave patient AVS and calender per los. - no Skeletal survey order in.

## 2017-06-10 NOTE — Progress Notes (Signed)
CONSULT NOTE  Patient Care Team: Marjie Skiff, MD as PCP - General (Family Medicine)  CHIEF COMPLAINTS/PURPOSE OF CONSULTATION:  Anemia and MGUS  HISTORY OF PRESENTING ILLNESS:   Sheila Fernandez 66 y.o. 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   She is unsure if this was taken through urine or blood, we will request for her lab work to be sent to our clinic as they were not sent yet. Her recurrent iritis workup has overall remained unrevealing with obvious etiology as per patient.  Also, she was seen by a Rheumatologist recently for ongoing chronic knee pain. Following workup this was thought to be due to normal wear and tear and possible OA.  Additionally, she was seen by Dr Einar Gip for muscular cramps to her lower legs. These have been improving and her workup has not been concerning thus far.   She was also seen by Dr Chase Caller yesterday for ongoing chronic cough. She has follow-up with him tomorrow for ongoing lung function testing and next week again for CT chest.   Overall she is doing well. She feels normal and close to her baseline. She does note some mild, intermittent headaches, but no other concerning symptoms with this. She also reports some just left of center upper back pain which has been ongoing and new for ~2 weeks. She does try to remain active in her daily life, going on walks everyday and remaining involved with her church. She has lost some weight, ~7-8lbs over two months, but she reports that she has been exercising more frequently and has been trying to eat healthier.   She denies recent chest pain on exertion, shortness of breath on minimal exertion, pre-syncopal episodes, or palpitations. She had not noticed any recent bleeding such as epistaxis, hematuria or hematochezia The patient denies over the counter  NSAID ingestion. She is not on antiplatelets agents. Her last colonoscopy was in 2011. She had no prior history or diagnosis of cancer. She denies any pica and eats a variety of diet. She never donated blood or received blood transfusion She does reports a h/o heavy periods with prior known anemia. She has previously been on oral iron supplements, but has never received IV iron.   MEDICAL HISTORY:  Past Medical History:  Diagnosis Date  . Adenomyosis   . Arthritis   . Hypertension   . Iritis, recurrent   . Uterine fibroid     SURGICAL HISTORY: Past Surgical History:  Procedure Laterality Date  . BREAST BIOPSY  1986   benign excisional  . BREAST LUMPECTOMY    . CESAREAN SECTION    . CHOLECYSTECTOMY    . FOOT SURGERY      SOCIAL HISTORY: Social History   Social History  . Marital status: Divorced    Spouse name: N/A  . Number of children: N/A  . Years of education: N/A   Occupational History  . Not on file.   Social History Main Topics  . Smoking status: Former Smoker    Quit date: 11/28/2003  . Smokeless tobacco: Never Used  . Alcohol use No  . Drug use: No  . Sexual activity: Not Currently    Birth control/ protection: None   Other Topics Concern  . Not on file   Social History Narrative  . No narrative on file  FAMILY HISTORY: Family History  Problem Relation Age of Onset  . Cancer Mother 21       Pancreatic  . Hypertension Mother   . Stroke Father   . Cancer Father 68       Prostate  . Hypertension Father   . Prostate cancer Father   . Cancer Sister 1       2 sisters d/c cancer less than 39yo  . Diabetes Sister   . Hypertension Sister   . Diabetes Brother   . Hypertension Brother   . Prostate cancer Brother   . Diabetes Daughter   . Early death Daughter   . Hypertension Daughter   . Kidney disease Daughter     ALLERGIES:  is allergic to codeine.  MEDICATIONS:  Current Outpatient Prescriptions  Medication Sig Dispense Refill  .  amLODipine (NORVASC) 10 MG tablet TAKE ONE TABLET BY MOUTH ONCE DAILY IN THE MORNING 90 tablet 0  . aspirin 81 MG chewable tablet Chew 1 tablet (81 mg total) by mouth once. (Patient not taking: Reported on 06/10/2017)    . carvedilol (COREG) 25 MG tablet Take 25 mg by mouth 2 (two) times daily with a meal.    . Cholecalciferol (VITAMIN D PO) Take 1 tablet by mouth daily.    [name] MAGNESIUM PO Take 1 tablet by mouth daily.    . nitroGLYCERIN (NITROSTAT) 0.4 MG SL tablet Place 1 tablet (0.4 mg total) under the tongue every 5 (five) minutes as needed. Call doctor if single tab used (Patient not taking: Reported on 06/10/2017) 20 tablet 1  . OVER THE COUNTER MEDICATION Take 1 capsule by mouth daily. Carida 7 - [employer]    . prednisoLONE acetate (OMNIPRED) 1 % ophthalmic suspension Place 2 drops into the left eye 4 (four) times daily. (Patient not taking: Reported on 06/10/2017) 5 mL 1   No current facility-administered medications for this visit.     REVIEW OF SYSTEMS:   Constitutional: Denies fevers, chills or abnormal night sweats Eyes: Denies blurriness of vision, double vision or watery eyes Ears, nose, mouth, throat, and face: Denies mucositis or sore throat Respiratory: Denies cough, dyspnea or wheezes Cardiovascular: Denies palpitation, chest discomfort or lower extremity swelling Gastrointestinal:  Denies nausea, heartburn or change in bowel habits.  Skin: Denies abnormal skin rashes Lymphatics: Denies new lymphadenopathy or easy bruising Neurological:Denies numbness, tingling or new weaknesses Behavioral/Psych: Mood is stable, no new changes  Musculoskeletal: (+) left of center upper back pain  All other systems were reviewed with the patient and are negative.  PHYSICAL EXAMINATION: ECOG PERFORMANCE STATUS: 1 - Symptomatic but completely ambulatory  Vitals:   06/10/17 1115  BP: 119/74  Pulse: 63  Resp: (!) 24  Temp: 98.5 F (36.9 C)  SpO2: 100%   Filed Weights   06/10/17  1115  Weight: 164 lb 11.2 oz (74.7 kg)    GENERAL:alert, no distress and comfortable SKIN: skin color, texture, turgor are normal, no rashes or significant lesions EYES: normal, conjunctiva are pink and non-injected, sclera clear OROPHARYNX:no exudate, no erythema and lips, buccal mucosa, and tongue normal  NECK: supple, thyroid normal size, non-tender, without nodularity LYMPH:  no palpable lymphadenopathy in the cervical, axillary or inguinal LUNGS: clear to auscultation and percussion with normal breathing effort HEART: regular rate & rhythm and no murmurs and no lower extremity edema ABDOMEN:abdomen soft, non-tender and normal bowel sounds. No palpable hepatosplenomegaly.  Musculoskeletal: no cyanosis of digits and no clubbing. Muscle stiffness to the left  paraspinal area. No palpable masses. No BLE edema.   PSYCH: alert & oriented x 3 with fluent speech NEURO: no focal motor/sensory deficits  LABORATORY DATA:  I have reviewed the data as listed  . CBC Latest Ref Rng & Units 06/10/2017 09/06/2013 09/05/2013  WBC 3.9 - 10.3 10e3/uL 4.6 11.5(H) 7.1  Hemoglobin 11.6 - 15.9 g/dL 12.1 11.0(L) 10.8(L)  Hematocrit 34.8 - 46.6 % 36.7 32.9(L) 33.3(L)  Platelets 145 - 400 10e3/uL 227 267 263   . CMP Latest Ref Rng & Units 06/10/2017 06/10/2017 03/06/2015  Glucose 70 - 140 mg/dl 93 - 108(H)  BUN 7.0 - 26.0 mg/dL 18.5 - 16  Creatinine 0.6 - 1.1 mg/dL 0.8 - 0.71  Sodium 136 - 145 mEq/L 138 - 139  Potassium 3.5 - 5.1 mEq/L 4.0 - 3.8  Chloride 96 - 112 mEq/L - - 108  CO2 22 - 29 mEq/L 23 - 23  Calcium 8.4 - 10.4 mg/dL 9.8 - 9.3  Total Protein 6.0 - 8.5 g/dL 7.6 7.1 7.3  Total Bilirubin 0.20 - 1.20 mg/dL 1.29(H) - 0.7  Alkaline Phos 40 - 150 U/L 88 - 98  AST 5 - 34 U/L 17 - 11  ALT 0 - 55 U/L 21 - 13   Component     Latest Ref Rng & Units 06/10/2017  Iron     41 - 142 ug/dL 106  TIBC     236 - 444 ug/dL 286  UIBC     120 - 384 ug/dL 180  %SAT     21 - 57 % 37  Sed Rate     0 - 40  mm/hr 5  LDH     125 - 245 U/L 201  Ferritin     9 - 269 ng/ml 166       RADIOGRAPHIC STUDIES: I have personally reviewed the radiological images as listed and agreed with the findings in the report. No results found.   ASSESSMENT & PLAN:   #1. Elevated M-spike per outside records. Plan -Patient has no significant anemia at this time, no hypercalcemia and no renal insufficiency. - Has some paraspinal discomfort in the posterior chest which is likely musculoskeletal. She notes that she has CT chest scheduled in ~2 weeks, this will evaluate for any other possible pathology as being the possible cause of her back pain.  -Her myeloma panel shows no M spike, normal quantitative immunoglobulin levels and a normal immunofixation pattern. -Serum kappa lambda free light chains and the ratio is within normal limits. -24-hour UPEP was ordered and is currently pending though doubt that with the above labs this would be of normal. -No overt evidence of multiple myeloma or a significant plasma cell dyscrasia at this time.  #2 mild anemia-was likely related to previous heavy periods has not resolved. Iron levels within normal limits LDH within normal limits and suggests no evidence of hemolysis. -No further evaluation or treatment of anemia indicated at this time.  #3. Recurrent iritis  -Workup is overall benign thus far with Dr Maudie Mercury.  -She has also had a rheumatologic workup which apparently has been unrevealing. -Continue follow-up and management as per primary care physician.  #4. Hypertension -Continue anti-hypertensive medications, will continue to monitor with Dr Mellody Dance today including 24h UPEP.  RTC with Dr Irene Limbo in 2 weeks.   All questions were answered. The patient knows to call the clinic with any problems, questions or concerns. I spent 40 minutes counseling the patient face to face. The  total time spent in the appointment was 60 minutes and more than 50% was on  counseling.  Sullivan Lone MD [place] [facility] Hematology/Oncology Physician [place]  (Office):        [phone] (Work cell): [phone] (Fax):            [phone]   This document serves as a record of services personally performed by Sullivan Lone, MD. It was created on his behalf by Reola Mosher, a trained medical scribe. The creation of this record is based on the scribe's personal observations and the provider's statements to them. This document has been checked and approved by the attending provider.

## 2017-06-11 ENCOUNTER — Ambulatory Visit (INDEPENDENT_AMBULATORY_CARE_PROVIDER_SITE_OTHER): Payer: Medicare Other | Admitting: Internal Medicine

## 2017-06-11 DIAGNOSIS — R0602 Shortness of breath: Secondary | ICD-10-CM

## 2017-06-11 DIAGNOSIS — R053 Chronic cough: Secondary | ICD-10-CM

## 2017-06-11 DIAGNOSIS — R05 Cough: Secondary | ICD-10-CM

## 2017-06-11 LAB — PULMONARY FUNCTION TEST
DL/VA % pred: 112 %
DL/VA: 5.09 ml/min/mmHg/L
DLCO cor % pred: 93 %
DLCO cor: 20.05 ml/min/mmHg
DLCO unc % pred: 93 %
DLCO unc: 20.05 ml/min/mmHg
FEF 25-75 Post: 2.8 L/sec
FEF 25-75 Pre: 2.59 L/sec
FEF2575-%Change-Post: 8 %
FEF2575-%Pred-Post: 165 %
FEF2575-%Pred-Pre: 153 %
FEV1-%Change-Post: 1 %
FEV1-%Pred-Post: 116 %
FEV1-%Pred-Pre: 115 %
FEV1-Post: 2.04 L
FEV1-Pre: 2.02 L
FEV1FVC-%Change-Post: 1 %
FEV1FVC-%Pred-Pre: 107 %
FEV6-%Change-Post: 0 %
FEV6-%Pred-Post: 110 %
FEV6-%Pred-Pre: 110 %
FEV6-Post: 2.38 L
FEV6-Pre: 2.4 L
FEV6FVC-%Change-Post: 0 %
FEV6FVC-%Pred-Post: 103 %
FEV6FVC-%Pred-Pre: 103 %
FVC-%Change-Post: 0 %
FVC-%Pred-Post: 106 %
FVC-%Pred-Pre: 106 %
FVC-Post: 2.4 L
FVC-Pre: 2.41 L
Post FEV1/FVC ratio: 85 %
Post FEV6/FVC ratio: 99 %
Pre FEV1/FVC ratio: 84 %
Pre FEV6/FVC Ratio: 100 %
RV % pred: 76 %
RV: 1.55 L
TLC % pred: 83 %
TLC: 3.97 L

## 2017-06-11 LAB — KAPPA/LAMBDA LIGHT CHAINS
Ig Kappa Free Light Chain: 18 mg/L (ref 3.3–19.4)
Ig Lambda Free Light Chain: 16.1 mg/L (ref 5.7–26.3)
Kappa/Lambda FluidC Ratio: 1.12 (ref 0.26–1.65)

## 2017-06-11 LAB — SEDIMENTATION RATE: Sedimentation Rate-Westergren: 5 mm/hr (ref 0–40)

## 2017-06-11 NOTE — Progress Notes (Signed)
PFT done today. 

## 2017-06-12 LAB — MULTIPLE MYELOMA PANEL, SERUM
Albumin SerPl Elph-Mcnc: 3.9 g/dL (ref 2.9–4.4)
Albumin/Glob SerPl: 1.3 (ref 0.7–1.7)
Alpha 1: 0.2 g/dL (ref 0.0–0.4)
Alpha2 Glob SerPl Elph-Mcnc: 0.6 g/dL (ref 0.4–1.0)
B-Globulin SerPl Elph-Mcnc: 0.9 g/dL (ref 0.7–1.3)
Gamma Glob SerPl Elph-Mcnc: 1.5 g/dL (ref 0.4–1.8)
Globulin, Total: 3.2 g/dL (ref 2.2–3.9)
IgA, Qn, Serum: 231 mg/dL (ref 87–352)
IgG, Qn, Serum: 1420 mg/dL (ref 700–1600)
IgM, Qn, Serum: 70 mg/dL (ref 26–217)
Total Protein: 7.1 g/dL (ref 6.0–8.5)

## 2017-06-17 ENCOUNTER — Other Ambulatory Visit: Payer: Self-pay | Admitting: *Deleted

## 2017-06-17 ENCOUNTER — Ambulatory Visit (HOSPITAL_COMMUNITY)
Admission: RE | Admit: 2017-06-17 | Discharge: 2017-06-17 | Disposition: A | Discharge status: Home / Self Care | Payer: Medicare Other | Source: Ambulatory Visit | Attending: Internal Medicine | Admitting: Internal Medicine

## 2017-06-17 ENCOUNTER — Telehealth: Payer: Self-pay | Admitting: Internal Medicine

## 2017-06-17 DIAGNOSIS — R053 Chronic cough: Secondary | ICD-10-CM

## 2017-06-17 DIAGNOSIS — R0602 Shortness of breath: Secondary | ICD-10-CM | POA: Diagnosis not present

## 2017-06-17 DIAGNOSIS — R05 Cough: Secondary | ICD-10-CM

## 2017-06-17 NOTE — Telephone Encounter (Signed)
Spoke with pt who stated that she has had the PFT (done 06/11/17) and stated that she had a CT chest done today. Pt has not had a CT sinuses yet done so I placed an order for that to be done. Told pt that she would need to come in for an OV after we see she has completed the CT sinus. Pt expressed understanding. Nothing further needed at this time.

## 2017-06-17 NOTE — Telephone Encounter (Signed)
Saw Sheila Fernandez for cough. She only had CT chest - this is normal   PLAN 1. Let her know ct chest normal 2. Ensure she has ct sinus and pft as advised 2.give fu to see me or APP but after completing pft and ct sinus   Dr. Brand Males, M.D., [facility].C.P Pulmonary and Critical Care Medicine Staff Physician [place] [facility] Pager: [phone], If no answer or between  15:00h - 7:00h: call [phone]  06/17/2017 1:13 PM       Ct Chest Wo Contrast  Result Date: 06/17/2017 CLINICAL DATA:  Intermittent shortness of breath, chronic cough for 1 year, intermittent left scapular pain. EXAM: CT CHEST WITHOUT CONTRAST TECHNIQUE: Multidetector CT imaging of the chest was performed following the standard protocol without IV contrast. COMPARISON:  None. FINDINGS: Cardiovascular: Vascular structures are unremarkable. Heart size normal. No pericardial effusion. Mediastinum/Nodes: No pathologically enlarged mediastinal or axillary lymph nodes. Hilar regions are difficult to evaluate without IV contrast. Esophagus is grossly unremarkable. Lungs/Pleura: A few scattered subpleural pulmonary nodules are likely subpleural lymph nodes. Lungs are otherwise clear. No pleural fluid. Airway is unremarkable. Upper Abdomen: Visualized portions of the liver, adrenal glands, kidneys, spleen, pancreas, stomach and bowel are grossly unremarkable. Tiny hiatal hernia. Musculoskeletal: Degenerative changes in the spine. No worrisome lytic or sclerotic lesions. IMPRESSION: No findings to explain the patient's symptoms. Electronically Signed   By: Lorin Picket M.D.   On: 06/17/2017 11:01

## 2017-06-18 DIAGNOSIS — D472 Monoclonal gammopathy: Secondary | ICD-10-CM | POA: Diagnosis not present

## 2017-06-22 LAB — UPEP/UIFE/LIGHT CHAINS/TP, 24-HR UR
% BETA, Urine: 20.2 %
ALBUMIN, U: 54.1 %
ALPHA 1 URINE: 4.7 %
ALPHA-2-GLOBULIN, U: 9.2 %
Free Kappa Lt Chains,Ur: 20.5 mg/L (ref 1.35–24.19)
Free Lambda Lt Chains,Ur: 0.54 mg/L (ref 0.24–6.66)
GAMMA GLOBULIN URINE: 11.9 %
Kappa/Lambda Ratio,U: 37.96 — ABNORMAL HIGH (ref 2.04–10.37)
PROTEIN,TOTAL,URINE: 15.7 mg/dL
Prot,24hr calculated: 267 mg/24 hr — ABNORMAL HIGH (ref 30–150)

## 2017-06-23 ENCOUNTER — Encounter (HOSPITAL_COMMUNITY): Payer: Self-pay

## 2017-06-23 ENCOUNTER — Ambulatory Visit (HOSPITAL_COMMUNITY)
Admission: RE | Admit: 2017-06-23 | Discharge: 2017-06-23 | Disposition: A | Discharge status: Home / Self Care | Payer: Medicare Other | Source: Ambulatory Visit | Attending: Internal Medicine | Admitting: Internal Medicine

## 2017-06-23 DIAGNOSIS — R05 Cough: Secondary | ICD-10-CM | POA: Insufficient documentation

## 2017-06-23 DIAGNOSIS — R0602 Shortness of breath: Secondary | ICD-10-CM | POA: Insufficient documentation

## 2017-06-23 DIAGNOSIS — R053 Chronic cough: Secondary | ICD-10-CM

## 2017-06-23 NOTE — Progress Notes (Signed)
CONSULT NOTE  Patient Care Team: Jani Gravel, MD as PCP - General (Internal Medicine)  CHIEF COMPLAINTS/PURPOSE OF CONSULTATION:  Anemia and MGUS  HISTORY OF PRESENTING ILLNESS:   Sheila Fernandez 66 y.o. 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  She is unsure if this was taken through urine or blood, we will request for her lab work to be sent to our clinic as they were not sent yet. Her recurrent iritis workup has overall remained unrevealing with obvious etiology as per patient.  Also, she was seen by a Rheumatologist recently for ongoing chronic knee pain. Following workup this was thought to be due to normal wear and tear and possible OA.  Additionally, she was seen by Dr Einar Gip for muscular cramps to her lower legs. These have been improving and her workup has not been concerning thus far.   She was also seen by Dr Chase Caller yesterday for ongoing chronic cough. She has follow-up with him tomorrow for ongoing lung function testing and next week again for CT chest.   Overall she is doing well. She feels normal and close to her baseline. She does note some mild, intermittent headaches, but no other concerning symptoms with this. She also reports some just left of center upper back pain which has been ongoing and new for ~2 weeks. She does try to remain active in her daily life, going on walks everyday and remaining involved with her church. She has lost some weight, ~7-8lbs over two months, but she reports that she has been exercising more frequently and has been trying to eat healthier.   She denies recent chest pain on exertion, shortness of breath on minimal exertion, pre-syncopal episodes, or palpitations. She had not noticed any recent bleeding such as epistaxis, hematuria or hematochezia The patient denies over the counter NSAID  ingestion. She is not on antiplatelets agents. Her last colonoscopy was in 2011. She had no prior history or diagnosis of cancer. She denies any pica and eats a variety of diet. She never donated blood or received blood transfusion She does reports a h/o heavy periods with prior known anemia. She has previously been on oral iron supplements, but has never received IV iron.   INTERVAL HISTORY:   Of note since the patient last visit, she has had a CT Chest completed on 06/17/2017 which revealed: IMPRESSION: No findings to explain the patient's symptoms. Pt also underwent a CT Maxillofacial completed on 06/23/2017 with results revealing: IMPRESSION: No acute bony abnormality. No evidence of sinusitis.  Pt presents to the office today. She reports that she is doing well overall. Pt notes that she hasn't had any issues with iritis at this time, but has had recurring episodes since 2006. Pt reports that she used to have episodes of iritis 2-3 times a year lasting at 4 days each episode, but was alleviated with Rx prednisone eye drops. She denies the episodes of iritis being seasonal and denies any other symptoms occurring at the same time as the iritis episodes. She notes that when she has the episodes of iritis, she experiences eye pain. Pt states that she has been evaluated by 2 rheumatologist for her iritis with extensive work ups each time.  Pt has also been evaluated by her opthalmologist in June 2018.  She states that she has been ambulating  for exercise more recently.   On review of systems, pt denies eye pain, watery eyes, sneezing, sore throat. Pt reports productive cough. She denies decreased energy levels.    MEDICAL HISTORY:  Past Medical History:  Diagnosis Date  . Adenomyosis   . Arthritis   . Hypertension   . Iritis, recurrent   . Uterine fibroid     SURGICAL HISTORY: Past Surgical History:  Procedure Laterality Date  . BREAST BIOPSY  1986   benign excisional  . BREAST  LUMPECTOMY     Pt does not recall having lumpectomy  . CESAREAN SECTION    . CHOLECYSTECTOMY    . FOOT SURGERY      SOCIAL HISTORY: Social History   Social History  . Marital status: Divorced    Spouse name: N/A  . Number of children: N/A  . Years of education: N/A   Occupational History  . Not on file.   Social History Main Topics  . Smoking status: Former Smoker    Packs/day: 1.00    Years: 15.00    Types: Cigarettes    Quit date: 11/28/2003  . Smokeless tobacco: Never Used  . Alcohol use No  . Drug use: No  . Sexual activity: Not Currently    Birth control/ protection: None   Other Topics Concern  . Not on file   Social History Narrative  . No narrative on file    FAMILY HISTORY: Family History  Problem Relation Age of Onset  . Cancer Mother 6       Pancreatic  . Hypertension Mother   . Stroke Father   . Cancer Father 57       Prostate  . Hypertension Father   . Diabetes Sister   . Diabetes Brother   . Hypertension Brother   . Kidney disease Brother   . Lupus Daughter   . Diabetes Maternal Grandmother   . HIV Sister   . Cancer Sister        Cancer r/t HIV  . Cancer Brother        Prostate    ALLERGIES:  is allergic to codeine.  MEDICATIONS:  Current Outpatient Prescriptions  Medication Sig Dispense Refill  . amLODipine (NORVASC) 10 MG tablet TAKE ONE TABLET BY MOUTH ONCE DAILY IN THE MORNING 90 tablet 0  . aspirin 81 MG chewable tablet Chew 1 tablet (81 mg total) by mouth once. (Patient not taking: Reported on 06/10/2017)    . carvedilol (COREG) 25 MG tablet Take 25 mg by mouth 2 (two) times daily with a meal.    . Cholecalciferol (VITAMIN D PO) Take 1 tablet by mouth daily.    [name] MAGNESIUM PO Take 1 tablet by mouth daily.    . nitroGLYCERIN (NITROSTAT) 0.4 MG SL tablet Place 1 tablet (0.4 mg total) under the tongue every 5 (five) minutes as needed. Call doctor if single tab used (Patient not taking: Reported on 06/10/2017) 20 tablet 1  . OVER  THE COUNTER MEDICATION Take 1 capsule by mouth daily. Carida 7 - [employer]    . prednisoLONE acetate (OMNIPRED) 1 % ophthalmic suspension Place 2 drops into the left eye 4 (four) times daily. (Patient not taking: Reported on 06/10/2017) 5 mL 1   No current facility-administered medications for this visit.     REVIEW OF SYSTEMS:  Constitutional: Denies fevers, chills or abnormal night sweats Eyes: Denies blurriness of vision, double vision or watery eyes Ears, nose, mouth, throat, and face: Denies mucositis or  sore throat Respiratory: Denies cough, dyspnea or wheezes Cardiovascular: Denies palpitation, chest discomfort or lower extremity swelling Gastrointestinal:  Denies nausea, heartburn or change in bowel habits.  Skin: Denies abnormal skin rashes Lymphatics: Denies new lymphadenopathy or easy bruising Neurological:Denies numbness, tingling or new weaknesses Behavioral/Psych: Mood is stable, no new changes  Musculoskeletal: no arthralgias or myalgias.  All other systems were reviewed with the patient and are negative.  PHYSICAL EXAMINATION:  ECOG PERFORMANCE STATUS: 1 - Symptomatic but completely ambulatory  Vitals:   06/24/17 0858  BP: (!) 139/91  Pulse: 66  Resp: 18  Temp: 99.4 F (37.4 C)  SpO2: 100%   Filed Weights   06/24/17 0858  Weight: 164 lb 6.4 oz (74.6 kg)    GENERAL:alert, no distress and comfortable SKIN: skin color, texture, turgor are normal, no rashes or significant lesions EYES: normal, conjunctiva are pink and non-injected, sclera clear OROPHARYNX: no exudate, no erythema and lips, buccal mucosa, and tongue normal  NECK: supple, thyroid normal size, non-tender, without nodularity LYMPH:  no palpable lymphadenopathy in the cervical, axillary or inguinal LUNGS: clear to auscultation and percussion with normal breathing effort HEART: regular rate & rhythm and no murmurs and no lower extremity edema ABDOMEN:abdomen soft, non-tender and normal bowel  sounds. No palpable hepatosplenomegaly.  Musculoskeletal: no cyanosis of digits and no clubbing. No palpable masses. No BLE edema.   PSYCH: alert & oriented x 3 with fluent speech NEURO: no focal motor/sensory deficits  LABORATORY DATA:  I have reviewed the data as listed  . CBC Latest Ref Rng & Units 06/10/2017 09/06/2013 09/05/2013  WBC 3.9 - 10.3 10e3/uL 4.6 11.5(H) 7.1  Hemoglobin 11.6 - 15.9 g/dL 12.1 11.0(L) 10.8(L)  Hematocrit 34.8 - 46.6 % 36.7 32.9(L) 33.3(L)  Platelets 145 - 400 10e3/uL 227 267 263   . CMP Latest Ref Rng & Units 06/10/2017 06/10/2017 03/06/2015  Glucose 70 - 140 mg/dl 93 - 108(H)  BUN 7.0 - 26.0 mg/dL 18.5 - 16  Creatinine 0.6 - 1.1 mg/dL 0.8 - 0.71  Sodium 136 - 145 mEq/L 138 - 139  Potassium 3.5 - 5.1 mEq/L 4.0 - 3.8  Chloride 96 - 112 mEq/L - - 108  CO2 22 - 29 mEq/L 23 - 23  Calcium 8.4 - 10.4 mg/dL 9.8 - 9.3  Total Protein 6.0 - 8.5 g/dL 7.6 7.1 7.3  Total Bilirubin 0.20 - 1.20 mg/dL 1.29(H) - 0.7  Alkaline Phos 40 - 150 U/L 88 - 98  AST 5 - 34 U/L 17 - 11  ALT 0 - 55 U/L 21 - 13    Component     Latest Ref Rng & Units 06/10/2017  Iron     41 - 142 ug/dL 106  TIBC     236 - 444 ug/dL 286  UIBC     120 - 384 ug/dL 180  %SAT     21 - 57 % 37  Sed Rate     0 - 40 mm/hr 5  LDH     125 - 245 U/L 201  Ferritin     9 - 269 ng/ml 166       RADIOGRAPHIC STUDIES: I have personally reviewed the radiological images as listed and agreed with the findings in the report. Ct Chest Wo Contrast  Result Date: 06/17/2017 CLINICAL DATA:  Intermittent shortness of breath, chronic cough for 1 year, intermittent left scapular pain. EXAM: CT CHEST WITHOUT CONTRAST TECHNIQUE: Multidetector CT imaging of the chest was performed following the standard protocol  without IV contrast. COMPARISON:  None. FINDINGS: Cardiovascular: Vascular structures are unremarkable. Heart size normal. No pericardial effusion. Mediastinum/Nodes: No pathologically enlarged mediastinal  or axillary lymph nodes. Hilar regions are difficult to evaluate without IV contrast. Esophagus is grossly unremarkable. Lungs/Pleura: A few scattered subpleural pulmonary nodules are likely subpleural lymph nodes. Lungs are otherwise clear. No pleural fluid. Airway is unremarkable. Upper Abdomen: Visualized portions of the liver, adrenal glands, kidneys, spleen, pancreas, stomach and bowel are grossly unremarkable. Tiny hiatal hernia. Musculoskeletal: Degenerative changes in the spine. No worrisome lytic or sclerotic lesions. IMPRESSION: No findings to explain the patient's symptoms. Electronically Signed   By: Lorin Picket M.D.   On: 06/17/2017 11:01   Ct Maxillofacial Wo Contrast  Result Date: 06/23/2017 CLINICAL DATA:  Cough, shortness of Breath EXAM: CT MAXILLOFACIAL WITHOUT CONTRAST TECHNIQUE: Multidetector CT imaging of the maxillofacial structures was performed. Multiplanar CT image reconstructions were also generated. COMPARISON:  None. FINDINGS: Osseous: No fracture or mandibular dislocation. No destructive process. Orbits: Negative. No traumatic or inflammatory finding. Sinuses: Clear. No significant mucosal thickening. No air-fluid levels. Mastoid air cells are clear. Soft tissues: Negative Limited intracranial: No significant or unexpected finding. IMPRESSION: No acute bony abnormality. No evidence of sinusitis. Electronically Signed   By: Rolm Baptise M.D.   On: 06/23/2017 08:56     ASSESSMENT & PLAN:   #1. Elevated M-spike per outside records. Plan -Patient has no significant anemia at this time, no hypercalcemia and no renal insufficiency. - Has some paraspinal discomfort in the posterior chest which is likely musculoskeletal- no concerning lesions on CT chest. -CT Chest on 06/17/2017 revealed: IMPRESSION: No findings to explain the patient's symptoms.  -Her myeloma panel shows no M spike, normal quantitative immunoglobulin levels and a normal immunofixation pattern. -Serum kappa  lambda free light chains and the ratio is within normal limits. -24-hour UPEP was ordered and is currently pending though doubt that with the above labs this would be of normal. -No overt evidence of multiple myeloma or a significant plasma cell dyscrasia at this time.  #2 Mild anemia-was likely related to previous heavy periods has now resolved. Iron levels within normal limits LDH within normal limits and suggests no evidence of hemolysis. -No further evaluation or treatment of anemia indicated at this time.  #3. Recurrent iritis  -Workup is overall benign thus far with Dr Maudie Mercury.  -She has also had a rheumatologic workup which apparently has been unrevealing. -Continue follow-up and management as per primary care physician.   #4. Hypertension -Continue anti-hypertensive medications, will continue to monitor with Dr Maudie Mercury   RTC with Dr. Irene Limbo PRN   All questions were answered. The patient knows to call the clinic with any problems, questions or concerns.  I spent 15 minutes counseling the patient face to face. The total time spent in the appointment was 20 minutes and more than 50% was on counseling.  Sullivan Lone MD [place] AAHIVMS [facility] Hematology/Oncology Physician [place]  (Office):        [phone] (Work cell): [phone] (Fax):            [phone]   This document serves as a record of services personally performed by Sullivan Lone, MD. It was created on his behalf by Steva Colder, a trained medical scribe. The creation of this record is based on the scribe's personal observations and the provider's statements to them. This document has been checked and approved by the attending provider.

## 2017-06-24 ENCOUNTER — Encounter: Payer: Self-pay | Admitting: Hematology

## 2017-06-24 ENCOUNTER — Ambulatory Visit (HOSPITAL_BASED_OUTPATIENT_CLINIC_OR_DEPARTMENT_OTHER): Payer: Medicare Other | Admitting: Hematology

## 2017-06-24 VITALS — BP 139/91 | HR 66 | Temp 99.4°F | Resp 18 | Ht 62.0 in | Wt 164.4 lb

## 2017-06-24 DIAGNOSIS — I10 Essential (primary) hypertension: Secondary | ICD-10-CM | POA: Diagnosis not present

## 2017-06-24 DIAGNOSIS — D649 Anemia, unspecified: Secondary | ICD-10-CM

## 2017-06-24 DIAGNOSIS — H209 Unspecified iridocyclitis: Secondary | ICD-10-CM | POA: Diagnosis not present

## 2017-06-24 DIAGNOSIS — D472 Monoclonal gammopathy: Secondary | ICD-10-CM

## 2017-06-25 ENCOUNTER — Telehealth: Payer: Self-pay | Admitting: Internal Medicine

## 2017-06-25 NOTE — Telephone Encounter (Signed)
Ct sinus normal. Ensure other things from  OV and then fu  Ct Maxillofacial Wo Contrast  Result Date: 06/23/2017 CLINICAL DATA:  Cough, shortness of Breath EXAM: CT MAXILLOFACIAL WITHOUT CONTRAST TECHNIQUE: Multidetector CT imaging of the maxillofacial structures was performed. Multiplanar CT image reconstructions were also generated. COMPARISON:  None. FINDINGS: Osseous: No fracture or mandibular dislocation. No destructive process. Orbits: Negative. No traumatic or inflammatory finding. Sinuses: Clear. No significant mucosal thickening. No air-fluid levels. Mastoid air cells are clear. Soft tissues: Negative Limited intracranial: No significant or unexpected finding. IMPRESSION: No acute bony abnormality. No evidence of sinusitis. Electronically Signed   By: Rolm Baptise M.D.   On: 06/23/2017 08:56

## 2017-06-26 NOTE — Telephone Encounter (Signed)
Called pt letting her know the results of her CT sinus and that she needed to come in for a f/u appt. Pt verbalized understanding of results and also that she needed to come in for an appt.   Appt made with Rexene Edison, NP Monday, 06/29/17 at 9:30am.  Nothing further needed.

## 2017-06-29 ENCOUNTER — Encounter: Payer: Self-pay | Admitting: Adult Health

## 2017-06-29 ENCOUNTER — Ambulatory Visit (INDEPENDENT_AMBULATORY_CARE_PROVIDER_SITE_OTHER): Payer: Medicare Other | Admitting: Adult Health

## 2017-06-29 DIAGNOSIS — R05 Cough: Secondary | ICD-10-CM | POA: Diagnosis not present

## 2017-06-29 DIAGNOSIS — R053 Chronic cough: Secondary | ICD-10-CM

## 2017-06-29 NOTE — Patient Instructions (Addendum)
Saline nasal spray 2 puffs Twice daily  .  Begin Prilosec 20mg  daily before meal .  Begin Zyrtec 10mg  At bedtime  At bedtime  .  Begin Mucinex DM Twice daily  For cough .  NO MINTS .  Follow up with Dr. Chase Caller or [place] NP in 6-8 weeks and As needed   Please contact office for sooner follow up if symptoms do not improve or worsen or seek emergency care

## 2017-06-29 NOTE — Progress Notes (Signed)
@Patient  ID: Sheila Fernandez, female    DOB: 09-19-1951, 66 y.o.   MRN: [id number]  Chief Complaint  Patient presents with  . Follow-up    Cough     Referring provider: Jani Gravel, MD  HPI: 66 year old female former smoker seen for pulmonary consult 06/09/2017 for chronic cough for several years.    TESTS and significant events Exhaled nitric oxide 14 ppb   06/29/2017 Follow up : Chronic cough  Patient presents for a one-month follow-up. Patient was seen last visit for a pulmonary consult for chronic refractory cough for several years. Patient was set up for a CT chest that shows some scattered subpleural pulmonary nodules consistent with lymph nodes. Lungs were clear. ENT was normal with an FEV1 at 116%, ratio 85, FVC 106%, no significant bronchodilator response, DLCO 93%. CT sinus was normal. She denies any previous use with  amiodarone methotrexate or Macrobid. Was on Lisinopril 1 year ago . Was changed to Coreg.  Denies any known history of cancer or chemoradiation. She complains of intermittent hacking cough that  She feels she has to bring up thick mucus for years .  She has not tried anything for cough .  Worse first thing in am . No cough at night .  Does have heartburn on occasion .    Allergies  Allergen Reactions  . Codeine Nausea And Vomiting    Immunization History  Administered Date(s) Administered  . Influenza Whole 07/19/2009, 08/15/2010  . Td 08/29/2001    Past Medical History:  Diagnosis Date  . Adenomyosis   . Arthritis   . Hypertension   . Iritis, recurrent   . Uterine fibroid     Tobacco History: History  Smoking Status  . Former Smoker  . Packs/day: 1.00  . Years: 15.00  . Types: Cigarettes  . Quit date: 11/28/2003  Smokeless Tobacco  . Never Used   Counseling given: Not Answered   Outpatient Encounter Prescriptions as of 06/29/2017  Medication Sig  . amLODipine (NORVASC) 10 MG tablet TAKE ONE TABLET BY MOUTH ONCE DAILY IN THE  MORNING  . aspirin 81 MG chewable tablet Chew 1 tablet (81 mg total) by mouth once.  . carvedilol (COREG) 25 MG tablet Take 25 mg by mouth 2 (two) times daily with a meal.  . Cholecalciferol (VITAMIN D PO) Take 1 tablet by mouth daily.  [name] MAGNESIUM PO Take 1 tablet by mouth daily.  . nitroGLYCERIN (NITROSTAT) 0.4 MG SL tablet Place 1 tablet (0.4 mg total) under the tongue every 5 (five) minutes as needed. Call doctor if single tab used  . OVER THE COUNTER MEDICATION Take 1 capsule by mouth daily. Carida 7 - [employer]  . prednisoLONE acetate (OMNIPRED) 1 % ophthalmic suspension Place 2 drops into the left eye 4 (four) times daily.   No facility-administered encounter medications on file as of 06/29/2017.      Review of Systems  Constitutional:   No  weight loss, night sweats,  Fevers, chills, fatigue, or  lassitude.  HEENT:   No headaches,  Difficulty swallowing,  Tooth/dental problems, or  Sore throat,                No sneezing, itching, ear ache,  +nasal congestion, post nasal drip,   CV:  No chest pain,  Orthopnea, PND, swelling in lower extremities, anasarca, dizziness, palpitations, syncope.   GI  + heartburn, indigestion,  No abdominal pain, nausea, vomiting, diarrhea, change in bowel habits, loss of appetite,  bloody stools.   Resp: No shortness of breath with exertion or at rest.  No excess mucus, no productive cough,  No non-productive cough,  No coughing up of blood.  No change in color of mucus.  No wheezing.  No chest wall deformity  Skin: no rash or lesions.  GU: no dysuria, change in color of urine, no urgency or frequency.  No flank pain, no hematuria   MS:  No joint pain or swelling.  No decreased range of motion.  No back pain.    Physical Exam  BP 128/76 (BP Location: Left Arm, Cuff Size: Normal)   Pulse 63   Ht 5\' 2"  (1.575 m)   Wt 162 lb 9.6 oz (73.8 kg)   SpO2 98%   BMI 29.74 kg/m   GEN: A/Ox3; pleasant , NAD, well nourished    HEENT:  [place]/AT,   EACs-clear, TMs-wnl, NOSE-clear, THROAT-clear, no lesions, no postnasal drip or exudate noted.   NECK:  Supple w/ fair ROM; no JVD; normal carotid impulses w/o bruits; no thyromegaly or nodules palpated; no lymphadenopathy.    RESP  Clear  P & A; w/o, wheezes/ rales/ or rhonchi. no accessory muscle use, no dullness to percussion  CARD:  RRR, no m/r/g, no peripheral edema, pulses intact, no cyanosis or clubbing.  GI:   Soft & nt; nml bowel sounds; no organomegaly or masses detected.   Musco: Warm bil, no deformities or joint swelling noted.   Neuro: alert, no focal deficits noted.    Skin: Warm, no lesions or rashes    Lab Results:  CBC  BMET  BNP No results found for: BNP  ProBNP No results found for: PROBNP  Imaging: Ct Chest Wo Contrast  Result Date: 06/17/2017 CLINICAL DATA:  Intermittent shortness of breath, chronic cough for 1 year, intermittent left scapular pain. EXAM: CT CHEST WITHOUT CONTRAST TECHNIQUE: Multidetector CT imaging of the chest was performed following the standard protocol without IV contrast. COMPARISON:  None. FINDINGS: Cardiovascular: Vascular structures are unremarkable. Heart size normal. No pericardial effusion. Mediastinum/Nodes: No pathologically enlarged mediastinal or axillary lymph nodes. Hilar regions are difficult to evaluate without IV contrast. Esophagus is grossly unremarkable. Lungs/Pleura: A few scattered subpleural pulmonary nodules are likely subpleural lymph nodes. Lungs are otherwise clear. No pleural fluid. Airway is unremarkable. Upper Abdomen: Visualized portions of the liver, adrenal glands, kidneys, spleen, pancreas, stomach and bowel are grossly unremarkable. Tiny hiatal hernia. Musculoskeletal: Degenerative changes in the spine. No worrisome lytic or sclerotic lesions. IMPRESSION: No findings to explain the patient's symptoms. Electronically Signed   By: Lorin Picket M.D.   On: 06/17/2017 11:01   Ct Maxillofacial Wo  Contrast  Result Date: 06/23/2017 CLINICAL DATA:  Cough, shortness of Breath EXAM: CT MAXILLOFACIAL WITHOUT CONTRAST TECHNIQUE: Multidetector CT imaging of the maxillofacial structures was performed. Multiplanar CT image reconstructions were also generated. COMPARISON:  None. FINDINGS: Osseous: No fracture or mandibular dislocation. No destructive process. Orbits: Negative. No traumatic or inflammatory finding. Sinuses: Clear. No significant mucosal thickening. No air-fluid levels. Mastoid air cells are clear. Soft tissues: Negative Limited intracranial: No significant or unexpected finding. IMPRESSION: No acute bony abnormality. No evidence of sinusitis. Electronically Signed   By: Rolm Baptise M.D.   On: 06/23/2017 08:56     Assessment & Plan:   Chronic cough Chronic cough for several years  Previous ACE inhibitor use >1 yr ago  On high dose BB .  CT chest , Sinus and PFT are all normal .  Suspect  UACS with triggers of GERD and AR .   Plan  Patient Instructions  Saline nasal spray 2 puffs Twice daily  .  Begin Prilosec 20mg  daily before meal .  Begin Zyrtec 10mg  At bedtime  At bedtime  .  Begin Mucinex DM Twice daily  For cough .  NO MINTS .  Follow up with Dr. Chase Caller or Analeese Andreatta NP in 6-8 weeks and As needed   Please contact office for sooner follow up if symptoms do not improve or worsen or seek emergency care         Rexene Edison, NP 06/29/2017

## 2017-06-29 NOTE — Assessment & Plan Note (Signed)
Chronic cough for several years  Previous ACE inhibitor use >1 yr ago  On high dose BB .  CT chest , Sinus and PFT are all normal .  Suspect UACS with triggers of GERD and AR .   Plan  Patient Instructions  Saline nasal spray 2 puffs Twice daily  .  Begin Prilosec 20mg  daily before meal .  Begin Zyrtec 10mg  At bedtime  At bedtime  .  Begin Mucinex DM Twice daily  For cough .  NO MINTS .  Follow up with Dr. Chase Caller or Jackson Fetters NP in 6-8 weeks and As needed   Please contact office for sooner follow up if symptoms do not improve or worsen or seek emergency care

## 2017-07-01 ENCOUNTER — Encounter: Payer: Self-pay | Admitting: Adult Health

## 2017-07-01 MED ORDER — CETIRIZINE HCL 10 MG PO TABS: 10.0000 mg | ORAL_TABLET | Freq: Every day | ORAL | 5 refills | Status: DC

## 2017-07-01 MED ORDER — OMEPRAZOLE 20 MG PO CPDR: 20.0000 mg | DELAYED_RELEASE_CAPSULE | Freq: Every day | ORAL | 5 refills | Status: DC

## 2017-07-02 ENCOUNTER — Encounter: Payer: Self-pay | Admitting: Hematology

## 2017-08-12 ENCOUNTER — Ambulatory Visit: Payer: Self-pay | Admitting: Internal Medicine

## 2017-10-05 ENCOUNTER — Other Ambulatory Visit: Payer: Self-pay | Admitting: Internal Medicine

## 2017-10-05 DIAGNOSIS — Z1231 Encounter for screening mammogram for malignant neoplasm of breast: Secondary | ICD-10-CM

## 2017-10-26 DIAGNOSIS — M25569 Pain in unspecified knee: Secondary | ICD-10-CM | POA: Diagnosis not present

## 2017-10-26 DIAGNOSIS — M2550 Pain in unspecified joint: Secondary | ICD-10-CM | POA: Diagnosis not present

## 2017-10-26 DIAGNOSIS — H209 Unspecified iridocyclitis: Secondary | ICD-10-CM | POA: Diagnosis not present

## 2017-10-26 DIAGNOSIS — M1990 Unspecified osteoarthritis, unspecified site: Secondary | ICD-10-CM | POA: Diagnosis not present

## 2017-10-29 DIAGNOSIS — H20029 Recurrent acute iridocyclitis, unspecified eye: Secondary | ICD-10-CM | POA: Diagnosis not present

## 2017-10-29 DIAGNOSIS — H25013 Cortical age-related cataract, bilateral: Secondary | ICD-10-CM | POA: Diagnosis not present

## 2017-10-29 DIAGNOSIS — H2513 Age-related nuclear cataract, bilateral: Secondary | ICD-10-CM | POA: Diagnosis not present

## 2017-10-29 DIAGNOSIS — H40013 Open angle with borderline findings, low risk, bilateral: Secondary | ICD-10-CM | POA: Diagnosis not present

## 2017-11-09 ENCOUNTER — Ambulatory Visit
Admission: RE | Admit: 2017-11-09 | Discharge: 2017-11-09 | Disposition: A | Discharge status: Home / Self Care | Payer: Medicare HMO | Source: Ambulatory Visit | Attending: Internal Medicine | Admitting: Internal Medicine

## 2017-11-09 DIAGNOSIS — Z1231 Encounter for screening mammogram for malignant neoplasm of breast: Secondary | ICD-10-CM

## 2017-11-11 ENCOUNTER — Other Ambulatory Visit: Payer: Self-pay | Admitting: Internal Medicine

## 2017-11-11 DIAGNOSIS — R928 Other abnormal and inconclusive findings on diagnostic imaging of breast: Secondary | ICD-10-CM

## 2017-11-17 ENCOUNTER — Other Ambulatory Visit: Payer: Self-pay

## 2017-11-17 DIAGNOSIS — D649 Anemia, unspecified: Secondary | ICD-10-CM | POA: Diagnosis not present

## 2017-11-17 DIAGNOSIS — A0811 Acute gastroenteropathy due to Norwalk agent: Secondary | ICD-10-CM | POA: Diagnosis not present

## 2017-11-17 DIAGNOSIS — I10 Essential (primary) hypertension: Secondary | ICD-10-CM | POA: Diagnosis not present

## 2017-11-20 DIAGNOSIS — H6120 Impacted cerumen, unspecified ear: Secondary | ICD-10-CM | POA: Diagnosis not present

## 2017-11-26 ENCOUNTER — Ambulatory Visit
Admission: RE | Admit: 2017-11-26 | Discharge: 2017-11-26 | Disposition: A | Discharge status: Home / Self Care | Payer: Medicare HMO | Source: Ambulatory Visit | Attending: Internal Medicine | Admitting: Internal Medicine

## 2017-11-26 ENCOUNTER — Ambulatory Visit: Payer: Self-pay

## 2017-11-26 DIAGNOSIS — R928 Other abnormal and inconclusive findings on diagnostic imaging of breast: Secondary | ICD-10-CM | POA: Diagnosis not present

## 2018-01-05 DIAGNOSIS — R51 Headache: Secondary | ICD-10-CM | POA: Diagnosis not present

## 2018-01-05 DIAGNOSIS — R42 Dizziness and giddiness: Secondary | ICD-10-CM | POA: Diagnosis not present

## 2018-01-06 ENCOUNTER — Other Ambulatory Visit: Payer: Self-pay | Admitting: Internal Medicine

## 2018-01-06 DIAGNOSIS — R42 Dizziness and giddiness: Secondary | ICD-10-CM

## 2018-01-14 DIAGNOSIS — I951 Orthostatic hypotension: Secondary | ICD-10-CM | POA: Diagnosis not present

## 2018-01-14 DIAGNOSIS — Z87891 Personal history of nicotine dependence: Secondary | ICD-10-CM | POA: Diagnosis not present

## 2018-01-14 DIAGNOSIS — R0609 Other forms of dyspnea: Secondary | ICD-10-CM | POA: Diagnosis not present

## 2018-01-14 DIAGNOSIS — I10 Essential (primary) hypertension: Secondary | ICD-10-CM | POA: Diagnosis not present

## 2018-01-19 ENCOUNTER — Ambulatory Visit
Admission: RE | Admit: 2018-01-19 | Discharge: 2018-01-19 | Disposition: A | Discharge status: Home / Self Care | Payer: Medicare HMO | Source: Ambulatory Visit | Attending: Internal Medicine | Admitting: Internal Medicine

## 2018-01-19 DIAGNOSIS — R42 Dizziness and giddiness: Secondary | ICD-10-CM | POA: Diagnosis not present

## 2018-01-26 DIAGNOSIS — I10 Essential (primary) hypertension: Secondary | ICD-10-CM | POA: Diagnosis not present

## 2018-01-28 DIAGNOSIS — D649 Anemia, unspecified: Secondary | ICD-10-CM | POA: Diagnosis not present

## 2018-01-28 DIAGNOSIS — Z780 Asymptomatic menopausal state: Secondary | ICD-10-CM | POA: Diagnosis not present

## 2018-01-28 DIAGNOSIS — R42 Dizziness and giddiness: Secondary | ICD-10-CM | POA: Diagnosis not present

## 2018-01-28 DIAGNOSIS — R739 Hyperglycemia, unspecified: Secondary | ICD-10-CM | POA: Diagnosis not present

## 2018-01-28 DIAGNOSIS — I10 Essential (primary) hypertension: Secondary | ICD-10-CM | POA: Diagnosis not present

## 2018-02-11 DIAGNOSIS — R69 Illness, unspecified: Secondary | ICD-10-CM | POA: Diagnosis not present

## 2018-02-11 DIAGNOSIS — Z8249 Family history of ischemic heart disease and other diseases of the circulatory system: Secondary | ICD-10-CM | POA: Diagnosis not present

## 2018-02-11 DIAGNOSIS — I10 Essential (primary) hypertension: Secondary | ICD-10-CM | POA: Diagnosis not present

## 2018-02-11 DIAGNOSIS — Z809 Family history of malignant neoplasm, unspecified: Secondary | ICD-10-CM | POA: Diagnosis not present

## 2018-02-11 DIAGNOSIS — Z833 Family history of diabetes mellitus: Secondary | ICD-10-CM | POA: Diagnosis not present

## 2018-02-11 DIAGNOSIS — Z823 Family history of stroke: Secondary | ICD-10-CM | POA: Diagnosis not present

## 2018-02-11 DIAGNOSIS — Z87891 Personal history of nicotine dependence: Secondary | ICD-10-CM | POA: Diagnosis not present

## 2018-02-11 DIAGNOSIS — G8929 Other chronic pain: Secondary | ICD-10-CM | POA: Diagnosis not present

## 2018-02-16 DIAGNOSIS — I10 Essential (primary) hypertension: Secondary | ICD-10-CM | POA: Diagnosis not present

## 2018-02-16 DIAGNOSIS — I951 Orthostatic hypotension: Secondary | ICD-10-CM | POA: Diagnosis not present

## 2018-03-02 DIAGNOSIS — I10 Essential (primary) hypertension: Secondary | ICD-10-CM | POA: Diagnosis not present

## 2018-03-02 DIAGNOSIS — Z87891 Personal history of nicotine dependence: Secondary | ICD-10-CM | POA: Diagnosis not present

## 2018-03-02 DIAGNOSIS — Z8679 Personal history of other diseases of the circulatory system: Secondary | ICD-10-CM | POA: Diagnosis not present

## 2018-03-02 DIAGNOSIS — R0609 Other forms of dyspnea: Secondary | ICD-10-CM | POA: Diagnosis not present

## 2018-04-26 DIAGNOSIS — M2550 Pain in unspecified joint: Secondary | ICD-10-CM | POA: Diagnosis not present

## 2018-04-26 DIAGNOSIS — H209 Unspecified iridocyclitis: Secondary | ICD-10-CM | POA: Diagnosis not present

## 2018-04-26 DIAGNOSIS — M25569 Pain in unspecified knee: Secondary | ICD-10-CM | POA: Diagnosis not present

## 2018-04-26 DIAGNOSIS — M1990 Unspecified osteoarthritis, unspecified site: Secondary | ICD-10-CM | POA: Diagnosis not present

## 2018-04-27 DIAGNOSIS — H40023 Open angle with borderline findings, high risk, bilateral: Secondary | ICD-10-CM | POA: Diagnosis not present

## 2018-04-27 DIAGNOSIS — H20029 Recurrent acute iridocyclitis, unspecified eye: Secondary | ICD-10-CM | POA: Diagnosis not present

## 2018-04-27 DIAGNOSIS — H25013 Cortical age-related cataract, bilateral: Secondary | ICD-10-CM | POA: Diagnosis not present

## 2018-04-27 DIAGNOSIS — H2513 Age-related nuclear cataract, bilateral: Secondary | ICD-10-CM | POA: Diagnosis not present

## 2018-06-02 DIAGNOSIS — Z87891 Personal history of nicotine dependence: Secondary | ICD-10-CM | POA: Diagnosis not present

## 2018-06-02 DIAGNOSIS — R0609 Other forms of dyspnea: Secondary | ICD-10-CM | POA: Diagnosis not present

## 2018-06-02 DIAGNOSIS — I10 Essential (primary) hypertension: Secondary | ICD-10-CM | POA: Diagnosis not present

## 2018-06-02 DIAGNOSIS — I951 Orthostatic hypotension: Secondary | ICD-10-CM | POA: Diagnosis not present

## 2018-06-09 ENCOUNTER — Ambulatory Visit (INDEPENDENT_AMBULATORY_CARE_PROVIDER_SITE_OTHER)
Admission: RE | Admit: 2018-06-09 | Discharge: 2018-06-09 | Disposition: A | Discharge status: Home / Self Care | Payer: Medicare Other | Source: Ambulatory Visit | Attending: Internal Medicine | Admitting: Internal Medicine

## 2018-06-09 DIAGNOSIS — R0602 Shortness of breath: Secondary | ICD-10-CM | POA: Diagnosis not present

## 2018-06-09 DIAGNOSIS — R053 Chronic cough: Secondary | ICD-10-CM

## 2018-06-09 DIAGNOSIS — R05 Cough: Secondary | ICD-10-CM

## 2018-08-17 DIAGNOSIS — I10 Essential (primary) hypertension: Secondary | ICD-10-CM | POA: Diagnosis not present

## 2018-08-17 DIAGNOSIS — D649 Anemia, unspecified: Secondary | ICD-10-CM | POA: Diagnosis not present

## 2018-08-17 DIAGNOSIS — D509 Iron deficiency anemia, unspecified: Secondary | ICD-10-CM | POA: Diagnosis not present

## 2018-08-17 DIAGNOSIS — N390 Urinary tract infection, site not specified: Secondary | ICD-10-CM | POA: Diagnosis not present

## 2018-08-24 DIAGNOSIS — I10 Essential (primary) hypertension: Secondary | ICD-10-CM | POA: Diagnosis not present

## 2018-08-24 DIAGNOSIS — H209 Unspecified iridocyclitis: Secondary | ICD-10-CM | POA: Diagnosis not present

## 2018-08-24 DIAGNOSIS — Z23 Encounter for immunization: Secondary | ICD-10-CM | POA: Diagnosis not present

## 2018-08-24 DIAGNOSIS — R739 Hyperglycemia, unspecified: Secondary | ICD-10-CM | POA: Diagnosis not present

## 2018-08-24 DIAGNOSIS — Z780 Asymptomatic menopausal state: Secondary | ICD-10-CM | POA: Diagnosis not present

## 2018-08-31 DIAGNOSIS — Z780 Asymptomatic menopausal state: Secondary | ICD-10-CM | POA: Diagnosis not present

## 2018-11-02 DIAGNOSIS — H40023 Open angle with borderline findings, high risk, bilateral: Secondary | ICD-10-CM | POA: Diagnosis not present

## 2018-12-01 ENCOUNTER — Other Ambulatory Visit: Payer: Self-pay | Admitting: Cardiology

## 2018-12-05 NOTE — Progress Notes (Deleted)
Subjective:  Primary Physician:  Jani Gravel, MD  Patient ID: Sheila Fernandez, female    DOB: [birth year], 68 y.o.   MRN: [id number]  No chief complaint on file.   HPI: Sheila Fernandez  is a 68 y.o. female  with hypertension, former tobacco use, followed by [place] for orthostatic hypotension.  She underwent echocardiogram in May 2019 revealing mild LVH and grade 1 diastolic dysfunction, otherwise essentially normal echocardiogram. Symptoms of positional dizziness improved with changing from amlodipine to losartan. She has not had any syncopal episodes.   Past Medical History:  Diagnosis Date  . Adenomyosis   . Arthritis   . Hypertension   . Iritis, recurrent   . Uterine fibroid     Past Surgical History:  Procedure Laterality Date  . BREAST BIOPSY Left 1986   benign excisional no scar seen   . BREAST LUMPECTOMY     Pt does not recall having lumpectomy  . CESAREAN SECTION    . CHOLECYSTECTOMY    . FOOT SURGERY      Social History   Socioeconomic History  . Marital status: Divorced    Spouse name: Not on file  . Number of children: Not on file  . Years of education: Not on file  . Highest education level: Not on file  Occupational History  . Not on file  Social Needs  . Financial resource strain: Not on file  . Food insecurity:    Worry: Not on file    Inability: Not on file  . Transportation needs:    Medical: Not on file    Non-medical: Not on file  Tobacco Use  . Smoking status: Former Smoker    Packs/day: 1.00    Years: 15.00    Pack years: 15.00    Types: Cigarettes    Last attempt to quit: 11/28/2003    Years since quitting: 15.0  . Smokeless tobacco: Never Used  Substance and Sexual Activity  . Alcohol use: No  . Drug use: No  . Sexual activity: Not Currently    Birth control/protection: None  Lifestyle  . Physical activity:    Days per week: Not on file    Minutes per session: Not on file  . Stress: Not on file  Relationships  . Social  connections:    Talks on phone: Not on file    Gets together: Not on file    Attends religious service: Not on file    Active member of club or organization: Not on file    Attends meetings of clubs or organizations: Not on file    Relationship status: Not on file  . Intimate partner violence:    Fear of current or ex partner: Not on file    Emotionally abused: Not on file    Physically abused: Not on file    Forced sexual activity: Not on file  Other Topics Concern  . Not on file  Social History Narrative  . Not on file    Current Outpatient Medications on File Prior to Visit  Medication Sig Dispense Refill  . amLODipine (NORVASC) 10 MG tablet TAKE ONE TABLET BY MOUTH ONCE DAILY IN THE MORNING 90 tablet 0  . aspirin 81 MG chewable tablet Chew 1 tablet (81 mg total) by mouth once.    . carvedilol (COREG) 25 MG tablet Take 25 mg by mouth 2 (two) times daily with a meal.    . cetirizine (ZYRTEC ALLERGY) 10 MG tablet Take 1 tablet (  10 mg total) by mouth daily. 30 tablet 5  . Cholecalciferol (VITAMIN D PO) Take 1 tablet by mouth daily.    [name] losartan (COZAAR) 100 MG tablet TAKE 1 (ONE) TABLET DAILY AT BEDTIME. STOP AMLODIPINE. 90 tablet 1  . MAGNESIUM PO Take 1 tablet by mouth daily.    . nitroGLYCERIN (NITROSTAT) 0.4 MG SL tablet Place 1 tablet (0.4 mg total) under the tongue every 5 (five) minutes as needed. Call doctor if single tab used 20 tablet 1  . omeprazole (PRILOSEC) 20 MG capsule Take 1 capsule (20 mg total) by mouth daily. 30 capsule 5  . OVER THE COUNTER MEDICATION Take 1 capsule by mouth daily. [place] [employer] - [employer]    . prednisoLONE acetate (OMNIPRED) 1 % ophthalmic suspension Place 2 drops into the left eye 4 (four) times daily. 5 mL 1   No current facility-administered medications on file prior to visit.     ***Review of Systems  Respiratory: Positive for shortness of breath (chronic and stable).   Neurological: Positive for dizziness.       Objective:    There were no vitals taken for this visit. There is no height or weight on file to calculate BMI.  ***Physical Exam  Constitutional: She appears well-developed and well-nourished. No distress.  HENT:  Head: Atraumatic.  Eyes: Conjunctivae are normal.  Neck: Neck supple. No JVD present. No thyromegaly present.  Cardiovascular: Normal rate, regular rhythm, normal heart sounds and intact distal pulses. Exam reveals no gallop.  No murmur heard. Pulmonary/Chest: Effort normal and breath sounds normal.  Abdominal: Soft. Bowel sounds are normal.  Musculoskeletal: Normal range of motion.        General: No edema.  Neurological: She is alert.  Skin: Skin is warm and dry.  Psychiatric: She has a normal mood and affect.    CARDIAC STUDIES:   Echocardiogram 02/16/2018: Left ventricle cavity is normal in size. Mild concentric hypertrophy of the left ventricle. Normal global wall motion. Doppler evidence of grade I (impaired) diastolic dysfunction, normal LAP. Calculated EF 76%. Mild tricuspid regurgitation. No evidence of pulmonary hypertension.  ABI 05/05/2017 This exam reveals normal perfusion of the lower extremity. Riight ABI 0.99 and left ABI 1.00 with triphasic waveforms  Assessment & Recommendations:   1. HYPERTENSION, BENIGN SYSTEMIC  2. Orthostatic hypotension  3. Dyspnea on exertion   Recommendation: ***  Adrian Prows, MD, [facility] 12/05/2018, 8:44 PM [facility]. [place] Pager: [phone] Office: [phone] If no answer Cell [phone]

## 2018-12-06 ENCOUNTER — Other Ambulatory Visit: Payer: Self-pay | Admitting: Internal Medicine

## 2018-12-06 ENCOUNTER — Ambulatory Visit: Payer: Self-pay | Admitting: Cardiology

## 2018-12-06 DIAGNOSIS — Z1231 Encounter for screening mammogram for malignant neoplasm of breast: Secondary | ICD-10-CM

## 2018-12-07 ENCOUNTER — Ambulatory Visit
Admission: RE | Admit: 2018-12-07 | Discharge: 2018-12-07 | Disposition: A | Discharge status: Home / Self Care | Payer: Medicare Other | Source: Ambulatory Visit | Attending: Internal Medicine | Admitting: Internal Medicine

## 2018-12-07 DIAGNOSIS — Z1231 Encounter for screening mammogram for malignant neoplasm of breast: Secondary | ICD-10-CM | POA: Diagnosis not present

## 2019-01-21 ENCOUNTER — Ambulatory Visit (INDEPENDENT_AMBULATORY_CARE_PROVIDER_SITE_OTHER): Payer: Medicare Other | Admitting: Cardiology

## 2019-01-21 ENCOUNTER — Encounter: Payer: Self-pay | Admitting: Cardiology

## 2019-01-21 ENCOUNTER — Other Ambulatory Visit: Payer: Self-pay

## 2019-01-21 VITALS — BP 135/82 | Ht 62.5 in | Wt 160.0 lb

## 2019-01-21 DIAGNOSIS — D573 Sickle-cell trait: Secondary | ICD-10-CM

## 2019-01-21 DIAGNOSIS — R0609 Other forms of dyspnea: Secondary | ICD-10-CM | POA: Diagnosis not present

## 2019-01-21 DIAGNOSIS — I951 Orthostatic hypotension: Secondary | ICD-10-CM

## 2019-01-21 DIAGNOSIS — I10 Essential (primary) hypertension: Secondary | ICD-10-CM

## 2019-01-21 DIAGNOSIS — R0600 Dyspnea, unspecified: Secondary | ICD-10-CM | POA: Insufficient documentation

## 2019-01-21 HISTORY — DX: Orthostatic hypotension: I95.1

## 2019-01-21 NOTE — Progress Notes (Signed)
Primary Physician/Referring:  Jani Gravel, MD  Patient ID: Sheila Fernandez, female    DOB: [birth year], 68 y.o.   MRN: [id number]  Chief Complaint  Patient presents with  . Dizziness    6 month f/u   . Hypertension    HPI: Sheila Fernandez  is a 68 y.o. female  with  hypertension, former tobacco use, followed by [place] for orthostatic hypotension. She has diagnosis of essential hypertension and also sickle cell trait.  She underwent echocardiogram in May 2019 revealing mild LVH and grade 1 diastolic dysfunction, otherwise essentially normal echocardiogram.   Symptoms of positional dizziness improved with changing from amlodipine to losartan. She has not had any syncopal episodes.   This is a virtual visit, six-month visit.  She is noticed significant improvement in overall well-being and also states that her blood pressure has been very well controlled and she continues to walk for at least 30 minutes a day without any chest pain or shortness of breath.  She has mild occasional dizziness and has mild dyspnea on exertion that is remained stable without PND or orthopnea.  Past Medical History:  Diagnosis Date  . Adenomyosis   . Arthritis   . Hypertension   . Iritis, recurrent   . Orthostatic hypotension 01/21/2019  . Uterine fibroid     Past Surgical History:  Procedure Laterality Date  . BREAST BIOPSY Left 1986   benign excisional no scar seen   . BREAST LUMPECTOMY Left    Pt does not recall having lumpectomy  . CESAREAN SECTION    . CHOLECYSTECTOMY    . FOOT SURGERY      Social History   Socioeconomic History  . Marital status: Divorced    Spouse name: Not on file  . Number of children: Not on file  . Years of education: Not on file  . Highest education level: Not on file  Occupational History  . Not on file  Social Needs  . Financial resource strain: Not on file  . Food insecurity:    Worry: Not on file    Inability: Not on file  . Transportation needs:    Medical: Not on file    Non-medical: Not on file  Tobacco Use  . Smoking status: Former Smoker    Packs/day: 1.00    Years: 15.00    Pack years: 15.00    Types: Cigarettes    Last attempt to quit: 11/28/2003    Years since quitting: 15.1  . Smokeless tobacco: Never Used  Substance and Sexual Activity  . Alcohol use: No  . Drug use: No  . Sexual activity: Not Currently    Birth control/protection: None  Lifestyle  . Physical activity:    Days per week: Not on file    Minutes per session: Not on file  . Stress: Not on file  Relationships  . Social connections:    Talks on phone: Not on file    Gets together: Not on file    Attends religious service: Not on file    Active member of club or organization: Not on file    Attends meetings of clubs or organizations: Not on file    Relationship status: Not on file  . Intimate partner violence:    Fear of current or ex partner: Not on file    Emotionally abused: Not on file    Physically abused: Not on file    Forced sexual activity: Not on file  Other Topics Concern  .  Not on file  Social History Narrative  . Not on file    Current Outpatient Medications on File Prior to Visit  Medication Sig Dispense Refill  . carvedilol (COREG) 25 MG tablet Take 25 mg by mouth 2 (two) times daily with a meal.    . Cholecalciferol (VITAMIN D PO) Take 1 tablet by mouth daily.    [name] co-enzyme Q-10 30 MG capsule Take 30 mg by mouth 3 (three) times daily.    [name] losartan (COZAAR) 100 MG tablet TAKE 1 (ONE) TABLET DAILY AT BEDTIME. STOP AMLODIPINE. 90 tablet 1  . MAGNESIUM PO Take 1 tablet by mouth daily.    . Misc Natural Products (DANDELION ROOT PO) Take by mouth.    . prednisoLONE acetate (OMNIPRED) 1 % ophthalmic suspension Place 2 drops into the left eye 4 (four) times daily. (Patient taking differently: Place 2 drops into the left eye as needed. ) 5 mL 1   No current facility-administered medications on file prior to visit.     Review of  Systems  Constitution: Negative for chills, decreased appetite, malaise/fatigue and weight gain.  Cardiovascular: Positive for dyspnea on exertion. Negative for leg swelling and syncope.  Endocrine: Negative for cold intolerance.  Hematologic/Lymphatic: Does not bruise/bleed easily.  Musculoskeletal: Negative for joint swelling.  Gastrointestinal: Negative for abdominal pain, anorexia and change in bowel habit.  Neurological: Positive for dizziness. Negative for light-headedness.  Psychiatric/Behavioral: Negative for depression and substance abuse.  All other systems reviewed and are negative.     Objective:  Blood pressure 135/82, height 5' 2.5" (1.588 m), weight 160 lb (72.6 kg). Body mass index is 28.8 kg/m. Physical exam not done due to virtual visit.  Prior visit exam is as below.  Physical Exam  Constitutional: She appears well-developed. No distress.  Mildly obese  HENT:  Head: Atraumatic.  Eyes: Conjunctivae are normal.  Neck: Neck supple. No JVD present. No thyromegaly present.  Cardiovascular: Normal rate, regular rhythm, normal heart sounds and intact distal pulses. Exam reveals no gallop.  No murmur heard. Pulmonary/Chest: Effort normal and breath sounds normal.  Abdominal: Soft. Bowel sounds are normal.  Musculoskeletal: Normal range of motion.        General: No edema.  Neurological: She is alert.  Skin: Skin is warm and dry.  Psychiatric: She has a normal mood and affect.   Radiology: No results found. Laboratory Examination:   01/26/2018: Creatinine 0.85, EGFR 82, potassium 4.1, BMP normal. 11/17/2017: CBC normal. Glucose 126, Creatinine 1.0, EGFR 60/72, potassium 3.7, CMP normal.  CMP Latest Ref Rng & Units 06/10/2017 06/10/2017 03/06/2015  Glucose 70 - 140 mg/dl 93 - 108(H)  BUN 7.0 - 26.0 mg/dL 18.5 - 16  Creatinine 0.6 - 1.1 mg/dL 0.8 - 0.71  Sodium 136 - 145 mEq/L 138 - 139  Potassium 3.5 - 5.1 mEq/L 4.0 - 3.8  Chloride 96 - 112 mEq/L - - 108  CO2 22 - 29  mEq/L 23 - 23  Calcium 8.4 - 10.4 mg/dL 9.8 - 9.3  Total Protein 6.0 - 8.5 g/dL 7.6 7.1 7.3  Total Bilirubin 0.20 - 1.20 mg/dL 1.29(H) - 0.7  Alkaline Phos 40 - 150 U/L 88 - 98  AST 5 - 34 U/L 17 - 11  ALT 0 - 55 U/L 21 - 13   CBC Latest Ref Rng & Units 06/10/2017 09/06/2013 09/05/2013  WBC 3.9 - 10.3 10e3/uL 4.6 11.5(H) 7.1  Hemoglobin 11.6 - 15.9 g/dL 12.1 11.0(L) 10.8(L)  Hematocrit 34.8 - 46.6 %  36.7 32.9(L) 33.3(L)  Platelets 145 - 400 10e3/uL 227 267 263   Lipid Panel     Component Value Date/Time   CHOL 149 03/06/2015 0826   TRIG 120 03/06/2015 0826   HDL 40 (L) 03/06/2015 0826   CHOLHDL 3.7 03/06/2015 0826   VLDL 24 03/06/2015 0826   LDLCALC 85 03/06/2015 0826   LDLDIRECT 60 08/15/2010 2040   HEMOGLOBIN A1C Lab Results  Component Value Date   HGBA1C 5.5 04/16/2015   MPG 111 09/06/2013   TSH No results for input(s): TSH in the last 8760 hours.  Cardiac studies:   Echocardiogram 02/16/2018: Left ventricle cavity is normal in size. Mild concentric hypertrophy of the left ventricle. Normal global wall motion. Doppler evidence of grade I (impaired) diastolic dysfunction, normal LAP. Calculated EF 76%. Mild tricuspid regurgitation.  No evidence of pulmonary hypertension.   Assessment:    Orthostatic hypotension  HYPERTENSION, BENIGN SYSTEMIC  Dyspnea on exertion  Sickle-cell trait ([place]), Chronic  EKG 0/18/2019: Normal sinus rhythm at the rate of 73 bpm, left atrial enlargement, otherwise normal EKG.  Recommendations:    Patient is presently doing well with very mild symptoms of dizziness due to orthostatic hypotension.  Blood pressure is well controlled, she feels well and has been walking on a daily basis without significant dyspnea.  I do not know the exact etiology for her orthostatic hypotension however her echocardiogram is essentially normal.  I may consider doing a stress test if dyspnea symptoms persist on her next office visit after Covid 19 issues are  resolved. I would like to see her back in 6 months.  No changes in the medications were done today.  She is scheduled for complete physical exam next month by her PCP.  Adrian Prows, MD, [facility] 01/21/2019, 10:14 AM [facility]. [place] Pager: [phone] Office: [phone] If no answer Cell [phone]

## 2019-02-18 DIAGNOSIS — I10 Essential (primary) hypertension: Secondary | ICD-10-CM | POA: Diagnosis not present

## 2019-02-18 DIAGNOSIS — D649 Anemia, unspecified: Secondary | ICD-10-CM | POA: Diagnosis not present

## 2019-02-24 DIAGNOSIS — I10 Essential (primary) hypertension: Secondary | ICD-10-CM | POA: Diagnosis not present

## 2019-02-24 DIAGNOSIS — R7309 Other abnormal glucose: Secondary | ICD-10-CM | POA: Diagnosis not present

## 2019-02-24 DIAGNOSIS — H6120 Impacted cerumen, unspecified ear: Secondary | ICD-10-CM | POA: Diagnosis not present

## 2019-04-27 DIAGNOSIS — M25569 Pain in unspecified knee: Secondary | ICD-10-CM | POA: Diagnosis not present

## 2019-04-27 DIAGNOSIS — H209 Unspecified iridocyclitis: Secondary | ICD-10-CM | POA: Diagnosis not present

## 2019-04-27 DIAGNOSIS — M1990 Unspecified osteoarthritis, unspecified site: Secondary | ICD-10-CM | POA: Diagnosis not present

## 2019-04-27 DIAGNOSIS — M2550 Pain in unspecified joint: Secondary | ICD-10-CM | POA: Diagnosis not present

## 2019-05-03 DIAGNOSIS — H40023 Open angle with borderline findings, high risk, bilateral: Secondary | ICD-10-CM | POA: Diagnosis not present

## 2019-05-03 DIAGNOSIS — H25013 Cortical age-related cataract, bilateral: Secondary | ICD-10-CM | POA: Diagnosis not present

## 2019-05-03 DIAGNOSIS — H2513 Age-related nuclear cataract, bilateral: Secondary | ICD-10-CM | POA: Diagnosis not present

## 2019-05-03 DIAGNOSIS — H2000 Unspecified acute and subacute iridocyclitis: Secondary | ICD-10-CM | POA: Diagnosis not present

## 2019-05-12 ENCOUNTER — Other Ambulatory Visit: Payer: Self-pay

## 2019-05-12 DIAGNOSIS — Z20822 Contact with and (suspected) exposure to covid-19: Secondary | ICD-10-CM

## 2019-05-14 LAB — NOVEL CORONAVIRUS, NAA: SARS-CoV-2, NAA: NOT DETECTED

## 2019-07-25 ENCOUNTER — Encounter: Payer: Self-pay | Admitting: Cardiology

## 2019-07-26 ENCOUNTER — Encounter: Payer: Self-pay | Admitting: Cardiology

## 2019-07-26 ENCOUNTER — Other Ambulatory Visit: Payer: Self-pay

## 2019-07-26 ENCOUNTER — Ambulatory Visit (INDEPENDENT_AMBULATORY_CARE_PROVIDER_SITE_OTHER): Payer: Medicare Other | Admitting: Cardiology

## 2019-07-26 VITALS — BP 177/97 | HR 59 | Ht 62.0 in | Wt 155.0 lb

## 2019-07-26 DIAGNOSIS — I951 Orthostatic hypotension: Secondary | ICD-10-CM | POA: Diagnosis not present

## 2019-07-26 DIAGNOSIS — R0600 Dyspnea, unspecified: Secondary | ICD-10-CM

## 2019-07-26 DIAGNOSIS — I10 Essential (primary) hypertension: Secondary | ICD-10-CM | POA: Diagnosis not present

## 2019-07-26 DIAGNOSIS — R0609 Other forms of dyspnea: Secondary | ICD-10-CM

## 2019-07-26 NOTE — Progress Notes (Signed)
Primary Physician/Referring:  Sheila Morning, DO  Patient ID: Sheila Fernandez, female    DOB: Mar 09, 1951, 68 y.o.   MRN: [id number]  Chief Complaint  Patient presents with  . Hypertension  . Follow-up    6 month   HPI:    Sheila Fernandez  is a 68 y.o. AA female  with  hypertension, former tobacco use, followed by [place] for orthostatic hypotension. She has diagnosis of essential hypertension and also sickle cell trait.  She underwent echocardiogram in May 2019 revealing mild LVH and grade 1 diastolic dysfunction, otherwise essentially normal echocardiogram.   Symptoms of positional dizziness has resolved since changing from amlodipine to losartan. Feeling well. States BP is very well controlled. She denies any dyspnea or chest pain or palpitations.  Past Medical History:  Diagnosis Date  . Adenomyosis   . Arthritis   . Hypertension   . Iritis, recurrent   . Orthostatic hypotension 01/21/2019  . Uterine fibroid    Past Surgical History:  Procedure Laterality Date  . BREAST BIOPSY Left 1986   benign excisional no scar seen   . BREAST LUMPECTOMY Left    Pt does not recall having lumpectomy  . CESAREAN SECTION    . CHOLECYSTECTOMY    . FOOT SURGERY     Social History   Socioeconomic History  . Marital status: Divorced    Spouse name: Not on file  . Number of children: 7  . Years of education: Not on file  . Highest education level: Not on file  Occupational History  . Not on file  Social Needs  . Financial resource strain: Not on file  . Food insecurity    Worry: Not on file    Inability: Not on file  . Transportation needs    Medical: Not on file    Non-medical: Not on file  Tobacco Use  . Smoking status: Former Smoker    Packs/day: 1.00    Years: 15.00    Pack years: 15.00    Types: Cigarettes    Quit date: 11/28/2003    Years since quitting: 15.6  . Smokeless tobacco: Never Used  Substance and Sexual Activity  . Alcohol use: No  . Drug use: No  .  Sexual activity: Not Currently    Birth control/protection: None  Lifestyle  . Physical activity    Days per week: Not on file    Minutes per session: Not on file  . Stress: Not on file  Relationships  . Social Herbalist on phone: Not on file    Gets together: Not on file    Attends religious service: Not on file    Active member of club or organization: Not on file    Attends meetings of clubs or organizations: Not on file    Relationship status: Not on file  . Intimate partner violence    Fear of current or ex partner: Not on file    Emotionally abused: Not on file    Physically abused: Not on file    Forced sexual activity: Not on file  Other Topics Concern  . Not on file  Social History Narrative  . Not on file   ROS  Review of Systems  Constitution: Negative for chills, decreased appetite, malaise/fatigue and weight gain.  Cardiovascular: Negative for dyspnea on exertion, leg swelling and syncope.  Endocrine: Negative for cold intolerance.  Hematologic/Lymphatic: Does not bruise/bleed easily.  Musculoskeletal: Negative for joint swelling.  Gastrointestinal: Negative  for abdominal pain, anorexia and change in bowel habit.  Neurological: Negative for dizziness and light-headedness.  Psychiatric/Behavioral: Negative for depression and substance abuse.  All other systems reviewed and are negative.  Objective   Vitals with BMI 07/26/2019 01/21/2019 06/02/2018  Height '5\' 2"'  5' 2.5" '5\' 2"'   Weight 155 lbs 160 lbs 165 lbs 8 oz  BMI 28.34 25.49 82.64  Systolic - 158 -  Diastolic - 82 -  Pulse - - -    New Set of Vitals Flowsheets   Older Vitals  07/26/19 3:44 PM  07/26/19 3:45 PM  07/26/19 4:02 PM   BP    177/97   Orthostatic BP  165/97  147/96    BP Location  LeftArm  LeftArm  LeftArm   Patient Position  Sitting  Standing  Supine   Cuff Size  Small  Small  Normal   Pulse    59   Orthostatic Pulse  63  75    SpO2  98%  98%         Physical Exam  Constitutional: She appears well-developed. No distress.  Mildly obese  HENT:  Head: Atraumatic.  Eyes: Conjunctivae are normal.  Neck: Neck supple. No JVD present. No thyromegaly present.  Cardiovascular: Normal rate, regular rhythm, normal heart sounds and intact distal pulses. Exam reveals no gallop.  No murmur heard. Pulmonary/Chest: Effort normal and breath sounds normal.  Abdominal: Soft. Bowel sounds are normal.  Musculoskeletal: Normal range of motion.        General: No edema.  Neurological: She is alert.  Skin: Skin is warm and dry.  Psychiatric: She has a normal mood and affect.   Laboratory examination:   01/26/2018: Creatinine 0.85, EGFR 82, potassium 4.1, BMP normal. 11/17/2017: CBC normal. Glucose 126, Creatinine 1.0, EGFR 60/72, potassium 3.7, CMP normal.  No results for input(s): NA, K, CL, CO2, GLUCOSE, BUN, CREATININE, CALCIUM, GFRNONAA, GFRAA in the last 8760 hours. CMP Latest Ref Rng & Units 06/10/2017 06/10/2017 03/06/2015  Glucose 70 - 140 mg/dl 93 - 108(H)  BUN 7.0 - 26.0 mg/dL 18.5 - 16  Creatinine 0.6 - 1.1 mg/dL 0.8 - 0.71  Sodium 136 - 145 mEq/L 138 - 139  Potassium 3.5 - 5.1 mEq/L 4.0 - 3.8  Chloride 96 - 112 mEq/L - - 108  CO2 22 - 29 mEq/L 23 - 23  Calcium 8.4 - 10.4 mg/dL 9.8 - 9.3  Total Protein 6.0 - 8.5 g/dL 7.6 7.1 7.3  Total Bilirubin 0.20 - 1.20 mg/dL 1.29(H) - 0.7  Alkaline Phos 40 - 150 U/L 88 - 98  AST 5 - 34 U/L 17 - 11  ALT 0 - 55 U/L 21 - 13   CBC Latest Ref Rng & Units 06/10/2017 09/06/2013 09/05/2013  WBC 3.9 - 10.3 10e3/uL 4.6 11.5(H) 7.1  Hemoglobin 11.6 - 15.9 g/dL 12.1 11.0(L) 10.8(L)  Hematocrit 34.8 - 46.6 % 36.7 32.9(L) 33.3(L)  Platelets 145 - 400 10e3/uL 227 267 263   Lipid Panel     Component Value Date/Time   CHOL 149 03/06/2015 0826   TRIG 120 03/06/2015 0826   HDL 40 (L) 03/06/2015 0826   CHOLHDL 3.7 03/06/2015 0826   VLDL 24 03/06/2015 0826   LDLCALC 85 03/06/2015 0826   LDLDIRECT 60 08/15/2010 2040    HEMOGLOBIN A1C Lab Results  Component Value Date   HGBA1C 5.5 04/16/2015   MPG 111 09/06/2013   TSH No results for input(s): TSH in the last 8760 hours. Medications and allergies  Allergies  Allergen Reactions  . Codeine Nausea And Vomiting     Prior to Admission medications   Medication Sig Start Date End Date Taking? Authorizing Provider  carvedilol (COREG) 25 MG tablet Take 12.5 mg by mouth daily.   Yes [provider]  Cholecalciferol (VITAMIN D PO) Take 1 tablet by mouth daily.   Yes [provider]  co-enzyme Q-10 30 MG capsule Take 30 mg by mouth 3 (three) times daily.   Yes [provider]  losartan (COZAAR) 100 MG tablet Take 50 mg by mouth daily.   Yes [provider]  MAGNESIUM PO Take 1 tablet by mouth daily.   Yes [provider]  Misc Natural Products (DANDELION ROOT PO) Take by mouth.   Yes [provider]  prednisoLONE acetate (OMNIPRED) 1 % ophthalmic suspension Place 2 drops into the left eye 4 (four) times daily. Patient taking differently: Place 2 drops into the left eye as needed.  03/20/14  Yes Angelica Ran, MD     Current Outpatient Medications  Medication Instructions  . carvedilol (COREG) 12.5 mg, Oral, Daily  . Cholecalciferol (VITAMIN D PO) 1 tablet, Oral, Daily  . co-enzyme Q-10 30 mg, Oral, 3 times daily  . losartan (COZAAR) 50 mg, Oral, Daily  . MAGNESIUM PO 1 tablet, Oral, Daily  . Misc Natural Products (DANDELION ROOT PO) Oral  . prednisoLONE acetate (OMNIPRED) 1 % ophthalmic suspension 2 drops, Left Eye, 4 times daily    Radiology:  No results found.  Cardiac Studies:   Echocardiogram 02/16/2018: Left ventricle cavity is normal in size. Mild concentric hypertrophy of the left ventricle. Normal global wall motion. Doppler evidence of grade I (impaired) diastolic dysfunction, normal LAP. Calculated EF 76%. Mild tricuspid regurgitation.  No evidence of pulmonary hypertension.   Assessment     ICD-10-CM   1. Orthostatic hypotension  I95.1   2. HYPERTENSION, BENIGN SYSTEMIC  I10   3. Dyspnea on exertion  R06.00    EKG 0/18/2019: Normal sinus rhythm at the rate of 73 bpm, left atrial enlargement, otherwise normal EKG.  Recommendations:   Patient is here on a six-month office visit and follow-up of supine hypertension and orthostatic hypotension, she is essentially asymptomatic and states that her blood pressure has been very well controlled.  Today the blood pressure was high in spite of repeating the blood pressure,  However patient states that her blood pressure has been very well controlled and she has been keeping an eye at home.  Hence I did not make any changes.  She still has mild orthostatic changes but essentially asymptomatic.  Probably best option is not to be too aggressive however advised her that if blood pressure is greater than 140/80 mmHg in a sitting position to contact us and we could certainly increases her present medications especially on losartan.    Would avoid amlodipine or any other vasodilatory agents in view of orthostatic hypotension.  I'll see her back on a p.r.n. basis.  Adrian Prows, MD, [facility] 07/26/2019, 4:05 PM [facility]. [place] Pager: [phone] Office: [phone] If no answer Cell [phone]

## 2019-08-05 DIAGNOSIS — K5900 Constipation, unspecified: Secondary | ICD-10-CM | POA: Diagnosis not present

## 2019-08-05 DIAGNOSIS — I10 Essential (primary) hypertension: Secondary | ICD-10-CM | POA: Diagnosis not present

## 2019-08-05 DIAGNOSIS — Z7189 Other specified counseling: Secondary | ICD-10-CM | POA: Diagnosis not present

## 2019-08-05 DIAGNOSIS — Z23 Encounter for immunization: Secondary | ICD-10-CM | POA: Diagnosis not present

## 2019-08-30 DIAGNOSIS — K5900 Constipation, unspecified: Secondary | ICD-10-CM | POA: Diagnosis not present

## 2019-08-30 DIAGNOSIS — I10 Essential (primary) hypertension: Secondary | ICD-10-CM | POA: Diagnosis not present

## 2019-09-15 DIAGNOSIS — Z719 Counseling, unspecified: Secondary | ICD-10-CM | POA: Diagnosis not present

## 2019-10-06 ENCOUNTER — Ambulatory Visit: Payer: Medicare Other | Attending: Internal Medicine

## 2019-10-06 DIAGNOSIS — Z20822 Contact with and (suspected) exposure to covid-19: Secondary | ICD-10-CM | POA: Diagnosis not present

## 2019-10-08 LAB — NOVEL CORONAVIRUS, NAA: SARS-CoV-2, NAA: NOT DETECTED

## 2019-11-08 DIAGNOSIS — H2513 Age-related nuclear cataract, bilateral: Secondary | ICD-10-CM | POA: Diagnosis not present

## 2019-11-08 DIAGNOSIS — H2000 Unspecified acute and subacute iridocyclitis: Secondary | ICD-10-CM | POA: Diagnosis not present

## 2019-11-08 DIAGNOSIS — H25013 Cortical age-related cataract, bilateral: Secondary | ICD-10-CM | POA: Diagnosis not present

## 2019-11-08 DIAGNOSIS — H40023 Open angle with borderline findings, high risk, bilateral: Secondary | ICD-10-CM | POA: Diagnosis not present

## 2019-11-14 ENCOUNTER — Other Ambulatory Visit: Payer: Self-pay | Admitting: Family Medicine

## 2019-11-14 ENCOUNTER — Other Ambulatory Visit: Payer: Self-pay | Admitting: Internal Medicine

## 2019-11-14 DIAGNOSIS — Z1231 Encounter for screening mammogram for malignant neoplasm of breast: Secondary | ICD-10-CM

## 2019-12-22 ENCOUNTER — Other Ambulatory Visit: Payer: Self-pay

## 2019-12-22 ENCOUNTER — Ambulatory Visit
Admission: RE | Admit: 2019-12-22 | Discharge: 2019-12-22 | Disposition: A | Discharge status: Home / Self Care | Payer: Medicare Other | Source: Ambulatory Visit | Attending: Family Medicine | Admitting: Family Medicine

## 2019-12-22 DIAGNOSIS — Z1231 Encounter for screening mammogram for malignant neoplasm of breast: Secondary | ICD-10-CM

## 2020-02-03 ENCOUNTER — Other Ambulatory Visit: Payer: Self-pay | Admitting: Cardiology

## 2020-02-09 DIAGNOSIS — Z0000 Encounter for general adult medical examination without abnormal findings: Secondary | ICD-10-CM | POA: Diagnosis not present

## 2020-02-09 DIAGNOSIS — E559 Vitamin D deficiency, unspecified: Secondary | ICD-10-CM | POA: Diagnosis not present

## 2020-02-09 DIAGNOSIS — I10 Essential (primary) hypertension: Secondary | ICD-10-CM | POA: Diagnosis not present

## 2020-02-09 DIAGNOSIS — R7309 Other abnormal glucose: Secondary | ICD-10-CM | POA: Diagnosis not present

## 2020-02-16 DIAGNOSIS — Z0000 Encounter for general adult medical examination without abnormal findings: Secondary | ICD-10-CM | POA: Diagnosis not present

## 2020-02-16 DIAGNOSIS — I10 Essential (primary) hypertension: Secondary | ICD-10-CM | POA: Diagnosis not present

## 2020-02-16 DIAGNOSIS — E559 Vitamin D deficiency, unspecified: Secondary | ICD-10-CM | POA: Diagnosis not present

## 2020-04-04 DIAGNOSIS — R590 Localized enlarged lymph nodes: Secondary | ICD-10-CM | POA: Diagnosis not present

## 2020-04-04 DIAGNOSIS — L299 Pruritus, unspecified: Secondary | ICD-10-CM | POA: Diagnosis not present

## 2020-04-26 DIAGNOSIS — M1990 Unspecified osteoarthritis, unspecified site: Secondary | ICD-10-CM | POA: Diagnosis not present

## 2020-04-26 DIAGNOSIS — M2550 Pain in unspecified joint: Secondary | ICD-10-CM | POA: Diagnosis not present

## 2020-04-26 DIAGNOSIS — M25569 Pain in unspecified knee: Secondary | ICD-10-CM | POA: Diagnosis not present

## 2020-04-26 DIAGNOSIS — H209 Unspecified iridocyclitis: Secondary | ICD-10-CM | POA: Diagnosis not present

## 2020-04-26 DIAGNOSIS — M549 Dorsalgia, unspecified: Secondary | ICD-10-CM | POA: Diagnosis not present

## 2020-04-26 DIAGNOSIS — M47814 Spondylosis without myelopathy or radiculopathy, thoracic region: Secondary | ICD-10-CM | POA: Diagnosis not present

## 2020-04-26 DIAGNOSIS — M4184 Other forms of scoliosis, thoracic region: Secondary | ICD-10-CM | POA: Diagnosis not present

## 2020-05-08 DIAGNOSIS — H40023 Open angle with borderline findings, high risk, bilateral: Secondary | ICD-10-CM | POA: Diagnosis not present

## 2020-05-08 DIAGNOSIS — H2513 Age-related nuclear cataract, bilateral: Secondary | ICD-10-CM | POA: Diagnosis not present

## 2020-05-08 DIAGNOSIS — H35363 Drusen (degenerative) of macula, bilateral: Secondary | ICD-10-CM | POA: Diagnosis not present

## 2020-05-08 DIAGNOSIS — H25013 Cortical age-related cataract, bilateral: Secondary | ICD-10-CM | POA: Diagnosis not present

## 2020-06-12 DIAGNOSIS — Z20822 Contact with and (suspected) exposure to covid-19: Secondary | ICD-10-CM | POA: Diagnosis not present

## 2020-07-03 ENCOUNTER — Other Ambulatory Visit: Payer: Self-pay

## 2020-07-03 ENCOUNTER — Ambulatory Visit (AMBULATORY_SURGERY_CENTER): Payer: Self-pay | Admitting: *Deleted

## 2020-07-03 VITALS — Ht 62.0 in | Wt 159.0 lb

## 2020-07-03 DIAGNOSIS — Z1211 Encounter for screening for malignant neoplasm of colon: Secondary | ICD-10-CM

## 2020-07-03 NOTE — Progress Notes (Signed)

## 2020-07-17 ENCOUNTER — Encounter: Payer: Self-pay | Admitting: Gastroenterology

## 2020-07-17 ENCOUNTER — Ambulatory Visit (AMBULATORY_SURGERY_CENTER): Payer: Medicare Other | Admitting: Gastroenterology

## 2020-07-17 ENCOUNTER — Other Ambulatory Visit: Payer: Self-pay

## 2020-07-17 VITALS — BP 110/80 | HR 60 | Temp 97.3°F | Resp 11 | Ht 62.0 in | Wt 159.0 lb

## 2020-07-17 DIAGNOSIS — Z1211 Encounter for screening for malignant neoplasm of colon: Secondary | ICD-10-CM

## 2020-07-17 MED ORDER — SODIUM CHLORIDE 0.9 % IV SOLN: 500.0000 mL | Freq: Once | INTRAVENOUS | Status: DC

## 2020-07-17 NOTE — Patient Instructions (Signed)
Information on hemorrhoids given to you today. ° °Resume previous diet and medications. ° °Repeat colonoscopy in 10 years. ° ° °YOU HAD AN ENDOSCOPIC PROCEDURE TODAY AT THE [facility]:   Refer to the procedure report that was given to you for any specific questions about what was found during the examination.  If the procedure report does not answer your questions, please call your gastroenterologist to clarify.  If you requested that your care partner not be given the details of your procedure findings, then the procedure report has been included in a sealed envelope for you to review at your convenience later. ° °YOU SHOULD EXPECT: Some feelings of bloating in the abdomen. Passage of more gas than usual.  Walking can help get rid of the air that was put into your GI tract during the procedure and reduce the bloating. If you had a lower endoscopy (such as a colonoscopy or flexible sigmoidoscopy) you may notice spotting of blood in your stool or on the toilet paper. If you underwent a bowel prep for your procedure, you may not have a normal bowel movement for a few days. ° °Please Note:  You might notice some irritation and congestion in your nose or some drainage.  This is from the oxygen used during your procedure.  There is no need for concern and it should clear up in a day or so. ° °SYMPTOMS TO REPORT IMMEDIATELY: ° °Following lower endoscopy (colonoscopy or flexible sigmoidoscopy): ° Excessive amounts of blood in the stool ° Significant tenderness or worsening of abdominal pains ° Swelling of the abdomen that is new, acute ° Fever of 100°F or higher ° ° ° °For urgent or emergent issues, a gastroenterologist can be reached at any hour by calling [phone]. °Do not use MyChart messaging for urgent concerns.  ° ° °DIET:  We do recommend a small meal at first, but then you may proceed to your regular diet.  Drink plenty of fluids but you should avoid alcoholic beverages for 24  hours. ° °ACTIVITY:  You should plan to take it easy for the rest of today and you should NOT DRIVE or use heavy machinery until tomorrow (because of the sedation medicines used during the test).   ° °FOLLOW UP: °Our staff will call the number listed on your records 48-72 hours following your procedure to check on you and address any questions or concerns that you may have regarding the information given to you following your procedure. If we do not reach you, we will leave a message.  We will attempt to reach you two times.  During this call, we will ask if you have developed any symptoms of COVID 19. If you develop any symptoms (ie: fever, flu-like symptoms, shortness of breath, cough etc.) before then, please call [phone].  If you test positive for Covid 19 in the 2 weeks post procedure, please call and report this information to us.   ° °If any biopsies were taken you will be contacted by phone or by letter within the next 1-3 weeks.  Please call us at [phone] if you have not heard about the biopsies in 3 weeks.  ° ° °SIGNATURES/CONFIDENTIALITY: °You and/or your care partner have signed paperwork which will be entered into your electronic medical record.  These signatures attest to the fact that that the information above on your After Visit Summary has been reviewed and is understood.  Full responsibility of the confidentiality of this discharge information lies with you   your care-partner. 

## 2020-07-17 NOTE — Op Note (Signed)
[facility] Patient Name: Sheila Fernandez Procedure Date: 07/17/2020 11:43 AM MRN: [id number] Endoscopist: Milus Banister , MD Age: 69 Referring MD:  Date of Birth: 04-23-1951 Gender: Female Account #: [id number] Procedure:                Colonoscopy Indications:              Screening for colorectal malignant neoplasm Medicines:                Monitored Anesthesia Care Procedure:                Pre-Anesthesia Assessment:                           - Prior to the procedure, a History and Physical                            was performed, and patient medications and                            allergies were reviewed. The patient's tolerance of                            previous anesthesia was also reviewed. The risks                            and benefits of the procedure and the sedation                            options and risks were discussed with the patient.                            All questions were answered, and informed consent                            was obtained. Prior Anticoagulants: The patient has                            taken no previous anticoagulant or antiplatelet                            agents. ASA Grade Assessment: II - A patient with                            mild systemic disease. After reviewing the risks                            and benefits, the patient was deemed in                            satisfactory condition to undergo the procedure.                           After obtaining informed consent, the colonoscope  was passed under direct vision. Throughout the                            procedure, the patient's blood pressure, pulse, and                            oxygen saturations were monitored continuously. The                            Colonoscope was introduced through the anus and                            advanced to the the cecum, identified by                            appendiceal orifice  and ileocecal valve. The                            colonoscopy was performed without difficulty. The                            patient tolerated the procedure well. The quality                            of the bowel preparation was good. The ileocecal                            valve, appendiceal orifice, and rectum were                            photographed. Scope In: 11:48:19 AM Scope Out: 12:03:51 PM Scope Withdrawal Time: 0 hours 6 minutes 47 seconds  Total Procedure Duration: 0 hours 15 minutes 32 seconds  Findings:                 External and internal hemorrhoids were found. The                            hemorrhoids were medium-sized.                           The exam was otherwise without abnormality on                            direct and retroflexion views. Complications:            No immediate complications. Estimated blood loss:                            None. Estimated Blood Loss:     Estimated blood loss: none. Impression:               - External and internal hemorrhoids.                           - The examination was otherwise normal on direct  and retroflexion views.                           - No polyps or cancers. Recommendation:           - Patient has a contact number available for                            emergencies. The signs and symptoms of potential                            delayed complications were discussed with the                            patient. Return to normal activities tomorrow.                            Written discharge instructions were provided to the                            patient.                           - Resume previous diet.                           - Continue present medications.                           - Repeat colonoscopy in 10 years for screening. Milus Banister, MD 07/17/2020 12:05:40 PM This report has been signed electronically.

## 2020-07-17 NOTE — Progress Notes (Signed)
Report to PACU, RN, vss, BBS= Clear.  

## 2020-07-17 NOTE — Progress Notes (Signed)
Pt's states no medical or surgical changes since previsit or office visit. 

## 2020-07-19 ENCOUNTER — Telehealth: Payer: Self-pay | Admitting: *Deleted

## 2020-07-19 NOTE — Telephone Encounter (Signed)
  Follow up Call-  Call back number 07/17/2020  Post procedure Call Back phone  # [phone]  Permission to leave phone message Yes  Some recent data might be hidden     No answer at 2nd attempt follow up phone call.  Left message on voicemail.

## 2020-07-19 NOTE — Telephone Encounter (Signed)
  Follow up Call-  Call back number 07/17/2020  Post procedure Call Back phone  # [phone]  Permission to leave phone message Yes  Some recent data might be hidden     First attempt for follow up phone call. No answer at number given.  Left message on voicemail.

## 2020-07-31 ENCOUNTER — Other Ambulatory Visit: Payer: Self-pay | Admitting: Cardiology

## 2020-08-03 DIAGNOSIS — Z719 Counseling, unspecified: Secondary | ICD-10-CM | POA: Diagnosis not present

## 2020-08-26 ENCOUNTER — Other Ambulatory Visit: Payer: Self-pay | Admitting: Cardiology

## 2020-09-09 DIAGNOSIS — Z20822 Contact with and (suspected) exposure to covid-19: Secondary | ICD-10-CM | POA: Diagnosis not present

## 2020-10-03 ENCOUNTER — Other Ambulatory Visit: Payer: Self-pay | Admitting: Cardiology

## 2020-11-12 DIAGNOSIS — H40023 Open angle with borderline findings, high risk, bilateral: Secondary | ICD-10-CM | POA: Diagnosis not present

## 2020-11-12 DIAGNOSIS — H2000 Unspecified acute and subacute iridocyclitis: Secondary | ICD-10-CM | POA: Diagnosis not present

## 2020-11-29 ENCOUNTER — Other Ambulatory Visit: Payer: Self-pay | Admitting: Family Medicine

## 2020-11-29 DIAGNOSIS — Z1231 Encounter for screening mammogram for malignant neoplasm of breast: Secondary | ICD-10-CM

## 2021-01-22 ENCOUNTER — Other Ambulatory Visit: Payer: Self-pay

## 2021-01-22 ENCOUNTER — Ambulatory Visit
Admission: RE | Admit: 2021-01-22 | Discharge: 2021-01-22 | Disposition: A | Discharge status: Home / Self Care | Payer: Medicare Other | Source: Ambulatory Visit | Attending: Family Medicine | Admitting: Family Medicine

## 2021-01-22 DIAGNOSIS — Z1231 Encounter for screening mammogram for malignant neoplasm of breast: Secondary | ICD-10-CM

## 2021-02-12 DIAGNOSIS — R7309 Other abnormal glucose: Secondary | ICD-10-CM | POA: Diagnosis not present

## 2021-02-12 DIAGNOSIS — I10 Essential (primary) hypertension: Secondary | ICD-10-CM | POA: Diagnosis not present

## 2021-02-12 DIAGNOSIS — Z0000 Encounter for general adult medical examination without abnormal findings: Secondary | ICD-10-CM | POA: Diagnosis not present

## 2021-02-12 DIAGNOSIS — D649 Anemia, unspecified: Secondary | ICD-10-CM | POA: Diagnosis not present

## 2021-02-12 DIAGNOSIS — R946 Abnormal results of thyroid function studies: Secondary | ICD-10-CM | POA: Diagnosis not present

## 2021-02-12 DIAGNOSIS — R739 Hyperglycemia, unspecified: Secondary | ICD-10-CM | POA: Diagnosis not present

## 2021-02-12 DIAGNOSIS — E559 Vitamin D deficiency, unspecified: Secondary | ICD-10-CM | POA: Diagnosis not present

## 2021-02-19 DIAGNOSIS — L298 Other pruritus: Secondary | ICD-10-CM | POA: Diagnosis not present

## 2021-02-19 DIAGNOSIS — Z0000 Encounter for general adult medical examination without abnormal findings: Secondary | ICD-10-CM | POA: Diagnosis not present

## 2021-05-14 DIAGNOSIS — H40023 Open angle with borderline findings, high risk, bilateral: Secondary | ICD-10-CM | POA: Diagnosis not present

## 2021-05-14 DIAGNOSIS — H2513 Age-related nuclear cataract, bilateral: Secondary | ICD-10-CM | POA: Diagnosis not present

## 2021-05-14 DIAGNOSIS — H35363 Drusen (degenerative) of macula, bilateral: Secondary | ICD-10-CM | POA: Diagnosis not present

## 2021-05-14 DIAGNOSIS — H2000 Unspecified acute and subacute iridocyclitis: Secondary | ICD-10-CM | POA: Diagnosis not present

## 2021-07-17 DIAGNOSIS — H5213 Myopia, bilateral: Secondary | ICD-10-CM | POA: Diagnosis not present

## 2021-07-17 DIAGNOSIS — H5203 Hypermetropia, bilateral: Secondary | ICD-10-CM | POA: Diagnosis not present

## 2021-07-17 DIAGNOSIS — H52223 Regular astigmatism, bilateral: Secondary | ICD-10-CM | POA: Diagnosis not present

## 2021-11-25 NOTE — Progress Notes (Signed)
a  NEUROLOGY CONSULTATION NOTE  Sheila Fernandez MRN: [id number] DOB: Sep 03, 1951  Referring provider: Janit Pagan, MD Primary care provider: [name], DO  Reason for consult:  right hand weakness  Assessment/Plan:   1  Bilateral upper extremity weakness (proximal and distal) worse on the right - concern for underlying neuromuscular disorder.  Also consider cervical spine etiology although no convincing signs of myelopathy on exam. 2  Hypertension - significantly elevated for past 3 weeks.    Check CK and aldolase Check NCV-EMG upper extremities Further recommendations pending results.  Consider MRI of cervical spine. Follow up after testing. Advised to contact PCP today regarding elevated blood pressure   Subjective:  Sheila Fernandez is a 71 year old right-handed female with HTN, osteoarthritis, sickle cell trait and recurrent iritis who presents for right hand weakness.  History supplemented by referring provider's note.  A couple of months ago she started having right hand weakness that gradually got worse.  Trouble gripping with palm and fingers.  The pinky finger is extended and bent at the distal interphalangeal joint.  Reports that when using either hand, her hands will cramp, worse on the right.  No numbness or tingling.  Denies neck pain.  Once in awhile may notice pain down the right arm.  Sometimes has cramps in her calves.     She had an MRI of the brain without contrast back on 01/19/2018 which was personally reviewed and revealed age-appropriate minimal chronic small vessel ischemic changes but overall unremarkable.    PAST MEDICAL HISTORY: Past Medical History:  Diagnosis Date   Adenomyosis    Anemia    Arthritis    Hypertension    Iritis, recurrent    Orthostatic hypotension 01/21/2019   Sickle cell trait ([place])    Uterine fibroid     PAST SURGICAL HISTORY: Past Surgical History:  Procedure Laterality Date   BREAST BIOPSY Left 1986   benign  excisional no scar seen    BREAST LUMPECTOMY Left    Pt does not recall having lumpectomy   CESAREAN SECTION     CHOLECYSTECTOMY     FOOT SURGERY     cyst removed    MEDICATIONS: Current Outpatient Medications on File Prior to Visit  Medication Sig Dispense Refill   carvedilol (COREG) 25 MG tablet Take 12.5 mg by mouth daily.     Cholecalciferol (VITAMIN D PO) Take 1 tablet by mouth daily.     clobetasol cream (TEMOVATE) 0.05 % Apply topically 2 (two) times daily.     co-enzyme Q-10 30 MG capsule Take 30 mg by mouth 3 (three) times daily.     losartan (COZAAR) 100 MG tablet Take 1 tablet (100 mg total) by mouth daily. Pt needs to schedule a f/u appt for further refills. 30 tablet 1   MAGNESIUM PO Take 1 tablet by mouth daily.     Misc Natural Products (DANDELION ROOT PO) Take by mouth.     prednisoLONE acetate (OMNIPRED) 1 % ophthalmic suspension Place 2 drops into the left eye 4 (four) times daily. (Patient not taking: Reported on 07/03/2020) 5 mL 1   No current facility-administered medications on file prior to visit.    ALLERGIES: Allergies  Allergen Reactions   Codeine Nausea And Vomiting    FAMILY HISTORY: Family History  Problem Relation Age of Onset   Cancer Mother 36       Pancreatic   Hypertension Mother    Stroke Father    Cancer Father 53  Prostate   Hypertension Father    Diabetes Sister    Diabetes Brother    Hypertension Brother    Kidney disease Brother    Lupus Daughter    Diabetes Maternal Grandmother    HIV Sister    Cancer Sister        Cancer r/t HIV   Cancer Brother        Prostate   Colon cancer Neg Hx    Esophageal cancer Neg Hx    Stomach cancer Neg Hx    Colon polyps Neg Hx     Objective:  Blood pressure (!) 188/100, pulse 71, height 5\' 1"  (1.549 m), weight 130 lb (59 kg), SpO2 98 %.  General: No acute distress.  Patient appears well-groomed.   Head:  Normocephalic/atraumatic Eyes:  fundi examined but not visualized Neck:  supple, no paraspinal tenderness, full range of motion Back: No paraspinal tenderness Heart: regular rate and rhythm Lungs: Clear to auscultation bilaterally. Vascular: No carotid bruits. Neurological Exam: Mental status: alert and oriented to person, place, and time, recent and remote memory intact, fund of knowledge intact, attention and concentration intact, speech fluent and not dysarthric, language intact. Cranial nerves: CN I: not tested CN II: pupils equal, round and reactive to light, visual fields intact CN III, IV, VI:  full range of motion, no nystagmus, no ptosis CN V: facial sensation intact. CN VII: upper and lower face symmetric CN VIII: hearing intact CN IX, X: gag intact, uvula midline CN XI: sternocleidomastoid and trapezius muscles intact CN XII: tongue midline Bulk & Tone: normal, no fasciculations. Motor:  right 5th digit stiff with flexion at the distal interphalangeal joint.  4/5 bilateral deltoid, 5-/5 right bicep, 4+/5 right tricep, 3/5 bilateral wrist flexion, 4/5 left interossei, 3/5 right interossei, 5-/5 left wrist flexion, 4/5 right wrist flexion, 3/5 bilateral APBs.  Otherwise, 5/5.  Myotonia absent. Sensation:  Pinprick, temperature and vibratory sensation intact. Deep Tendon Reflexes:  2+ throughout,  toes downgoing.   Finger to nose testing:  Without dysmetria.   Heel to shin:  Without dysmetria.   Gait:  Normal station and stride.  Romberg negative.    Thank you for allowing me to take part in the care of this patient.  Metta Clines, DO  CC:  Margretta Sidle, MD  Lahoma Rocker, MD

## 2021-11-26 ENCOUNTER — Other Ambulatory Visit: Payer: Self-pay

## 2021-11-26 ENCOUNTER — Ambulatory Visit (INDEPENDENT_AMBULATORY_CARE_PROVIDER_SITE_OTHER): Payer: Medicare Other | Admitting: Neurology

## 2021-11-26 ENCOUNTER — Other Ambulatory Visit (INDEPENDENT_AMBULATORY_CARE_PROVIDER_SITE_OTHER): Payer: Medicare Other

## 2021-11-26 ENCOUNTER — Encounter: Payer: Self-pay | Admitting: Neurology

## 2021-11-26 VITALS — BP 177/96 | HR 71 | Ht 61.0 in | Wt 130.0 lb

## 2021-11-26 DIAGNOSIS — M62838 Other muscle spasm: Secondary | ICD-10-CM

## 2021-11-26 DIAGNOSIS — M6281 Muscle weakness (generalized): Secondary | ICD-10-CM

## 2021-11-26 LAB — CK: Total CK: 161 U/L (ref 7–177)

## 2021-11-26 NOTE — Patient Instructions (Signed)
Check CK, aldolase Nerve conduction study of bilateral upper extremities Follow up after testing.  Further recommendations pending results.

## 2021-11-27 LAB — ALDOLASE: Aldolase: 3.6 U/L (ref <=8.1)

## 2021-11-28 ENCOUNTER — Encounter: Payer: Self-pay | Admitting: Cardiology

## 2021-11-28 ENCOUNTER — Other Ambulatory Visit: Payer: Self-pay

## 2021-11-28 ENCOUNTER — Ambulatory Visit: Payer: Medicare Other | Admitting: Cardiology

## 2021-11-28 VITALS — BP 160/83 | HR 64 | Temp 98.1°F | Resp 16 | Ht 61.0 in | Wt 131.6 lb

## 2021-11-28 DIAGNOSIS — I951 Orthostatic hypotension: Secondary | ICD-10-CM

## 2021-11-28 DIAGNOSIS — I10 Essential (primary) hypertension: Secondary | ICD-10-CM

## 2021-11-28 NOTE — Progress Notes (Signed)
LMOVM for pt. Please call the office back.

## 2021-11-28 NOTE — Progress Notes (Signed)
Primary Physician/Referring:  Margretta Sidle, MD  Patient ID: Sheila Fernandez, female    DOB: 02/08/1951, 71 y.o.   MRN: [id number]  Chief Complaint  Patient presents with   Orthostatic hypotension   Dizziness   HPI:    Sheila Fernandez  is a 71 y.o. AA female  with  hypertension, former tobacco use, followed by [place] for orthostatic hypotension. She has diagnosis of essential hypertension and also sickle cell trait, she is recently being evaluated for right hand weakness by Dr. Metta Clines, neurology on 11/26/2021.  She was evaluated by Dr. Royal Piedra with [facility] and referred to me for evaluation of chest pain, hypertension management.  Patient is now being followed by Dr. Tamsen Meek at [facility].  Except for an episode of chest pain 2 months ago with no recurrence, patient has occasional episodes of dizziness and is worried about elevated blood pressure.  She has no other specific symptoms.  Denies dyspnea, PND or orthopnea or leg edema.  Past Medical History:  Diagnosis Date   Adenomyosis    Anemia    Arthritis    Hypertension    Iritis, recurrent    Orthostatic hypotension 01/21/2019   Sickle cell trait ([place])    Uterine fibroid    Past Surgical History:  Procedure Laterality Date   BREAST BIOPSY Left 1986   benign excisional no scar seen    BREAST LUMPECTOMY Left    Pt does not recall having lumpectomy   CESAREAN SECTION     CHOLECYSTECTOMY     FOOT SURGERY     cyst removed   Family History  Problem Relation Age of Onset   Cancer Mother 88       Pancreatic   Hypertension Mother    Stroke Father    Cancer Father 67       Prostate   Hypertension Father    Diabetes Sister    Diabetes Brother    Hypertension Brother    Kidney disease Brother    Lupus Daughter    Diabetes Maternal Grandmother    HIV Sister    Cancer Sister        Cancer r/t HIV   Cancer Brother        Prostate   Colon cancer Neg Hx    Esophageal cancer Neg Hx    Stomach cancer  Neg Hx    Colon polyps Neg Hx     Social History   Tobacco Use   Smoking status: Former    Packs/day: 1.00    Years: 15.00    Pack years: 15.00    Types: Cigarettes    Quit date: 11/28/2003    Years since quitting: 18.0   Smokeless tobacco: Never  Substance Use Topics   Alcohol use: No   Marital Status: Divorced  ROS  Review of Systems  Cardiovascular:  Negative for chest pain, dyspnea on exertion and leg swelling.  Neurological:  Positive for dizziness.  Objective  Blood pressure (!) 160/83, pulse 64, temperature 98.1 F (36.7 C), temperature source Temporal, resp. rate 16, height 5\' 1"  (1.549 m), weight 131 lb 9.6 oz (59.7 kg), SpO2 96 %. Body mass index is 24.87 kg/m.  Vitals with BMI 11/28/2021 11/26/2021 11/26/2021  Height 5\' 1"  - 5\' 1"   Weight 131 lbs 10 oz - 130 lbs  BMI 60.73 - 71.06  Systolic 269 485 462  Diastolic 83 96 703  Pulse 64 - 71    Orthostatic VS for the  past 72 hrs (Last 3 readings):  Orthostatic BP Patient Position BP Location Cuff Size Orthostatic Pulse  11/28/21 0925 120/70 Standing Left Arm Normal 74  11/28/21 0924 140/80 Sitting Left Arm Normal 72  11/28/21 0923 160/90 Supine Left Arm Normal 62    Physical Exam Neck:     Vascular: No JVD.  Cardiovascular:     Rate and Rhythm: Normal rate and regular rhythm.     Pulses: Intact distal pulses.     Heart sounds: Normal heart sounds. No murmur heard.   No gallop.  Pulmonary:     Effort: Pulmonary effort is normal.     Breath sounds: Normal breath sounds.  Abdominal:     General: Bowel sounds are normal.     Palpations: Abdomen is soft.  Musculoskeletal:     Right lower leg: No edema.     Left lower leg: No edema.     Medications and allergies   Allergies  Allergen Reactions   Codeine Nausea And Vomiting     Medication list after today's encounter   Current Outpatient Medications:    amLODipine (NORVASC) 2.5 MG tablet, Take 2.5 mg by mouth daily., Disp: , Rfl:    carvedilol (COREG)  25 MG tablet, Take 25 mg by mouth 2 (two) times daily with a meal., Disp: , Rfl:    Cholecalciferol (VITAMIN D PO), Take 1 tablet by mouth daily., Disp: , Rfl:    co-enzyme Q-10 30 MG capsule, Take 30 mg by mouth 3 (three) times daily., Disp: , Rfl:    losartan (COZAAR) 100 MG tablet, Take 1 tablet (100 mg total) by mouth daily. Pt needs to schedule a f/u appt for further refills., Disp: 30 tablet, Rfl: 1   Misc Natural Products (DANDELION ROOT PO), Take by mouth., Disp: , Rfl:    prednisoLONE acetate (OMNIPRED) 1 % ophthalmic suspension, Place 2 drops into the left eye 4 (four) times daily., Disp: 5 mL, Rfl: 1  Laboratory examination:   No results for input(s): NA, K, CL, CO2, GLUCOSE, BUN, CREATININE, CALCIUM, GFRNONAA, GFRAA in the last 8760 hours. CrCl cannot be calculated (Patient's most recent lab result is older than the maximum 21 days allowed.).  CMP Latest Ref Rng & Units 06/10/2017 06/10/2017 03/06/2015  Glucose 70 - 140 mg/dl 93 - 108(H)  BUN 7.0 - 26.0 mg/dL 18.5 - 16  Creatinine 0.6 - 1.1 mg/dL 0.8 - 0.71  Sodium 136 - 145 mEq/L 138 - 139  Potassium 3.5 - 5.1 mEq/L 4.0 - 3.8  Chloride 96 - 112 mEq/L - - 108  CO2 22 - 29 mEq/L 23 - 23  Calcium 8.4 - 10.4 mg/dL 9.8 - 9.3  Total Protein 6.0 - 8.5 g/dL 7.6 7.1 7.3  Total Bilirubin 0.20 - 1.20 mg/dL 1.29(H) - 0.7  Alkaline Phos 40 - 150 U/L 88 - 98  AST 5 - 34 U/L 17 - 11  ALT 0 - 55 U/L 21 - 13   CBC Latest Ref Rng & Units 06/10/2017 09/06/2013 09/05/2013  WBC 3.9 - 10.3 10e3/uL 4.6 11.5(H) 7.1  Hemoglobin 11.6 - 15.9 g/dL 12.1 11.0(L) 10.8(L)  Hematocrit 34.8 - 46.6 % 36.7 32.9(L) 33.3(L)  Platelets 145 - 400 10e3/uL 227 267 263    Lipid Panel No results for input(s): CHOL, TRIG, LDLCALC, VLDL, HDL, CHOLHDL, LDLDIRECT in the last 8760 hours. Lipid Panel     Component Value Date/Time   CHOL 149 03/06/2015 0826   TRIG 120 03/06/2015 0826   HDL 40 (  L) 03/06/2015 0826   CHOLHDL 3.7 03/06/2015 0826   VLDL 24 03/06/2015 0826    LDLCALC 85 03/06/2015 0826   LDLDIRECT 60 08/15/2010 2040     HEMOGLOBIN A1C Lab Results  Component Value Date   HGBA1C 5.5 04/16/2015   MPG 111 09/06/2013   TSH No results for input(s): TSH in the last 8760 hours.  External labs:   Cholesterol, total 156.000 m 02/12/2021 HDL 64.000 mg 02/12/2021 LDL 61.000 MG 02/09/2020 Triglycerides 77.000 mg 02/12/2021  A1C 5.200 % 02/09/2020 TSH 1.820 02/12/2021  Creatinine, Serum 0.820 MG/ 02/09/2020 Potassium 3.900 MM 01/23/2016 ALT (SGPT) 13.000 IU/ 02/09/2020  Radiology:   High-resolution CT scan of the chest 06/09/2018: Heart is in upper limits of normal in size, no other significant abnormality. No evidence of interstitial lung disease.  Air-trapping is indicated small airway disease.  Cardiac Studies:   Echocardiogram 02/16/2018: Left ventricle cavity is normal in size. Mild concentric hypertrophy of the left ventricle. Normal global wall motion. Doppler evidence of grade I (impaired) diastolic dysfunction, normal LAP. Calculated EF 76%. Mild tricuspid regurgitation.  No evidence of pulmonary hypertension.  EKG:   EKG 11/28/2021: Normal sinus rhythm at rate of 64 bpm, left atrial enlargement, otherwise normal EKG.    Assessment     ICD-10-CM   1. Supine hypertension  I10 PCV ECHOCARDIOGRAM COMPLETE    2. Orthostatic hypotension  I95.1 PCV ECHOCARDIOGRAM COMPLETE    3. Primary hypertension  I10 EKG 12-Lead       Medications Discontinued During This Encounter  Medication Reason   clobetasol cream (TEMOVATE) 0.05 %    MAGNESIUM PO     No orders of the defined types were placed in this encounter.  Orders Placed This Encounter  Procedures   EKG 12-Lead   PCV ECHOCARDIOGRAM COMPLETE    Standing Status:   Future    Standing Expiration Date:   11/29/2022   Recommendations:   Derya L Battaglia is a 71 y.o.  AA female  with  hypertension, former tobacco use, followed by [place] for orthostatic hypotension. She has diagnosis  of essential hypertension and also sickle cell trait, she is recently being evaluated for right hand weakness by Dr. Metta Clines, neurology on 11/26/2021.  She was evaluated by Dr. Royal Piedra with [facility] and referred to me for evaluation of chest pain, hypertension management.  Patient is now being followed by Dr. Tamsen Meek at [facility].  Upon discussion with the patient, she had 1 episode of chest pain about 2 months ago when the blood pressure was high that lasted for few minutes and she has not had any recurrence and she has continued to do all her physical activity without limitation.  Hence I do not think she needs any further work-up with regard to this in view of normal physical exam and normal EKG.  Hypertension although high blood pressure, she has severe orthostatic hypotension.  Hence advised her that we should only treat standing blood pressure and not sitting blood pressure.  Her blood pressure today is at goal.  I given him parameters for blood pressure medications.  She has had occasional episodes of severe dizziness.  Fortunately no syncope or fall.  Reviewed her external labs, I do not have recent labs although her previous labs had revealed normal renal function and lipid profile testing.  I am setting her up for an echocardiogram to evaluate orthostatic hypotension.  I will also send a note to Dr. Metta Clines, neurology to see whether  her symptoms of right hand weakness may be related to wild strain amyloid versus carpal tunnel neuropathy.    Adrian Prows, MD, [facility] 11/28/2021, 9:55 AM Office: [phone]

## 2021-11-28 NOTE — Patient Instructions (Signed)
Please check your blood pressure laying down and standing up at least twice a week and especially when you feel dizzy and maintain a diary. ? ?If your standing blood pressure is <100 mmHg, do not take your blood pressure medication.  Drink plenty of fluids and probably salty food.  Recheck few hours later and start with 1 medication at a time and always treat standing blood pressure and not sitting blood pressure, goal blood pressure is 120/80 mmHg standing-up to 130/80 mmHg standing. ?

## 2021-11-29 ENCOUNTER — Telehealth: Payer: Self-pay | Admitting: Neurology

## 2021-11-29 NOTE — Telephone Encounter (Signed)
See lab results. Patient has been informed of labs by [facility] on 11/29/2021. ?

## 2021-11-29 NOTE — Progress Notes (Signed)
Patient advised.

## 2021-11-29 NOTE — Telephone Encounter (Signed)
Patient is returning a call to someone about results ?

## 2021-12-10 ENCOUNTER — Ambulatory Visit: Payer: Medicare Other

## 2021-12-10 ENCOUNTER — Other Ambulatory Visit: Payer: Self-pay

## 2021-12-10 DIAGNOSIS — I10 Essential (primary) hypertension: Secondary | ICD-10-CM

## 2021-12-10 DIAGNOSIS — I951 Orthostatic hypotension: Secondary | ICD-10-CM

## 2021-12-26 ENCOUNTER — Ambulatory Visit (INDEPENDENT_AMBULATORY_CARE_PROVIDER_SITE_OTHER): Payer: Medicare Other | Admitting: Neurology

## 2021-12-26 ENCOUNTER — Telehealth: Payer: Self-pay

## 2021-12-26 DIAGNOSIS — M5412 Radiculopathy, cervical region: Secondary | ICD-10-CM

## 2021-12-26 DIAGNOSIS — M6281 Muscle weakness (generalized): Secondary | ICD-10-CM

## 2021-12-26 DIAGNOSIS — G5603 Carpal tunnel syndrome, bilateral upper limbs: Secondary | ICD-10-CM

## 2021-12-26 DIAGNOSIS — R202 Paresthesia of skin: Secondary | ICD-10-CM

## 2021-12-26 NOTE — Telephone Encounter (Signed)
Today patient is here for an EMG and as I was bringing patient back her daughter had some concerns she wanted to speak to Dr. Tomi Likens about.  ? ?Patients daughter stated that she has noticed her moms "hump" on the base of her neck/spine is more pronounced. She states patient is having a hard time standing up straight and has been affecting her posture.  ? ?Patietnts daughter wanted to know if maybe that could be causing her hand/arm issues? She would like to know if maybe PT would be a good option for her mom? She would like some advice on how to help her mom with this. ? ?I informed patients daughter that I will send a message to Dr. Tomi Likens and give her a call once I hear back.  ?

## 2021-12-26 NOTE — Procedures (Signed)
[facility]  ?[address], [address] ? [place], [place] [address] ?Tel: [phone] ?Fax:  [phone] ?Test Date:  12/26/2021 ? ?Patient: Sheila Fernandez DOB: 01/11/1951 Physician: Narda Amber, DO  ?Sex: Female Height: '5\' 1"'$  Ref Phys: Metta Clines, D.O.  ?ID#: [id number]   Technician:   ? ?Patient Complaints: ?This is a 71 year old female referred for evaluation of bilateral hand weakness. ? ?NCV & EMG Findings: ?Extensive electrophysiologic testing of the right upper extremity and additional studies of the left shows:  ?Bilateral median sensory responses show prolonged latency (R4.3, L4.0 ms).  Bilateral ulnar sensory responses are within normal limits. ?Bilateral median motor responses show prolonged latency (R4.5, L4.3 ms) and reduced amplitude (R1.4, L2.8 mV).  Bilateral ulnar motor responses show reduced amplitudes at the abductor digit he minimi and first dorsal interosseous muscle (R3.7, L4.2, R4.3, L5.7 mV).   ?Despite maximal activation, no motor unit recruitment is seen in the right abductor pollicis brevis muscle. 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  To a lesser degree, chronic motor axonal loss changes are seen affecting the left abductor pollicis brevis, bilateral biceps, and bilateral deltoid muscles. ? ?Impression: ?Active on chronic C8 radiculopathy affecting bilateral upper extremities, severe. ?Chronic C5 radiculopathy affecting bilateral upper extremities, mild-to-moderate. ?Bilateral median neuropathy at or distal to the wrist, consistent with a clinical diagnosis of carpal tunnel syndrome.  Overall, these findings are severe in degree electrically and worse on the right. ? ? ?___________________________ ?Narda Amber, DO ? ? ? ?Nerve Conduction Studies ?Anti Sensory Summary Table ? ? Stim Site NR Peak (ms) Norm Peak (ms) P-T Amp (?V) Norm P-T Amp  ?Left Median Anti Sensory (2nd Digit)  32?C   ?Wrist    4.0 <3.8 28.9 >10  ?Right Median Anti Sensory (2nd Digit)  32?C  ?Wrist    4.3 <3.8 21.0 >10  ?Left Ulnar Anti Sensory (5th Digit)  32?C  ?Wrist    2.7 <3.2 30.6 >5  ?Right Ulnar Anti Sensory (5th Digit)  32?C  ?Wrist    2.6 <3.2 31.2 >5  ? ?Motor Summary Table ? ? Stim Site NR Onset (ms) Norm Onset (ms) O-P Amp (mV) Norm O-P Amp Site1 Site2 Delta-0 (ms) Dist (cm) Vel (m/s) Norm Vel (m/s)  ?Left Median Motor (Abd Poll Brev)  32?C58  ?Wrist    4.3 <4.0 2.8 >5 Elbow Wrist 5.5 30.0 58 >50  ?Elbow    9.8  2.2         ?Right Median Motor (Abd Poll Brev)  32?C  ?Wrist    4.5 <4.0 1.4 >5 Elbow Wrist 5.0 31.0 62 >50  ?Elbow    9.5  1.0         ?Left Ulnar Motor (Abd Dig Minimi)  32?C  ?Wrist    2.8 <3.1 4.2 >7 B Elbow Wrist 4.4 22.0 50 >50  ?B Elbow    7.2  3.0  A Elbow B Elbow 1.9 10.0 53 >50  ?A Elbow    9.1  2.9         ?Right Ulnar Motor (Abd Dig Minimi)  32?C  ?Wrist    3.0 <3.1 3.7 >7 B Elbow Wrist 3.9 20.0 51 >50  ?B Elbow    6.9  2.9  A Elbow B Elbow 1.7 10.0 59 >50  ?A Elbow    8.6  2.7         ?Left Ulnar (FDI) Motor (1st DI)  32?C  ?Wrist    4.3 <4.5 5.7 >7 B Elbow Wrist 3.7 21.0 57 >50  ?B Elbow    8.0  5.4  A Elbow B Elbow 1.8 10.0 56 >50  ?A Elbow    9.8  5.3         ?Right Ulnar (FDI) Motor (1st DI)  32?C  ?Wrist    3.8 <4.5 4.3 >7 B Elbow Wrist 3.3 20.0 61 >50  ?B Elbow    7.1  3.0  A Elbow B Elbow 1.7 10.0 59 >50  ?A Elbow    8.8  2.3         ? ?EMG ? ? Side Muscle Ins Act Fibs Psw Fasc Number Recrt Dur Dur. Amp Amp. Poly Poly. Comment  ?Right 1stDorInt Nml 1+ Nml Nml [facility] Rapid All 1+ All 1+ All 1+ ATR  ?Right Abd Poll Brev Nml Nml Nml Nml NE None - - - - - - ATR  ?Right Ext Indicis Nml 1+ Nml Nml 3- Rapid Most 1+ Most 1+ Most 1+ N/A  ?Right PronatorTeres Nml Nml Nml Nml Nml Nml Nml Nml Nml Nml Nml Nml N/A  ?Right Biceps Nml Nml Nml Nml 2- Rapid Many 1+ Many 1+ Many 1+ N/A  ?Right Triceps Nml Nml Nml Nml 2- Rapid Many 1+ Many 1+ Many 1+ N/A  ?Right Deltoid Nml Nml Nml Nml 1- Rapid Some 1+ Some 1+  Some 1+ N/A  ?Right Cervical Parasp Low Nml 1+ Nml Nml NE - - - - - - - N/A  ?Left 1stDorInt Nml 1+ Nml Nml SMU Rapid All 1+ All 1+ All 1+ ATR  ?Left Abd Poll Brev Nml Nml Nml Nml 3- Rapid Most 1+ Most 2-.3- Most 1+ ATR  ?Left Ext Indicis Nml 1+ Nml Nml 3- Rapid Most 1+ Most 1+ Most 1+ N/A  ?Left PronatorTeres Nml Nml Nml Nml Nml Nml Nml Nml Nml Nml Nml Nml N/A  ?Left Biceps Nml Nml Nml Nml 1- Rapid Some 1+ Some 1+ Some 1+ N/A  ?Left Triceps Nml Nml Nml Nml 2- Rapid Many 1+ Many 1+ Many 1+ N/A  ?Left Deltoid Nml Nml Nml Nml 1- Rapid Some 1+ Some 1+ Some 1+ N/A  ? ? ? ? ?Waveforms: ?    ? ?    ? ?    ? ?  ? ? ?

## 2021-12-27 ENCOUNTER — Telehealth: Payer: Self-pay | Admitting: Neurology

## 2021-12-27 NOTE — Telephone Encounter (Signed)
Pt called back in returning a call about results 

## 2021-12-27 NOTE — Telephone Encounter (Signed)
Called patient and left a message for a call back.  

## 2021-12-30 NOTE — Addendum Note (Signed)
Addended by: Armen Pickup A on: 12/30/2021 03:24 PM ? ? Modules accepted: Orders ? ?

## 2021-12-30 NOTE — Telephone Encounter (Signed)
Patient called and was informed that The EMG shows evidence of pinched nerves in the neck as well as severe carpal tunnel syndrome in both hands which both are likely playing a role in her weakness.  Informed patient that Dr. Tomi Likens would like to refer her to neurosurgery (bilateral cervical radiculopathy and bilateral carpal tunnel syndrome). ? ?Patient is agreeable to have a referral sent to [facility]. Referral has been created and sent. Patient verbalized understanding of everything explained to her and had no additional questions or concerns.  ?

## 2021-12-30 NOTE — Telephone Encounter (Signed)
See previous note. Patient has been contacted about results and referral sent to [facility]. ?

## 2021-12-30 NOTE — Telephone Encounter (Signed)
Called patient and left a message for a call back.  

## 2021-12-30 NOTE — Addendum Note (Signed)
Addended by: Armen Pickup A on: 12/30/2021 03:20 PM ? ? Modules accepted: Orders ? ?

## 2022-01-09 ENCOUNTER — Encounter: Payer: Self-pay | Admitting: Cardiology

## 2022-01-09 ENCOUNTER — Ambulatory Visit: Payer: Medicare Other | Admitting: Cardiology

## 2022-01-09 VITALS — BP 132/85 | HR 73 | Temp 98.3°F | Resp 17 | Ht 61.0 in | Wt 120.6 lb

## 2022-01-09 DIAGNOSIS — R0609 Other forms of dyspnea: Secondary | ICD-10-CM

## 2022-01-09 DIAGNOSIS — I951 Orthostatic hypotension: Secondary | ICD-10-CM

## 2022-01-09 DIAGNOSIS — I10 Essential (primary) hypertension: Secondary | ICD-10-CM

## 2022-01-09 NOTE — Progress Notes (Signed)
? ?Primary Physician/Referring:  Margretta Sidle, MD ? ?Patient ID: Sheila Fernandez, female    DOB: Apr 18, 1951, 71 y.o.   MRN: [id number] ? ?Chief Complaint  ?Patient presents with  ? ORTHOSTATIC HYPOTENSION  ? ?HPI:   ? ?Sheila Fernandez  is a 71 y.o. AA female  with  hypertension, former tobacco use, followed by [place] for orthostatic hypotension. She has diagnosis of essential hypertension and also sickle cell trait, she is recently being evaluated for right hand weakness by Dr. Metta Clines, neurology on 11/26/2021. ? ?I had seen her about 5 to 6 weeks ago for management of dyspnea, hypertension, chest pain.  She had an episode of chest pain about 3 months ago with no recurrence. ? ?She now presents for follow-up, continues to have neurologic issues with upper extremities and has been evaluated by neurology and found to have both symptoms suggestive of carpal tunnel syndrome and also cervical spine disease.  She is awaiting further work-up and MRI. ? ?Dyspnea on exertion has been ongoing for the past 3 to 4 months and is slowly getting worse.  She also has lack of appetite, weight loss.  Denies chest pain.   No PND or orthopnea or leg edema. ? ?Past Medical History:  ?Diagnosis Date  ? Adenomyosis   ? Anemia   ? Arthritis   ? Hypertension   ? Iritis, recurrent   ? Orthostatic hypotension 01/21/2019  ? Sickle cell trait ([place])   ? Uterine fibroid   ? ?Past Surgical History:  ?Procedure Laterality Date  ? BREAST BIOPSY Left 1986  ? benign excisional no scar seen   ? BREAST LUMPECTOMY Left   ? Pt does not recall having lumpectomy  ? CESAREAN SECTION    ? CHOLECYSTECTOMY    ? FOOT SURGERY    ? cyst removed  ? ?Family History  ?Problem Relation Age of Onset  ? Cancer Mother 69  ?     Pancreatic  ? Hypertension Mother   ? Stroke Father   ? Cancer Father 50  ?     Prostate  ? Hypertension Father   ? Diabetes Sister   ? Diabetes Brother   ? Hypertension Brother   ? Kidney disease Brother   ? Lupus Daughter   ? Diabetes  Maternal Grandmother   ? HIV Sister   ? Cancer Sister   ?     Cancer r/t HIV  ? Cancer Brother   ?     Prostate  ? Colon cancer Neg Hx   ? Esophageal cancer Neg Hx   ? Stomach cancer Neg Hx   ? Colon polyps Neg Hx   ?  ?Social History  ? ?Tobacco Use  ? Smoking status: Former  ?  Packs/day: 1.00  ?  Years: 15.00  ?  Pack years: 15.00  ?  Types: Cigarettes  ?  Quit date: 11/28/2003  ?  Years since quitting: 18.1  ? Smokeless tobacco: Never  ?Substance Use Topics  ? Alcohol use: No  ? ?Marital Status: Divorced  ?ROS  ?Review of Systems  ?Cardiovascular:  Positive for dyspnea on exertion. Negative for chest pain and leg swelling.  ?Neurological:  Positive for dizziness.  ?Objective  ?Blood pressure 132/85, pulse 73, temperature 98.3 ?F (36.8 ?C), temperature source Temporal, resp. rate 17, height '5\' 1"'$  (1.549 m), weight 120 lb 9.6 oz (54.7 kg), SpO2 98 %. Body mass index is 22.79 kg/m?.  ? ?  01/09/2022  ?  9:35 AM 11/28/2021  ?  9:10 AM 11/26/2021  ?  8:34 AM  ?Vitals with BMI  ?Height '5\' 1"'$  '5\' 1"'$    ?Weight 120 lbs 10 oz 131 lbs 10 oz   ?BMI 22.8 24.88   ?Systolic 161 096 045  ?Diastolic 85 83 96  ?Pulse 73 64   ?  ?Orthostatic VS for the past 72 hrs (Last 3 readings): ? Orthostatic BP Patient Position BP Location Cuff Size Orthostatic Pulse  ?01/09/22 0942 116/72 Standing Left Arm Normal 69  ?01/09/22 0941 124/84 Sitting Left Arm Normal 74  ?01/09/22 0940 147/86 Supine Left Arm Normal 71  ? ? ?Physical Exam ?Neck:  ?   Vascular: No JVD.  ?Cardiovascular:  ?   Rate and Rhythm: Normal rate and regular rhythm.  ?   Pulses: Intact distal pulses.  ?   Heart sounds: Normal heart sounds. No murmur heard. ?  No gallop.  ?Pulmonary:  ?   Effort: Pulmonary effort is normal.  ?   Breath sounds: Normal breath sounds.  ?Abdominal:  ?   General: Bowel sounds are normal.  ?   Palpations: Abdomen is soft.  ?Musculoskeletal:  ?   Right lower leg: No edema.  ?   Left lower leg: No edema.  ?  ? ?Medications and allergies  ? ?Allergies   ?Allergen Reactions  ? Codeine Nausea And Vomiting  ?  ? ?Medication list after today's encounter  ? ?Current Outpatient Medications:  ?  amLODipine (NORVASC) 2.5 MG tablet, Take 2.5 mg by mouth daily., Disp: , Rfl:  ?  carvedilol (COREG) 25 MG tablet, Take 25 mg by mouth 2 (two) times daily with a meal., Disp: , Rfl:  ?  Cholecalciferol (VITAMIN D PO), Take 1 tablet by mouth daily., Disp: , Rfl:  ?  co-enzyme Q-10 30 MG capsule, Take 30 mg by mouth 3 (three) times daily., Disp: , Rfl:  ?  levocetirizine (XYZAL) 5 MG tablet, Take 5 mg by mouth daily., Disp: , Rfl:  ?  losartan (COZAAR) 100 MG tablet, Take 1 tablet (100 mg total) by mouth daily. Pt needs to schedule a f/u appt for further refills., Disp: 30 tablet, Rfl: 1 ?  Misc Natural Products (DANDELION ROOT PO), Take by mouth., Disp: , Rfl:  ?  prednisoLONE acetate (OMNIPRED) 1 % ophthalmic suspension, Place 2 drops into the left eye 4 (four) times daily., Disp: 5 mL, Rfl: 1 ? ?Laboratory examination:  ? ?No results for input(s): NA, K, CL, CO2, GLUCOSE, BUN, CREATININE, CALCIUM, GFRNONAA, GFRAA in the last 8760 hours. ?CrCl cannot be calculated (Patient's most recent lab result is older than the maximum 21 days allowed.).  ? ?  Latest Ref Rng & Units 06/10/2017  ? 12:38 PM 03/06/2015  ?  8:26 AM 09/06/2013  ?  2:25 AM  ?CMP  ?Glucose 70 - 140 mg/dl 93   108   142    ?BUN 7.0 - 26.0 mg/dL 18.'5   16   15    '$ ?Creatinine 0.6 - 1.1 mg/dL 0.8   0.71   0.65    ?Sodium 136 - 145 mEq/L 138   139   138    ?Potassium 3.5 - 5.1 mEq/L 4.0   3.8   3.9    ?Chloride 96 - 112 mEq/L  108   103    ?CO2 22 - 29 mEq/L '23   23   24    '$ ?Calcium 8.4 - 10.4 mg/dL 9.8   9.3   8.8    ?Total Protein  6.4 - 8.3 g/dL 7.6    ? 7.1   7.3     ?Total Bilirubin 0.20 - 1.20 mg/dL 1.29   0.7     ?Alkaline Phos 40 - 150 U/L 88   98     ?AST 5 - 34 U/L 17   11     ?ALT 0 - 55 U/L 21   13     ? ? ?  Latest Ref Rng & Units 06/10/2017  ? 12:38 PM 09/06/2013  ?  2:25 AM 09/05/2013  ?  6:59 PM  ?CBC  ?WBC 3.9 -  10.3 10e3/uL 4.6   11.5   7.1    ?Hemoglobin 11.6 - 15.9 g/dL 12.1   11.0   10.8    ?Hematocrit 34.8 - 46.6 % 36.7   32.9   33.3    ?Platelets 145 - 400 10e3/uL 227   267   263    ? ? ?Lipid Panel ?No results for input(s): CHOL, TRIG, LDLCALC, VLDL, HDL, CHOLHDL, LDLDIRECT in the last 8760 hours. ?Lipid Panel  ?   ?Component Value Date/Time  ? CHOL 149 03/06/2015 0826  ? TRIG 120 03/06/2015 0826  ? HDL 40 (L) 03/06/2015 0826  ? CHOLHDL 3.7 03/06/2015 0826  ? VLDL 24 03/06/2015 0826  ? Burden 85 03/06/2015 0826  ? LDLDIRECT 60 08/15/2010 2040  ?  ? ?HEMOGLOBIN A1C ?Lab Results  ?Component Value Date  ? HGBA1C 5.5 04/16/2015  ? MPG 111 09/06/2013  ? ?TSH ?No results for input(s): TSH in the last 8760 hours. ? ?External labs:  ? ?Cholesterol, total 156.000 m 02/12/2021 ?HDL 64.000 mg 02/12/2021 ?LDL 61.000 MG 02/09/2020 ?Triglycerides 77.000 mg 02/12/2021 ? ?A1C 5.200 % 02/09/2020 ?TSH 1.820 02/12/2021 ? ?Creatinine, Serum 0.820 MG/ 02/09/2020 ?Potassium 3.900 MM 01/23/2016 ?ALT (SGPT) 13.000 IU/ 02/09/2020 ? ?Radiology:  ? ?High-resolution CT scan of the chest 06/09/2018: ?Heart is in upper limits of normal in size, no other significant abnormality. ?No evidence of interstitial lung disease.  Air-trapping is indicated small airway disease. ? ?Cardiac Studies:  ? ?PCV ECHOCARDIOGRAM COMPLETE 12/10/2021  ?Echocardiogram 12/10/2021: ?Left ventricle cavity is normal in size and wall thickness. Normal global wall motion. Normal LV systolic function with EF 61%. Doppler evidence of grade I (impaired) diastolic dysfunction, normal LAP. Mild mitral valve stenosis. ?Mild tricuspid regurgitation. ?IVC not seen. ?No significant change compared to previous study in 2019. ?   ? ?EKG:  ? ?EKG 11/28/2021: Normal sinus rhythm at rate of 64 bpm, left atrial enlargement, otherwise normal EKG.   ? ?Assessment  ? ?  ICD-10-CM   ?1. Orthostatic hypotension  I95.1   ?  ?2. Supine hypertension  I10   ?  ?3. Dyspnea on exertion  R06.09 PCV MYOCARDIAL  PERFUSION WITH LEXISCAN  ?  ?  ? ?There are no discontinued medications. ?  ?No orders of the defined types were placed in this encounter. ? ?Orders Placed This Encounter  ?Procedures  ? PCV MYOCARDIAL PERFUSI

## 2022-01-10 ENCOUNTER — Ambulatory Visit
Admission: RE | Admit: 2022-01-10 | Discharge: 2022-01-10 | Disposition: A | Discharge status: Home / Self Care | Payer: Medicare Other | Source: Ambulatory Visit | Attending: Family Medicine | Admitting: Family Medicine

## 2022-01-10 ENCOUNTER — Other Ambulatory Visit: Payer: Self-pay | Admitting: Family Medicine

## 2022-01-10 DIAGNOSIS — Z1231 Encounter for screening mammogram for malignant neoplasm of breast: Secondary | ICD-10-CM

## 2022-01-10 DIAGNOSIS — R0602 Shortness of breath: Secondary | ICD-10-CM

## 2022-01-13 ENCOUNTER — Other Ambulatory Visit: Payer: Self-pay | Admitting: Family Medicine

## 2022-01-13 DIAGNOSIS — J9811 Atelectasis: Secondary | ICD-10-CM

## 2022-01-13 DIAGNOSIS — R634 Abnormal weight loss: Secondary | ICD-10-CM

## 2022-01-14 ENCOUNTER — Ambulatory Visit
Admission: RE | Admit: 2022-01-14 | Discharge: 2022-01-14 | Disposition: A | Discharge status: Home / Self Care | Payer: Medicare Other | Source: Ambulatory Visit | Attending: Family Medicine | Admitting: Family Medicine

## 2022-01-14 DIAGNOSIS — R634 Abnormal weight loss: Secondary | ICD-10-CM

## 2022-01-14 DIAGNOSIS — J9811 Atelectasis: Secondary | ICD-10-CM

## 2022-01-14 MED ORDER — IOPAMIDOL (ISOVUE-300) INJECTION 61%
75.0000 mL | Freq: Once | INTRAVENOUS | Status: AC | PRN
Start: 2022-01-14 — End: 2022-01-14
  Administered 2022-01-14: 75 mL via INTRAVENOUS

## 2022-01-22 ENCOUNTER — Other Ambulatory Visit: Payer: Medicare Other

## 2022-01-28 ENCOUNTER — Ambulatory Visit
Admission: RE | Admit: 2022-01-28 | Discharge: 2022-01-28 | Disposition: A | Discharge status: Home / Self Care | Payer: Medicare Other | Source: Ambulatory Visit | Attending: Family Medicine | Admitting: Family Medicine

## 2022-01-28 DIAGNOSIS — Z1231 Encounter for screening mammogram for malignant neoplasm of breast: Secondary | ICD-10-CM

## 2022-01-29 ENCOUNTER — Other Ambulatory Visit: Payer: Self-pay | Admitting: Family Medicine

## 2022-01-29 ENCOUNTER — Ambulatory Visit: Payer: Medicare Other

## 2022-01-29 ENCOUNTER — Encounter: Payer: Self-pay | Admitting: Cardiology

## 2022-01-29 DIAGNOSIS — R0609 Other forms of dyspnea: Secondary | ICD-10-CM

## 2022-01-29 DIAGNOSIS — R928 Other abnormal and inconclusive findings on diagnostic imaging of breast: Secondary | ICD-10-CM

## 2022-02-02 ENCOUNTER — Encounter: Payer: Self-pay | Admitting: Cardiology

## 2022-02-02 NOTE — Progress Notes (Signed)
Normal stress test

## 2022-02-03 NOTE — Progress Notes (Signed)
Called and spoke with patient regarding her stress test results.

## 2022-02-10 ENCOUNTER — Ambulatory Visit
Admission: RE | Admit: 2022-02-10 | Discharge: 2022-02-10 | Disposition: A | Discharge status: Home / Self Care | Payer: Medicare Other | Source: Ambulatory Visit | Attending: Family Medicine | Admitting: Family Medicine

## 2022-02-10 ENCOUNTER — Other Ambulatory Visit: Payer: Self-pay | Admitting: Family Medicine

## 2022-02-10 DIAGNOSIS — R928 Other abnormal and inconclusive findings on diagnostic imaging of breast: Secondary | ICD-10-CM

## 2022-02-10 DIAGNOSIS — N6320 Unspecified lump in the left breast, unspecified quadrant: Secondary | ICD-10-CM

## 2022-02-14 ENCOUNTER — Ambulatory Visit
Admission: RE | Admit: 2022-02-14 | Discharge: 2022-02-14 | Disposition: A | Discharge status: Home / Self Care | Payer: Medicare Other | Source: Ambulatory Visit | Attending: Family Medicine | Admitting: Family Medicine

## 2022-02-14 DIAGNOSIS — N6320 Unspecified lump in the left breast, unspecified quadrant: Secondary | ICD-10-CM

## 2022-03-04 ENCOUNTER — Encounter: Payer: Self-pay | Admitting: Cardiology

## 2022-03-04 NOTE — Telephone Encounter (Signed)
From patient.

## 2022-03-04 NOTE — Telephone Encounter (Signed)
Please set up OV 

## 2022-03-06 ENCOUNTER — Encounter: Payer: Self-pay | Admitting: Cardiology

## 2022-03-06 ENCOUNTER — Ambulatory Visit: Payer: Medicare Other | Admitting: Cardiology

## 2022-03-06 VITALS — BP 128/92 | HR 77 | Temp 97.9°F | Resp 17 | Ht 61.0 in | Wt 113.8 lb

## 2022-03-06 DIAGNOSIS — R0609 Other forms of dyspnea: Secondary | ICD-10-CM

## 2022-03-06 NOTE — Progress Notes (Signed)
Primary Physician/Referring:  Margretta Sidle, MD  Patient ID: Sheila Fernandez, female    DOB: 1950/11/10, 71 y.o.   MRN: [id number]  Chief Complaint  Patient presents with  . Orthostatic hypotension   HPI:    Sheila Fernandez  is a 71 y.o. AA female  with  hypertension, former tobacco use, followed by [place] for orthostatic hypotension. She has diagnosis of essential hypertension and also sickle cell trait, she is recently being evaluated for right hand weakness by Dr. Metta Clines, neurology on 11/26/2021.  I had seen her about 5 to 6 weeks ago for management of dyspnea, hypertension, chest pain.  She had an episode of chest pain about 3 months ago with no recurrence.  She now presents for follow-up, continues to have neurologic issues with upper extremities and has been evaluated by neurology and found to have both symptoms suggestive of carpal tunnel syndrome and also cervical spine disease.  She is awaiting further work-up and MRI.  Dyspnea on exertion has been ongoing for the past 3 to 4 months and is slowly getting worse.  She also has lack of appetite, weight loss.  Denies chest pain.   No PND or orthopnea or leg edema.  Past Medical History:  Diagnosis Date  . Adenomyosis   . Anemia   . Arthritis   . Hypertension   . Iritis, recurrent   . Orthostatic hypotension 01/21/2019  . Sickle cell trait ([place])   . Uterine fibroid    Past Surgical History:  Procedure Laterality Date  . BREAST BIOPSY Left 1986   benign excisional no scar seen   . BREAST LUMPECTOMY Left    Pt does not recall having lumpectomy  . CESAREAN SECTION    . CHOLECYSTECTOMY    . FOOT SURGERY     cyst removed   Family History  Problem Relation Age of Onset  . Cancer Mother 46       Pancreatic  . Hypertension Mother   . Stroke Father   . Cancer Father 34       Prostate  . Hypertension Father   . Breast cancer Sister   . Diabetes Sister   . HIV Sister   . Cancer Sister        Cancer r/t HIV  .  Lupus Daughter   . Diabetes Maternal Grandmother   . Diabetes Brother   . Hypertension Brother   . Kidney disease Brother   . Cancer Brother        Prostate  . Colon cancer Neg Hx   . Esophageal cancer Neg Hx   . Stomach cancer Neg Hx   . Colon polyps Neg Hx     Social History   Tobacco Use  . Smoking status: Former    Packs/day: 1.00    Years: 15.00    Total pack years: 15.00    Types: Cigarettes    Quit date: 11/28/2003    Years since quitting: 18.2  . Smokeless tobacco: Never  Substance Use Topics  . Alcohol use: No   Marital Status: Divorced  ROS  Review of Systems  Cardiovascular:  Positive for dyspnea on exertion. Negative for chest pain and leg swelling.  Neurological:  Positive for dizziness.  Objective  Blood pressure (!) 128/92, pulse 77, temperature 97.9 F (36.6 C), temperature source Temporal, resp. rate 17, height _0  (1.549 m), weight 113 lb 12.8 oz (51.6 kg), SpO2 100 %. Body mass index is 21.5 kg/m.  03/06/2022    1:59 PM 03/06/2022    1:52 PM 01/09/2022    9:35 AM  Vitals with BMI  Height  _0  _1   Weight  113 lbs 13 oz 120 lbs 10 oz  BMI  79.48 01.6  Systolic 553 748 270  Diastolic 92 93 85  Pulse 77 73 73    Orthostatic VS for the past 72 hrs (Last 3 readings):  Patient Position BP Location Cuff Size  03/06/22 1359 Sitting Left Arm Normal  03/06/22 1352 Sitting Left Arm Normal     Physical Exam Neck:     Vascular: No JVD.  Cardiovascular:     Rate and Rhythm: Normal rate and regular rhythm.     Pulses: Intact distal pulses.     Heart sounds: Normal heart sounds. No murmur heard.    No gallop.  Pulmonary:     Effort: Pulmonary effort is normal.     Breath sounds: Normal breath sounds.  Abdominal:     General: Bowel sounds are normal.     Palpations: Abdomen is soft.  Musculoskeletal:     Right lower leg: No edema.     Left lower leg: No edema.     Medications and allergies   Allergies  Allergen Reactions  . Codeine  Nausea And Vomiting     Medication list after today's encounter   Current Outpatient Medications:  .  amLODipine (NORVASC) 2.5 MG tablet, Take 2.5 mg by mouth daily., Disp: , Rfl:  .  carvedilol (COREG) 25 MG tablet, Take 25 mg by mouth 2 (two) times daily with a meal., Disp: , Rfl:  .  Cholecalciferol (VITAMIN D PO), Take 1 tablet by mouth daily., Disp: , Rfl:  .  co-enzyme Q-10 30 MG capsule, Take 30 mg by mouth 3 (three) times daily., Disp: , Rfl:  .  levocetirizine (XYZAL) 5 MG tablet, Take 5 mg by mouth daily., Disp: , Rfl:  .  losartan (COZAAR) 100 MG tablet, Take 1 tablet (100 mg total) by mouth daily. Pt needs to schedule a f/u appt for further refills., Disp: 30 tablet, Rfl: 1 .  mirtazapine (REMERON) 7.5 MG tablet, Take 7.5 mg by mouth at bedtime., Disp: , Rfl:  .  Misc Natural Products (DANDELION ROOT PO), Take by mouth., Disp: , Rfl:  .  pantoprazole (PROTONIX) 40 MG tablet, Take 40 mg by mouth daily., Disp: , Rfl:  .  prednisoLONE acetate (OMNIPRED) 1 % ophthalmic suspension, Place 2 drops into the left eye 4 (four) times daily., Disp: 5 mL, Rfl: 1  Laboratory examination:   No results for input(s): "NA", "K", "CL", "CO2", "GLUCOSE", "BUN", "CREATININE", "CALCIUM", "GFRNONAA", "GFRAA" in the last 8760 hours. CrCl cannot be calculated (Patient's most recent lab result is older than the maximum 21 days allowed.).     Latest Ref Rng & Units 06/10/2017   12:38 PM 03/06/2015    8:26 AM 09/06/2013    2:25 AM  CMP  Glucose 70 - 140 mg/dl 93  108  142   BUN 7.0 - 26.0 mg/dL 18._2 Creatinine 0.6 - 1.1 mg/dL 0.8  0.71  0.65   Sodium 136 - 145 mEq/L 138  139  138   Potassium 3.5 - 5.1 mEq/L 4.0  3.8  3.9   Chloride 96 - 112 mEq/L  108  103   CO2 22 - 29 mEq/L _3 Calcium 8.4 - 10.4 mg/dL 9.8  9.3  8.8   Total Protein 6.4 - 8.3 g/dL 6.0 - 8.5 g/dL 7.6    7.1  7.3    Total Bilirubin 0.20 - 1.20 mg/dL 1.29  0.7    Alkaline Phos 40 - 150 U/L 88  98    AST 5 - 34  U/L 17  11    ALT 0 - 55 U/L 21  13        Latest Ref Rng & Units 06/10/2017   12:38 PM 09/06/2013    2:25 AM 09/05/2013    6:59 PM  CBC  WBC 3.9 - 10.3 10e3/uL 4.6  11.5  7.1   Hemoglobin 11.6 - 15.9 g/dL 12.1  11.0  10.8   Hematocrit 34.8 - 46.6 % 36.7  32.9  33.3   Platelets 145 - 400 10e3/uL 227  267  263     Lipid Panel No results for input(s): "CHOL", "TRIG", "[place]", "VLDL", "HDL", "CHOLHDL", "LDLDIRECT" in the last 8760 hours. Lipid Panel     Component Value Date/Time   CHOL 149 03/06/2015 0826   TRIG 120 03/06/2015 0826   HDL 40 (L) 03/06/2015 0826   CHOLHDL 3.7 03/06/2015 0826   VLDL 24 03/06/2015 0826   LDLCALC 85 03/06/2015 0826   LDLDIRECT 60 08/15/2010 2040     HEMOGLOBIN A1C Lab Results  Component Value Date   HGBA1C 5.5 04/16/2015   MPG 111 09/06/2013   TSH No results for input(s): "TSH" in the last 8760 hours.  External labs:   Cholesterol, total 156.000 m 02/12/2021 HDL 64.000 mg 02/12/2021 LDL 61.000 MG 02/09/2020 Triglycerides 77.000 mg 02/12/2021  A1C 5.200 % 02/09/2020 TSH 1.820 02/12/2021  Creatinine, Serum 0.820 MG/ 02/09/2020 Potassium 3.900 MM 01/23/2016 ALT (SGPT) 13.000 IU/ 02/09/2020  Radiology:   High-resolution CT scan of the chest 06/09/2018: Heart is in upper limits of normal in size, no other significant abnormality. No evidence of interstitial lung disease.  Air-trapping is indicated small airway disease.  Cardiac Studies:   PCV ECHOCARDIOGRAM COMPLETE 12/10/2021  Echocardiogram 12/10/2021: Left ventricle cavity is normal in size and wall thickness. Normal global wall motion. Normal LV systolic function with EF 61%. Doppler evidence of grade I (impaired) diastolic dysfunction, normal LAP. Mild mitral valve stenosis. Mild tricuspid regurgitation. IVC not seen. No significant change compared to previous study in 2019.     EKG:   EKG 11/28/2021: Normal sinus rhythm at rate of 64 bpm, left atrial enlargement, otherwise normal  EKG.    Assessment   No diagnosis found.    There are no discontinued medications.   No orders of the defined types were placed in this encounter.  No orders of the defined types were placed in this encounter.  Recommendations:   Niharika L Edgecombe is a 71 y.o.  AA female  with  hypertension, former tobacco use, followed by [place] for orthostatic hypotension. She has diagnosis of essential hypertension and also sickle cell trait, she is recently being evaluated for right hand weakness by Dr. Metta Clines, neurology on 11/26/2021.    She is referred back to me for evaluation of 1 episode of chest pain that occurred about 3 months ago with no recurrence, hypertension management and also note patient endorses dyspnea on exertion.  Patient is accompanied by her daughter.  Patient has systemic disease.  She has got loss of appetite, tingling numbness in her hands and feet, mostly upper extremity weakness, neurology has evaluated her and found to have both carpal tunnel syndrome and also  probably cervical spine disease.  There was question about multiple myeloma in the past about 4 to 5 years ago and she was ruled out for this.  She has orthostatic hypotension and supine hypertension, all this again pointed out to multiple myeloma high on the list.  If multiple myeloma is ruled out, cervical spine disease could also be possible etiology for her presentation.  With regard to dyspnea on exertion, I have set her up for a Lexiscan nuclear stress test to exclude coronary ischemia although my suspicion is only at most intermediate.  However to completely exclude cardiac etiology will do this.  Echocardiogram was normal with no change compared to 2019 echocardiogram.  No clinical evidence of heart failure.  Blood pressure is well controlled.  As recommended on my office visit previously, she is now being very careful with getting up from the bed and has not had any further significant dizziness except 1 episode  in the past 6 weeks.  Again advised her that not to take blood pressure medications if standing blood pressure systolic is <473 mmHg.  Otherwise stable from cardiac standpoint, unless nuclear stress test is abnormal, I will see her back on a as needed basis.  I explained all the etiology, presentation, possible further need for neurologic evaluation with the patient's daughter as well.    Adrian Prows, MD, [facility] 03/06/2022, 2:36 PM Office: [phone]         CC: Metta Clines, DO

## 2022-03-06 NOTE — Progress Notes (Signed)
Primary Physician/Referring:  Margretta Sidle, MD  Patient ID: Sheila Fernandez, female    DOB: 22-Jun-1951, 71 y.o.   MRN: [id number]  Chief Complaint  Patient presents with   Orthostatic hypotension   HPI:    Sheila Fernandez  is a 71 y.o. AA female  with  hypertension, former tobacco use, followed by [place] for orthostatic hypotension. She has diagnosis of essential hypertension and also sickle cell trait, she is recently being evaluated for right hand weakness by Dr. Metta Clines, neurology on 11/26/2021.  Patient presents for urgent visit at family's request to review daughter's concern of amyloid cardiomyopathy.  Patient's symptoms are unchanged compared to previous office visit.  Reviewed and discussed results of recent stress test which was overall low risk.  Family requests evaluation for amyloid cardiomyopathy.   Past Medical History:  Diagnosis Date   Adenomyosis    Anemia    Arthritis    Hypertension    Iritis, recurrent    Orthostatic hypotension 01/21/2019   Sickle cell trait ([place])    Uterine fibroid    Past Surgical History:  Procedure Laterality Date   BREAST BIOPSY Left 1986   benign excisional no scar seen    BREAST LUMPECTOMY Left    Pt does not recall having lumpectomy   CESAREAN SECTION     CHOLECYSTECTOMY     FOOT SURGERY     cyst removed   Family History  Problem Relation Age of Onset   Cancer Mother 17       Pancreatic   Hypertension Mother    Stroke Father    Cancer Father 35       Prostate   Hypertension Father    Breast cancer Sister    Diabetes Sister    HIV Sister    Cancer Sister        Cancer r/t HIV   Lupus Daughter    Diabetes Maternal Grandmother    Diabetes Brother    Hypertension Brother    Kidney disease Brother    Cancer Brother        Prostate   Colon cancer Neg Hx    Esophageal cancer Neg Hx    Stomach cancer Neg Hx    Colon polyps Neg Hx     Social History   Tobacco Use   Smoking status: Former    Packs/day:  1.00    Years: 15.00    Total pack years: 15.00    Types: Cigarettes    Quit date: 11/28/2003    Years since quitting: 18.2   Smokeless tobacco: Never  Substance Use Topics   Alcohol use: No   Marital Status: Divorced  ROS  Review of Systems  Cardiovascular:  Positive for dyspnea on exertion. Negative for chest pain and leg swelling.  Neurological:  Positive for dizziness.   Objective  Blood pressure (!) 128/92, pulse 77, temperature 97.9 F (36.6 C), temperature source Temporal, resp. rate 17, height '5\' 1"'$  (1.549 m), weight 113 lb 12.8 oz (51.6 kg), SpO2 100 %. Body mass index is 21.5 kg/m.     03/06/2022    1:59 PM 03/06/2022    1:52 PM 01/09/2022    9:35 AM  Vitals with BMI  Height  '5\' 1"'$  '5\' 1"'$   Weight  113 lbs 13 oz 120 lbs 10 oz  BMI  40.98 11.9  Systolic 147 829 562  Diastolic 92 93 85  Pulse 77 73 73    Orthostatic VS for the past 72  hrs (Last 3 readings):  Patient Position BP Location Cuff Size  03/06/22 1359 Sitting Left Arm Normal  03/06/22 1352 Sitting Left Arm Normal    Physical Exam Neck:     Vascular: No JVD.  Cardiovascular:     Rate and Rhythm: Normal rate and regular rhythm.     Pulses: Intact distal pulses.     Heart sounds: Normal heart sounds. No murmur heard.    No gallop.  Pulmonary:     Effort: Pulmonary effort is normal.     Breath sounds: Normal breath sounds.  Abdominal:     General: Bowel sounds are normal.     Palpations: Abdomen is soft.  Musculoskeletal:     Right lower leg: No edema.     Left lower leg: No edema.    Physical exam unchanged compared to previous office visit.   Medications and allergies   Allergies  Allergen Reactions   Codeine Nausea And Vomiting     Medication list after today's encounter   Current Outpatient Medications:    amLODipine (NORVASC) 2.5 MG tablet, Take 2.5 mg by mouth daily., Disp: , Rfl:    carvedilol (COREG) 25 MG tablet, Take 25 mg by mouth 2 (two) times daily with a meal., Disp: , Rfl:     Cholecalciferol (VITAMIN D PO), Take 1 tablet by mouth daily., Disp: , Rfl:    co-enzyme Q-10 30 MG capsule, Take 30 mg by mouth 3 (three) times daily., Disp: , Rfl:    levocetirizine (XYZAL) 5 MG tablet, Take 5 mg by mouth daily., Disp: , Rfl:    losartan (COZAAR) 100 MG tablet, Take 1 tablet (100 mg total) by mouth daily. Pt needs to schedule a f/u appt for further refills., Disp: 30 tablet, Rfl: 1   mirtazapine (REMERON) 7.5 MG tablet, Take 7.5 mg by mouth at bedtime., Disp: , Rfl:    Misc Natural Products (DANDELION ROOT PO), Take by mouth., Disp: , Rfl:    pantoprazole (PROTONIX) 40 MG tablet, Take 40 mg by mouth daily., Disp: , Rfl:    prednisoLONE acetate (OMNIPRED) 1 % ophthalmic suspension, Place 2 drops into the left eye 4 (four) times daily., Disp: 5 mL, Rfl: 1  Laboratory examination:   No results for input(s): "NA", "K", "CL", "CO2", "GLUCOSE", "BUN", "CREATININE", "CALCIUM", "GFRNONAA", "GFRAA" in the last 8760 hours. CrCl cannot be calculated (Patient's most recent lab result is older than the maximum 21 days allowed.).     Latest Ref Rng & Units 06/10/2017   12:38 PM 03/06/2015    8:26 AM 09/06/2013    2:25 AM  CMP  Glucose 70 - 140 mg/dl 93  108  142   BUN 7.0 - 26.0 mg/dL 18.'5  16  15   '$ Creatinine 0.6 - 1.1 mg/dL 0.8  0.71  0.65   Sodium 136 - 145 mEq/L 138  139  138   Potassium 3.5 - 5.1 mEq/L 4.0  3.8  3.9   Chloride 96 - 112 mEq/L  108  103   CO2 22 - 29 mEq/L '23  23  24   '$ Calcium 8.4 - 10.4 mg/dL 9.8  9.3  8.8   Total Protein 6.4 - 8.3 g/dL 6.0 - 8.5 g/dL 7.6    7.1  7.3    Total Bilirubin 0.20 - 1.20 mg/dL 1.29  0.7    Alkaline Phos 40 - 150 U/L 88  98    AST 5 - 34 U/L 17  11    ALT  0 - 55 U/L 21  13        Latest Ref Rng & Units 06/10/2017   12:38 PM 09/06/2013    2:25 AM 09/05/2013    6:59 PM  CBC  WBC 3.9 - 10.3 10e3/uL 4.6  11.5  7.1   Hemoglobin 11.6 - 15.9 g/dL 12.1  11.0  10.8   Hematocrit 34.8 - 46.6 % 36.7  32.9  33.3   Platelets 145 - 400 10e3/uL  227  267  263     Lipid Panel No results for input(s): "CHOL", "TRIG", "[place]", "VLDL", "HDL", "CHOLHDL", "LDLDIRECT" in the last 8760 hours. Lipid Panel     Component Value Date/Time   CHOL 149 03/06/2015 0826   TRIG 120 03/06/2015 0826   HDL 40 (L) 03/06/2015 0826   CHOLHDL 3.7 03/06/2015 0826   VLDL 24 03/06/2015 0826   LDLCALC 85 03/06/2015 0826   LDLDIRECT 60 08/15/2010 2040     HEMOGLOBIN A1C Lab Results  Component Value Date   HGBA1C 5.5 04/16/2015   MPG 111 09/06/2013   TSH No results for input(s): "TSH" in the last 8760 hours.  External labs:   Cholesterol, total 156.000 m 02/12/2021 HDL 64.000 mg 02/12/2021 LDL 61.000 MG 02/09/2020 Triglycerides 77.000 mg 02/12/2021  A1C 5.200 % 02/09/2020 TSH 1.820 02/12/2021  Creatinine, Serum 0.820 MG/ 02/09/2020 Potassium 3.900 MM 01/23/2016 ALT (SGPT) 13.000 IU/ 02/09/2020  Radiology:   High-resolution CT scan of the chest 06/09/2018: Heart is in upper limits of normal in size, no other significant abnormality. No evidence of interstitial lung disease.  Air-trapping is indicated small airway disease.  Cardiac Studies:   PCV ECHOCARDIOGRAM COMPLETE 12/10/2021  Echocardiogram 12/10/2021: Left ventricle cavity is normal in size and wall thickness. Normal global wall motion. Normal LV systolic function with EF 61%. Doppler evidence of grade I (impaired) diastolic dysfunction, normal LAP. Mild mitral valve stenosis. Mild tricuspid regurgitation. IVC not seen. No significant change compared to previous study in 2019.   PCV MYOCARDIAL PERFUSION WITH LEXISCAN 01/29/2022 Lexiscan Tetrofosmin stress test 02/01/2022: Lexiscan nuclear stress test performed using 1-day protocol. Normal myocardial perfusion. Stress LVEF 57%. Low risk study.  EKG:   EKG 11/28/2021: Normal sinus rhythm at rate of 64 bpm, left atrial enlargement, otherwise normal EKG.    Assessment     ICD-10-CM   1. Dyspnea on exertion  R06.09        There  are no discontinued medications.   No orders of the defined types were placed in this encounter.  No orders of the defined types were placed in this encounter.  Recommendations:   Sheila Fernandez is a 71 y.o.  AA female  with  hypertension, former tobacco use, followed by [place] for orthostatic hypotension. She has diagnosis of essential hypertension and also sickle cell trait, she is recently being evaluated for right hand weakness by Dr. Metta Clines, neurology on 11/26/2021.    Patient presents for urgent visit at family's request to review daughter's concern of amyloid cardiomyopathy.  Patient's symptoms are unchanged compared to previous office visit.  Reviewed and discussed results of recent stress test which was overall low risk. Reassured family that suspicion for amyloid heart disease is low, again reviewed results of echocardiogram. Would consider evaluation of amyloid related neuropathy, will defer further evaluation and management to neurology.   No changed are made at today's visit. Family is requesting further evaluation of cardiac amyloid and second opinion regarding this. Will therefore refer to second opinion to [facility]  [facility]   Follow up as needed.   Patient was seen in collaboration with Dr. Einar Gip and he is in agreement with the plan.   Alethia Berthold, PA-C 03/06/2022, 3:25 PM Office: [phone]

## 2022-03-06 NOTE — Telephone Encounter (Signed)
Pt seen in office today.

## 2022-03-11 ENCOUNTER — Emergency Department (HOSPITAL_COMMUNITY): Payer: Medicare Other

## 2022-03-11 ENCOUNTER — Encounter (HOSPITAL_COMMUNITY): Payer: Self-pay

## 2022-03-11 ENCOUNTER — Inpatient Hospital Stay (HOSPITAL_COMMUNITY)
Admission: EM | Admit: 2022-03-11 | Discharge: 2022-04-08 | DRG: 004 | Disposition: A | Discharge status: Other Acute Inpatient Hospital | Payer: Medicare Other | Attending: Pulmonary Disease | Admitting: Pulmonary Disease

## 2022-03-11 ENCOUNTER — Other Ambulatory Visit: Payer: Self-pay

## 2022-03-11 DIAGNOSIS — I71019 Dissection of thoracic aorta, unspecified: Secondary | ICD-10-CM | POA: Diagnosis not present

## 2022-03-11 DIAGNOSIS — R339 Retention of urine, unspecified: Secondary | ICD-10-CM | POA: Diagnosis not present

## 2022-03-11 DIAGNOSIS — J9811 Atelectasis: Secondary | ICD-10-CM | POA: Diagnosis present

## 2022-03-11 DIAGNOSIS — R569 Unspecified convulsions: Secondary | ICD-10-CM | POA: Diagnosis not present

## 2022-03-11 DIAGNOSIS — E876 Hypokalemia: Secondary | ICD-10-CM | POA: Diagnosis present

## 2022-03-11 DIAGNOSIS — G909 Disorder of the autonomic nervous system, unspecified: Secondary | ICD-10-CM | POA: Diagnosis not present

## 2022-03-11 DIAGNOSIS — J151 Pneumonia due to Pseudomonas: Secondary | ICD-10-CM

## 2022-03-11 DIAGNOSIS — L89152 Pressure ulcer of sacral region, stage 2: Secondary | ICD-10-CM | POA: Diagnosis not present

## 2022-03-11 DIAGNOSIS — Z833 Family history of diabetes mellitus: Secondary | ICD-10-CM

## 2022-03-11 DIAGNOSIS — N179 Acute kidney failure, unspecified: Secondary | ICD-10-CM | POA: Diagnosis not present

## 2022-03-11 DIAGNOSIS — Z930 Tracheostomy status: Secondary | ICD-10-CM | POA: Diagnosis not present

## 2022-03-11 DIAGNOSIS — Z823 Family history of stroke: Secondary | ICD-10-CM

## 2022-03-11 DIAGNOSIS — G9341 Metabolic encephalopathy: Secondary | ICD-10-CM | POA: Diagnosis not present

## 2022-03-11 DIAGNOSIS — S50821A Blister (nonthermal) of right forearm, initial encounter: Secondary | ICD-10-CM | POA: Diagnosis not present

## 2022-03-11 DIAGNOSIS — D638 Anemia in other chronic diseases classified elsewhere: Secondary | ICD-10-CM | POA: Diagnosis present

## 2022-03-11 DIAGNOSIS — I7101 Dissection of ascending aorta: Principal | ICD-10-CM

## 2022-03-11 DIAGNOSIS — E8729 Other acidosis: Secondary | ICD-10-CM | POA: Diagnosis present

## 2022-03-11 DIAGNOSIS — Z803 Family history of malignant neoplasm of breast: Secondary | ICD-10-CM

## 2022-03-11 DIAGNOSIS — D6489 Other specified anemias: Secondary | ICD-10-CM | POA: Diagnosis present

## 2022-03-11 DIAGNOSIS — I959 Hypotension, unspecified: Secondary | ICD-10-CM | POA: Diagnosis not present

## 2022-03-11 DIAGNOSIS — N1411 Contrast-induced nephropathy: Secondary | ICD-10-CM | POA: Diagnosis not present

## 2022-03-11 DIAGNOSIS — J9621 Acute and chronic respiratory failure with hypoxia: Secondary | ICD-10-CM | POA: Diagnosis not present

## 2022-03-11 DIAGNOSIS — R34 Anuria and oliguria: Secondary | ICD-10-CM | POA: Diagnosis not present

## 2022-03-11 DIAGNOSIS — K5900 Constipation, unspecified: Secondary | ICD-10-CM | POA: Diagnosis present

## 2022-03-11 DIAGNOSIS — R4182 Altered mental status, unspecified: Secondary | ICD-10-CM | POA: Diagnosis not present

## 2022-03-11 DIAGNOSIS — F419 Anxiety disorder, unspecified: Secondary | ICD-10-CM | POA: Diagnosis present

## 2022-03-11 DIAGNOSIS — I10 Essential (primary) hypertension: Secondary | ICD-10-CM | POA: Diagnosis present

## 2022-03-11 DIAGNOSIS — I7100 Dissection of unspecified site of aorta: Secondary | ICD-10-CM | POA: Diagnosis present

## 2022-03-11 DIAGNOSIS — J9622 Acute and chronic respiratory failure with hypercapnia: Secondary | ICD-10-CM | POA: Diagnosis present

## 2022-03-11 DIAGNOSIS — M5412 Radiculopathy, cervical region: Secondary | ICD-10-CM | POA: Diagnosis present

## 2022-03-11 DIAGNOSIS — J690 Pneumonitis due to inhalation of food and vomit: Secondary | ICD-10-CM | POA: Diagnosis not present

## 2022-03-11 DIAGNOSIS — R001 Bradycardia, unspecified: Secondary | ICD-10-CM | POA: Diagnosis not present

## 2022-03-11 DIAGNOSIS — L8990 Pressure ulcer of unspecified site, unspecified stage: Secondary | ICD-10-CM | POA: Insufficient documentation

## 2022-03-11 DIAGNOSIS — Z841 Family history of disorders of kidney and ureter: Secondary | ICD-10-CM

## 2022-03-11 DIAGNOSIS — Z885 Allergy status to narcotic agent status: Secondary | ICD-10-CM

## 2022-03-11 DIAGNOSIS — T508X5A Adverse effect of diagnostic agents, initial encounter: Secondary | ICD-10-CM | POA: Diagnosis not present

## 2022-03-11 DIAGNOSIS — Z6821 Body mass index (BMI) 21.0-21.9, adult: Secondary | ICD-10-CM

## 2022-03-11 DIAGNOSIS — R739 Hyperglycemia, unspecified: Secondary | ICD-10-CM | POA: Diagnosis present

## 2022-03-11 DIAGNOSIS — Z8249 Family history of ischemic heart disease and other diseases of the circulatory system: Secondary | ICD-10-CM

## 2022-03-11 DIAGNOSIS — D573 Sickle-cell trait: Secondary | ICD-10-CM | POA: Diagnosis not present

## 2022-03-11 DIAGNOSIS — R0689 Other abnormalities of breathing: Secondary | ICD-10-CM

## 2022-03-11 DIAGNOSIS — J9602 Acute respiratory failure with hypercapnia: Secondary | ICD-10-CM | POA: Diagnosis not present

## 2022-03-11 DIAGNOSIS — R079 Chest pain, unspecified: Secondary | ICD-10-CM | POA: Diagnosis present

## 2022-03-11 DIAGNOSIS — E43 Unspecified severe protein-calorie malnutrition: Secondary | ICD-10-CM | POA: Diagnosis not present

## 2022-03-11 DIAGNOSIS — G9340 Encephalopathy, unspecified: Secondary | ICD-10-CM | POA: Diagnosis not present

## 2022-03-11 DIAGNOSIS — Z87891 Personal history of nicotine dependence: Secondary | ICD-10-CM

## 2022-03-11 DIAGNOSIS — E870 Hyperosmolality and hypernatremia: Secondary | ICD-10-CM | POA: Diagnosis present

## 2022-03-11 DIAGNOSIS — Z9911 Dependence on respirator [ventilator] status: Secondary | ICD-10-CM | POA: Diagnosis not present

## 2022-03-11 DIAGNOSIS — I951 Orthostatic hypotension: Secondary | ICD-10-CM | POA: Diagnosis not present

## 2022-03-11 DIAGNOSIS — J90 Pleural effusion, not elsewhere classified: Secondary | ICD-10-CM | POA: Diagnosis not present

## 2022-03-11 DIAGNOSIS — J9819 Other pulmonary collapse: Secondary | ICD-10-CM | POA: Diagnosis present

## 2022-03-11 DIAGNOSIS — E861 Hypovolemia: Secondary | ICD-10-CM | POA: Diagnosis present

## 2022-03-11 DIAGNOSIS — R54 Age-related physical debility: Secondary | ICD-10-CM | POA: Diagnosis present

## 2022-03-11 DIAGNOSIS — J9601 Acute respiratory failure with hypoxia: Secondary | ICD-10-CM | POA: Diagnosis not present

## 2022-03-11 DIAGNOSIS — G904 Autonomic dysreflexia: Secondary | ICD-10-CM | POA: Diagnosis not present

## 2022-03-11 DIAGNOSIS — Z79899 Other long term (current) drug therapy: Secondary | ICD-10-CM

## 2022-03-11 DIAGNOSIS — J189 Pneumonia, unspecified organism: Secondary | ICD-10-CM | POA: Diagnosis not present

## 2022-03-11 LAB — CBC
HCT: 36.3 % (ref 36.0–46.0)
Hemoglobin: 11.3 g/dL — ABNORMAL LOW (ref 12.0–15.0)
MCH: 27.6 pg (ref 26.0–34.0)
MCHC: 31.1 g/dL (ref 30.0–36.0)
MCV: 88.5 fL (ref 80.0–100.0)
Platelets: 226 10*3/uL (ref 150–400)
RBC: 4.1 MIL/uL (ref 3.87–5.11)
RDW: 13.8 % (ref 11.5–15.5)
WBC: 4.4 10*3/uL (ref 4.0–10.5)
nRBC: 0 % (ref 0.0–0.2)

## 2022-03-11 LAB — URINALYSIS, ROUTINE W REFLEX MICROSCOPIC
Bacteria, UA: NONE SEEN
Bilirubin Urine: NEGATIVE
Glucose, UA: NEGATIVE mg/dL
Hgb urine dipstick: NEGATIVE
Ketones, ur: 5 mg/dL — AB
Nitrite: NEGATIVE
Protein, ur: NEGATIVE mg/dL
Specific Gravity, Urine: 1.01 (ref 1.005–1.030)
pH: 7 (ref 5.0–8.0)

## 2022-03-11 LAB — COMPREHENSIVE METABOLIC PANEL
ALT: 18 U/L (ref 0–44)
AST: 17 U/L (ref 15–41)
Albumin: 4 g/dL (ref 3.5–5.0)
Alkaline Phosphatase: 58 U/L (ref 38–126)
Anion gap: 8 (ref 5–15)
BUN: 12 mg/dL (ref 8–23)
CO2: 35 mmol/L — ABNORMAL HIGH (ref 22–32)
Calcium: 10 mg/dL (ref 8.9–10.3)
Chloride: 100 mmol/L (ref 98–111)
Creatinine, Ser: 0.64 mg/dL (ref 0.44–1.00)
GFR, Estimated: >60 mL/min (ref >60)
Glucose, Bld: 120 mg/dL — ABNORMAL HIGH (ref 70–99)
Potassium: 3.4 mmol/L — ABNORMAL LOW (ref 3.5–5.1)
Sodium: 143 mmol/L (ref 135–145)
Total Bilirubin: 1.3 mg/dL — ABNORMAL HIGH (ref 0.3–1.2)
Total Protein: 7.7 g/dL (ref 6.5–8.1)

## 2022-03-11 LAB — TROPONIN I (HIGH SENSITIVITY)
Troponin I (High Sensitivity): 20 ng/L — ABNORMAL HIGH (ref <18)
Troponin I (High Sensitivity): 18 ng/L — ABNORMAL HIGH (ref <18)

## 2022-03-11 LAB — LIPASE, BLOOD: Lipase: 21 U/L (ref 11–51)

## 2022-03-11 MED ORDER — ESMOLOL HCL-SODIUM CHLORIDE 2000 MG/100ML IV SOLN
25.0000 ug/kg/min | INTRAVENOUS | Status: DC
  Administered 2022-03-11: 300 ug/kg/min via INTRAVENOUS
  Administered 2022-03-11: 25 ug/kg/min via INTRAVENOUS
  Administered 2022-03-12: 100 ug/kg/min via INTRAVENOUS
  Filled 2022-03-11 (×5): qty 100

## 2022-03-11 MED ORDER — HYDRALAZINE HCL 20 MG/ML IJ SOLN
10.0000 mg | Freq: Once | INTRAMUSCULAR | Status: AC
  Administered 2022-03-11: 10 mg via INTRAVENOUS
  Filled 2022-03-11: qty 1

## 2022-03-11 MED ORDER — DOCUSATE SODIUM 100 MG PO CAPS: 100.0000 mg | ORAL_CAPSULE | Freq: Two times a day (BID) | ORAL | Status: DC | PRN

## 2022-03-11 MED ORDER — ONDANSETRON HCL 4 MG/2ML IJ SOLN: 4.0000 mg | Freq: Four times a day (QID) | INTRAMUSCULAR | Status: DC | PRN

## 2022-03-11 MED ORDER — POLYETHYLENE GLYCOL 3350 17 G PO PACK
17.0000 g | PACK | Freq: Every day | ORAL | Status: DC | PRN
  Administered 2022-03-16: 17 g via ORAL
  Filled 2022-03-11: qty 1

## 2022-03-11 MED ORDER — LOSARTAN POTASSIUM 50 MG PO TABS
100.0000 mg | ORAL_TABLET | Freq: Every day | ORAL | Status: DC
Start: 2022-03-12 — End: 2022-03-12
  Administered 2022-03-11: 100 mg via ORAL
  Filled 2022-03-11: qty 2

## 2022-03-11 MED ORDER — SODIUM CHLORIDE 0.9 % IV SOLN: INTRAVENOUS | Status: DC | PRN

## 2022-03-11 MED ORDER — IOHEXOL 350 MG/ML SOLN
80.0000 mL | Freq: Once | INTRAVENOUS | Status: AC | PRN
  Administered 2022-03-11: 80 mL via INTRAVENOUS

## 2022-03-11 MED ORDER — AMLODIPINE BESYLATE 5 MG PO TABS
2.5000 mg | ORAL_TABLET | Freq: Every day | ORAL | Status: DC
  Administered 2022-03-11: 2.5 mg via ORAL
  Filled 2022-03-11: qty 1

## 2022-03-11 MED ORDER — CARVEDILOL 25 MG PO TABS
25.0000 mg | ORAL_TABLET | Freq: Two times a day (BID) | ORAL | Status: DC
Start: 2022-03-12 — End: 2022-03-12
  Administered 2022-03-11: 25 mg via ORAL
  Filled 2022-03-11: qty 1

## 2022-03-11 MED ORDER — CHLORHEXIDINE GLUCONATE CLOTH 2 % EX PADS
6.0000 | MEDICATED_PAD | Freq: Every day | CUTANEOUS | Status: DC
  Administered 2022-03-12 – 2022-04-08 (×28): 6 via TOPICAL

## 2022-03-11 MED ORDER — PANTOPRAZOLE SODIUM 40 MG IV SOLR
40.0000 mg | Freq: Every day | INTRAVENOUS | Status: DC
  Administered 2022-03-11: 40 mg via INTRAVENOUS
  Filled 2022-03-11: qty 10

## 2022-03-11 MED ORDER — ONDANSETRON HCL 4 MG/2ML IJ SOLN
4.0000 mg | Freq: Once | INTRAMUSCULAR | Status: DC
  Filled 2022-03-11: qty 2

## 2022-03-11 NOTE — ED Notes (Signed)
[facility] contacted to setup transport for pt.

## 2022-03-11 NOTE — ED Provider Triage Note (Signed)
Emergency Medicine Provider Triage Evaluation Note  Sheila Fernandez , a 71 y.o. female  was evaluated in triage.  Pt complains of upper mid abdominal pain onset yesterday.  Has been taking pantoprazole without relief of her symptoms.  Has associated shortness of breath.  Patient also has a myriad of additional concerns as far as fatigue and weight loss over the past 6 months.  She has been evaluated by her entire care team for this with no resolution as of yet.  Denies chest pain, nausea, vomiting, fever, chills, urinary symptoms.  Review of Systems  Positive: As per HPI above Negative:   Physical Exam  BP (!) 169/114 (BP Location: Left Arm)   Pulse 73   Temp 98.9 F (37.2 C) (Oral)   Resp 19   Ht _0  (1.549 m)   Wt 51.7 kg   SpO2 93%   BMI 21.52 kg/m  Gen:   Awake, no distress   Resp:  Normal effort  MSK:   Moves extremities without difficulty  Other:  Tenderness to palpation noted to epigastric region.  Medical Decision Making  Medically screening exam initiated at 12:17 PM.  Appropriate orders placed.  Elody L Marek was informed that the remainder of the evaluation will be completed by another provider, this initial triage assessment does not replace that evaluation, and the importance of remaining in the ED until their evaluation is complete.  Work-up initiated   Zimere Dunlevy A, PA-C 03/11/22 1219

## 2022-03-11 NOTE — ED Provider Notes (Signed)
[facility] DEPT Provider Note   CSN: [id number] Arrival date & time: 03/11/22  1144     History  Chief Complaint  Patient presents with   Abdominal Pain   Back Pain   Shortness of Breath   Fatigue    Sheila Fernandez is a 71 y.o. female hx of HTN, orthostatic hypotension, here presenting with chest pain, abdominal pain, shortness of breath.  Patient has been having fatigue and weight loss for the last 6 months.  Patient states that yesterday she felt unwell.  She had worsening sudden onset of epigastric pain down to her back.  She also has some shortness of breath as well. She states that her blood pressure is always elevated and she has been taking her blood pressure medicine except this morning.  Patient had previous evaluation before and had a CT chest several months ago that showed possible mass versus atelectasis.  The history is provided by the patient.       Home Medications Prior to Admission medications   Medication Sig Start Date End Date Taking? Authorizing Provider  amLODipine (NORVASC) 2.5 MG tablet Take 2.5 mg by mouth daily. 11/08/21   [provider]  carvedilol (COREG) 25 MG tablet Take 25 mg by mouth 2 (two) times daily with a meal.    [provider]  Cholecalciferol (VITAMIN D PO) Take 1 tablet by mouth daily.    [provider]  co-enzyme Q-10 30 MG capsule Take 30 mg by mouth 3 (three) times daily.    [provider]  levocetirizine (XYZAL) 5 MG tablet Take 5 mg by mouth daily. 01/07/22   [provider]  losartan (COZAAR) 100 MG tablet Take 1 tablet (100 mg total) by mouth daily. Pt needs to schedule a f/u appt for further refills. 07/31/20   Adrian Prows, MD  mirtazapine (REMERON) 7.5 MG tablet Take 7.5 mg by mouth at bedtime. 02/21/22   [provider]  Misc Natural Products (DANDELION ROOT PO) Take by mouth.    [provider]  pantoprazole (PROTONIX) 40 MG tablet  Take 40 mg by mouth daily. 02/20/22   [provider]  prednisoLONE acetate (OMNIPRED) 1 % ophthalmic suspension Place 2 drops into the left eye 4 (four) times daily. 03/20/14   Angelica Ran, MD      Allergies    Codeine    Review of Systems   Review of Systems  Respiratory:  Positive for shortness of breath.   Gastrointestinal:  Positive for abdominal pain.  Musculoskeletal:  Positive for back pain.  All other systems reviewed and are negative.   Physical Exam Updated Vital Signs BP (!) 195/114   Pulse 68   Temp (!) 97.2 F (36.2 C) (Oral)   Resp (!) 24   Ht '5\' 1"'$  (1.549 m)   Wt 51.7 kg   SpO2 96%   BMI 21.52 kg/m  Physical Exam Vitals and nursing note reviewed.  Constitutional:      Comments: Chronically ill, uncomfortable  HENT:     Head: Normocephalic.     Mouth/Throat:     Mouth: Mucous membranes are moist.  Eyes:     Extraocular Movements: Extraocular movements intact.     Pupils: Pupils are equal, round, and reactive to light.  Cardiovascular:     Rate and Rhythm: Normal rate and regular rhythm.  Abdominal:     General: Abdomen is flat.     Comments: Mild epigastric tenderness   Skin:  General: Skin is warm.     Capillary Refill: Capillary refill takes less than 2 seconds.  Neurological:     General: No focal deficit present.     Mental Status: She is oriented to person, place, and time.  Psychiatric:        Mood and Affect: Mood normal.        Behavior: Behavior normal.     ED Results / Procedures / Treatments   Labs (all labs ordered are listed, but only abnormal results are displayed) Labs Reviewed  COMPREHENSIVE METABOLIC PANEL - Abnormal; Notable for the following components:      Result Value   Potassium 3.4 (*)    CO2 35 (*)    Glucose, Bld 120 (*)    Total Bilirubin 1.3 (*)    All other components within normal limits  CBC - Abnormal; Notable for the following components:   Hemoglobin 11.3 (*)    All other components  within normal limits  URINALYSIS, ROUTINE W REFLEX MICROSCOPIC - Abnormal; Notable for the following components:   Ketones, ur 5 (*)    Leukocytes,Ua SMALL (*)    All other components within normal limits  LIPASE, BLOOD  TROPONIN I (HIGH SENSITIVITY)    EKG EKG Interpretation  Date/Time:  Tuesday March 11 2022 11:57:04 EDT Ventricular Rate:  64 PR Interval:  146 QRS Duration: 96 QT Interval:  387 QTC Calculation: 400 R Axis:   43 Text Interpretation: Sinus rhythm Probable left atrial enlargement Borderline T abnormalities, anterior leads No significant change since last tracing Confirmed by Dorie Rank [phone]) on 03/11/2022 12:04:56 PM  Radiology DG Chest 2 View  Result Date: 03/11/2022 CLINICAL DATA:  Shortness of breath. EXAM: CHEST - 2 VIEW COMPARISON:  January 10, 2022. FINDINGS: Stable cardiomediastinal silhouette. Left lung is clear. Mild right middle lobe atelectasis or infiltrate is noted. Bony thorax is unremarkable. IMPRESSION: Mild right middle lobe atelectasis or infiltrate. Electronically Signed   By: Marijo Conception M.D.   On: 03/11/2022 12:29    Procedures Procedures   CRITICAL CARE Performed by: Wandra Arthurs   Total critical care time: 40 minutes  Critical care time was exclusive of separately billable procedures and treating other patients.  Critical care was necessary to treat or prevent imminent or life-threatening deterioration.  Critical care was time spent personally by me on the following activities: development of treatment plan with patient and/or surrogate as well as nursing, discussions with consultants, evaluation of patient's response to treatment, examination of patient, obtaining history from patient or surrogate, ordering and performing treatments and interventions, ordering and review of laboratory studies, ordering and review of radiographic studies, pulse oximetry and re-evaluation of patient's condition.   Medications Ordered in ED Medications   ondansetron (ZOFRAN) injection 4 mg (4 mg Intravenous Not Given 03/11/22 1839)  hydrALAZINE (APRESOLINE) injection 10 mg (10 mg Intravenous Given 03/11/22 1838)    ED Course/ Medical Decision Making/ A&P                           Medical Decision Making Sheila Fernandez is a 71 y.o. female here with abdominal pain, chest pain. Patient has failure to thrive and losing weight for the last several months.  Patient was started on Protonix with no relief.  Patient had sudden worsening epigastric pain and back pain.  Patient is very hypertensive in the ED.  Concern for possible dissection.  Also consider pancreatitis versus gastritis.  Plan to get CBC and CMP and troponin and lipase and CT dissection study.  We will give IV hydralazine.   7:32 PM Radiology called me and I reviewed her CT independently.  There is a concern for possible type a dissection.  Recommend a gated CTA study or echo.  I discussed case with Dr. Cyndia Bent from [facility].  He states that this is likely artifact.  However, he wants to be absolutely sure so recommend CT coronaries.  He does recommend IV esmolol to control the blood pressure and he will see patient as a consult but recommend ICU be the primary team.  I discussed case with Dr. Patsey Berthold from ICU and she will be the primary team.  She recommend transfer [facility].  She also recommend arterial line.   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 Decision-making details documented in ED Course. Radiology: ordered and independent interpretation performed. Decision-making details documented in ED Course.  Risk Prescription drug management. Decision regarding hospitalization.    Final Clinical Impression(s) / ED Diagnoses Final diagnoses:  None    Rx / DC Orders ED Discharge Orders     None         Drenda Freeze, MD 03/11/22 2010    Drenda Freeze,  MD 03/12/22 [id number]

## 2022-03-11 NOTE — H&P (Signed)
NAME:  Sheila Fernandez, MRN:  [id number], DOB:  1950-10-07, LOS: 0 ADMISSION DATE:  03/11/2022, CONSULTATION DATE:  03/11/22 REFERRING MD:  Sheila Fernandez, CHIEF COMPLAINT:  Chest pain, abdominal pain  History of Present Illness:  71 yo woman with chest pain, abdominal pain, SOB. Sudden onset epigastric pain radiating to back since yesterday.  SOB.  BP elevated always, missed her bp meds this am.  CT chest several months ago, mass vs atelectasis.  Here with mid abdominal pain since yesterday.  Low back pain.  SOB, fatigue x 3 months Fatigue and weight loss x 6 months.   Recent abdominal pain, started on Sheila Fernandez   lipase: 21 Bili 1.3 ap normal Trop 20  Pertinent  Medical History  HTN Sheila Fernandez  Anemia Arthritis Iritis, Recurrent Sickle cell trait  Sheila Fernandez 2.5, Sheila Fernandez 25 BID, Sheila Fernandez  Sheila Fernandez '5mg'$  daily Sheila Fernandez '100mg'$  daily Sheila Fernandez 7.5 mg dialy Sheila Fernandez '40mg'$  Daily  Sheila Fernandez 1% 2 drops into the L eye QID     Sheila Fernandez ECHOCARDIOGRAM COMPLETE 12/10/2021  Echocardiogram 12/10/2021: Left ventricle cavity is normal in size and wall thickness. Normal global wall motion. Normal LV systolic function with EF 61%. Doppler evidence of grade I (impaired) diastolic dysfunction, normal Sheila Fernandez. Mild mitral valve stenosis. Mild tricuspid regurgitation. Sheila Fernandez not seen. No significant change compared to previous study in 2019.  Sheila Fernandez MYOCARDIAL PERFUSION WITH Sheila 01/29/2022 Sheila Fernandez 02/01/2022: Sheila nuclear stress Fernandez performed using 1-day protocol. Normal myocardial perfusion. Stress LVEF 57%. Low risk study.  Significant Hospital Events: Including procedures, antibiotic start and stop dates in addition to other pertinent events     Interim History / Subjective:  ***  Objective   Blood pressure (!) 191/163, pulse 79, temperature (!) 97.2 F (36.2 C), temperature source Oral, resp. rate (!) 25, height '5\' 1"'$  (1.549 m), weight 51.7 kg, SpO2 97 %.       No  intake or output data in the 24 hours ending 03/11/22 2011 Filed Weights   03/11/22 1200  Weight: 51.7 kg    Examination: General: *** HENT: *** Lungs: *** Cardiovascular: *** Abdomen: *** Extremities: *** Neuro: *** GU: ***  Resolved Hospital Problem list   ***  Assessment & Plan:  *** Multiple recent symptoms Chest and abdominal pain, low back pain.  Dysphonia Constipation  Weakness in Sheila Fernandez Weight loss  "Congestion" and cough, improved now.  SOB (no parenchymal abnormalities on CT chest aside from minimal atelectasis).   Hypercarbia on Chem 7, ABG pending.  Consider Sheila Fernandez?   Best Practice (right click and "Reselect all SmartList Selections" daily)   Diet/type: {diet type:25684} DVT prophylaxis: {anticoagulation (Optional):25687} GI prophylaxis: {JK:93267} Lines: {Central Venous Access:25771} Foley:  {Central Venous Access:25691} Code Status:  {Code Status:26939} Last date of multidisciplinary goals of care discussion [***]  Labs   CBC: Recent Labs  Lab 03/11/22 1229  WBC 4.4  HGB 11.3*  HCT 36.3  MCV 88.5  PLT 124    Basic Metabolic Panel: Recent Labs  Lab 03/11/22 1229  NA 143  K 3.4*  CL 100  CO2 35*  GLUCOSE 120*  BUN 12  CREATININE 0.64  CALCIUM 10.0   GFR: Estimated Creatinine Clearance: 48.7 mL/min (by C-G formula based on SCr of 0.64 mg/dL). Recent Labs  Lab 03/11/22 1229  WBC 4.4    Liver Function Tests: Recent Labs  Lab 03/11/22 1229  AST 17  ALT 18  ALKPHOS 58  BILITOT 1.3*  PROT 7.7  ALBUMIN 4.0   Recent  Labs  Lab 03/11/22 1229  LIPASE 21   No results for input(s): "AMMONIA" in the last 168 hours.  ABG No results found for: "PHART", "PCO2ART", "PO2ART", "HCO3", "TCO2", "ACIDBASEDEF", "O2SAT"   Coagulation Profile: No results for input(s): "INR", "PROTIME" in the last 168 hours.  Cardiac Enzymes: No results for input(s): "CKTOTAL", "CKMB", "CKMBINDEX", "TROPONINI" in the last 168 hours.  HbA1C: Hemoglobin  A1C  Date/Time Value Ref Range Status  04/16/2015 04:25 PM 5.5  Final   Hgb A1c MFr Bld  Date/Time Value Ref Range Status  09/06/2013 02:25 AM 5.5 <5.7 % Final    Comment:    (NOTE)                                                                       According to the Sheila Fernandez Sheila Fernandez for 2011, when HbA1c is used as a screening Fernandez:  >=6.5%   Diagnostic of Sheila Fernandez           (if abnormal result is confirmed) 5.7-6.4%   Increased risk of developing Sheila Fernandez References:Diagnosis and Classification of Sheila Fernandez,Sheila Fernandez CZYS,0630,16(WFUXN 1):S62-S69 and Standards of Medical Care in         Sheila Fernandez - 2011,Sheila Fernandez ATFT,7322,02 (Suppl 1):S11-S61.    CBG: No results for input(s): "GLUCAP" in the last 168 hours.  Review of Systems:   ***  Past Medical History:  She,  has a past medical history of Sheila Fernandez, Anemia, Arthritis, Hypertension, Iritis, recurrent, Sheila Fernandez (01/21/2019), Sickle cell trait (Sheila Fernandez), and Sheila Fernandez.   Surgical History:   Past Surgical History:  Procedure Laterality Date   Sheila Fernandez Left 1986   benign excisional no scar seen    Sheila LUMPECTOMY Left    Pt does not recall having lumpectomy   Sheila Fernandez     Sheila Fernandez     FOOT SURGERY     cyst removed     Social History:   reports that she quit smoking about 18 years ago. Her smoking use included cigarettes. She has a 15.00 pack-year smoking history. She has never used smokeless tobacco. She reports that she does not drink alcohol and does not use drugs.   Family History:  Her family history includes Sheila cancer in her sister; Cancer in her brother and sister; Cancer (age of onset: 46) in her mother; Cancer (age of onset: 29) in her father; Sheila Fernandez in her brother, maternal grandmother, and sister; Sheila Fernandez in her sister; Hypertension in her brother, father, and mother; Kidney disease in her brother; Sheila Fernandez in her daughter;  Stroke in her father. There is no history of Colon cancer, Esophageal cancer, Stomach cancer, or Colon polyps.   Allergies Allergies  Allergen Reactions   Codeine Nausea And Vomiting     Home Medications  Prior to Admission medications   Medication Sig Start Date End Date Taking? Authorizing Provider  amLODipine (Sheila Fernandez) 2.5 MG tablet Take 2.5 mg by mouth daily. 11/08/21   [provider]  carvedilol (COREG) 25 MG tablet Take 25 mg by mouth 2 (two) times daily with a meal.    [provider]  Cholecalciferol (VITAMIN Fernandez PO) Take 1 tablet by mouth daily.    [provider]  co-enzyme Q-10 30 MG capsule  Take 30 mg by mouth 3 (three) times daily.    [provider]  levocetirizine (Sheila Fernandez) 5 MG tablet Take 5 mg by mouth daily. 01/07/22   [provider]  Sheila Fernandez (COZAAR) 100 MG tablet Take 1 tablet (100 mg total) by mouth daily. Pt needs to schedule a f/u appt for further refills. 07/31/20   Adrian Prows, MD  mirtazapine (Sheila Fernandez) 7.5 MG tablet Take 7.5 mg by mouth at bedtime. 02/21/22   [provider]  Misc Natural Products (DANDELION ROOT PO) Take by mouth.    [provider]  pantoprazole (Sheila Fernandez) 40 MG tablet Take 40 mg by mouth daily. 02/20/22   [provider]  Sheila Fernandez acetate (OMNIPRED) 1 % ophthalmic suspension Place 2 drops into the left eye 4 (four) times daily. 03/20/14   Angelica Ran, MD     Critical care time: ***

## 2022-03-11 NOTE — Procedures (Signed)
Arterial Catheter Insertion Procedure Note  Sheila Fernandez  [id number]  1951/06/03  Date:03/11/22  Time:8:58 PM    Provider Performing: Rosann Auerbach    Procedure: Insertion of Arterial Line [phone]) without US guidance  Indication(s) Blood pressure monitoring and/or need for frequent ABGs  Consent Risks of the procedure as well as the alternatives and risks of each were explained to the patient and/or caregiver.  Consent for the procedure was obtained and is signed in the bedside chart  Anesthesia None   Time Out Verified patient identification, verified procedure, site/side was marked, verified correct patient position, special equipment/implants available, medications/allergies/relevant history reviewed, required imaging and test results available.   Sterile Technique Maximal sterile technique including full sterile barrier drape, hand hygiene, sterile gown, sterile gloves, mask, hair covering, sterile ultrasound probe cover (if used).   Procedure Description Area of catheter insertion was cleaned with chlorhexidine and draped in sterile fashion. With real-time ultrasound guidance an arterial catheter was placed into the left radial artery.  Appropriate arterial tracings confirmed on monitor.     Complications/Tolerance None; patient tolerated the procedure well.   EBL Minimal   Specimen(s) None

## 2022-03-11 NOTE — ED Triage Notes (Signed)
Patient states she is having upper and mid abdominal pain that started yesterday. Today, the patient reports that she is also having lower back pain.  Patient states she has had SOB and fatigue x 3 months.

## 2022-03-12 ENCOUNTER — Inpatient Hospital Stay (HOSPITAL_COMMUNITY): Payer: Medicare Other

## 2022-03-12 ENCOUNTER — Inpatient Hospital Stay: Payer: Self-pay

## 2022-03-12 DIAGNOSIS — R4182 Altered mental status, unspecified: Secondary | ICD-10-CM

## 2022-03-12 DIAGNOSIS — E43 Unspecified severe protein-calorie malnutrition: Secondary | ICD-10-CM | POA: Insufficient documentation

## 2022-03-12 DIAGNOSIS — J9601 Acute respiratory failure with hypoxia: Secondary | ICD-10-CM

## 2022-03-12 DIAGNOSIS — I71019 Dissection of thoracic aorta, unspecified: Secondary | ICD-10-CM | POA: Diagnosis not present

## 2022-03-12 LAB — POCT I-STAT 7, (LYTES, BLD GAS, ICA,H+H)
Acid-Base Excess: 10 mmol/L — ABNORMAL HIGH (ref 0.0–2.0)
Acid-Base Excess: 13 mmol/L — ABNORMAL HIGH (ref 0.0–2.0)
Acid-Base Excess: 10 mmol/L — ABNORMAL HIGH (ref 0.0–2.0)
Bicarbonate: 36.3 mmol/L — ABNORMAL HIGH (ref 20.0–28.0)
Bicarbonate: 37.4 mmol/L — ABNORMAL HIGH (ref 20.0–28.0)
Bicarbonate: 33.1 mmol/L — ABNORMAL HIGH (ref 20.0–28.0)
Calcium, Ion: 1.28 mmol/L (ref 1.15–1.40)
Calcium, Ion: 1.17 mmol/L (ref 1.15–1.40)
Calcium, Ion: 1.21 mmol/L (ref 1.15–1.40)
HCT: 40 % (ref 36.0–46.0)
HCT: 27 % — ABNORMAL LOW (ref 36.0–46.0)
HCT: 29 % — ABNORMAL LOW (ref 36.0–46.0)
Hemoglobin: 13.6 g/dL (ref 12.0–15.0)
Hemoglobin: 9.2 g/dL — ABNORMAL LOW (ref 12.0–15.0)
Hemoglobin: 9.9 g/dL — ABNORMAL LOW (ref 12.0–15.0)
O2 Saturation: 93 %
O2 Saturation: 100 %
O2 Saturation: 100 %
Patient temperature: 37
Patient temperature: 83
Patient temperature: 98.1
Potassium: 3.4 mmol/L — ABNORMAL LOW (ref 3.5–5.1)
Potassium: 3.3 mmol/L — ABNORMAL LOW (ref 3.5–5.1)
Potassium: 3.4 mmol/L — ABNORMAL LOW (ref 3.5–5.1)
Sodium: 141 mmol/L (ref 135–145)
Sodium: 145 mmol/L (ref 135–145)
Sodium: 142 mmol/L (ref 135–145)
TCO2: 38 mmol/L — ABNORMAL HIGH (ref 22–32)
TCO2: 39 mmol/L — ABNORMAL HIGH (ref 22–32)
TCO2: 34 mmol/L — ABNORMAL HIGH (ref 22–32)
pCO2 arterial: 56.5 mmHg — ABNORMAL HIGH (ref 32–48)
pCO2 arterial: 32.2 mmHg (ref 32–48)
pCO2 arterial: 39.1 mmHg (ref 32–48)
pH, Arterial: 7.416 (ref 7.35–7.45)
pH, Arterial: 7.643 (ref 7.35–7.45)
pH, Arterial: 7.535 — ABNORMAL HIGH (ref 7.35–7.45)
pO2, Arterial: 67 mmHg — ABNORMAL LOW (ref 83–108)
pO2, Arterial: 186 mmHg — ABNORMAL HIGH (ref 83–108)
pO2, Arterial: 188 mmHg — ABNORMAL HIGH (ref 83–108)

## 2022-03-12 LAB — LACTIC ACID, PLASMA
Lactic Acid, Venous: 0.9 mmol/L (ref 0.5–1.9)
Lactic Acid, Venous: 1.3 mmol/L (ref 0.5–1.9)
Lactic Acid, Venous: 2.6 mmol/L (ref 0.5–1.9)

## 2022-03-12 LAB — TSH: TSH: 1.131 u[IU]/mL (ref 0.350–4.500)

## 2022-03-12 LAB — CBC WITH DIFFERENTIAL/PLATELET
Abs Immature Granulocytes: 0.05 10*3/uL (ref 0.00–0.07)
Basophils Absolute: 0 10*3/uL (ref 0.0–0.1)
Basophils Relative: 0 %
Eosinophils Absolute: 0 10*3/uL (ref 0.0–0.5)
Eosinophils Relative: 0 %
HCT: 29.9 % — ABNORMAL LOW (ref 36.0–46.0)
Hemoglobin: 9.5 g/dL — ABNORMAL LOW (ref 12.0–15.0)
Immature Granulocytes: 1 %
Lymphocytes Relative: 20 %
Lymphs Abs: 0.9 10*3/uL (ref 0.7–4.0)
MCH: 28.4 pg (ref 26.0–34.0)
MCHC: 31.8 g/dL (ref 30.0–36.0)
MCV: 89.3 fL (ref 80.0–100.0)
Monocytes Absolute: 0.1 10*3/uL (ref 0.1–1.0)
Monocytes Relative: 1 %
Neutro Abs: 3.3 10*3/uL (ref 1.7–7.7)
Neutrophils Relative %: 78 %
Platelets: 202 10*3/uL (ref 150–400)
RBC: 3.35 MIL/uL — ABNORMAL LOW (ref 3.87–5.11)
RDW: 13.8 % (ref 11.5–15.5)
WBC: 4.3 10*3/uL (ref 4.0–10.5)
nRBC: 0 % (ref 0.0–0.2)

## 2022-03-12 LAB — PHOSPHORUS: Phosphorus: 2.4 mg/dL — ABNORMAL LOW (ref 2.5–4.6)

## 2022-03-12 LAB — GLUCOSE, CAPILLARY
Glucose-Capillary: 144 mg/dL — ABNORMAL HIGH (ref 70–99)
Glucose-Capillary: 83 mg/dL (ref 70–99)
Glucose-Capillary: 148 mg/dL — ABNORMAL HIGH (ref 70–99)

## 2022-03-12 LAB — BLOOD GAS, VENOUS
Acid-Base Excess: 13 mmol/L — ABNORMAL HIGH (ref 0.0–2.0)
Bicarbonate: 47.7 mmol/L — ABNORMAL HIGH (ref 20.0–28.0)
Drawn by: 4956
O2 Saturation: 97.8 %
Patient temperature: 36.8
pCO2, Ven: >123 mmHg (ref 44–60)
pH, Ven: 7.15 — CL (ref 7.25–7.43)
pO2, Ven: 236 mmHg — ABNORMAL HIGH (ref 32–45)

## 2022-03-12 LAB — C-REACTIVE PROTEIN: CRP: 0.6 mg/dL (ref <1.0)

## 2022-03-12 LAB — BASIC METABOLIC PANEL
Anion gap: 6 (ref 5–15)
BUN: 14 mg/dL (ref 8–23)
CO2: 41 mmol/L — ABNORMAL HIGH (ref 22–32)
Calcium: 8.9 mg/dL (ref 8.9–10.3)
Chloride: 98 mmol/L (ref 98–111)
Creatinine, Ser: 0.81 mg/dL (ref 0.44–1.00)
GFR, Estimated: >60 mL/min (ref >60)
Glucose, Bld: 158 mg/dL — ABNORMAL HIGH (ref 70–99)
Potassium: 3.3 mmol/L — ABNORMAL LOW (ref 3.5–5.1)
Sodium: 145 mmol/L (ref 135–145)

## 2022-03-12 LAB — BODY FLUID CELL COUNT WITH DIFFERENTIAL: Total Nucleated Cell Count, Fluid: 8 cu mm (ref 0–1000)

## 2022-03-12 LAB — VITAMIN B12: Vitamin B-12: 532 pg/mL (ref 180–914)

## 2022-03-12 LAB — MAGNESIUM: Magnesium: 1.9 mg/dL (ref 1.7–2.4)

## 2022-03-12 LAB — SEDIMENTATION RATE: Sed Rate: 13 mm/hr (ref 0–22)

## 2022-03-12 LAB — CK: Total CK: 34 U/L — ABNORMAL LOW (ref 38–234)

## 2022-03-12 LAB — MRSA NEXT GEN BY PCR, NASAL: MRSA by PCR Next Gen: NOT DETECTED

## 2022-03-12 MED ORDER — ORAL CARE MOUTH RINSE
15.0000 mL | OROMUCOSAL | Status: DC
  Administered 2022-03-12 (×4): 15 mL via OROMUCOSAL

## 2022-03-12 MED ORDER — PANTOPRAZOLE 2 MG/ML SUSPENSION: 40.0000 mg | Freq: Every day | ORAL | Status: DC

## 2022-03-12 MED ORDER — SODIUM BICARBONATE 8.4 % IV SOLN
100.0000 meq | Freq: Once | INTRAVENOUS | Status: AC
  Administered 2022-03-12: 100 meq via INTRAVENOUS

## 2022-03-12 MED ORDER — BOOST / RESOURCE BREEZE PO LIQD CUSTOM: 1.0000 | Freq: Three times a day (TID) | ORAL | Status: DC

## 2022-03-12 MED ORDER — THIAMINE HCL 100 MG PO TABS
100.0000 mg | ORAL_TABLET | Freq: Every day | ORAL | Status: DC
  Administered 2022-03-12 – 2022-03-13 (×2): 100 mg
  Filled 2022-03-12 (×2): qty 1

## 2022-03-12 MED ORDER — DEXMEDETOMIDINE HCL IN NACL 400 MCG/100ML IV SOLN
0.0000 ug/kg/h | INTRAVENOUS | Status: DC
  Administered 2022-03-12: 0.4 ug/kg/h via INTRAVENOUS
  Filled 2022-03-12 (×2): qty 100

## 2022-03-12 MED ORDER — FENTANYL CITRATE PF 50 MCG/ML IJ SOSY
25.0000 ug | PREFILLED_SYRINGE | INTRAMUSCULAR | Status: DC | PRN
  Administered 2022-03-12: 50 ug via INTRAVENOUS
  Filled 2022-03-12: qty 2
  Filled 2022-03-12: qty 1

## 2022-03-12 MED ORDER — PANTOPRAZOLE SODIUM 40 MG IV SOLR
40.0000 mg | INTRAVENOUS | Status: DC
  Administered 2022-03-12 – 2022-03-13 (×2): 40 mg via INTRAVENOUS
  Filled 2022-03-12 (×2): qty 10

## 2022-03-12 MED ORDER — ORAL CARE MOUTH RINSE: 15.0000 mL | OROMUCOSAL | Status: DC | PRN

## 2022-03-12 MED ORDER — SODIUM BICARBONATE 8.4 % IV SOLN
INTRAVENOUS | Status: AC
  Administered 2022-03-12: 100 meq via INTRAVENOUS
  Filled 2022-03-12: qty 100

## 2022-03-12 MED ORDER — KETAMINE HCL 50 MG/5ML IJ SOSY
PREFILLED_SYRINGE | INTRAMUSCULAR | Status: AC
  Filled 2022-03-12: qty 5

## 2022-03-12 MED ORDER — FENTANYL CITRATE PF 50 MCG/ML IJ SOSY: 25.0000 ug | PREFILLED_SYRINGE | INTRAMUSCULAR | Status: DC | PRN

## 2022-03-12 MED ORDER — POTASSIUM CHLORIDE 20 MEQ PO PACK
40.0000 meq | PACK | Freq: Once | ORAL | Status: AC
  Administered 2022-03-12: 40 meq
  Filled 2022-03-12: qty 2

## 2022-03-12 MED ORDER — MIDAZOLAM HCL 2 MG/2ML IJ SOLN: 1.0000 mg | INTRAMUSCULAR | Status: DC | PRN

## 2022-03-12 MED ORDER — NOREPINEPHRINE 4 MG/250ML-% IV SOLN
0.0000 ug/min | INTRAVENOUS | Status: DC
  Administered 2022-03-12: 4 ug/min via INTRAVENOUS
  Administered 2022-03-12: 10 ug/min via INTRAVENOUS
  Filled 2022-03-12: qty 250

## 2022-03-12 MED ORDER — ROCURONIUM BROMIDE 10 MG/ML (PF) SYRINGE
PREFILLED_SYRINGE | INTRAVENOUS | Status: AC
  Administered 2022-03-12: 80 mg
  Filled 2022-03-12: qty 10

## 2022-03-12 MED ORDER — SODIUM CHLORIDE 0.9 % IV SOLN
250.0000 mL | INTRAVENOUS | Status: DC
  Administered 2022-03-13: 250 mL via INTRAVENOUS

## 2022-03-12 MED ORDER — SUCCINYLCHOLINE CHLORIDE 200 MG/10ML IV SOSY
PREFILLED_SYRINGE | INTRAVENOUS | Status: AC
  Filled 2022-03-12: qty 10

## 2022-03-12 MED ORDER — MAGNESIUM SULFATE 2 GM/50ML IV SOLN
2.0000 g | Freq: Once | INTRAVENOUS | Status: AC
Start: 2022-03-12 — End: 2022-03-12
  Administered 2022-03-12: 2 g via INTRAVENOUS
  Filled 2022-03-12: qty 50

## 2022-03-12 MED ORDER — SODIUM CHLORIDE 0.9 % IV BOLUS
500.0000 mL | Freq: Once | INTRAVENOUS | Status: AC
Start: 2022-03-12 — End: 2022-03-12
  Administered 2022-03-12: 500 mL via INTRAVENOUS

## 2022-03-12 MED ORDER — MIDAZOLAM HCL 2 MG/2ML IJ SOLN
INTRAMUSCULAR | Status: AC
  Administered 2022-03-12: 2 mg
  Filled 2022-03-12: qty 2

## 2022-03-12 MED ORDER — SODIUM CHLORIDE 0.9% FLUSH
10.0000 mL | Freq: Two times a day (BID) | INTRAVENOUS | Status: DC
  Administered 2022-03-12 – 2022-03-13 (×2): 10 mL
  Administered 2022-03-13: 20 mL
  Administered 2022-03-14 – 2022-03-16 (×4): 10 mL
  Administered 2022-03-17 – 2022-03-18 (×2): 40 mL
  Administered 2022-03-19 – 2022-03-26 (×11): 10 mL
  Administered 2022-03-27: 40 mL
  Administered 2022-03-27: 10 mL
  Administered 2022-03-28: 40 mL
  Administered 2022-03-28 – 2022-03-29 (×2): 30 mL
  Administered 2022-03-29: 10 mL
  Administered 2022-03-30: 40 mL
  Administered 2022-03-30: 10 mL
  Administered 2022-03-31 (×2): 30 mL
  Administered 2022-04-01 – 2022-04-02 (×3): 10 mL
  Administered 2022-04-02: 30 mL
  Administered 2022-04-03 – 2022-04-04 (×3): 10 mL
  Administered 2022-04-04: 30 mL
  Administered 2022-04-05 – 2022-04-08 (×7): 10 mL

## 2022-03-12 MED ORDER — PHENYLEPHRINE HCL-NACL 20-0.9 MG/250ML-% IV SOLN
25.0000 ug/min | INTRAVENOUS | Status: DC
  Administered 2022-03-12: 200 ug/min via INTRAVENOUS

## 2022-03-12 MED ORDER — FENTANYL CITRATE PF 50 MCG/ML IJ SOSY
PREFILLED_SYRINGE | INTRAMUSCULAR | Status: AC
  Filled 2022-03-12: qty 2

## 2022-03-12 MED ORDER — POLYETHYLENE GLYCOL 3350 17 G PO PACK: 17.0000 g | PACK | Freq: Every day | ORAL | Status: DC

## 2022-03-12 MED ORDER — ROCURONIUM BROMIDE 10 MG/ML (PF) SYRINGE
PREFILLED_SYRINGE | INTRAVENOUS | Status: AC
  Filled 2022-03-12: qty 10

## 2022-03-12 MED ORDER — ADULT MULTIVITAMIN W/MINERALS CH
1.0000 | ORAL_TABLET | Freq: Every day | ORAL | Status: DC
  Administered 2022-03-12 – 2022-03-13 (×2): 1
  Filled 2022-03-12 (×2): qty 1

## 2022-03-12 MED ORDER — LACTATED RINGERS IV BOLUS
1000.0000 mL | Freq: Once | INTRAVENOUS | Status: AC
  Administered 2022-03-12: 1000 mL via INTRAVENOUS

## 2022-03-12 MED ORDER — VITAL AF 1.2 CAL PO LIQD
1000.0000 mL | ORAL | Status: DC
  Administered 2022-03-12: 1000 mL

## 2022-03-12 MED ORDER — DOCUSATE SODIUM 50 MG/5ML PO LIQD
100.0000 mg | Freq: Two times a day (BID) | ORAL | Status: DC
  Administered 2022-03-12: 100 mg
  Filled 2022-03-12: qty 10

## 2022-03-12 MED ORDER — ORAL CARE MOUTH RINSE: 15.0000 mL | OROMUCOSAL | Status: DC

## 2022-03-12 MED ORDER — MIDAZOLAM HCL 2 MG/2ML IJ SOLN
1.0000 mg | INTRAMUSCULAR | Status: DC | PRN
Start: 2022-03-12 — End: 2022-03-13

## 2022-03-12 MED ORDER — ETOMIDATE 2 MG/ML IV SOLN
INTRAVENOUS | Status: AC
  Administered 2022-03-12: 20 mg
  Filled 2022-03-12: qty 20

## 2022-03-12 MED ORDER — CHLORHEXIDINE GLUCONATE 0.12% ORAL RINSE (MEDLINE KIT)
15.0000 mL | Freq: Two times a day (BID) | OROMUCOSAL | Status: DC
  Administered 2022-03-12 – 2022-03-13 (×3): 15 mL via OROMUCOSAL

## 2022-03-12 MED ORDER — LACTATED RINGERS IV BOLUS
1000.0000 mL | Freq: Once | INTRAVENOUS | Status: AC
Start: 2022-03-12 — End: 2022-03-12
  Administered 2022-03-12: 1000 mL via INTRAVENOUS

## 2022-03-12 MED ORDER — NOREPINEPHRINE 4 MG/250ML-% IV SOLN
2.0000 ug/min | INTRAVENOUS | Status: DC
  Administered 2022-03-12: 6 ug/min via INTRAVENOUS
  Administered 2022-03-12: 8 ug/min via INTRAVENOUS
  Filled 2022-03-12: qty 250

## 2022-03-12 MED ORDER — SODIUM CHLORIDE 0.9% FLUSH: 10.0000 mL | INTRAVENOUS | Status: DC | PRN

## 2022-03-12 MED ORDER — ORAL CARE MOUTH RINSE
15.0000 mL | OROMUCOSAL | Status: DC
  Administered 2022-03-12 – 2022-03-13 (×7): 15 mL via OROMUCOSAL

## 2022-03-12 MED ORDER — PHENYLEPHRINE HCL-NACL 20-0.9 MG/250ML-% IV SOLN
INTRAVENOUS | Status: AC
  Filled 2022-03-12: qty 250

## 2022-03-12 NOTE — Procedures (Signed)
Patient Name: Sheila Fernandez  MRN: [id number]  Epilepsy Attending: Lora Havens  Referring Physician/Provider: Lestine Mount, PA-C  Date: 03/12/2022 Duration: 25.24 mins  Patient history: 71 year old female with altered mental status.  EEG to evaluate for seizure.  Level of alertness: Awake, asleep  AEDs during EEG study: versed  Technical aspects: This EEG study was done with scalp electrodes positioned according to the 10-20 International system of electrode placement. Electrical activity was acquired at a sampling rate of '500Hz'$  and reviewed with a high frequency filter of '70Hz'$  and a low frequency filter of '1Hz'$ . EEG data were recorded continuously and digitally stored.   Description: No clear posterior dominant rhythm was seen.  Sleep was characterized by vertex waves, sleep spindles (12 to 14 Hz), maximal frontocentral region.  There is also 15 to 18 Hz beta activity distributed symmetrically and diffusely. Hyperventilation and photic stimulation were not performed.     ABNORMALITY - Excessive beta, generalized  IMPRESSION: This study is within normal limits. The excessive beta activity seen in the background is most likely due to the effect of benzodiazepine and is a benign EEG pattern. No seizures or epileptiform discharges were seen throughout the recording.  [name] [name]

## 2022-03-12 NOTE — Procedures (Signed)
Intubation Procedure Note  Maleiyah KHRYSTYNA SCHWALM  [id number]  September 26, 1951  Date:03/12/22  Time:7:36 AM   Provider Performing:Evellyn Tuff Duwayne Heck    Procedure: Intubation (63817)  Indication(s) Altered, non responsive.   Was initially hypotensive, remained completely unresponsive despite improvement on blood pressure.   Consent Emergent procedure. Unable to consent.   Anesthesia Etomidate, Versed, and Rocuronium   Time Out Verified patient identification, verified procedure, site/side was marked, verified correct patient position, special equipment/implants available, medications/allergies/relevant history reviewed, required imaging and test results available.   Sterile Technique Usual hand hygeine, masks, and gloves were used   Procedure Description Patient positioned in bed supine.  Sedation given as noted above.  Patient was intubated with endotracheal tube using Glidescope.  View was Grade 1 full glottis .  Number of attempts was 1.  Colorimetric CO2 detector was consistent with tracheal placement.   Complications/Tolerance None; patient tolerated the procedure well. Chest X-ray is ordered to verify placement.   EBL none   Specimen(s) None

## 2022-03-12 NOTE — Progress Notes (Addendum)
eLink Physician-Brief Progress Note Patient Name: Sheila Fernandez DOB: 1951/01/02 MRN: [id number]   Date of Service  03/12/2022  HPI/Events of Note  Hypotension - BP = 72/51 with MAP = 59 by cuff and HR = 57. Esmolol IV infusion stopped about 30 minutes ago.  eICU Interventions  Plan: 0.9 NaCl 1 liter IV over 1 hour now.  Phenylephrine IV infusion via PIV. Titrate to MAP >= 65. Send AM lab work (CBC, BMP and Mg++) STAT. Will request the PCCM ground team evaluate the patient at bedside.       Intervention Category Major Interventions: Hypotension - evaluation and management  Josanna Hefel Eugene 03/12/2022, 6:11 AM

## 2022-03-12 NOTE — Progress Notes (Signed)
An USGPIV (ultrasound guided PIV) has been placed to LAFA 22g 1.75"for short-term vasopressor infusion. A correctly placed ivWatch must be used when administering Vasopressors. Should this treatment be needed beyond 72 hours, central line access should be obtained.  It will be the responsibility of the bedside nurse to follow best practice to prevent extravasations.

## 2022-03-12 NOTE — Progress Notes (Signed)
Peripherally Inserted Central Catheter Placement  The IV Nurse has discussed with the patient and/or persons authorized to consent for the patient, the purpose of this procedure and the potential benefits and risks involved with this procedure.  The benefits include less needle sticks, lab draws from the catheter, and the patient may be discharged home with the catheter. Risks include, but not limited to, infection, bleeding, blood clot (thrombus formation), and puncture of an artery; nerve damage and irregular heartbeat and possibility to perform a PICC exchange if needed/ordered by physician.  Alternatives to this procedure were also discussed.  Bard Power PICC patient education guide, fact sheet on infection prevention and patient information card has been provided to patient /or left at bedside.  Daughter at bedside, is agreeable to PICC placement and signed consent.  PICC Placement Documentation  PICC Triple Lumen 03/12/22 Right Brachial 36 cm 1 cm (Active)  Indication for Insertion or Continuance of Line Vasoactive infusions 03/12/22 0653  Exposed Catheter (cm) 1 cm 03/12/22 0653  Site Assessment Clean, Dry, Intact 03/12/22 0653  Lumen #1 Status Flushed;Saline locked;Blood return noted 03/12/22 0653  Lumen #2 Status Flushed;Saline locked;Blood return noted 03/12/22 0653  Lumen #3 Status Flushed;Saline locked;Blood return noted 03/12/22 0653  Dressing Type Transparent;Securing device 03/12/22 0653  Dressing Status Antimicrobial disc in place 03/12/22 0653  Safety Lock Not Applicable 31/49/70 2637  Line Care Connections checked and tightened 03/12/22 0653  Line Adjustment (NICU/IV Team Only) No 03/12/22 0653  Dressing Intervention New dressing 03/12/22 0653  Dressing Change Due 03/19/22 03/12/22 [place] [employer] 03/12/2022, 5:54 PM

## 2022-03-12 NOTE — Progress Notes (Signed)
eLink Physician-Brief Progress Note Patient Name: Sheila Fernandez DOB: 09-12-1951 MRN: [id number]   Date of Service  03/12/2022  HPI/Events of Note  Nursing request for AM lab orders.   eICU Interventions  Plan: CBC with platelets, BMP and Mg++ level now.      Intervention Category Major Interventions: Other:  Lysle Dingwall 03/12/2022, 5:23 AM

## 2022-03-12 NOTE — Progress Notes (Signed)
RT note- Attempted to wean again, patient is awake and gagging on ETT. As soon as there is no stimulation she is apnic and or a very low VE. She was placed back to full support at this time.

## 2022-03-12 NOTE — Progress Notes (Signed)
Initial Nutrition Assessment  DOCUMENTATION CODES:   Severe malnutrition in context of acute illness/injury  INTERVENTION:   Initiate tube feeding via OG tube: Vital AF 1.2 at 20 ml/h and increase by 10 ml every 8 hours to goal rate of 60 ml/hr (1440 ml per day)  Provides 1728 kcal, 108 gm protein, 1166 ml free water daily  MVI with minerals daily  100 mg thiamine daily  Monitor magnesium and phosphorus every 12 hours x 4 occurrences, MD to replete as needed, as pt is at risk for refeeding syndrome given severe malnutrition.   Check vitamin labs: Vitamin B1 Vitamin B12 Vitamin C   NUTRITION DIAGNOSIS:   Severe Malnutrition related to acute illness as evidenced by severe muscle depletion, severe fat depletion.  GOAL:   Patient will meet greater than or equal to 90% of their needs  MONITOR:   I & O's  REASON FOR ASSESSMENT:   Ventilator, Malnutrition Screening Tool    ASSESSMENT:   Pt with PMH of HTN, orthostatic hypotension, anemia, arthritis, sickle cell trait, and recent weight loss and fatigue x 3 months, abd pain started on protonix now admitted with SOB and possible ascending aortic dissection.   Pt admitted with concern for aortic dissection, started on esmolol and developed hypotension and was intubated.   Per chart review CCM hopeful to extubate. Pt with OG tube in place, per xray coiled in stomach. Xray notes that pt has severe gastric distention.  Discussed with CCM NP, will start TF and slowly advance to goal.    Spoke with son and a daughter who is at bedside. Family reports that pt has been losing weight for the last 5 months and has continued to lose weight. They believe that she has lost 28 lb during this time based on her PCP visits. She has reported lack of appetite and early satiety and at times being unable to taste foods like tuna. She previously lived alone but moved in with another daughter because of issues with her BUE. Her hands were hurting  but she had been able to complete ADLs until 2 weeks ago then she became too SOB to bathe herself.  Per family pt had been getting worked up for weight loss but no dx received yet.   Medications reviewed and include: colace, protonix, miralax Precedex Levophed @ 7 mcg  Labs reviewed: K 3.4 CRP: 0.6  NUTRITION - FOCUSED PHYSICAL EXAM:  Flowsheet Row Most Recent Value  Orbital Region Moderate depletion  Upper Arm Region Severe depletion  Thoracic and Lumbar Region Severe depletion  Buccal Region Unable to assess  Temple Region Severe depletion  Clavicle Bone Region Severe depletion  Clavicle and Acromion Bone Region Severe depletion  Scapular Bone Region Severe depletion  Dorsal Hand Severe depletion  Patellar Region Moderate depletion  Anterior Thigh Region Moderate depletion  Posterior Calf Region Moderate depletion  Edema (RD Assessment) None  Hair Reviewed  Eyes Unable to assess  Mouth Unable to assess  Skin Reviewed  Nails Reviewed       Diet Order:   Diet Order             Diet NPO time specified  Diet effective now                   EDUCATION NEEDS:   Not appropriate for education at this time  Skin:  Skin Assessment: Reviewed RN Assessment  Last BM:  unknown  Height:   Ht Readings from Last 1 Encounters:  03/11/22 '5\' 1"'$  (1.549 m)    Weight:   Wt Readings from Last 1 Encounters:  03/12/22 49.8 kg    BMI:  Body mass index is 20.74 kg/m.  Estimated Nutritional Needs:   Kcal:  1600-1800  Protein:  80-100 grams  Fluid:  >1.6 L/day  Lockie Pares., RD, LDN, CNSC See AMiON for contact information

## 2022-03-12 NOTE — Progress Notes (Signed)
EEG complete - results pending 

## 2022-03-12 NOTE — Procedures (Signed)
Intubation Procedure Note  Sheila Fernandez  [id number]  Dec 22, 1950  Date:03/12/22  Time:7:19 AM   Provider Performing:Maxi Carreras, Morey Hummingbird    Procedure: Intubation (31500)  Indication(s) Respiratory Failure  Consent Unable to obtain consent due to emergent nature of procedure.   Anesthesia     Time Out Verified patient identification, verified procedure, site/side was marked, verified correct patient position, special equipment/implants available, medications/allergies/relevant history reviewed, required imaging and test results available.   Sterile Technique Usual hand hygeine, masks, and gloves were used   Procedure Description Patient positioned in bed supine.  Sedation given as noted above.  Patient was intubated with endotracheal tube using Glidescope.  View was Grade 1 full glottis .  Number of attempts was 1.  Colorimetric CO2 detector was consistent with tracheal placement.   Complications/Tolerance None; patient tolerated the procedure well. Chest X-ray is ordered to verify placement.   EBL Minimal   Specimen(s) None

## 2022-03-12 NOTE — Progress Notes (Addendum)
At bedside at 0530 to obtain IV for CTA scheduled for today. Pt apparently sleeping; q15 cuff BP being monitored because art line was not functional despite interventions. At 0545, cuff BP dropped from 101/65 (77) to 76/54 (62), HR 56. Pt was unarousable. Esmolol drip stopped, remainder aspirated from IV. Called eICU; ground team arrived shortly thereafter.   500 ml NS bolus given Phenylephrine gtt started  0630 2 amps bicarb  0644 Etomidate/roc/versed, intubated, +BBS  Daughter Noni called, updated on events.

## 2022-03-12 NOTE — Care Management (Signed)
  Transition of Care (TOC) Screening Note   Patient Details  Name: Sheila Fernandez Date of Birth: [birth year]   Transition of Care [facility]) CM/SW Contact:    Bethena Roys, RN Phone Number: 03/12/2022, 12:04 PM    Transition of Care Department [facility]) has reviewed the patient. Patient presented for chest and abdominal pain with hypertension. Patient is currently intubated for obtundation and hypotension. Case Manager will continue to follow for disposition needs as the patient progresses.

## 2022-03-12 NOTE — Progress Notes (Signed)
RT note- Patient taken to CT on ventilator, no issues, continue to monitor.

## 2022-03-12 NOTE — Progress Notes (Signed)
RT note-RR decreased prior to ABG, and fio2 decreased post ABG.

## 2022-03-12 NOTE — Progress Notes (Signed)
NAME:  Sheila Fernandez, MRN:  [id number], DOB:  [birth year], LOS: 1 ADMISSION DATE:  03/11/2022, CONSULTATION DATE:  03/11/22 REFERRING MD:  Darl Householder, CHIEF COMPLAINT:  Chest pain, abdominal pain  History of Present Illness:  71 yo woman with chest pain, abdominal pain, SOB. Sudden onset epigastric pain radiating to back since yesterday.  SOB.  BP elevated always, missed her bp meds this am.  CT chest several months ago, mass vs atelectasis.  Here with mid abdominal pain since yesterday.  Low back pain.  SOB, fatigue x 3 months Fatigue and weight loss x 6 months.   Recent abdominal pain, started on protonix   lipase: 21 Bili 1.3 ap normal Trop 20  Pertinent  Medical History  HTN Orthostatic hypotension  Anemia Arthritis Iritis, Recurrent Sickle cell trait  Norvasc 2.5, cored 25 BID, vit d  Xyzal '5mg'$  daily Losartan '100mg'$  daily Remeron 7.5 mg dialy Protonix '40mg'$  Daily  Prednisolone 1% 2 drops into the L eye QID     PCV ECHOCARDIOGRAM COMPLETE 12/10/2021  Echocardiogram 12/10/2021: Left ventricle cavity is normal in size and wall thickness. Normal global wall motion. Normal LV systolic function with EF 61%. Doppler evidence of grade I (impaired) diastolic dysfunction, normal LAP. Mild mitral valve stenosis. Mild tricuspid regurgitation. IVC not seen. No significant change compared to previous study in 2019.  PCV MYOCARDIAL PERFUSION WITH LEXISCAN 01/29/2022 Lexiscan Tetrofosmin stress test 02/01/2022: Lexiscan nuclear stress test performed using 1-day protocol. Normal myocardial perfusion. Stress LVEF 57%. Low risk study.  Significant Hospital Events: Including procedures, antibiotic start and stop dates in addition to other pertinent events   6/13 admitted to ICU for esmolol gtt with question of possible dissection 6/14 hypotensive, AMS on esmolol 300. Intubated. Starting NE.   Interim History / Subjective:  Intubated and started on NE   Objective   Blood pressure  108/71, pulse (!) 55, temperature 98.6 F (37 C), temperature source Oral, resp. rate 16, height '5\' 1"'$  (1.549 m), weight 49.8 kg, SpO2 99 %.    Vent Mode: PRVC FiO2 (%):  [50 %] 50 % Set Rate:  [16 bmp] 16 bmp Vt Set:  [380 mL] 380 mL PEEP:  [5 cmH20] 5 cmH20 Plateau Pressure:  [21 cmH20] 21 cmH20   Intake/Output Summary (Last 24 hours) at 03/12/2022 0816 Last data filed at 03/12/2022 0617 Gross per 24 hour  Intake 231.11 ml  Output 300 ml  Net -68.89 ml   Filed Weights   03/11/22 1200 03/11/22 2300 03/12/22 0500  Weight: 51.7 kg 49.8 kg 49.8 kg    Examination: General: ill appearing older adult F intubated NAD HENT: NCAT pink mm anicteric sclera  Lungs: RUL rhonchi otherwise clear. Mechanically ventilated  Cardiovascular: rrr s1s2 no rgm  Abdomen: soft ndnt + bowel sounds  Extremities: no acute joint deformity no cyanosis or clubbing  Neuro:  Awakens to stimuli. PERRL 19m. Follows lower extremity commands, weak. Sticks ou tongue to command. Indeterminate BUE commands. Fasciculations  GU: no foley  Resolved Hospital Problem list     Assessment & Plan:    Acute metabolic encephalopathy -- improving  -CT H 6/14 without acute abnormality  -hypercarbic pre-intubation, at the moment still sedate from intubation meds. ?hypoperfusion related to hypotension  P -RASS goal 0 to -1 -- PRN fent + precedex  -delirium precautions -on eeg  -check thyroid labs  Acute respiratory failure with hypercarbia - improved hypercarbia P -wean MV as able -WUA/SBT when appropriate   Hypotension  ?Hx pots  -  wonder if acute hypotension was iatrogenic related to anit hypertensives for ? AAS -not really in shock  P -dc anti hypertensives  -art line not functioning well (very positioning, not drawing back) and does correlate to NIBP. Will dc art line -peripheral pressors for MAP > 65   Possible AAS -- ruled out -repeat CTA without evidence of acute aortic syndrome -was on high dose  esmolol for this initially, ? If this contributed to abrupt hypotension and change in mental status  -dc hemodynamic goals for possible dissection   Cervical radiculopathy  -recent EMG -- Acute on chronic C8 radiculopathy (severe), chornic C5 radiculopathy (mild-mod) -- affecting BUE  Carpal tunnel -sounds like has pretty severe Bue weakness -- will impact neuro assessment when more awake -outpt follow up   Anemia  Hx sickle cell trait  -outpt iron studies ( or inpt if remains inpt several days)  Hypokalemia  -replace     Best Practice (right click and "Reselect all SmartList Selections" daily)   Diet/type: NPO DVT prophylaxis: SCD GI prophylaxis: PPI Lines: N/A Foley:  N/A Code Status:  full code Last date of multidisciplinary goals of care discussion [ 6/14 -- daughters RN RT]  Labs   CBC: Recent Labs  Lab 03/11/22 1229 03/12/22 0052 03/12/22 0633  WBC 4.4  --  4.3  NEUTROABS  --   --  3.3  HGB 11.3* 13.6 9.5*  HCT 36.3 40.0 29.9*  MCV 88.5  --  89.3  PLT 226  --  202     Basic Metabolic Panel: Recent Labs  Lab 03/11/22 1229 03/12/22 0052 03/12/22 0633  NA 143 141 145  K 3.4* 3.4* 3.3*  CL 100  --  98  CO2 35*  --  41*  GLUCOSE 120*  --  158*  BUN 12  --  14  CREATININE 0.64  --  0.81  CALCIUM 10.0  --  8.9  MG  --   --  1.9    GFR: Estimated Creatinine Clearance: 48.1 mL/min (by C-G formula based on SCr of 0.81 mg/dL). Recent Labs  Lab 03/11/22 1229 03/12/22 0030 03/12/22 0633 03/12/22 0708  WBC 4.4  --  4.3  --   LATICACIDVEN  --  0.9  --  1.3     Liver Function Tests: Recent Labs  Lab 03/11/22 1229  AST 17  ALT 18  ALKPHOS 58  BILITOT 1.3*  PROT 7.7  ALBUMIN 4.0    Recent Labs  Lab 03/11/22 1229  LIPASE 21    No results for input(s): "AMMONIA" in the last 168 hours.  ABG    Component Value Date/Time   PHART 7.416 03/12/2022 0052   PCO2ART 56.5 (H) 03/12/2022 0052   PO2ART 67 (L) 03/12/2022 0052   HCO3 47.7 (H)  03/12/2022 0645   TCO2 38 (H) 03/12/2022 0052   O2SAT 97.8 03/12/2022 0645     Coagulation Profile: No results for input(s): "INR", "PROTIME" in the last 168 hours.  Cardiac Enzymes: No results for input(s): "CKTOTAL", "CKMB", "CKMBINDEX", "TROPONINI" in the last 168 hours.  HbA1C: Hemoglobin A1C  Date/Time Value Ref Range Status  04/16/2015 04:25 PM 5.5  Final   Hgb A1c MFr Bld  Date/Time Value Ref Range Status  09/06/2013 02:25 AM 5.5 <5.7 % Final    Comment:    (NOTE)  According to the ADA Clinical Practice Recommendations for 2011, when HbA1c is used as a screening test:  >=6.5%   Diagnostic of Diabetes Mellitus           (if abnormal result is confirmed) 5.7-6.4%   Increased risk of developing Diabetes Mellitus References:Diagnosis and Classification of Diabetes Mellitus,Diabetes QSXQ,8208,13(GITJL 1):S62-S69 and Standards of Medical Care in         Diabetes - 2011,Diabetes LVDI,7185,50 (Suppl 1):S11-S61.    CBG: No results for input(s): "GLUCAP" in the last 168 hours.   CRITICAL CARE Performed by: Cristal Generous   Total critical care time: 52 minutes  Critical care time was exclusive of separately billable procedures and treating other patients. Critical care was necessary to treat or prevent imminent or life-threatening deterioration.  Critical care was time spent personally by me on the following activities: development of treatment plan with patient and/or surrogate as well as nursing, discussions with consultants, evaluation of patient's response to treatment, examination of patient, obtaining history from patient or surrogate, ordering and performing treatments and interventions, ordering and review of laboratory studies, ordering and review of radiographic studies, pulse oximetry and re-evaluation of patient's condition.  Eliseo Gum MSN, AGACNP-BC [place] for pager  03/12/2022, 9:29 AM

## 2022-03-12 NOTE — Procedures (Signed)
Bronchoscopy Procedure Note  Sheila Fernandez  [id number]  03/18/1951  Date:03/12/22  Time:11:40 AM   Provider Performing:Akina Maish C Tamala Julian   Procedure(s):  Flexible bronchoscopy with bronchial alveolar lavage (51834)  Indication(s) Persistent RML atelectasis  Consent Obtained from family at bedside  Anesthesia Fent 48mg x 1   Time Out Verified patient identification, verified procedure, site/side was marked, verified correct patient position, special equipment/implants available, medications/allergies/relevant history reviewed, required imaging and test results available.   Sterile Technique Usual hand hygiene, masks, gowns, and gloves were used   Procedure Description Bronchoscope advanced through endotracheal tube and into airway.  Airways were examined down to subsegmental level with findings noted below.   Following diagnostic evaluation, BAL(s) performed in RML with normal saline and return of clear fluid  Findings:  Mild bronchial pitting No endobronchial obstruction  Complications/Tolerance None; patient tolerated the procedure well. Chest X-ray is not needed post procedure.   EBL None   Specimen(s) RML BAL

## 2022-03-13 ENCOUNTER — Inpatient Hospital Stay (HOSPITAL_COMMUNITY): Payer: Medicare Other

## 2022-03-13 DIAGNOSIS — R0689 Other abnormalities of breathing: Secondary | ICD-10-CM

## 2022-03-13 DIAGNOSIS — R569 Unspecified convulsions: Secondary | ICD-10-CM | POA: Diagnosis not present

## 2022-03-13 DIAGNOSIS — I71019 Dissection of thoracic aorta, unspecified: Secondary | ICD-10-CM | POA: Diagnosis not present

## 2022-03-13 DIAGNOSIS — G9340 Encephalopathy, unspecified: Secondary | ICD-10-CM

## 2022-03-13 LAB — POCT I-STAT, CHEM 8
BUN: 24 mg/dL — ABNORMAL HIGH (ref 8–23)
Calcium, Ion: 1.27 mmol/L (ref 1.15–1.40)
Chloride: 103 mmol/L (ref 98–111)
Creatinine, Ser: 1.3 mg/dL — ABNORMAL HIGH (ref 0.44–1.00)
Glucose, Bld: 138 mg/dL — ABNORMAL HIGH (ref 70–99)
HCT: 25 % — ABNORMAL LOW (ref 36.0–46.0)
Hemoglobin: 8.5 g/dL — ABNORMAL LOW (ref 12.0–15.0)
Potassium: 3 mmol/L — ABNORMAL LOW (ref 3.5–5.1)
Sodium: 145 mmol/L (ref 135–145)
TCO2: 32 mmol/L (ref 22–32)

## 2022-03-13 LAB — PHOSPHORUS: Phosphorus: 3.2 mg/dL (ref 2.5–4.6)

## 2022-03-13 LAB — COMPREHENSIVE METABOLIC PANEL
ALT: 16 U/L (ref 0–44)
AST: 38 U/L (ref 15–41)
Albumin: 2.7 g/dL — ABNORMAL LOW (ref 3.5–5.0)
Alkaline Phosphatase: 41 U/L (ref 38–126)
Anion gap: 8 (ref 5–15)
BUN: 25 mg/dL — ABNORMAL HIGH (ref 8–23)
CO2: 30 mmol/L (ref 22–32)
Calcium: 8.9 mg/dL (ref 8.9–10.3)
Chloride: 101 mmol/L (ref 98–111)
Creatinine, Ser: 1.29 mg/dL — ABNORMAL HIGH (ref 0.44–1.00)
GFR, Estimated: 44 mL/min — ABNORMAL LOW (ref >60)
Glucose, Bld: 210 mg/dL — ABNORMAL HIGH (ref 70–99)
Potassium: 4.6 mmol/L (ref 3.5–5.1)
Sodium: 139 mmol/L (ref 135–145)
Total Bilirubin: 1.7 mg/dL — ABNORMAL HIGH (ref 0.3–1.2)
Total Protein: 4.6 g/dL — ABNORMAL LOW (ref 6.5–8.1)

## 2022-03-13 LAB — POCT I-STAT 7, (LYTES, BLD GAS, ICA,H+H)
Acid-Base Excess: 6 mmol/L — ABNORMAL HIGH (ref 0.0–2.0)
Bicarbonate: 31 mmol/L — ABNORMAL HIGH (ref 20.0–28.0)
Calcium, Ion: 1.26 mmol/L (ref 1.15–1.40)
HCT: 28 % — ABNORMAL LOW (ref 36.0–46.0)
Hemoglobin: 9.5 g/dL — ABNORMAL LOW (ref 12.0–15.0)
O2 Saturation: 99 %
Patient temperature: 97.6
Potassium: 3.1 mmol/L — ABNORMAL LOW (ref 3.5–5.1)
Sodium: 144 mmol/L (ref 135–145)
TCO2: 33 mmol/L — ABNORMAL HIGH (ref 22–32)
pCO2 arterial: 48 mmHg (ref 32–48)
pH, Arterial: 7.417 (ref 7.35–7.45)
pO2, Arterial: 123 mmHg — ABNORMAL HIGH (ref 83–108)

## 2022-03-13 LAB — BASIC METABOLIC PANEL
Anion gap: 11 (ref 5–15)
BUN: 24 mg/dL — ABNORMAL HIGH (ref 8–23)
CO2: 31 mmol/L (ref 22–32)
Calcium: 9.5 mg/dL (ref 8.9–10.3)
Chloride: 103 mmol/L (ref 98–111)
Creatinine, Ser: 1.38 mg/dL — ABNORMAL HIGH (ref 0.44–1.00)
GFR, Estimated: 41 mL/min — ABNORMAL LOW (ref >60)
Glucose, Bld: 140 mg/dL — ABNORMAL HIGH (ref 70–99)
Potassium: 3.3 mmol/L — ABNORMAL LOW (ref 3.5–5.1)
Sodium: 145 mmol/L (ref 135–145)

## 2022-03-13 LAB — ACID FAST SMEAR (AFB, MYCOBACTERIA): Acid Fast Smear: NEGATIVE

## 2022-03-13 LAB — LACTIC ACID, PLASMA: Lactic Acid, Venous: 1 mmol/L (ref 0.5–1.9)

## 2022-03-13 LAB — T3, FREE: T3, Free: 1.8 pg/mL — ABNORMAL LOW (ref 2.0–4.4)

## 2022-03-13 LAB — GLUCOSE, CAPILLARY
Glucose-Capillary: 144 mg/dL — ABNORMAL HIGH (ref 70–99)
Glucose-Capillary: 123 mg/dL — ABNORMAL HIGH (ref 70–99)
Glucose-Capillary: 115 mg/dL — ABNORMAL HIGH (ref 70–99)
Glucose-Capillary: 176 mg/dL — ABNORMAL HIGH (ref 70–99)
Glucose-Capillary: 199 mg/dL — ABNORMAL HIGH (ref 70–99)
Glucose-Capillary: 207 mg/dL — ABNORMAL HIGH (ref 70–99)

## 2022-03-13 LAB — MAGNESIUM: Magnesium: 2.3 mg/dL (ref 1.7–2.4)

## 2022-03-13 LAB — CEA: CEA: 1.4 ng/mL (ref 0.0–4.7)

## 2022-03-13 LAB — ANA W/REFLEX IF POSITIVE: Anti Nuclear Antibody (ANA): NEGATIVE

## 2022-03-13 MED ORDER — THIAMINE HCL 100 MG PO TABS: 100.0000 mg | ORAL_TABLET | Freq: Every day | ORAL | Status: DC

## 2022-03-13 MED ORDER — LACTATED RINGERS IV BOLUS
1000.0000 mL | Freq: Once | INTRAVENOUS | Status: AC
  Administered 2022-03-13: 1000 mL via INTRAVENOUS

## 2022-03-13 MED ORDER — DOCUSATE SODIUM 100 MG PO CAPS: 100.0000 mg | ORAL_CAPSULE | Freq: Two times a day (BID) | ORAL | Status: DC

## 2022-03-13 MED ORDER — NOREPINEPHRINE 4 MG/250ML-% IV SOLN
INTRAVENOUS | Status: AC
  Administered 2022-03-13: 2 ug/min via INTRAVENOUS
  Filled 2022-03-13: qty 250

## 2022-03-13 MED ORDER — NOREPINEPHRINE 4 MG/250ML-% IV SOLN
0.0000 ug/min | INTRAVENOUS | Status: DC
  Administered 2022-03-14: 1 ug/min via INTRAVENOUS
  Administered 2022-03-15 – 2022-03-16 (×2): 2 ug/min via INTRAVENOUS
  Administered 2022-03-17: 10 ug/min via INTRAVENOUS
  Administered 2022-03-19: 4 ug/min via INTRAVENOUS
  Filled 2022-03-13 (×5): qty 250

## 2022-03-13 MED ORDER — MIDAZOLAM HCL 2 MG/2ML IJ SOLN
INTRAMUSCULAR | Status: AC
  Administered 2022-03-13: 4 mg
  Filled 2022-03-13: qty 2

## 2022-03-13 MED ORDER — ROCURONIUM BROMIDE 10 MG/ML (PF) SYRINGE
PREFILLED_SYRINGE | INTRAVENOUS | Status: AC
  Filled 2022-03-13: qty 10

## 2022-03-13 MED ORDER — ETOMIDATE 2 MG/ML IV SOLN
INTRAVENOUS | Status: AC
  Administered 2022-03-13: 20 mg
  Filled 2022-03-13: qty 20

## 2022-03-13 MED ORDER — MIDAZOLAM HCL 2 MG/2ML IJ SOLN
INTRAMUSCULAR | Status: AC
  Filled 2022-03-13: qty 2

## 2022-03-13 MED ORDER — PROPOFOL 1000 MG/100ML IV EMUL
0.0000 ug/kg/min | INTRAVENOUS | Status: DC
  Administered 2022-03-13: 5 ug/kg/min via INTRAVENOUS
  Filled 2022-03-13 (×3): qty 100

## 2022-03-13 MED ORDER — SUCCINYLCHOLINE CHLORIDE 200 MG/10ML IV SOSY
PREFILLED_SYRINGE | INTRAVENOUS | Status: AC
  Filled 2022-03-13: qty 10

## 2022-03-13 MED ORDER — KETAMINE HCL 50 MG/5ML IJ SOSY
PREFILLED_SYRINGE | INTRAMUSCULAR | Status: AC
  Filled 2022-03-13: qty 5

## 2022-03-13 MED ORDER — FENTANYL CITRATE PF 50 MCG/ML IJ SOSY
PREFILLED_SYRINGE | INTRAMUSCULAR | Status: AC
  Filled 2022-03-13: qty 2

## 2022-03-13 MED ORDER — ORAL CARE MOUTH RINSE: 15.0000 mL | OROMUCOSAL | Status: DC | PRN

## 2022-03-13 MED ORDER — ROCURONIUM BROMIDE 10 MG/ML (PF) SYRINGE
PREFILLED_SYRINGE | INTRAVENOUS | Status: AC
  Administered 2022-03-13: 50 mg
  Filled 2022-03-13: qty 10

## 2022-03-13 MED ORDER — POTASSIUM CHLORIDE CRYS ER 20 MEQ PO TBCR
40.0000 meq | EXTENDED_RELEASE_TABLET | Freq: Once | ORAL | Status: AC
  Administered 2022-03-13: 40 meq via ORAL
  Filled 2022-03-13: qty 2

## 2022-03-13 MED ORDER — SODIUM CHLORIDE 0.9 % IV SOLN
3000.0000 mg | Freq: Once | INTRAVENOUS | Status: AC
  Administered 2022-03-13: 3000 mg via INTRAVENOUS
  Filled 2022-03-13: qty 30

## 2022-03-13 MED ORDER — ETOMIDATE 2 MG/ML IV SOLN
INTRAVENOUS | Status: AC
  Filled 2022-03-13: qty 20

## 2022-03-13 MED ORDER — POLYETHYLENE GLYCOL 3350 17 G PO PACK: 17.0000 g | PACK | Freq: Every day | ORAL | Status: DC

## 2022-03-13 MED ORDER — ADULT MULTIVITAMIN W/MINERALS CH: 1.0000 | ORAL_TABLET | Freq: Every day | ORAL | Status: DC

## 2022-03-13 MED ORDER — PANTOPRAZOLE SODIUM 40 MG PO TBEC: 40.0000 mg | DELAYED_RELEASE_TABLET | Freq: Every day | ORAL | Status: DC

## 2022-03-13 MED ORDER — PHENYLEPHRINE 80 MCG/ML (10ML) SYRINGE FOR IV PUSH (FOR BLOOD PRESSURE SUPPORT)
PREFILLED_SYRINGE | INTRAVENOUS | Status: AC
  Administered 2022-03-13: 400 ug
  Filled 2022-03-13: qty 10

## 2022-03-13 MED ORDER — LORAZEPAM 2 MG/ML IJ SOLN
2.0000 mg | INTRAMUSCULAR | Status: DC | PRN
Start: 2022-03-13 — End: 2022-03-23
  Administered 2022-03-17 – 2022-03-22 (×2): 2 mg via INTRAVENOUS
  Filled 2022-03-13 (×2): qty 1

## 2022-03-13 NOTE — Progress Notes (Signed)
eLink Physician-Brief Progress Note Patient Name: Sheila Fernandez DOB: October 27, 1950 MRN: [id number]   Date of Service  03/13/2022  HPI/Events of Note  Patient with inconsistent K+ results.  eICU Interventions  Stat BMP ordered to resolve inconsistency.        Frederik Pear 03/13/2022, 6:43 AM

## 2022-03-13 NOTE — Code Documentation (Signed)
  Patient Name: KRISTEENA MEINEKE   MRN: [id number]   Date of Birth/ Sex: [birth year] , female      Admission Date: 03/11/2022  Attending Provider: Candee Furbish, MD  Primary Diagnosis: Dissection of aorta [facility])   Indication: Pt was in her usual state of health until this PM, when she was noted to be bradycardic with decreased responsiveness with concern for seizure. Code blue was subsequently called. At the time of arrival on scene, ACLS protocol was underway.   Technical Description:  - CPR performance duration:  0 minutes  - Was defibrillation or cardioversion used? No   - Was external pacer placed? Yes  - Was patient intubated pre/post CPR? No   Medications Administered: Y = Yes; Blank = No Amiodarone    Atropine    Calcium    Epinephrine    Lidocaine    Magnesium    Norepinephrine    Phenylephrine    Sodium bicarbonate    Vasopressin     Post CPR evaluation:  - Final Status - Was patient successfully resuscitated ? Yes - What is current rhythm? Sinus rhythm - What is current hemodynamic status? stable  Miscellaneous Information:  - Labs sent, including: CBC, CMP, ABG, lactic acid, troponin  - Primary team notified?  Yes  - Family Notified? Yes  - Additional notes/ transfer status: Transferred to the ICU for airway concerns      Scarlett Presto, MD  03/13/2022, 9:26 PM

## 2022-03-13 NOTE — Progress Notes (Addendum)
NAME:  Sheila Fernandez, MRN:  [id number], DOB:  Feb 08, 1951, LOS: 2 ADMISSION DATE:  03/11/2022, CONSULTATION DATE:  03/11/22 REFERRING MD:  Darl Householder, CHIEF COMPLAINT:  Chest pain, abdominal pain  History of Present Illness:  71 yo woman with chest pain, abdominal pain, SOB. Sudden onset epigastric pain radiating to back since yesterday.  SOB.  BP elevated always, missed her bp meds this am.  CT chest several months ago, mass vs atelectasis.  Here with mid abdominal pain since yesterday.  Low back pain.  SOB, fatigue x 3 months Fatigue and weight loss x 6 months.   Recent abdominal pain, started on protonix   lipase: 21 Bili 1.3 ap normal Trop 20  Pertinent  Medical History  HTN Orthostatic hypotension  Anemia Arthritis Iritis, Recurrent Sickle cell trait  Norvasc 2.5, cored 25 BID, vit d  Xyzal '5mg'$  daily Losartan '100mg'$  daily Remeron 7.5 mg dialy Protonix '40mg'$  Daily  Prednisolone 1% 2 drops into the L eye QID     PCV ECHOCARDIOGRAM COMPLETE 12/10/2021  Echocardiogram 12/10/2021: Left ventricle cavity is normal in size and wall thickness. Normal global wall motion. Normal LV systolic function with EF 61%. Doppler evidence of grade I (impaired) diastolic dysfunction, normal LAP. Mild mitral valve stenosis. Mild tricuspid regurgitation. IVC not seen. No significant change compared to previous study in 2019.  PCV MYOCARDIAL PERFUSION WITH LEXISCAN 01/29/2022 Lexiscan Tetrofosmin stress test 02/01/2022: Lexiscan nuclear stress test performed using 1-day protocol. Normal myocardial perfusion. Stress LVEF 57%. Low risk study.  Significant Hospital Events: Including procedures, antibiotic start and stop dates in addition to other pertinent events   6/13 admitted to ICU for esmolol gtt with question of possible dissection 6/14 hypotensive, AMS on esmolol 300. Intubated. Starting NE. CT H neg. CTA no AAS Extubate   Interim History / Subjective:   On low dose NE Cr bump to  1.38   Weaning well on vent   Objective   Blood pressure 127/85, pulse 82, temperature 98.5 F (36.9 C), temperature source Oral, resp. rate 18, height '5\' 1"'$  (1.549 m), weight 54.9 kg, SpO2 97 %.    Vent Mode: PSV;CPAP FiO2 (%):  [40 %-50 %] 40 % Set Rate:  [12 bmp] 12 bmp Vt Set:  [380 mL] 380 mL PEEP:  [5 cmH20] 5 cmH20 Pressure Support:  [10 cmH20] 10 cmH20 Plateau Pressure:  [14 cmH20-22 cmH20] 14 cmH20   Intake/Output Summary (Last 24 hours) at 03/13/2022 0940 Last data filed at 03/13/2022 0700 Gross per 24 hour  Intake 3771.63 ml  Output 1320 ml  Net 2451.63 ml   Filed Weights   03/11/22 2300 03/12/22 0500 03/13/22 0500  Weight: 49.8 kg 49.8 kg 54.9 kg    Examination: General: ill appearing older adult F intubated NAD HENT: NCAT pink mm anicteric sclera  Lungs: RUL rhonchi otherwise clear. Mechanically ventilated  Cardiovascular: rrr s1s2 no rgm  Abdomen: soft ndnt + bowel sounds  Extremities: no acute joint deformity no cyanosis or clubbing  Neuro:  Awakens to stimuli. PERRL 23m. Follows lower extremity commands, weak. Sticks ou tongue to command. Indeterminate BUE commands. Fasciculations  GU: no foley  Resolved Hospital Problem list     Assessment & Plan:    Acute metabolic encephalopathy  -CT H 6/14 without acute abnormality  -hypercarbic pre-intubation, at the moment still sedate from intubation meds. ?hypoperfusion related to hypotension -eeg no seizure   P -delirium precautions   Acute respiratory failure with hypercarbia, improving  -? If she has an  underlying central sleep apnea based on patterns on vent  P -extubate  -pulm hygiene   Hypotension  ?Hx pots + hypovolemia + iatrogenic r/t esmolol  -AAS rule out on repeat CTA  P -NE for MAP goal > 65 -add'l bolus IVF   AKI- contrast nephropathy + hypoperfusion from hypotension P -IVF, trend renal indices UOP -eval for foley dc post extubation   Cervical radiculopathy -recent EMG -- Acute  on chronic C8 radiculopathy (severe), chornic C5 radiculopathy (mild-mod) -- affecting BUE  Carpal tunnel -sounds like has pretty severe BUE weakness chronically. ? If there could be a polyneuropathy or other underlying process  P -outpt follow up   Anemia  Hx sickle cell trait  -outpt iron studies  Hypokalemia  -replace   Gastric pain Cough DOE Chest pain Weakness -need a better hx from patient post-extubation to understand ED presentation. From family, sx sound like they could fall under an AI/rheum/neuro umbrella but so vague at this point it is hard to decipher. Very likely to be best worked up [employer], but certainly obtaining better hx will help determine this -rheum labs pending -sending tick borne illness panels -- pt c/o bug bite on neck preceding sx    Best Practice (right click and "Reselect all SmartList Selections" daily)   Diet/type: NPO DVT prophylaxis: SCD GI prophylaxis: PPI Lines: Central line Foley:  Yes, and it is still needed Code Status:  full code Last date of multidisciplinary goals of care discussion [ 6/15 Hinton Dyer and Noni on phone]  Labs   CBC: Recent Labs  Lab 03/11/22 1229 03/12/22 0052 03/12/22 0633 03/12/22 0732 03/12/22 1116 03/13/22 0613  WBC 4.4  --  4.3  --   --   --   NEUTROABS  --   --  3.3  --   --   --   HGB 11.3* 13.6 9.5* 9.2* 9.9* 8.5*  HCT 36.3 40.0 29.9* 27.0* 29.0* 25.0*  MCV 88.5  --  89.3  --   --   --   PLT 226  --  202  --   --   --     Basic Metabolic Panel: Recent Labs  Lab 03/11/22 1229 03/12/22 0052 03/12/22 0633 03/12/22 0732 03/12/22 1116 03/12/22 1148 03/13/22 0411 03/13/22 0613 03/13/22 0654  NA 143   < > 145 145 142  --  139 145 145  K 3.4*   < > 3.3* 3.3* 3.4*  --  4.6 3.0* 3.3*  CL 100  --  98  --   --   --  101 103 103  CO2 35*  --  41*  --   --   --  30  --  31  GLUCOSE 120*  --  158*  --   --   --  210* 138* 140*  BUN 12  --  14  --   --   --  25* 24* 24*  CREATININE 0.64  --  0.81  --   --    --  1.29* 1.30* 1.38*  CALCIUM 10.0  --  8.9  --   --   --  8.9  --  9.5  MG  --   --  1.9  --   --   --  2.3  --   --   PHOS  --   --   --   --   --  2.4* 3.2  --   --    < > = values in  this interval not displayed.   GFR: Estimated Creatinine Clearance: 28.2 mL/min (A) (by C-G formula based on SCr of 1.38 mg/dL (H)). Recent Labs  Lab 03/11/22 1229 03/12/22 0030 03/12/22 0633 03/12/22 0708 03/12/22 1102  WBC 4.4  --  4.3  --   --   LATICACIDVEN  --  0.9  --  1.3 2.6*    Liver Function Tests: Recent Labs  Lab 03/11/22 1229 03/13/22 0411  AST 17 38  ALT 18 16  ALKPHOS 58 41  BILITOT 1.3* 1.7*  PROT 7.7 4.6*  ALBUMIN 4.0 2.7*   Recent Labs  Lab 03/11/22 1229  LIPASE 21   No results for input(s): "AMMONIA" in the last 168 hours.  ABG    Component Value Date/Time   PHART 7.535 (H) 03/12/2022 1116   PCO2ART 39.1 03/12/2022 1116   PO2ART 188 (H) 03/12/2022 1116   HCO3 33.1 (H) 03/12/2022 1116   TCO2 32 03/13/2022 0613   O2SAT 100 03/12/2022 1116     Coagulation Profile: No results for input(s): "INR", "PROTIME" in the last 168 hours.  Cardiac Enzymes: Recent Labs  Lab 03/12/22 1102  CKTOTAL 34*    HbA1C: Hemoglobin A1C  Date/Time Value Ref Range Status  04/16/2015 04:25 PM 5.5  Final   Hgb A1c MFr Bld  Date/Time Value Ref Range Status  09/06/2013 02:25 AM 5.5 <5.7 % Final    Comment:    (NOTE)                                                                       According to the ADA Clinical Practice Recommendations for 2011, when HbA1c is used as a screening test:  >=6.5%   Diagnostic of Diabetes Mellitus           (if abnormal result is confirmed) 5.7-6.4%   Increased risk of developing Diabetes Mellitus References:Diagnosis and Classification of Diabetes Mellitus,Diabetes ZSMO,7078,67(JQGBE 1):S62-S69 and Standards of Medical Care in         Diabetes - 2011,Diabetes EFEO,7121,97 (Suppl 1):S11-S61.    CBG: Recent Labs  Lab 03/12/22 0633  03/12/22 1953 03/12/22 2305 03/13/22 0314 03/13/22 0740  GLUCAP 144* 83 148* 144* 123*     CRITICAL CARE Performed by: Cristal Generous   Total critical care time: 40 minutes  Critical care time was exclusive of separately billable procedures and treating other patients. Critical care was necessary to treat or prevent imminent or life-threatening deterioration.  Critical care was time spent personally by me on the following activities: development of treatment plan with patient and/or surrogate as well as nursing, discussions with consultants, evaluation of patient's response to treatment, examination of patient, obtaining history from patient or surrogate, ordering and performing treatments and interventions, ordering and review of laboratory studies, ordering and review of radiographic studies, pulse oximetry and re-evaluation of patient's condition.  Eliseo Gum MSN, AGACNP-BC [place] for pager  03/13/2022, 9:40 AM

## 2022-03-13 NOTE — Procedures (Signed)
Intubation Procedure Note  Sheila Fernandez  [id number]  06-12-1951  Date:03/13/22  Time:9:39 PM   Provider Performing:Alyviah Crandle L Lauren Aguayo    Procedure: Intubation (31500)  Indication(s) Respiratory Failure  Consent Unable to obtain consent due to emergent nature of procedure.  Anesthesia Etomidate, Versed, and Rocuronium   Time Out Verified patient identification, verified procedure, site/side was marked, verified correct patient position, special equipment/implants available, medications/allergies/relevant history reviewed, required imaging and test results available.   Sterile Technique Usual hand hygeine, masks, and gloves were used   Procedure Description Patient positioned in bed supine.  Sedation given as noted above.  Patient was intubated with endotracheal tube using Glidescope.  View was Grade 1 full glottis .  Number of attempts was 1.  Colorimetric CO2 detector was consistent with tracheal placement.   Complications/Tolerance None; patient tolerated the procedure well. Chest X-ray is ordered to verify placement.   EBL Minimal   Specimen(s) None  Garner Nash, DO [facility] 03/13/2022 9:40 PM

## 2022-03-13 NOTE — Progress Notes (Signed)
NAME:  Sheila Fernandez, MRN:  [id number], DOB:  02/03/1951, LOS: 2 ADMISSION DATE:  03/11/2022, CONSULTATION DATE:  03/11/22 REFERRING MD:  Darl Householder, CHIEF COMPLAINT:  Chest pain, abdominal pain  History of Present Illness:  71 yo woman with chest pain, abdominal pain, SOB. Sudden onset epigastric pain radiating to back since yesterday.  SOB.  BP elevated always, missed her bp meds this am.  CT chest several months ago, mass vs atelectasis.  Here with mid abdominal pain since yesterday.  Low back pain.  SOB, fatigue x 3 months Fatigue and weight loss x 6 months.  Recent abdominal pain, started on protonix    Admitted to ICU, CT abdomen/pelvis negative for AAS, initially on esmolol infusion then required Levophed for hypotension and intubation for encephalopathy. Spot EEG and head CT were negative.  She was extubated 6/14 and transferred out of the ICU.    Overnight on 6/15, pt was ambulating and conversing with family when she suddenly became restless and then altered and had an episode of bradycardia to the 40's.  A code blue was called, but pt never lost pulses, she did require bagging for a short time.  Her HR improved without medications and she was responding to some commands, but had upwardly deviated gaze and flexion of the R arm.  She was transferred to the ICU and re-intubated and loaded with Keppra   Pertinent  Medical History  HTN Orthostatic hypotension  Anemia Arthritis Iritis, Recurrent Sickle cell trait  Norvasc 2.5, cored 25 BID, vit d  Xyzal '5mg'$  daily Losartan '100mg'$  daily Remeron 7.5 mg dialy Protonix '40mg'$  Daily  Prednisolone 1% 2 drops into the L eye QID     PCV ECHOCARDIOGRAM COMPLETE 12/10/2021  Echocardiogram 12/10/2021: Left ventricle cavity is normal in size and wall thickness. Normal global wall motion. Normal LV systolic function with EF 61%. Doppler evidence of grade I (impaired) diastolic dysfunction, normal LAP. Mild mitral valve stenosis. Mild  tricuspid regurgitation. IVC not seen. No significant change compared to previous study in 2019.  PCV MYOCARDIAL PERFUSION WITH LEXISCAN 01/29/2022 Lexiscan Tetrofosmin stress test 02/01/2022: Lexiscan nuclear stress test performed using 1-day protocol. Normal myocardial perfusion. Stress LVEF 57%. Low risk study.  Significant Hospital Events: Including procedures, antibiotic start and stop dates in addition to other pertinent events   6/13 admitted to ICU for esmolol gtt with question of possible dissection 6/14 hypotensive, AMS on esmolol 300. Intubated. Starting NE. CT H neg. CTA no AAS Extubate 6/15 Episode of AMS and bradycardia, concern for sz, re-intubated and loaded with Keppra  Interim History / Subjective:   Overnight on 6/15, pt was ambulating and conversing with family when she suddenly became restless and then altered and had an episode of bradycardia to the 40's.  A code blue was called, but pt never lost pulses, she did require bagging for a short time.  Her HR improved without medications and she was responding to some commands, but had upwardly deviated gaze and flexion of the R arm.  She was transferred to the ICU and re-intubated and loaded with Keppra  Objective   Blood pressure (!) 163/86, pulse 98, temperature 98.3 F (36.8 C), temperature source Oral, resp. rate 20, height '5\' 1"'$  (1.549 m), weight 54.9 kg, SpO2 96 %.    Vent Mode: PSV;CPAP FiO2 (%):  [40 %] 40 % Set Rate:  [12 bmp] 12 bmp Vt Set:  [380 mL] 380 mL PEEP:  [5 cmH20] 5 cmH20 Pressure Support:  [10 cmH20]  [place] Pressure:  [14 cmH20] 14 cmH20   Intake/Output Summary (Last 24 hours) at 03/13/2022 2122 Last data filed at 03/13/2022 2004 Gross per 24 hour  Intake 986.48 ml  Output 1600 ml  Net -613.52 ml    Filed Weights   03/11/22 2300 03/12/22 0500 03/13/22 0500  Weight: 49.8 kg 49.8 kg 54.9 kg    General:  thin, elderly F, altered HEENT: MM pink/dry, sclera anciteric Neuro:  rhythmic twitching of the R hand and then flexion with upwardly deviated gaze, intermittently follows basic commands then does not respond CV: s1s2 rrr, no m/r/g PULM:  mechanically ventilated with equal chest rise and no distress GI: soft, non-distended Extremities: warm/dry, no edema  Skin: no rashes or lesions    Resolved Hospital Problem list     Assessment & Plan:   Bradycardia with acutely worsening encephalopathy and concern for seizure CT H 6/14 without acute abnormality  -transferred back to ICU and intubated, neurology consulted -loaded with Keppra -ativan and propofol as needed for sz activity -LTM EEG ordered -bradycardia was transient and suspect associated with seizure -post-RSI hypotension treated with low dose Levophed  Acute respiratory failure  -secondary to the above -? If she has an underlying central sleep apnea based on patterns on vent  P -Maintain full vent support with SAT/SBT as tolerated -titrate Vent setting to maintain SpO2 greater than or equal to 90%. -HOB elevated 30 degrees. -Plateau pressures less than 30 cm H20.  -Follow chest x-ray, ABG prn.   -Bronchial hygiene and RT/bronchodilator protocol.  Hypotension  Improved, then worsened with sedation surrounding intubation ?Hx pots + hypovolemia + iatrogenic r/t esmolol  -AAS ruled out on repeat CTA  P -continue NE for MAP goal > 65    AKI - contrast nephropathy + hypoperfusion from hypotension P -IVF, trend renal indices UOP -eval for foley dc post extubation   Cervical radiculopathy -recent EMG -- Acute on chronic C8 radiculopathy (severe), chornic C5 radiculopathy (mild-mod) -- affecting BUE  Carpal tunnel -sounds like has pretty severe BUE weakness chronically. ? If there could be a polyneuropathy or other underlying process  P -outpt follow up   Anemia  Hx sickle cell trait  -outpt iron studies -repeat CBC pending  Hypokalemia  -replace prn  Gastric  pain Cough DOE Chest pain Weakness -need a better hx from patient post-extubation to understand ED presentation. From family, sx sound like they could fall under an AI/rheum/neuro umbrella but so vague at this point it is hard to decipher. Very likely to be best worked up [employer], but certainly obtaining better hx will help determine this -rheum labs pending -sending tick borne illness panels -- pt c/o bug bite on neck preceding sx    Best Practice (right click and "Reselect all SmartList Selections" daily)   Diet/type: NPO DVT prophylaxis: SCD GI prophylaxis: PPI Lines: Central line Foley:  Yes, and it is still needed Code Status:  full code Last date of multidisciplinary goals of care discussion [ 6/15 Hinton Dyer and Noni on phone] 6/15 Family updated with events at the bedside  Labs   CBC: Recent Labs  Lab 03/11/22 1229 03/12/22 0052 03/12/22 0633 03/12/22 0732 03/12/22 1116 03/13/22 0613  WBC 4.4  --  4.3  --   --   --   NEUTROABS  --   --  3.3  --   --   --   HGB 11.3* 13.6 9.5* 9.2* 9.9* 8.5*  HCT 36.3 40.0 29.9* 27.0* 29.0* 25.0*  MCV 88.5  --  89.3  --   --   --   PLT 226  --  202  --   --   --      Basic Metabolic Panel: Recent Labs  Lab 03/11/22 1229 03/12/22 0052 03/12/22 0633 03/12/22 0732 03/12/22 1116 03/12/22 1148 03/13/22 0411 03/13/22 0613 03/13/22 0654  NA 143   < > 145 145 142  --  139 145 145  K 3.4*   < > 3.3* 3.3* 3.4*  --  4.6 3.0* 3.3*  CL 100  --  98  --   --   --  101 103 103  CO2 35*  --  41*  --   --   --  30  --  31  GLUCOSE 120*  --  158*  --   --   --  210* 138* 140*  BUN 12  --  14  --   --   --  25* 24* 24*  CREATININE 0.64  --  0.81  --   --   --  1.29* 1.30* 1.38*  CALCIUM 10.0  --  8.9  --   --   --  8.9  --  9.5  MG  --   --  1.9  --   --   --  2.3  --   --   PHOS  --   --   --   --   --  2.4* 3.2  --   --    < > = values in this interval not displayed.    GFR: Estimated Creatinine Clearance: 28.2 mL/min (A) (by C-G  formula based on SCr of 1.38 mg/dL (H)). Recent Labs  Lab 03/11/22 1229 03/12/22 0030 03/12/22 0633 03/12/22 0708 03/12/22 1102  WBC 4.4  --  4.3  --   --   LATICACIDVEN  --  0.9  --  1.3 2.6*     Liver Function Tests: Recent Labs  Lab 03/11/22 1229 03/13/22 0411  AST 17 38  ALT 18 16  ALKPHOS 58 41  BILITOT 1.3* 1.7*  PROT 7.7 4.6*  ALBUMIN 4.0 2.7*    Recent Labs  Lab 03/11/22 1229  LIPASE 21    No results for input(s): "AMMONIA" in the last 168 hours.  ABG    Component Value Date/Time   PHART 7.535 (H) 03/12/2022 1116   PCO2ART 39.1 03/12/2022 1116   PO2ART 188 (H) 03/12/2022 1116   HCO3 33.1 (H) 03/12/2022 1116   TCO2 32 03/13/2022 0613   O2SAT 100 03/12/2022 1116     Coagulation Profile: No results for input(s): "INR", "PROTIME" in the last 168 hours.  Cardiac Enzymes: Recent Labs  Lab 03/12/22 1102  CKTOTAL 34*     HbA1C: Hemoglobin A1C  Date/Time Value Ref Range Status  04/16/2015 04:25 PM 5.5  Final   Hgb A1c MFr Bld  Date/Time Value Ref Range Status  09/06/2013 02:25 AM 5.5 <5.7 % Final    Comment:    (NOTE)                                                                       According to the ADA Clinical Practice Recommendations for 2011, when HbA1c is  used as a screening test:  >=6.5%   Diagnostic of Diabetes Mellitus           (if abnormal result is confirmed) 5.7-6.4%   Increased risk of developing Diabetes Mellitus References:Diagnosis and Classification of Diabetes Mellitus,Diabetes SHFW,2637,85(YIFOY 1):S62-S69 and Standards of Medical Care in         Diabetes - 2011,Diabetes DXAJ,2878,67 (Suppl 1):S11-S61.    CBG: Recent Labs  Lab 03/13/22 0740 03/13/22 1133 03/13/22 1552 03/13/22 2044 03/13/22 2120  GLUCAP 123* 115* 176* 207* 199*      CRITICAL CARE Performed by: Otilio Carpen Lundy Cozart   Total critical care time: 45 minutes  Critical care time was exclusive of separately billable procedures and treating other  patients. Critical care was necessary to treat or prevent imminent or life-threatening deterioration.  Critical care was time spent personally by me on the following activities: development of treatment plan with patient and/or surrogate as well as nursing, discussions with consultants, evaluation of patient's response to treatment, examination of patient, obtaining history from patient or surrogate, ordering and performing treatments and interventions, ordering and review of laboratory studies, ordering and review of radiographic studies, pulse oximetry and re-evaluation of patient's condition.  Otilio Carpen Margret Moat, PA-C [facility] See Amion for pager If no response to pager , please call [phone] until 7pm After 7:00 pm call Elink  [phone]?[place]

## 2022-03-13 NOTE — Procedures (Signed)
Extubation Procedure Note  Patient Details:   Name: Sheila Fernandez DOB: 1950-11-06 MRN: [id number]   Airway Documentation:    Vent end date: 03/13/22 Vent end time: 0904   Evaluation  O2 sats: stable throughout Complications: No apparent complications Patient did tolerate procedure well. Bilateral Breath Sounds: Diminished, Rhonchi   Yes  Pt extubated to 3L [place], pt tolerated well. Cuff leak present, no stridor noted. RN at bedside, RT will monitor.   Warrick Parisian 03/13/2022, 9:05 AM

## 2022-03-13 NOTE — Progress Notes (Signed)
Pt's son called RN into room around 2030, stating that pt had acute mental status change. When I entered, pt was sitting on the edge of the bed and appeared to be chewing her tongue. She would track eyes to me when talking to her, but would not speak and wouldn't follow commands.   VS- BP 163/86, HR 98, 96 O2 on 4L Normal. CBG 207. Very diaphoretic. Notified rapid Therapist, sports.  While staying with pt, HR briefly dipped down into the 20s around 2054- decided to call code. Pt transported to [place] and bedside report given new RN.  Jaymes Graff, RN

## 2022-03-13 NOTE — Progress Notes (Addendum)
   I was on the floor seeing another patient when Code Blue alarm sounded.   I was first MD responder.  RN and son in room. Patient had been communicative doing ok.  Became acutely unresponsive. When I walked in she had vagaled down. HR in 20s. Femoral pulse intact but agonal breathing. SBP 110. Pads placed. No CPR required.   RT responded emergently and placed FM.   HR came back quickly. Patient slowly became more responsive and able to squeeze my hands but not otherwise communicative. I suspect seizure activity with post-ictal state.   CCM and Code team in room to take over.   Code blue time 35 mins.  Glori Bickers, MD  9:09 PM

## 2022-03-13 NOTE — Progress Notes (Signed)
eLink Physician-Brief Progress Note Patient Name: Sheila Fernandez DOB: 1951/04/09 MRN: [id number]   Date of Service  03/13/2022  HPI/Events of Note  Patient transferred out to  4 E earlier today then developed acute onset of altered mental status, respiratory difficulty, and severe bradycardia  into the 20's heart rate, a code blue was called, her heart rate recovered spontaneously prior to commencement of CPR, and she was transported back to the ICU where she was intubated and placed on mechanical ventilation.  eICU Interventions  New Patient Evaluation.        Frederik Pear 03/13/2022, 9:36 PM

## 2022-03-13 NOTE — Evaluation (Signed)
Physical Therapy Evaluation Patient Details Name: Sheila Fernandez MRN: [id number] DOB: August 05, 1951 Today's Date: 03/13/2022  History of Present Illness  Pt is a 71 y.o. female admitted 03/11/22 with chest pain, abdominal pain, SOB, failure to thrive; admit to ICU with concern for possible aortic dissection. Pt with hypotension and AMS 6/14; ETT 6/14-6/15. CTA 6/14 with no evidence of aortic dissection. PMH includes HTN, orthostatic hypotension, sickle cell trait, arthritis.   Clinical Impression  Pt presents with an overall decrease in functional mobility secondary to above. PTA, pt recently moved into daughter's home (~4 wks prior) due to increased difficulty managing ADL/iADLs with bilateral hand weakness; pt typically indep ambulator without DME. Today, pt requiring min guard to minA for standing activity; limited by c/o SOB. Pt would benefit from continued acute PT services to maximize functional mobility and independence prior to d/c with HHPT services.     SpO2 down to 86% on RA; maintaining >/94% on 1L O2 [place]    Recommendations for follow up therapy are one component of a multi-disciplinary discharge planning process, led by the attending physician.  Recommendations may be updated based on patient status, additional functional criteria and insurance authorization.  Follow Up Recommendations Home health PT    Assistance Recommended at Discharge Intermittent Supervision/Assistance  Patient can return home with the following  A little help with walking and/or transfers;A little help with bathing/dressing/bathroom;Assistance with cooking/housework;Assist for transportation;Help with stairs or ramp for entrance    Equipment Recommendations  (TBD)  Recommendations for Other Services   Occupational Therapy, Mobility [facility]ists   Functional Status Assessment Patient has had a recent decline in their functional status and demonstrates the ability to make significant improvements in function  in a reasonable and predictable amount of time.     Precautions / Restrictions Precautions Precautions: Fall;Other (comment) Precaution Comments: Watch SpO2 (does not wear baseline) Restrictions Weight Bearing Restrictions: No      Mobility  Bed Mobility Overal bed mobility: Needs Assistance Bed Mobility: Supine to Sit     Supine to sit: Supervision, HOB elevated     General bed mobility comments: supervision for lines    Transfers Overall transfer level: Needs assistance Equipment used: 1 person hand held assist Transfers: Sit to/from Stand, Bed to chair/wheelchair/BSC Sit to Stand: Min assist, Min guard   Step pivot transfers: Min assist       General transfer comment: initial stand and step pivot from bed to recliner with minA for HHA to stabilize, prolonged seated rest break to recover pt c/o SOB; additional 8x repeated sit<>stand from recliner with min guard    Ambulation/Gait               General Gait Details: deferred secondary to c/o SOB and fatigue  Stairs            Wheelchair Mobility    Modified Rankin (Stroke Patients Only)       Balance Overall balance assessment: Needs assistance Sitting-balance support: No upper extremity supported, Feet unsupported Sitting balance-Leahy Scale: Good     Standing balance support: No upper extremity supported Standing balance-Leahy Scale: Fair Standing balance comment: can static stand without UE support, unable to accept significant challenge                             Pertinent Vitals/Pain Pain Assessment Pain Assessment: No/denies pain Pain Intervention(s): Monitored during session    Home Living Family/patient expects to be discharged to::  Private residence Living Arrangements: Children Available Help at Discharge: Family Type of Home: House Home Access: Stairs to enter   Technical brewer of Steps: 1   Home Layout: One level Home Equipment: Shower  seat Additional Comments: Recently moved into daughter's home (~4 wks ago) due to increased difficulty with ADLs/iADLs due to worsening bilat hand weakness (pt refers to it as carpal tunnel)    Prior Function Prior Level of Function : Needs assist       Physical Assist : ADLs (physical)   ADLs (physical): Bathing;Dressing;IADLs Mobility Comments: Indep ambulation without DME ADLs Comments: Pt reports indep with toileting; daughter assists with bathing seated in shower; requires some assist from daughter for dressing. Daughter does majority of cooking/cleaning     Hand Dominance        Extremity/Trunk Assessment   Upper Extremity Assessment Upper Extremity Assessment: RUE deficits/detail;LUE deficits/detail RUE Deficits / Details: reports h/o carpal tunnel with worsening bilateral hand weakness RUE Coordination: decreased fine motor LUE Deficits / Details: reports h/o carpal tunnel with worsening bilateral hand weakness LUE Coordination: decreased fine motor    Lower Extremity Assessment Lower Extremity Assessment: Generalized weakness       Communication   Communication: HOH  Cognition Arousal/Alertness: Awake/alert Behavior During Therapy: Flat affect Overall Cognitive Status: Within Functional Limits for tasks assessed                                 General Comments: WFL for simple tasks, not formally assessed; following simple commands and answering questions appropriately; flat affect, suspect related to fatigue        General Comments General comments (skin integrity, edema, etc.): SpO2 down to 86% on RA, maintaining 94% on 1L O2 [place]; HR 80. Pt's son and grandson present, supportive    Exercises Other Exercises Other Exercises: pt able to perform 8 reps in 30-sec sit<>stand (without UE support) - normative data for 40s y.o. is 12 reps   Assessment/Plan    PT Assessment Patient needs continued PT services  PT Problem List Decreased  strength;Decreased activity tolerance;Decreased balance;Decreased mobility;Decreased knowledge of use of DME;Cardiopulmonary status limiting activity       PT Treatment Interventions DME instruction;Gait training;Stair training;Therapeutic activities;Balance training;Therapeutic exercise;Patient/family education    PT Goals (Current goals can be found in the Care Plan section)  Acute Rehab PT Goals Patient Stated Goal: plans to return to daughter's home, but she's hopeful to eventually return to her own home with increased independence PT Goal Formulation: With patient Time For Goal Achievement: 03/27/22 Potential to Achieve Goals: Good    Frequency Min 3X/week     Co-evaluation               AM-PAC PT "6 Clicks" Mobility  Outcome Measure Help needed turning from your back to your side while in a flat bed without using bedrails?: None Help needed moving from lying on your back to sitting on the side of a flat bed without using bedrails?: A Little Help needed moving to and from a bed to a chair (including a wheelchair)?: A Little Help needed standing up from a chair using your arms (e.g., wheelchair or bedside chair)?: A Little Help needed to walk in hospital room?: Total Help needed climbing 3-5 steps with a railing? : Total 6 Click Score: 15    End of Session Equipment Utilized During Treatment: Oxygen Activity Tolerance: Patient tolerated treatment well;Patient limited by fatigue  Patient left: in chair;with call bell/phone within reach;with family/visitor present Nurse Communication: Mobility status PT Visit Diagnosis: Other abnormalities of gait and mobility (R26.89);Muscle weakness (generalized) (M62.81)    Time: 2297-9892 PT Time Calculation (min) (ACUTE ONLY): 23 min   Charges:   PT Evaluation $PT Eval Moderate Complexity: 1 Mod        Mabeline Caras, PT, DPT Acute Rehabilitation Services  Pager [phone] Office [place] 03/13/2022, 5:38 PM

## 2022-03-13 NOTE — Progress Notes (Signed)
Patient arrived to 4e-07

## 2022-03-14 ENCOUNTER — Inpatient Hospital Stay (HOSPITAL_COMMUNITY): Payer: Medicare Other

## 2022-03-14 DIAGNOSIS — E43 Unspecified severe protein-calorie malnutrition: Secondary | ICD-10-CM | POA: Diagnosis not present

## 2022-03-14 DIAGNOSIS — G9340 Encephalopathy, unspecified: Secondary | ICD-10-CM | POA: Diagnosis not present

## 2022-03-14 DIAGNOSIS — R0689 Other abnormalities of breathing: Secondary | ICD-10-CM

## 2022-03-14 DIAGNOSIS — J9601 Acute respiratory failure with hypoxia: Secondary | ICD-10-CM | POA: Diagnosis not present

## 2022-03-14 DIAGNOSIS — I959 Hypotension, unspecified: Secondary | ICD-10-CM | POA: Diagnosis not present

## 2022-03-14 DIAGNOSIS — I71019 Dissection of thoracic aorta, unspecified: Secondary | ICD-10-CM | POA: Diagnosis not present

## 2022-03-14 DIAGNOSIS — R569 Unspecified convulsions: Secondary | ICD-10-CM | POA: Diagnosis not present

## 2022-03-14 LAB — LYME DISEASE, WESTERN BLOT
IgG P18 Ab.: ABSENT
IgG P23 Ab.: ABSENT
IgG P28 Ab.: ABSENT
IgG P30 Ab.: ABSENT
IgG P39 Ab.: ABSENT
IgG P45 Ab.: ABSENT
IgG P66 Ab.: ABSENT
IgM P23 Ab.: ABSENT
IgM P39 Ab.: ABSENT
IgM P41 Ab.: ABSENT
Lyme IgG Wb: NEGATIVE
Lyme IgM Wb: NEGATIVE

## 2022-03-14 LAB — CBC
HCT: 27.9 % — ABNORMAL LOW (ref 36.0–46.0)
Hemoglobin: 9.1 g/dL — ABNORMAL LOW (ref 12.0–15.0)
MCH: 28.8 pg (ref 26.0–34.0)
MCHC: 32.6 g/dL (ref 30.0–36.0)
MCV: 88.3 fL (ref 80.0–100.0)
Platelets: 152 10*3/uL (ref 150–400)
RBC: 3.16 MIL/uL — ABNORMAL LOW (ref 3.87–5.11)
RDW: 14.2 % (ref 11.5–15.5)
WBC: 13.4 10*3/uL — ABNORMAL HIGH (ref 4.0–10.5)
nRBC: 0 % (ref 0.0–0.2)

## 2022-03-14 LAB — BASIC METABOLIC PANEL
Anion gap: 8 (ref 5–15)
BUN: 14 mg/dL (ref 8–23)
CO2: 30 mmol/L (ref 22–32)
Calcium: 8.7 mg/dL — ABNORMAL LOW (ref 8.9–10.3)
Chloride: 108 mmol/L (ref 98–111)
Creatinine, Ser: 0.76 mg/dL (ref 0.44–1.00)
GFR, Estimated: >60 mL/min (ref >60)
Glucose, Bld: 99 mg/dL (ref 70–99)
Potassium: 4.1 mmol/L (ref 3.5–5.1)
Sodium: 146 mmol/L — ABNORMAL HIGH (ref 135–145)

## 2022-03-14 LAB — PHOSPHORUS
Phosphorus: <1 mg/dL — CL (ref 2.5–4.6)
Phosphorus: 1.6 mg/dL — ABNORMAL LOW (ref 2.5–4.6)

## 2022-03-14 LAB — COMPREHENSIVE METABOLIC PANEL
ALT: 26 U/L (ref 0–44)
AST: 25 U/L (ref 15–41)
Albumin: 2.8 g/dL — ABNORMAL LOW (ref 3.5–5.0)
Alkaline Phosphatase: 50 U/L (ref 38–126)
Anion gap: 7 (ref 5–15)
BUN: 13 mg/dL (ref 8–23)
CO2: 30 mmol/L (ref 22–32)
Calcium: 8.5 mg/dL — ABNORMAL LOW (ref 8.9–10.3)
Chloride: 105 mmol/L (ref 98–111)
Creatinine, Ser: 0.75 mg/dL (ref 0.44–1.00)
GFR, Estimated: >60 mL/min (ref >60)
Glucose, Bld: 163 mg/dL — ABNORMAL HIGH (ref 70–99)
Potassium: 2.9 mmol/L — ABNORMAL LOW (ref 3.5–5.1)
Sodium: 142 mmol/L (ref 135–145)
Total Bilirubin: 1.4 mg/dL — ABNORMAL HIGH (ref 0.3–1.2)
Total Protein: 5.7 g/dL — ABNORMAL LOW (ref 6.5–8.1)

## 2022-03-14 LAB — GLUCOSE, CAPILLARY
Glucose-Capillary: 75 mg/dL (ref 70–99)
Glucose-Capillary: 78 mg/dL (ref 70–99)

## 2022-03-14 LAB — CULTURE, RESPIRATORY W GRAM STAIN
Culture: NORMAL
Gram Stain: NONE SEEN

## 2022-03-14 LAB — TROPONIN I (HIGH SENSITIVITY)
Troponin I (High Sensitivity): 123 ng/L (ref <18)
Troponin I (High Sensitivity): 132 ng/L (ref <18)

## 2022-03-14 LAB — MAGNESIUM: Magnesium: 2.1 mg/dL (ref 1.7–2.4)

## 2022-03-14 LAB — VITAMIN C: Vitamin C: 1.1 mg/dL (ref 0.4–2.0)

## 2022-03-14 LAB — LACTIC ACID, PLASMA: Lactic Acid, Venous: 2 mmol/L (ref 0.5–1.9)

## 2022-03-14 LAB — CYTOLOGY - NON PAP

## 2022-03-14 LAB — TRIGLYCERIDES: Triglycerides: 80 mg/dL (ref <150)

## 2022-03-14 MED ORDER — ORAL CARE MOUTH RINSE: 15.0000 mL | OROMUCOSAL | Status: DC | PRN

## 2022-03-14 MED ORDER — DEXMEDETOMIDINE HCL IN NACL 400 MCG/100ML IV SOLN: 0.4000 ug/kg/h | INTRAVENOUS | Status: DC

## 2022-03-14 MED ORDER — THIAMINE HCL 100 MG PO TABS
100.0000 mg | ORAL_TABLET | Freq: Every day | ORAL | Status: DC
  Administered 2022-03-14 – 2022-04-08 (×26): 100 mg
  Filled 2022-03-14 (×26): qty 1

## 2022-03-14 MED ORDER — GADOBUTROL 1 MMOL/ML IV SOLN
5.5000 mL | Freq: Once | INTRAVENOUS | Status: AC | PRN
  Administered 2022-03-14: 5.5 mL via INTRAVENOUS

## 2022-03-14 MED ORDER — LEVETIRACETAM IN NACL 500 MG/100ML IV SOLN
500.0000 mg | Freq: Two times a day (BID) | INTRAVENOUS | Status: DC
  Administered 2022-03-14 – 2022-03-28 (×29): 500 mg via INTRAVENOUS
  Filled 2022-03-14 (×29): qty 100

## 2022-03-14 MED ORDER — VITAL AF 1.2 CAL PO LIQD
1000.0000 mL | ORAL | Status: DC
Start: 2022-03-14 — End: 2022-03-14

## 2022-03-14 MED ORDER — DEXTROSE 5 % IV SOLN
15.0000 mmol | Freq: Once | INTRAVENOUS | Status: DC
  Filled 2022-03-14: qty 5

## 2022-03-14 MED ORDER — SODIUM PHOSPHATES 45 MMOLE/15ML IV SOLN
45.0000 mmol | Freq: Once | INTRAVENOUS | Status: AC
  Administered 2022-03-14: 45 mmol via INTRAVENOUS
  Filled 2022-03-14: qty 15

## 2022-03-14 MED ORDER — DOCUSATE SODIUM 50 MG/5ML PO LIQD
100.0000 mg | Freq: Two times a day (BID) | ORAL | Status: DC
  Administered 2022-03-14 – 2022-03-21 (×14): 100 mg
  Filled 2022-03-14 (×14): qty 10

## 2022-03-14 MED ORDER — SODIUM CHLORIDE 0.9 % IV SOLN: INTRAVENOUS | Status: DC | PRN

## 2022-03-14 MED ORDER — PANTOPRAZOLE 2 MG/ML SUSPENSION
40.0000 mg | Freq: Every day | ORAL | Status: DC
Start: 2022-03-14 — End: 2022-04-08
  Administered 2022-03-14 – 2022-04-08 (×26): 40 mg
  Filled 2022-03-14 (×27): qty 20

## 2022-03-14 MED ORDER — POTASSIUM CHLORIDE 10 MEQ/50ML IV SOLN
10.0000 meq | INTRAVENOUS | Status: AC
  Administered 2022-03-14 (×4): 10 meq via INTRAVENOUS
  Filled 2022-03-14 (×4): qty 50

## 2022-03-14 MED ORDER — ORAL CARE MOUTH RINSE
15.0000 mL | OROMUCOSAL | Status: DC
  Administered 2022-03-14 – 2022-03-24 (×123): 15 mL via OROMUCOSAL

## 2022-03-14 MED ORDER — ADULT MULTIVITAMIN W/MINERALS CH
1.0000 | ORAL_TABLET | Freq: Every day | ORAL | Status: DC
  Administered 2022-03-14 – 2022-04-08 (×26): 1
  Filled 2022-03-14 (×26): qty 1

## 2022-03-14 MED ORDER — MAGNESIUM SULFATE 2 GM/50ML IV SOLN
2.0000 g | Freq: Once | INTRAVENOUS | Status: AC
  Administered 2022-03-14: 2 g via INTRAVENOUS
  Filled 2022-03-14: qty 50

## 2022-03-14 MED ORDER — VITAL AF 1.2 CAL PO LIQD
1000.0000 mL | ORAL | Status: DC
  Administered 2022-03-14 – 2022-04-07 (×25): 1000 mL

## 2022-03-14 MED ORDER — POTASSIUM CHLORIDE 20 MEQ PO PACK
40.0000 meq | PACK | Freq: Once | ORAL | Status: AC
Start: 2022-03-14 — End: 2022-03-14
  Administered 2022-03-14: 40 meq
  Filled 2022-03-14: qty 2

## 2022-03-14 NOTE — Plan of Care (Signed)
   Currently intubated and sedated, plan for MRI brain and c-spine w/ wo contrast after LTM. Need to wean sedation down while still on LTM.  Discussed with CCM NP and family at the bedside. She has been hemodynamically stable since she has been back in the ICU.   EEG 6/15 2232 - 6/16 0830 This study is suggestive of moderate diffuse encephalopathy, nonspecific etiology but could be secondary to sedation.  No seizures or epileptiform discharges were seen throughout the recording.   Event button was pressed on 03/14/2022 at 0448 for transient facial and shoulder twitching without concomitant EEG change.  This was most likely not an epileptic event.  However focal motor seizures may not be seen on scalp EEG.  Therefore clinical correlation is recommended.   Lactic acid 2.0 WBC 13.4 Troponin- 123 -> peak 132 K 2.9-> 4.1 B12 - 532 Sed 13 T3 1.8 TSH 1.131 CEA 1.4 CRP 1.6 [place]- pending [place] spotted fever- pending B1- pending   Janine Ores, DNP, FNP-BC [facility] Pager: [phone]

## 2022-03-14 NOTE — Progress Notes (Signed)
LTM EEG hooked up and running - no initial skin breakdown - push button tested - neuro notified. [facility] monitoring.  

## 2022-03-14 NOTE — Progress Notes (Signed)
LTM EEG reviewed. There is excessive muscle artifact but no definite electrographic seizures. Beta wave activity is noted. Findings are symmetric when comparing the right and left leads.   Electronically signed: Dr. Kerney Elbe

## 2022-03-14 NOTE — Procedures (Addendum)
Patient Name: Sheila Fernandez  MRN: [id number]  Epilepsy Attending: Charlsie Quest  Referring Physician/Provider: Gleason, Darcella Gasman, PA-C  Duration: 03/13/2022 2232 to 03/14/2022 1638   Patient history: 71 year old female with altered mental status.  EEG to evaluate for seizure.   Level of alertness: Awake, asleep   AEDs during EEG study: propofol   Technical aspects: This EEG study was done with scalp electrodes positioned according to the 10-20 International system of electrode placement. Electrical activity was acquired at a sampling rate of 500Hz  and reviewed with a high frequency filter of 70Hz  and a low frequency filter of 1Hz . EEG data were recorded continuously and digitally stored.    Description: No clear posterior dominant rhythm was seen. Sleep was characterized by vertex waves, sleep spindles (12 to 14 Hz), maximal frontocentral region.  EEG showed continuous generalized slowing admixed with 15 to 18 Hz beta activity distributed symmetrically and diffusely. As sedation was weaned, EEG showed posterior dominant rhythm of 8 Hz activity of moderate voltage (25-35 uV) seen predominantly in posterior head regions, symmetric and reactive to eye opening and eye closing. Hyperventilation and photic stimulation were not performed.     Patient event button was pressed on 03/14/2022 at 0448 for transient facial and shoulder twitching. Concomitant EEG before, during and after the event did not show any EEG change to suggest seizure.  EEG was unhooked at 1638 for MRI   ABNORMALITY - Continuous slow, generalized - Excessive beta, generalized   IMPRESSION: This study was initially suggestive of moderate diffuse encephalopathy, nonspecific etiology but could be secondary to sedation.  As sedation was weaned, EEG improved and was within normal limits.No seizures or epileptiform discharges were seen throughout the recording.  Event button was pressed on 03/14/2022 at 0448 for transient facial  and shoulder twitching without concomitant EEG change.  This was most likely not an epileptic event.  However focal motor seizures may not be seen on scalp EEG.  Therefore clinical correlation is recommended.   Modupe Shampine Annabelle Harman

## 2022-03-14 NOTE — Progress Notes (Signed)
Event Summary Note  Pt arrived to 2H09 from 4E s/p unresponsive episode during which pt became acutely bradycardic and breathing agonally. Pt lethargic on arrival, RASS -3/-4, able to f/c intermittently. Unable to protect airway d/t AMS, so Dr. Valeta Harms, already at bedside, intubated emergently at 2136.  RSI meds given (also charted in eMAR): '4mg'$  Versed '20mg'$  etomidate '50mg'$  rocuronium  ETT 7.5, 22cm at the lip. Placement confirmed by auscultation, positive color change, and CXR.   Pt hypotensive s/p RSI, given 500cc NS bolus and 420mg Neo per Dr. IValeta Harmsand APP Gleason (present at bedside during admin), then started on Levo gtt.   Then gave loading dose of IVPB Keppra d/t c/f seizures. EEG monitoring initiated.  Family updated by PCCM, visited pt and went home for night. Son staying bedside overnight.

## 2022-03-14 NOTE — Progress Notes (Signed)
OT Cancellation Note  Patient Details Name: Sheila Fernandez MRN: [id number] DOB: 12/03/1950   Cancelled Treatment:    Reason Eval/Treat Not Completed: Medical issues which prohibited therapy.  Code blue overnight, re-intubated.  Continue efforts.    Janene Yousuf D Rien Marland 03/14/2022, 9:57 AM

## 2022-03-14 NOTE — Progress Notes (Signed)
[place] notified of critical lab values as well as K 2.9. No further orders regarding LA or troponin. Awaiting KCL replacement orders.

## 2022-03-14 NOTE — Progress Notes (Signed)
NAME:  Sheila Fernandez, MRN:  [id number], DOB:  [birth year], LOS: 3 ADMISSION DATE:  03/11/2022, CONSULTATION DATE:  03/11/22 REFERRING MD:  Darl Householder, CHIEF COMPLAINT:  Chest pain, abdominal pain  History of Present Illness:  71 yo woman with chest pain, abdominal pain, SOB. Sudden onset epigastric pain radiating to back since yesterday.  SOB.  BP elevated always, missed her bp meds this am.  CT chest several months ago, mass vs atelectasis.  Here with mid abdominal pain since yesterday.  Low back pain.  SOB, fatigue x 3 months Fatigue and weight loss x 6 months.  Recent abdominal pain, started on protonix    Admitted to ICU, CT abdomen/pelvis negative for AAS, initially on esmolol infusion then required Levophed for hypotension and intubation for encephalopathy. Spot EEG and head CT were negative.  She was extubated 6/14 and transferred out of the ICU.    Overnight on 6/15, pt was ambulating and conversing with family when she suddenly became restless and then altered and had an episode of bradycardia to the 40's.  A code blue was called, but pt never lost pulses, she did require bagging for a short time.  Her HR improved without medications and she was responding to some commands, but had upwardly deviated gaze and flexion of the R arm.  She was transferred to the ICU and re-intubated and loaded with Keppra   Pertinent  Medical History  HTN Orthostatic hypotension  Anemia Arthritis Iritis, Recurrent Sickle cell trait  Norvasc 2.5, cored 25 BID, vit d  Xyzal 26m daily Losartan 1065mdaily Remeron 7.5 mg dialy Protonix 4081maily  Prednisolone 1% 2 drops into the L eye QID     PCV ECHOCARDIOGRAM COMPLETE 12/10/2021  Echocardiogram 12/10/2021: Left ventricle cavity is normal in size and wall thickness. Normal global wall motion. Normal LV systolic function with EF 61%. Doppler evidence of grade I (impaired) diastolic dysfunction, normal LAP. Mild mitral valve stenosis. Mild  tricuspid regurgitation. IVC not seen. No significant change compared to previous study in 2019.  PCV MYOCARDIAL PERFUSION WITH LEXISCAN 01/29/2022 Lexiscan Tetrofosmin stress test 02/01/2022: Lexiscan nuclear stress test performed using 1-day protocol. Normal myocardial perfusion. Stress LVEF 57%. Low risk study.  Significant Hospital Events: Including procedures, antibiotic start and stop dates in addition to other pertinent events   6/13 admitted to ICU for esmolol gtt with question of possible dissection 6/14 hypotensive, AMS on esmolol 300. Intubated. Starting NE. CT H neg. CTA no AAS Extubate 6/15 6/15-16 night Episode of AMS and bradycardia, concern for sz, re-intubated and loaded with Keppra 6/16 still no sz on EEG. Plan for MRI brain c spine and cEEG   Interim History / Subjective:   Overnight events noted   This morning is sedated on prop and on 2 NE    Objective   Blood pressure 111/75, pulse 68, temperature 99 F (37.2 C), temperature source Oral, resp. rate 20, height _0  (1.549 m), weight 58.5 kg, SpO2 100 %.    Vent Mode: PRVC FiO2 (%):  [40 %-60 %] 40 % Set Rate:  [20 bmp] 20 bmp Vt Set:  [380 mL] 380 mL PEEP:  [5 cmH20] 5 cmH20 Plateau Pressure:  [16 cmH20-18 cmH20] 16 cmH20   Intake/Output Summary (Last 24 hours) at 03/14/2022 1029 Last data filed at 03/14/2022 1000 Gross per 24 hour  Intake 1755.79 ml  Output 600 ml  Net 1155.79 ml   Filed Weights   03/13/22 0500 03/13/22 2125 03/14/22 0545  Weight:  54.9 kg 58.5 kg 58.5 kg    General:  frail and thin appearing elderly F intubated sedate NAD  HEENT: NCAT pink mm ETT secure Neuro: Opens eyes to stimulation. PERRL 25m. Grimaces and withdraws to pain  CV: rrr s1s2 no rgm  PULM:  Symmetrical chest expansion, even unlabored on RA  GI: thin soft ndnt Extremities: no acute joint deformity no cyanosis or clubbing  Skin: c/d/w   Resolved Hospital Problem list   AKI   Assessment & Plan:   Suspected  acute seizure  Acute encephalopathy (?post-ictal vs ?low flow state)  -CT H 6/14 without acute abnormality following prior bradycardic + AMS event. EEG after that event was also neg for sz.  -cEEG is running now, thus far no evidence of sz but could be confounded by prop P -cont Keppra -I have ordered MRI brain and C Spine w/wo.  -cEEG -- so far not revealing. Will wean sedation, see if this helps capture anything   ?Autonomic dysfunction  Hypotension -question of POTS outpt.  -Events inpt are somewhat difficult to parse through. Doesn't sound like she has had obvious positional changes preceding hemodynamic changes, nor vasovagal events.  P -MRI brain C spine w/wo  -NE for MAP >65  Acute respiratory failure requiring reintubation/ endotracheal tube in place  -secondary to the above. -? If she has an underlying central sleep apnea based on patterns on vent from vent wean process from first intubation -on second intubation, has R hemidiaphragm  P -think we should remain intubated for MRIs given the abrupt onset of both of these brady/AMS/?sz episodes without clear precipitants  -after MRI, eval for WUA/sBT. If unable to pursue 6/17, WUA/SBT starting 6/17  -follow BAL.    Hypokalemia -replace K, giving 2g mag  -afternoon bmp  Cervical radiculopathy  -recent EMG -- Acute on chronic C8 radiculopathy (severe), chornic C5 radiculopathy (mild-mod) -- affecting BUE  Carpal tunnel.   -family reporting subjective wknss BUE BLE, EMGs only on BUE. ? If there could be other underlying process, but BUE EMG changes certainly could be attributed to known processes P -outpt follow up -MRIs as above   Anemia, chronic - stable  Hx sickle cell trait  -PRN CBC   Constellation of symptoms and problems:  Gastric pain -- This was presenting sx to hospital. No s/sx GIB Cough DOE Chest pain Weakness Weight loss Severe malnutrition -need a better hx from patient post-extubation to understand  ED presentation. From family, there is a myriad of outpt sx and a general feeling of something not being right for the past several months. Pt noticed sx either onset or worsening after a bite on her neck, which she thinks may have been a tick but is not sure. sx sound somewhat like they could fall under an AI/rheum/neuro umbrella but so vague at this point it is hard to decipher.  P -follow cancer markers, autoimmune labs, tickborne illness panels, micronutrient labs   -cont PPI which is currently   -RDN consult     Best Practice (right click and "Reselect all SmartList Selections" daily)   Diet/type: tubefeeds DVT prophylaxis: SCD GI prophylaxis: PPI Lines: Central line Foley:  N/A Code Status:  full code Last date of multidisciplinary goals of care discussion [ 6/16 son, 2 daughters updated at bedside 6/16, [facility] ]  Labs   CBC: Recent Labs  Lab 03/11/22 1229 03/12/22 0052 03/12/22 0829906/14/23 0732 03/12/22 1116 03/13/22 0613 03/13/22 2251 03/13/22 2331  WBC  4.4  --  4.3  --   --   --   --  13.4*  NEUTROABS  --   --  3.3  --   --   --   --   --   HGB 11.3*   < > 9.5* 9.2* 9.9* 8.5* 9.5* 9.1*  HCT 36.3   < > 29.9* 27.0* 29.0* 25.0* 28.0* 27.9*  MCV 88.5  --  89.3  --   --   --   --  88.3  PLT 226  --  202  --   --   --   --  152   < > = values in this interval not displayed.    Basic Metabolic Panel: Recent Labs  Lab 03/12/22 0633 03/12/22 0732 03/12/22 1148 03/13/22 0411 03/13/22 0881 03/13/22 0654 03/13/22 2251 03/13/22 2331 03/14/22 0638  NA 145   < >  --  139 145 145 144 142 146*  K 3.3*   < >  --  4.6 3.0* 3.3* 3.1* 2.9* 4.1  CL 98  --   --  101 103 103  --  105 108  CO2 41*  --   --  30  --  31  --  30 30  GLUCOSE 158*  --   --  210* 138* 140*  --  163* 99  BUN 14  --   --  25* 24* 24*  --  13 14  CREATININE 0.81  --   --  1.29* 1.30* 1.38*  --  0.75 0.76  CALCIUM 8.9  --   --  8.9  --  9.5  --  8.5* 8.7*  MG 1.9  --   --  2.3   --   --   --   --   --   PHOS  --   --  2.4* 3.2  --   --   --   --   --    < > = values in this interval not displayed.   GFR: Estimated Creatinine Clearance: 53 mL/min (by C-G formula based on SCr of 0.76 mg/dL). Recent Labs  Lab 03/11/22 1229 03/12/22 0030 03/12/22 1031 03/12/22 0708 03/12/22 1102 03/13/22 2110 03/13/22 2331  WBC 4.4  --  4.3  --   --   --  13.4*  LATICACIDVEN  --    < >  --  1.3 2.6* 1.0 2.0*   < > = values in this interval not displayed.    Liver Function Tests: Recent Labs  Lab 03/11/22 1229 03/13/22 0411 03/13/22 2331  AST 17 38 25  ALT _0 ALKPHOS 58 41 50  BILITOT 1.3* 1.7* 1.4*  PROT 7.7 4.6* 5.7*  ALBUMIN 4.0 2.7* 2.8*   Recent Labs  Lab 03/11/22 1229  LIPASE 21   No results for input(s): "AMMONIA" in the last 168 hours.  ABG    Component Value Date/Time   PHART 7.417 03/13/2022 2251   PCO2ART 48.0 03/13/2022 2251   PO2ART 123 (H) 03/13/2022 2251   HCO3 31.0 (H) 03/13/2022 2251   TCO2 33 (H) 03/13/2022 2251   O2SAT 99 03/13/2022 2251     Coagulation Profile: No results for input(s): "INR", "PROTIME" in the last 168 hours.  Cardiac Enzymes: Recent Labs  Lab 03/12/22 1102  CKTOTAL 34*    HbA1C: Hemoglobin A1C  Date/Time Value Ref Range Status  04/16/2015 04:25 PM 5.5  Final   Hgb A1c MFr Bld  Date/Time Value Ref  Range Status  09/06/2013 02:25 AM 5.5 <5.7 % Final    Comment:    (NOTE)                                                                       According to the ADA Clinical Practice Recommendations for 2011, when HbA1c is used as a screening test:  >=6.5%   Diagnostic of Diabetes Mellitus           (if abnormal result is confirmed) 5.7-6.4%   Increased risk of developing Diabetes Mellitus References:Diagnosis and Classification of Diabetes Mellitus,Diabetes JHHI,3437,35(DIXBO 1):S62-S69 and Standards of Medical Care in         Diabetes - 2011,Diabetes ERQS,1282,08 (Suppl 1):S11-S61.     CBG: Recent Labs  Lab 03/13/22 0740 03/13/22 1133 03/13/22 1552 03/13/22 2044 03/13/22 2120  GLUCAP 123* 115* 176* 207* 199*     CRITICAL CARE Performed by: Cristal Generous   Total critical care time: 42 minutes  Critical care time was exclusive of separately billable procedures and treating other patients. Critical care was necessary to treat or prevent imminent or life-threatening deterioration.  Critical care was time spent personally by me on the following activities: development of treatment plan with patient and/or surrogate as well as nursing, discussions with consultants, evaluation of patient's response to treatment, examination of patient, obtaining history from patient or surrogate, ordering and performing treatments and interventions, ordering and review of laboratory studies, ordering and review of radiographic studies, pulse oximetry and re-evaluation of patient's condition.   Eliseo Gum MSN, AGACNP-BC [place] for pager 03/14/2022, 10:29 AM

## 2022-03-14 NOTE — Progress Notes (Signed)
Sheila Fernandez remains intubated and on critical care's service.  Will sign off.

## 2022-03-14 NOTE — Progress Notes (Addendum)
PT Cancellation Note  Patient Details Name: Sheila Fernandez MRN: [id number] DOB: 1951-01-19   Cancelled Treatment:    Reason Eval/Treat Not Completed: Noted overnight code blue event, patient reintubated; awaiting MRI this AM. Will follow-up on Monday for PT treatment/re-evaluation. Please reach out if new needs arise over weekend.  Mabeline Caras, PT, DPT Acute Rehabilitation Services  Pager [phone] Office [place] 03/14/2022, 7:31 AM

## 2022-03-14 NOTE — Progress Notes (Signed)
eLink Physician-Brief Progress Note Patient Name: Sheila Fernandez DOB: Jul 19, 1951 MRN: [id number]   Date of Service  03/14/2022  HPI/Events of Note  K+ 2.9, Cr  0.75, Troponin 132 s/p  brady-hypotensive event.  eICU Interventions  E-Link electrolyte replacement protocol for   K+ ordered, will continue to trend Troponin and obtain AM echocardiogram.        Kerry Kass [name] 03/14/2022, 12:45 AM

## 2022-03-14 NOTE — Progress Notes (Signed)
eLink Physician-Brief Progress Note Patient Name: Sheila Fernandez DOB: April 06, 1951 MRN: [id number]   Date of Service  03/14/2022  HPI/Events of Note  Patient with severe urinary retention (bladder scan positive for > 600 ml of urine in the bladder x 2).  eICU Interventions  Foley catheter ordered.        Kerry Kass Malan Werk 03/14/2022, 11:38 PM

## 2022-03-14 NOTE — Consult Note (Addendum)
NEURO HOSPITALIST CONSULT NOTE   Requestig physician: Dr. Shearon Stalls  Reason for Consult: Seizure-like activity  History obtained from:  Chart     HPI:                                                                                                                                          Sheila Fernandez is a 71 y.o. female with a PMHx of adenomyosis, sickle cell trait, anemia, arthritis, HTN, recurrent iritis, orthostatic hypotension and uterine fibroids who presented to the hospital with abdominal pain, chest pain, acute respiratory failure with hypercarbia and acute metabolic encephalopathy. EEG obtained on Wednesday was within normal limits.   Yesterday night she had acute mental status change; she was noted to be sitting on th edge of her bed and appeared to be chewing her tongue. She could track her examiner but would not speak or follow commands. She was very diaphoretic at that time. Her HR briefly dipped down to the 20's along with agonal breathing and it was decided to call a code. Pads were placed, but no CPR was required. Her HR came back quickly and patient then slowly became more responsive; she could squeeze hands but was otherwise not communicative. CCM team noted eyes deviated upwards and flexion of the right arm, including right hand and wrist rhythmic movements. CCM and Cardiology teams both suspected seizure activity with postictal state. She was transported to the ICU, reintubated and loaded with Keppra. Neurology was consulted for evaluation of possible seizure.   Past Medical History:  Diagnosis Date   Adenomyosis    Anemia    Arthritis    Hypertension    Iritis, recurrent    Orthostatic hypotension 01/21/2019   Sickle cell trait ([place])    Uterine fibroid     Past Surgical History:  Procedure Laterality Date   BREAST BIOPSY Left 1986   benign excisional no scar seen    BREAST LUMPECTOMY Left    Pt does not recall having lumpectomy   CESAREAN  SECTION     CHOLECYSTECTOMY     FOOT SURGERY     cyst removed    Family History  Problem Relation Age of Onset   Cancer Mother 93       Pancreatic   Hypertension Mother    Stroke Father    Cancer Father 2       Prostate   Hypertension Father    Breast cancer Sister    Diabetes Sister    HIV Sister    Cancer Sister        Cancer r/t HIV   Lupus Daughter    Diabetes Maternal Grandmother    Diabetes Brother    Hypertension Brother    Kidney disease Brother    Cancer Brother  Prostate   Colon cancer Neg Hx    Esophageal cancer Neg Hx    Stomach cancer Neg Hx    Colon polyps Neg Hx               Social History:  reports that she quit smoking about 18 years ago. Her smoking use included cigarettes. She has a 15.00 pack-year smoking history. She has never used smokeless tobacco. She reports that she does not drink alcohol and does not use drugs.  Allergies  Allergen Reactions   Codeine Nausea And Vomiting    MEDICATIONS:                                                                                                                     Prior to Admission:  Medications Prior to Admission  Medication Sig Dispense Refill Last Dose   amLODipine (NORVASC) 2.5 MG tablet Take 2.5 mg by mouth daily.   03/10/2022   carvedilol (COREG) 25 MG tablet Take 25 mg by mouth 2 (two) times daily with a meal.   03/10/2022 at 0800   Cholecalciferol (VITAMIN D PO) Take 1 tablet by mouth daily.   03/10/2022   co-enzyme Q-10 30 MG capsule Take 30 mg by mouth daily.   03/10/2022   levocetirizine (XYZAL) 5 MG tablet Take 5 mg by mouth daily.   Past Month   losartan (COZAAR) 100 MG tablet Take 1 tablet (100 mg total) by mouth daily. Pt needs to schedule a f/u appt for further refills. 30 tablet 1 03/10/2022   Misc Natural Products (DANDELION ROOT PO) Take 1 capsule by mouth daily.   03/10/2022   pantoprazole (PROTONIX) 40 MG tablet Take 40 mg by mouth daily.   03/11/2022   prednisoLONE acetate  (OMNIPRED) 1 % ophthalmic suspension Place 2 drops into the left eye 4 (four) times daily. (Patient taking differently: Place 2 drops into the left eye daily as needed (For eye infection).) 5 mL 1 Past Month   mirtazapine (REMERON) 7.5 MG tablet Take 7.5 mg by mouth at bedtime. (Patient not taking: Reported on 03/11/2022)   Not Taking   Scheduled:  Chlorhexidine Gluconate Cloth  6 each Topical Daily   docusate  100 mg Per Tube BID   midazolam       multivitamin with minerals  1 tablet Per Tube Daily   ondansetron (ZOFRAN) IV  4 mg Intravenous Once   mouth rinse  15 mL Mouth Rinse Q2H   pantoprazole sodium  40 mg Per Tube Daily   potassium chloride  40 mEq Per Tube Once   sodium chloride flush  10-40 mL Intracatheter Q12H   thiamine  100 mg Per Tube Daily   Continuous:  sodium chloride Stopped (03/12/22 1747)   sodium chloride 20 mL/hr at 03/14/22 0700   sodium chloride 20 mL/hr at 03/14/22 0300   sodium chloride 20 mL/hr at 03/14/22 0700   magnesium sulfate bolus IVPB     norepinephrine (LEVOPHED) Adult infusion 2 mcg/min (03/14/22 0700)  propofol (DIPRIVAN) infusion 15 mcg/kg/min (03/14/22 0700)     ROS:                                                                                                                                       Unable to obtain due to intubation.    Blood pressure 120/87, pulse 72, temperature 99.5 F (37.5 C), temperature source Axillary, resp. rate 20, height '5\' 1"'  (1.549 m), weight 58.5 kg, SpO2 100 %.   General Examination:                                                                                                       Physical Exam  HEENT-  Normocephalic Lungs- Intubated Extremities- Warm and well perfused  Neurological Examination Mental Status: Initially asleep on low-dose sedation, she awakens and follows simple motor commands, making eye contact with examiner and attempting to communicate by gesticulating.  Cranial Nerves: II: Personnel officer as he moves around the bed. PERRL.  III,IV, VI: EOMI while tracking. No forced gaze deviation or nystagmus.  V,VII: Corneal reflexes intact bilaterally. Some fine twitching movements around mouth and eyes is noted intermittently.  VIII: Alerts to voice IX,X: Intubated XI: Head is midline XII: Intubated Motor: Elevates BUE antigravity with some tremor noted. Resists examiner with 4/5 strength bilaterally Moves BLE to noxious plantar stimulation as well as to commands with antigravity movement and slight resistance.  Sensory: Nods yes to touch x 4 Deep Tendon Reflexes: 1-2+ and symmetric brachioradialis and patellae bilaterally Plantars: Right: downgoing   Left: downgoing Cerebellar: No gross ataxia.  Gait: Deferred   Lab Results: Basic Metabolic Panel: Recent Labs  Lab 03/12/22 0633 03/12/22 0732 03/12/22 1148 03/13/22 0411 03/13/22 6720 03/13/22 0654 03/13/22 2251 03/13/22 2331 03/14/22 0638  NA 145   < >  --  139 145 145 144 142 146*  K 3.3*   < >  --  4.6 3.0* 3.3* 3.1* 2.9* 4.1  CL 98  --   --  101 103 103  --  105 108  CO2 41*  --   --  30  --  31  --  30 30  GLUCOSE 158*  --   --  210* 138* 140*  --  163* 99  BUN 14  --   --  25* 24* 24*  --  13 14  CREATININE 0.81  --   --  1.29* 1.30* 1.38*  --  0.75 0.76  CALCIUM 8.9  --   --  8.9  --  9.5  --  8.5* 8.7*  MG 1.9  --   --  2.3  --   --   --   --   --   PHOS  --   --  2.4* 3.2  --   --   --   --   --    < > = values in this interval not displayed.    CBC: Recent Labs  Lab 03/11/22 1229 03/12/22 0052 03/12/22 8413 03/12/22 0732 03/12/22 1116 03/13/22 0613 03/13/22 2251 03/13/22 2331  WBC 4.4  --  4.3  --   --   --   --  13.4*  NEUTROABS  --   --  3.3  --   --   --   --   --   HGB 11.3*   < > 9.5* 9.2* 9.9* 8.5* 9.5* 9.1*  HCT 36.3   < > 29.9* 27.0* 29.0* 25.0* 28.0* 27.9*  MCV 88.5  --  89.3  --   --   --   --  88.3  PLT 226  --  202  --   --   --   --  152   < > = values in this interval not  displayed.    Cardiac Enzymes: Recent Labs  Lab 03/12/22 1102  CKTOTAL 34*    Lipid Panel: Recent Labs  Lab 03/14/22 0638  TRIG 80    Imaging: DG CHEST PORT 1 VIEW  Result Date: 03/13/2022 CLINICAL DATA:  Tube placement.  Shortness of breath. EXAM: PORTABLE CHEST 1 VIEW COMPARISON:  Chest x-ray 03/13/2022. FINDINGS: Endotracheal tube tip is 4 cm above the carina. Enteric tube tip is in the gastric body. Right upper extremity PICC terminates over the SVC. Multiple lines overlie the chest. There surgical clips in the right abdomen. Small right pleural effusion and right basilar opacities persist. Left lung is clear. There is no pneumothorax. Cardiomediastinal silhouette is stable and within normal limits. Osseous structures are unchanged. IMPRESSION: Adequate positioning of lines and tubes. Stable small right pleural effusion with right basilar atelectasis/airspace disease. Electronically Signed   By: Ronney Asters M.D.   On: 03/13/2022 22:33   DG CHEST PORT 1 VIEW  Result Date: 03/13/2022 CLINICAL DATA:  Confirm placement of ET tube EXAM: PORTABLE CHEST 1 VIEW COMPARISON:  Chest radiograph dated April 11, 2022 FINDINGS: ETT tip projects over the midthoracic trachea. Interval placement of right arm PICC with tip overlying the right atrium. Gastric decompression tube is partially visualized coursing below the diaphragm. Bibasilar atelectasis. Small right pleural effusion. No evidence of pneumothorax. IMPRESSION: 1. Interval placement of right arm PICC with tip in the right atrium. 2. ET tube tip projects over the midthoracic trachea. Electronically Signed   By: Yetta Glassman M.D.   On: 03/13/2022 09:38   [place] EKG SITE RITE  Result Date: 03/12/2022 If Site Rite image not attached, placement could not be confirmed due to current cardiac rhythm.  EEG adult  Result Date: 03/12/2022 Lora Havens, MD     03/12/2022 10:29 AM Patient Name: DIASHA CASTLEMAN MRN: [id number] Epilepsy  Attending: Lora Havens Referring Physician/Provider: Lestine Mount, PA-C Date: 03/12/2022 Duration: 25.24 mins Patient history: 71 year old female with altered mental status.  EEG to evaluate for seizure. Level of alertness: Awake, asleep AEDs during EEG study: versed Technical aspects: This EEG study was done with scalp electrodes positioned according to the 10-20 International system of electrode placement. Electrical activity was acquired  at a sampling rate of '500Hz'  and reviewed with a high frequency filter of '70Hz'  and a low frequency filter of '1Hz' . EEG data were recorded continuously and digitally stored. Description: No clear posterior dominant rhythm was seen.  Sleep was characterized by vertex waves, sleep spindles (12 to 14 Hz), maximal frontocentral region.  There is also 15 to 18 Hz beta activity distributed symmetrically and diffusely. Hyperventilation and photic stimulation were not performed.   ABNORMALITY - Excessive beta, generalized IMPRESSION: This study is within normal limits. The excessive beta activity seen in the background is most likely due to the effect of benzodiazepine and is a benign EEG pattern. No seizures or epileptiform discharges were seen throughout the recording. Priyanka [name]   CT ANGIO CHEST AORTA W/ & OR WO/CM & GATING ([place] ONLY)  Result Date: 03/12/2022 CLINICAL DATA:  Acute aortic syndrome suspected. Repeat chest CTA with cardiac gating. EXAM: CT ANGIOGRAPHY CHEST WITH CONTRAST WITH CARDIAC GATING TECHNIQUE: Multidetector CT imaging of the chest was performed using the standard protocol during bolus administration of intravenous contrast. Multiplanar CT image reconstructions and MIPs were obtained to evaluate the vascular anatomy. RADIATION DOSE REDUCTION: This exam was performed according to the departmental dose-optimization program which includes automated exposure control, adjustment of the mA and/or kV according to patient size and/or use of  iterative reconstruction technique. CONTRAST:  69m OMNIPAQUE IOHEXOL 350 MG/ML SOLN COMPARISON:  CTA chest 03/11/2022 FINDINGS: Cardiovascular: Ascending thoracic aorta is well visualized on this CTA examination except for 1 small area of motion on sequence 5 image 79. No evidence to suggest an aortic dissection. Ascending thoracic aorta measures up to 3.6 cm. Bovine arch with a common trunk for the left common carotid artery and brachiocephalic artery. Great vessels are patent. Bilateral vertebral arteries are patent with a dominant right vertebral artery. Normal caliber of the visualized abdominal aorta. Celiac trunk, SMA and bilateral renal arteries are widely patent. Limited evaluation of the pulmonary arteries due to the phase of contrast. Heart size is normal. No significant pericardial effusion. Mediastinum/Nodes: Thyroid tissue is heterogeneous. Subcentimeter (.9 cm) incidental right thyroid nodule. No follow-up imaging is recommended. Reference: J Am Coll Radiol. 2015 Feb;12(2): 143-50 No mediastinal or hilar lymphadenopathy. Nasogastric tube extends down the esophagus. Lungs/Pleura: Endotracheal tube is positioned just above the carina. Increased consolidation at both lung bases may represent increased atelectasis. No large pleural effusions. Again noted is volume loss in the right middle lobe. Small nodular densities along the left major fissure appear to be stable since the chest CT 06/16/2017 and compatible with benign findings. Upper Abdomen: Evidence for cholecystectomy. Limited evaluation of the liver parenchyma due to the arterial phase of contrast. There may be some underlying biliary dilatation which is poorly evaluated. Nasogastric tube is coiled in the stomach. Motion artifact in the upper abdomen limits evaluation. Musculoskeletal: No acute bone abnormality. Asymmetric left breast tissue compatible with recently biopsied area with surgical clip. Review of the MIP images confirms the above  findings. IMPRESSION: 1. No evidence for an aortic dissection. No evidence for a thoracic aortic aneurysm. 2. Increased volume loss at both lung bases. 3. Asymmetric left breast tissue. This corresponds with the recent mammogram and ultrasound findings. This area was recently biopsied. Electronically Signed   By: AMarkus DaftM.D.   On: 03/12/2022 09:11   CT HEAD WO CONTRAST (5MM)  Result Date: 03/12/2022 CLINICAL DATA:  Mental status change, unknown cause EXAM: CT HEAD WITHOUT CONTRAST TECHNIQUE: Contiguous axial images were obtained  from the base of the skull through the vertex without intravenous contrast. RADIATION DOSE REDUCTION: This exam was performed according to the departmental dose-optimization program which includes automated exposure control, adjustment of the mA and/or kV according to patient size and/or use of iterative reconstruction technique. COMPARISON:  None Available. FINDINGS: Brain: There is no acute intracranial hemorrhage, mass effect, or edema. Gray-white differentiation is preserved. There is no extra-axial fluid collection. Ventricles and sulci are within normal limits in size and configuration. Minimal patchy hypoattenuation in the supratentorial white matter is nonspecific may reflect minor chronic microvascular ischemic changes. Vascular: No hyperdense vessel.There is mild atherosclerotic calcification at the skull base. Skull: Calvarium is unremarkable. Sinuses/Orbits: No acute finding. Other: Mastoid air cells are clear. Retained secretions in the visualized nasopharynx. IMPRESSION: No acute intracranial abnormality. Electronically Signed   By: Macy Mis M.D.   On: 03/12/2022 08:21    Assessment: 71 year old female with acute onset of seizure-like activity yesterday night with associated bradycardia, diaphoresis and respiratory decompensation 1. Semiology described by teams that witnessed the event is most consistent with an epileptic seizure.  2. On exam there are fine  quivering and twitching movements around mouth and eyes intermittently. She will follow simple commands. Diffusely weak with upper extremity action tremor bilaterally.  3. Na, Ca and Mg unremarkable.    Recommendations: 1. Brain imaging when stable 2. LTM EEG in place. Official report pending.  3. Was loaded with 3000 mg IV Keppra yesterday night. Continue Keppra at 500 mg BID.  4. Has good renal function currently (improved from eGFR of 41 yesterday night).  5. Seizure precautions.   35 minutes spent in the neurological evaluation and management of this critically ill patient.   Electronically signed: Dr. Kerney Elbe 03/14/2022, 8:08 AM

## 2022-03-14 NOTE — Progress Notes (Signed)
eLink Physician-Brief Progress Note Patient Name: JYLLIAN HAYNIE DOB: 02-Feb-1951 MRN: [id number]   Date of Service  03/14/2022  HPI/Events of Note  Phos 1.6  eICU Interventions  Phos replaced per E-Link electrolyte replacement protocol.        Kerry Kass Lachina Salsberry 03/14/2022, 9:24 PM

## 2022-03-14 NOTE — Progress Notes (Signed)
Patient EEG D/C'd due to getting MRI and not having MRI leads. Patient had no skin breakdown and will need to be re hooked once back from MRI.

## 2022-03-14 NOTE — Progress Notes (Signed)
Nutrition Follow-up  DOCUMENTATION CODES:   Severe malnutrition in context of acute illness/injury  INTERVENTION:   Tube Feeds via NG:  Start Vital AF 1.2 @ 20 mL/hr and increase by 10 mL q8h to goal rate of 60 mL/hr (1440 mL/day) Provides 1728 kcal, 108 gm protein, and 1166 mL free water daily. Monitor magnesium, potassium, and phosphorus BID for at least 3 days, MD to replete as needed, as pt is at risk for refeeding syndrome given severe malnutrition.  Continue Multivitamin w/ minerals and Thiamine daily  NUTRITION DIAGNOSIS:   Severe Malnutrition related to acute illness as evidenced by severe muscle depletion, severe fat depletion. - Ongoing   GOAL:   Patient will meet greater than or equal to 90% of their needs - Ongoing  MONITOR:   Vent status, Labs, TF tolerance, I & O's  REASON FOR ASSESSMENT:   Ventilator, Malnutrition Screening Tool    ASSESSMENT:   Pt with PMH of HTN, orthostatic hypotension, anemia, arthritis, sickle cell trait, and recent weight loss and fatigue x 3 months, abd pain started on protonix now admitted with SOB and possible ascending aortic dissection.  5/15 - extubated in AM; reintubated that evening  Discussed with CCM NP, plan to start TF after pt returns from MRI this evening.   RN reports no signs of abdominal distention. Family states that pt denies any pain.   Patient is currently intubated and on ventilator support. Pt is awake. MV: 7.6 L/min Temp (24hrs), Avg:98.6 F (37 C), Min:97.6 F (36.4 C), Max:99.5 F (37.5 C) Continuous Medications Levophed  Medications reviewed and include: Colace, Zofran, MVI, Protonix, Thiamine Labs reviewed: Sodium 146  Diet Order:   Diet Order             Diet NPO time specified  Diet effective now                   EDUCATION NEEDS:   Not appropriate for education at this time  Skin:  Skin Assessment: Reviewed RN Assessment  Last BM:  Unknown  Height:   Ht Readings from Last  1 Encounters:  03/14/22 '5\' 1"'$  (1.549 m)    Weight:   Wt Readings from Last 1 Encounters:  03/14/22 58.5 kg   BMI:  Body mass index is 24.37 kg/m.  Estimated Nutritional Needs:   Kcal:  1600-1800  Protein:  80-100 grams  Fluid:  >1.6 L/day    Hermina Barters RD, LDN Clinical Dietitian See [facility] for contact information.

## 2022-03-15 ENCOUNTER — Inpatient Hospital Stay (HOSPITAL_COMMUNITY): Payer: Medicare Other

## 2022-03-15 DIAGNOSIS — R569 Unspecified convulsions: Secondary | ICD-10-CM | POA: Diagnosis not present

## 2022-03-15 DIAGNOSIS — E43 Unspecified severe protein-calorie malnutrition: Secondary | ICD-10-CM | POA: Diagnosis not present

## 2022-03-15 DIAGNOSIS — R0689 Other abnormalities of breathing: Secondary | ICD-10-CM | POA: Diagnosis not present

## 2022-03-15 DIAGNOSIS — G9340 Encephalopathy, unspecified: Secondary | ICD-10-CM | POA: Diagnosis not present

## 2022-03-15 LAB — COMPREHENSIVE METABOLIC PANEL
ALT: 18 U/L (ref 0–44)
AST: 14 U/L — ABNORMAL LOW (ref 15–41)
Albumin: 2.5 g/dL — ABNORMAL LOW (ref 3.5–5.0)
Alkaline Phosphatase: 49 U/L (ref 38–126)
Anion gap: 7 (ref 5–15)
BUN: 10 mg/dL (ref 8–23)
CO2: 28 mmol/L (ref 22–32)
Calcium: 8.1 mg/dL — ABNORMAL LOW (ref 8.9–10.3)
Chloride: 112 mmol/L — ABNORMAL HIGH (ref 98–111)
Creatinine, Ser: 0.75 mg/dL (ref 0.44–1.00)
GFR, Estimated: >60 mL/min (ref >60)
Glucose, Bld: 110 mg/dL — ABNORMAL HIGH (ref 70–99)
Potassium: 3.4 mmol/L — ABNORMAL LOW (ref 3.5–5.1)
Sodium: 147 mmol/L — ABNORMAL HIGH (ref 135–145)
Total Bilirubin: 1.7 mg/dL — ABNORMAL HIGH (ref 0.3–1.2)
Total Protein: 4.9 g/dL — ABNORMAL LOW (ref 6.5–8.1)

## 2022-03-15 LAB — GLUCOSE, CAPILLARY
Glucose-Capillary: 109 mg/dL — ABNORMAL HIGH (ref 70–99)
Glucose-Capillary: 129 mg/dL — ABNORMAL HIGH (ref 70–99)
Glucose-Capillary: 136 mg/dL — ABNORMAL HIGH (ref 70–99)
Glucose-Capillary: 138 mg/dL — ABNORMAL HIGH (ref 70–99)
Glucose-Capillary: 139 mg/dL — ABNORMAL HIGH (ref 70–99)
Glucose-Capillary: 77 mg/dL (ref 70–99)

## 2022-03-15 LAB — CBC
HCT: 23.8 % — ABNORMAL LOW (ref 36.0–46.0)
Hemoglobin: 7.8 g/dL — ABNORMAL LOW (ref 12.0–15.0)
MCH: 28.8 pg (ref 26.0–34.0)
MCHC: 32.8 g/dL (ref 30.0–36.0)
MCV: 87.8 fL (ref 80.0–100.0)
Platelets: 127 10*3/uL — ABNORMAL LOW (ref 150–400)
RBC: 2.71 MIL/uL — ABNORMAL LOW (ref 3.87–5.11)
RDW: 14.7 % (ref 11.5–15.5)
WBC: 10.9 10*3/uL — ABNORMAL HIGH (ref 4.0–10.5)
nRBC: 0 % (ref 0.0–0.2)

## 2022-03-15 LAB — PHOSPHORUS
Phosphorus: 5.8 mg/dL — ABNORMAL HIGH (ref 2.5–4.6)
Phosphorus: 4.2 mg/dL (ref 2.5–4.6)
Phosphorus: 2.8 mg/dL (ref 2.5–4.6)
Phosphorus: 2.7 mg/dL (ref 2.5–4.6)

## 2022-03-15 LAB — MAGNESIUM
Magnesium: 2 mg/dL (ref 1.7–2.4)
Magnesium: 1.8 mg/dL (ref 1.7–2.4)

## 2022-03-15 LAB — PROCALCITONIN: Procalcitonin: <0.1 ng/mL

## 2022-03-15 MED ORDER — SODIUM CHLORIDE 0.9 % IV SOLN
3.0000 g | Freq: Three times a day (TID) | INTRAVENOUS | Status: DC
  Administered 2022-03-15 – 2022-03-16 (×5): 3 g via INTRAVENOUS
  Filled 2022-03-15 (×5): qty 8

## 2022-03-15 MED ORDER — HEPARIN SODIUM (PORCINE) 5000 UNIT/ML IJ SOLN
5000.0000 [IU] | Freq: Three times a day (TID) | INTRAMUSCULAR | Status: DC
  Administered 2022-03-15 – 2022-03-31 (×47): 5000 [IU] via SUBCUTANEOUS
  Filled 2022-03-15 (×48): qty 1

## 2022-03-15 MED ORDER — ACETAMINOPHEN 160 MG/5ML PO SOLN
500.0000 mg | Freq: Four times a day (QID) | ORAL | Status: DC | PRN
Start: 2022-03-15 — End: 2022-03-15

## 2022-03-15 MED ORDER — ACETAMINOPHEN 160 MG/5ML PO SOLN
500.0000 mg | Freq: Four times a day (QID) | ORAL | Status: DC | PRN
  Administered 2022-03-15 – 2022-03-20 (×4): 500 mg
  Filled 2022-03-15 (×4): qty 20.3

## 2022-03-15 MED ORDER — MAGNESIUM SULFATE 2 GM/50ML IV SOLN
2.0000 g | Freq: Once | INTRAVENOUS | Status: AC
  Administered 2022-03-15: 2 g via INTRAVENOUS

## 2022-03-15 MED ORDER — POTASSIUM CHLORIDE 20 MEQ PO PACK
40.0000 meq | PACK | Freq: Once | ORAL | Status: AC
  Administered 2022-03-15: 40 meq
  Filled 2022-03-15: qty 2

## 2022-03-15 MED ORDER — FREE WATER
200.0000 mL | Freq: Four times a day (QID) | Status: DC
Start: 2022-03-15 — End: 2022-03-16
  Administered 2022-03-15 – 2022-03-16 (×4): 200 mL

## 2022-03-15 NOTE — Progress Notes (Signed)
Pharmacy Antibiotic Note  Sheila Fernandez is a 71 y.o. female admitted on 03/11/2022 with pneumonia.  Pharmacy has been consulted for Unasyn dosing.  Scr 0.75; Est CrCl ~ 53 ml/min  Plan: Unasyn  Height: '5\' 1"'$  (154.9 cm) Weight: 59.7 kg (131 lb 9.8 oz) IBW/kg (Calculated) : 47.8  Temp (24hrs), Avg:99.1 F (37.3 C), Min:98.6 F (37 C), Max:99.6 F (37.6 C)  Recent Labs  Lab 03/11/22 1229 03/12/22 0030 03/12/22 4332 03/12/22 0708 03/12/22 1102 03/13/22 0411 03/13/22 9518 03/13/22 0654 03/13/22 2110 03/13/22 2331 03/14/22 0638 03/15/22 0442  WBC 4.4  --  4.3  --   --   --   --   --   --  13.4*  --  10.9*  CREATININE 0.64  --  0.81  --   --    < > 1.30* 1.38*  --  0.75 0.76 0.75  LATICACIDVEN  --  0.9  --  1.3 2.6*  --   --   --  1.0 2.0*  --   --    < > = values in this interval not displayed.    Estimated Creatinine Clearance: 53.6 mL/min (by C-G formula based on SCr of 0.75 mg/dL).    Allergies  Allergen Reactions   Codeine Nausea And Vomiting    Antimicrobials this admission: Unasyn 6/17 >   Dose adjustments this admission:  Microbiology results: 6/14 Resp Cx > normal flora 6/14 AFB pending 6/13 MRSA pcr neg  Thank you for allowing pharmacy to be a part of this patient's care.  [place] Crane, Roylene Reason, BCCP Clinical Pharmacist  03/15/2022 10:58 AM   [facility] pharmacy phone numbers are listed on [email or web address]

## 2022-03-15 NOTE — Progress Notes (Signed)
LTM EEG discontinued - no skin breakdown at unhook.   

## 2022-03-15 NOTE — Progress Notes (Addendum)
Neurology Progress Note  Brief HPI:  Sheila Fernandez is a 71 y.o. female with a PMHx of adenomyosis, sickle cell trait, anemia, arthritis, HTN, recurrent iritis, orthostatic hypotension and uterine fibroids who presented to the hospital with abdominal pain, chest pain, acute respiratory failure with hypercarbia and acute metabolic encephalopathy. She had an episode of decreased LOC, possible tongue chewing, bradycardia, agonal respirations, and diaphoresis on 6/15 overnight.   Subjective: Patient remains intubated, but is awake, in bed, able to communicate appropriately with writing. She was able to tell me that she has had problems with her heart rate and blood pressure dropping when she stands and she has seen cardiology for this in the past. She denies a history of seizure activity, headaches, numbness, or tingling sensation.   Exam: Vitals:   03/15/22 0843 03/15/22 0900  BP: (!) 80/63 (!) 83/65  Pulse: (!) 104 97  Resp: (!) 31 (!) 23  Temp:    SpO2: 98% 91%   Gen: In bed, intubated Resp: ventilated Abd: soft, nt  Neuro: MS: Aox4, writing is clear, legible. Unable to speak due to ETT.  CN: Visual Fields are full. PERRL, EOMI without ptosis or diploplia. Facial sensation is symmetric to temperature, Facial movement appears symmetric, Hearing is intact to voice, Shoulder shrug is symmetric. Tongue protrudes midline without atrophy or fasciculations.  Motor: 5/5 strength in all extremities Sensory:equal and intact in all extremities DTR:2+ Cerebellar: No ataxia on exam. FNF intact   Pertinent Labs: Troponin- 123 -> peak 132 B12 - 532 Sed 13 T3 1.8 TSH 1.131 CEA 1.4 CRP 1.6 [place] spotted fever- pending B1- pending   03/13/2022  IgG P93 Ab. Present !   IgG P66 Ab. Absent   IgG P58 Ab. Present !   IgG P45 Ab. Absent   IgG P41 Ab. Present !   IgG P39 Ab. Absent   IgG P30 Ab. Absent   IgG P28 Ab. Absent   IgG P23 Ab. Absent   IgG P18 Ab. Absent   Lyme IgG Wb  Negative   IgM P41 Ab. Absent   IgM P39 Ab. Absent   IgM P23 Ab. Absent   Lyme IgM Wb Negative     MRI Brain 1. No acute intracranial abnormality or significant interval change. 2. 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 3. Stable diffuse white matter disease. This likely reflects the sequela of chronic microvascular ischemia.  4. Postcontrast images demonstrate no pathologic enhancement. MRI C spine 1. No significant change in appearance of the cervical spine with and without contrast. 2. Multilevel foraminal narrowing as described. 3. Mild foraminal narrowing bilaterally at C2-3. 4. Severe left and moderate right foraminal stenosis at C3-4. 5. Moderate foraminal narrowing at C4-5 is worse left than right. 6. Moderate right and mild left foraminal narrowing at C5-6. 7. Mild left foraminal narrowing at C6-7.   Impression: symptomatic bradycardia vs seizure- discussed with PCCM, they will consider cardiology/EP consult as well. They are on board with [place] discontinuing LTM today. They have also sent out additional laboratory testing.   Recommendations: 1) discontinue LTM today 2) continue keppra '500mg'$  BID 3) minimal sedation   Patient seen and examined by NP/APP with MD. MD to update note as needed.   Janine Ores, DNP, FNP-BC [facility] Pager: [phone]   Attending Attestation:  Patient seen, examined, labs,vitals and notes reviewed. Discussed plan with Charlean Merl, NP and agree with assessment and plan as documented above. I have independently reviewed the chart, obtained  history, review of systems and examined the patient.  Electronically signed by:  Lynnae Sandhoff, MD Page: [phone] 03/15/2022, 11:31 AM

## 2022-03-15 NOTE — Progress Notes (Signed)
LTM EEG re-hook, up and running - no initial skin breakdown - push button tested - neuro notified. [facility] monitoring.

## 2022-03-15 NOTE — Progress Notes (Addendum)
NAME:  Sheila Fernandez, MRN:  [id number], DOB:  Oct 19, 1950, LOS: 4 ADMISSION DATE:  03/11/2022, CONSULTATION DATE:  03/11/22 REFERRING MD:  Darl Householder, CHIEF COMPLAINT:  Chest pain, abdominal pain  History of Present Illness:  71 yo woman with chest pain, abdominal pain, SOB. Sudden onset epigastric pain radiating to back since yesterday.  SOB.  BP elevated always, missed her bp meds this am. CT chest several months ago, mass vs atelectasis.  Here with mid abdominal pain since yesterday.  Low back pain.  SOB, fatigue x 3 months Fatigue and weight loss x 6 months.  Recent abdominal pain, started on protonix    Admitted to ICU, CT abdomen/pelvis negative for AAS, initially on esmolol infusion then required Levophed for hypotension and intubation for encephalopathy. Spot EEG and head CT were negative.  She was extubated 6/14 and transferred out of the ICU.    Overnight on 6/15, pt was ambulating and conversing with family when she suddenly became restless and then altered and had an episode of bradycardia to the 40's.  A code blue was called, but pt never lost pulses, she did require bagging for a short time.  Her HR improved without medications and she was responding to some commands, but had upwardly deviated gaze and flexion of the R arm.  She was transferred to the ICU and re-intubated and loaded with Keppra   Pertinent  Medical History  HTN, Orthostatic hypotension, Anemia, Arthritis, Iritis, Recurrent Sickle cell trait  Echocardiogram 12/10/2021: Left ventricle cavity is normal in size and wall thickness. Normal global wall motion. Normal LV systolic function with EF 61%. Doppler evidence of grade I (impaired) diastolic dysfunction, normal LAP. Mild mitral valve stenosis. Mild tricuspid regurgitation. IVC not seen. No significant change compared to previous study in 2019.  Lexiscan Tetrofosmin stress test 02/01/2022:Normal myocardial perfusion. Stress LVEF 57%.Low risk  study.  Significant Hospital Events: Including procedures, antibiotic start and stop dates in addition to other pertinent events   6/13 admitted to ICU for esmolol gtt with question of possible dissection 6/14 hypotensive, AMS on esmolol 300. Intubated. Starting NE. CT H neg. CTA no AAS. BAL: cytology neg.  Extubate 6/15 6/15-16 night Episode of AMS and bradycardia, concern for sz, re-intubated and loaded with Keppra 6/16 still no sz on EEG. MRI c spine and brain: and without contrast. Mild foraminal narrowing bilaterally at C2-3.Severe left and moderate right foraminal stenosis at C3-4.Moderate foraminal narrowing at C4-5 is worse left than right.  Moderate right and mild left foraminal narrowing at C5-6.Mild left foraminal narrowing at C6-7. No sig change compared w/ MRI 4/11; No acute intracranial abnormality or significant interval change. 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 This likely reflects the sequela of chronic microvascular ischemia.  Interim History / Subjective:   Failed sbt  Objective   Blood pressure 107/69, pulse 74, temperature 99.6 F (37.6 C), temperature source Oral, resp. rate 20, height _0  (1.549 m), weight 59.7 kg, SpO2 100 %.    Vent Mode: PRVC FiO2 (%):  [40 %] 40 % Set Rate:  [0 bmp-20 bmp] 20 bmp Vt Set:  [380 mL] 380 mL PEEP:  [5 cmH20] 5 cmH20 Plateau Pressure:  [16 cmH20-18 cmH20] 17 cmH20   Intake/Output Summary (Last 24 hours) at 03/15/2022 0742 Last data filed at 03/15/2022 0700 Gross per 24 hour  Intake 1481.99 ml  Output 2150 ml  Net -668.01 ml   Filed Weights   03/13/22 2125 03/14/22  5784 03/15/22 0600  Weight: 58.5 kg 58.5 kg 59.7 kg   General 71 year old female resting in bed. She is in no distress HENT NCAT no JVD MMM Pulm scattered rhonchi VTs only 150 ml w/ accessory use during PSV attempt Card rrr Abd soft  Ext warm  Neuro awake and appropriate. No sig weakness Gu cl  yellow    Resolved Hospital Problem list   AKI   Assessment & Plan:    Symptomatic Bradycardia and Hypotension: ?Autonomic dysfunction  -MRI not changed. Not sure that the degree of C spine narrowing explains her symptoms but does have severe disease of c3 to c 4. There was  question of POTS outpt but also had been worked up for orthostatic hypotension. . Bradycardia occurred during ambulation this time.  Plan Cont tele  Holding any rate BB Cont NE for MAP > 65 Await neuro review of MRI. Read as no sig change so seems unlikely to be r/t c spine Cards eval post-extubation. does she need EP?   Acute encephalopathy , Suspected acute seizure (?post-ictal vs ?low flow state)  -ltm neg for seizure thus far w/ exception of possible focal event noted 6/16 (twitching not picked up on scalp eeg).  -CT head neg. Neuro feeling less likely seizure. ? 2/2 hypoperfusoin from bradycardia  Plan Cont keppra LTM per Neuro   Acute respiratory failure requiring reintubation/ endotracheal tube in place  -secondary to the above. -? If she has an underlying central sleep apnea based on patterns on vent from vent wean process from first intubation Plan Cont full vent support VAP bundle  Repeat CXR Sputum culture  PCT    Fluid and electrolyte imbalance: hypernatremia, hypokalemia  Plan  Free water replacement KDUR  Am chem   Cervical radiculopathy  -recent EMG -- Acute on chronic C8 radiculopathy (severe), chornic C5 radiculopathy (mild-mod) -- affecting BUE  Carpal tunnel.   Plan F/u out pt Additional recs per neuro   Anemia, chronic - stable  Hx sickle cell trait  Plan Trend cbc  Constellation of symptoms and problems:  Gastric pain , Cough, DOE, Chest pain, Weakness, Weight loss, Severe malnutrition:  From family, there is a myriad of outpt sx and a general feeling of something not being right for the past several months. Pt noticed sx either onset or worsening after a bite on her  neck, which she thinks may have been a tick but is not sure. sx sound somewhat like they could fall under an AI/rheum/neuro umbrella but so vague at this point it is hard to decipher.  ->lyme disease western blot w/ several IgG Ab present: 93, 58, 41 (Borrelia bands)-->could this be a unifying dx?  Plan F/u autoimmune markers F/u micronutrient markers Cont PPI F/u cancer markers F/u lyme disease serology     Best Practice (right click and "Reselect all SmartList Selections" daily)   Diet/type: tubefeeds DVT prophylaxis: SCD GI prophylaxis: PPI Lines: Central line Foley:  N/A Code Status:  full code Last date of multidisciplinary goals of care discussion [ 6/16 son, 2 daughters updated at bedside 6/17) ]  My cct 68 min  Erick Colace ACNP-BC [place] Pager # [phone] OR # [phone] if no answer   I agree with the Advanced Practitioner's note, impression, and recommendations as outlined. I have taken an independent interval history, reviewed the chart and examined the patient.  My medical decision making is as follows:   Subjective: Overnight she is now more awake alert  and following commands. She was having low tidal volumes  Objective: Vitals:   03/15/22 1115 03/15/22 1130  BP: (!) 84/57 (!) 117/91  Pulse: 75 83  Resp: 20 16  Temp:    SpO2: 100% 100%    Gen:      Intubated, sedated, acutely ill appearing HEENT:  ETT to vent Lungs:    sounds of mechanical ventilation auscultated no wheeze CV:         RRR no mrg Abd:      + bowel sounds; soft, non-tender; no palpable masses, no distension Ext:    No edema Skin:      Warm and dry; no rashes Neuro:   sedated, RASS 0 to -1, moves all 4 extremities   Labs/Imaging: MRI unremarkable except for chronic microvascular changes  Chest xray shows RML and RLL aspiration  Assessment and Plan: Acute encephalopathy - now improved Possible Aspiration PNA Acute hypoxemic respiratory  failure Hypernatremia Hypokalemia Chronic anemia   Will maintain her vent support today. Suspect her worsening chest xray consistent with aspiration is impairing this. Maintain minimal sedation as tolerated. Will d/c LTM for EEG since no evidence of seizures. I reviewed her ekg which does not show any conduction abnormalities so I think lyme would be less likely- her igG was positive and IgM negative. I wonder if she is having dysautonomia episodes at home and that could be to blame. Could she need tilt table test? Probably this will be an outpatient work up once we can stabilize her from this hospitalization. Replace electrolytes. Free H20 flushes.   The patient is critically ill due to respiratory failure with multiple organ systems failure and requires high complexity decision making for assessment and support, frequent evaluation and titration of therapies, application of advanced monitoring technologies and extensive interpretation of multiple databases.   Critical Care Time devoted to patient care services described in this note is 35 minutes. This time reflects time of care of this [place] . This critical care time does not reflect separately billable procedures or procedure time, teaching time and supervisory time of PA/NP/Med student/Med Resident etc but could involve care discussion time.  Spero Geralds [facility] 03/15/2022 11:46 AM  Pager: see AMION  If no response to pager, please call critical care on call (see AMION) until 7pm After 7:00 pm call Elink

## 2022-03-15 NOTE — Procedures (Addendum)
Patient Name: SHADI LARNER  MRN: [id number]  Epilepsy Attending: Lora Havens  Referring Physician/Provider: Gleason, Otilio Carpen, PA-C  Duration: 03/14/2022 2245 to 03/15/2022 1111   Patient history: 71 year old female with altered mental status.  EEG to evaluate for seizure.   Level of alertness: Awake, asleep   AEDs during EEG study: LEV   Technical aspects: This EEG study was done with scalp electrodes positioned according to the 10-20 International system of electrode placement. Electrical activity was acquired at a sampling rate of '500Hz'$  and reviewed with a high frequency filter of '70Hz'$  and a low frequency filter of '1Hz'$ . EEG data were recorded continuously and digitally stored.    Description: The posterior dominant rhythm consists of 8 Hz activity of moderate voltage (25-35 uV) seen predominantly in posterior head regions, symmetric and reactive to eye opening and eye closing. Sleep was characterized by vertex waves, sleep spindles (12 to 14 Hz), maximal frontocentral region. Hyperventilation and photic stimulation were not performed.     IMPRESSION: This study is within normal limits.No seizures or epileptiform discharges were seen throughout the recording.   Demeka Sutter [name]

## 2022-03-15 NOTE — Progress Notes (Signed)
[facility] ADULT ICU REPLACEMENT PROTOCOL   The patient does apply for the [facility] Adult ICU Electrolyte Replacment Protocol based on the criteria listed below:   1.Exclusion criteria: TCTS patients, ECMO patients, and Dialysis patients 2. Is GFR >/= 30 ml/min? Yes.    Patient's GFR today is >60 3. Is SCr </= 2? Yes.   Patient's SCr is 0.75 mg/dL 4. Did SCr increase >/= 0.5 in 24 hours? No. 5.Pt's weight >40kg  Yes.   6. Abnormal electrolyte(s): K+ 3.4  7. Electrolytes replaced per protocol 8.  Call MD STAT for K+ </= 2.5, Phos </= 1, or Mag </= 1 Physician:  n/a  Sheila Fernandez 03/15/2022 6:37 AM

## 2022-03-16 ENCOUNTER — Inpatient Hospital Stay (HOSPITAL_COMMUNITY): Payer: Medicare Other

## 2022-03-16 DIAGNOSIS — G909 Disorder of the autonomic nervous system, unspecified: Secondary | ICD-10-CM

## 2022-03-16 DIAGNOSIS — J9621 Acute and chronic respiratory failure with hypoxia: Secondary | ICD-10-CM | POA: Diagnosis not present

## 2022-03-16 DIAGNOSIS — J9601 Acute respiratory failure with hypoxia: Secondary | ICD-10-CM | POA: Diagnosis not present

## 2022-03-16 DIAGNOSIS — I951 Orthostatic hypotension: Secondary | ICD-10-CM | POA: Diagnosis not present

## 2022-03-16 DIAGNOSIS — G9340 Encephalopathy, unspecified: Secondary | ICD-10-CM | POA: Diagnosis not present

## 2022-03-16 DIAGNOSIS — I71019 Dissection of thoracic aorta, unspecified: Secondary | ICD-10-CM | POA: Diagnosis not present

## 2022-03-16 DIAGNOSIS — E43 Unspecified severe protein-calorie malnutrition: Secondary | ICD-10-CM | POA: Diagnosis not present

## 2022-03-16 DIAGNOSIS — R001 Bradycardia, unspecified: Secondary | ICD-10-CM

## 2022-03-16 LAB — MAGNESIUM: Magnesium: 2.2 mg/dL (ref 1.7–2.4)

## 2022-03-16 LAB — CBC
HCT: 21 % — ABNORMAL LOW (ref 36.0–46.0)
HCT: 22.5 % — ABNORMAL LOW (ref 36.0–46.0)
Hemoglobin: 6.6 g/dL — CL (ref 12.0–15.0)
Hemoglobin: 7.1 g/dL — ABNORMAL LOW (ref 12.0–15.0)
MCH: 28.1 pg (ref 26.0–34.0)
MCH: 28.1 pg (ref 26.0–34.0)
MCHC: 31.4 g/dL (ref 30.0–36.0)
MCHC: 31.6 g/dL (ref 30.0–36.0)
MCV: 89.4 fL (ref 80.0–100.0)
MCV: 88.9 fL (ref 80.0–100.0)
Platelets: 112 10*3/uL — ABNORMAL LOW (ref 150–400)
Platelets: 129 10*3/uL — ABNORMAL LOW (ref 150–400)
RBC: 2.35 MIL/uL — ABNORMAL LOW (ref 3.87–5.11)
RBC: 2.53 MIL/uL — ABNORMAL LOW (ref 3.87–5.11)
RDW: 14.7 % (ref 11.5–15.5)
RDW: 14.5 % (ref 11.5–15.5)
WBC: 10.6 10*3/uL — ABNORMAL HIGH (ref 4.0–10.5)
WBC: 10.3 10*3/uL (ref 4.0–10.5)
nRBC: 0 % (ref 0.0–0.2)
nRBC: 0 % (ref 0.0–0.2)

## 2022-03-16 LAB — GLUCOSE, CAPILLARY
Glucose-Capillary: 110 mg/dL — ABNORMAL HIGH (ref 70–99)
Glucose-Capillary: 116 mg/dL — ABNORMAL HIGH (ref 70–99)
Glucose-Capillary: 119 mg/dL — ABNORMAL HIGH (ref 70–99)
Glucose-Capillary: 134 mg/dL — ABNORMAL HIGH (ref 70–99)
Glucose-Capillary: 136 mg/dL — ABNORMAL HIGH (ref 70–99)
Glucose-Capillary: 152 mg/dL — ABNORMAL HIGH (ref 70–99)
Glucose-Capillary: 202 mg/dL — ABNORMAL HIGH (ref 70–99)

## 2022-03-16 LAB — COMPREHENSIVE METABOLIC PANEL
ALT: 16 U/L (ref 0–44)
AST: 12 U/L — ABNORMAL LOW (ref 15–41)
Albumin: 2.1 g/dL — ABNORMAL LOW (ref 3.5–5.0)
Alkaline Phosphatase: 41 U/L (ref 38–126)
Anion gap: 7 (ref 5–15)
BUN: 15 mg/dL (ref 8–23)
CO2: 26 mmol/L (ref 22–32)
Calcium: 7.8 mg/dL — ABNORMAL LOW (ref 8.9–10.3)
Chloride: 113 mmol/L — ABNORMAL HIGH (ref 98–111)
Creatinine, Ser: 0.85 mg/dL (ref 0.44–1.00)
GFR, Estimated: >60 mL/min (ref >60)
Glucose, Bld: 204 mg/dL — ABNORMAL HIGH (ref 70–99)
Potassium: 3.4 mmol/L — ABNORMAL LOW (ref 3.5–5.1)
Sodium: 146 mmol/L — ABNORMAL HIGH (ref 135–145)
Total Bilirubin: 1.3 mg/dL — ABNORMAL HIGH (ref 0.3–1.2)
Total Protein: 4.7 g/dL — ABNORMAL LOW (ref 6.5–8.1)

## 2022-03-16 LAB — ABO/RH: ABO/RH(D): O NEG

## 2022-03-16 LAB — PROCALCITONIN: Procalcitonin: <0.1 ng/mL

## 2022-03-16 LAB — PHOSPHORUS
Phosphorus: 2.6 mg/dL (ref 2.5–4.6)
Phosphorus: 2.4 mg/dL — ABNORMAL LOW (ref 2.5–4.6)
Phosphorus: 3 mg/dL (ref 2.5–4.6)
Phosphorus: 2.7 mg/dL (ref 2.5–4.6)

## 2022-03-16 LAB — ROCKY MTN SPOTTED FVR ABS PNL(IGG+IGM)
RMSF IgG: NEGATIVE
RMSF IgM: 0.45 index (ref 0.00–0.89)

## 2022-03-16 LAB — PREPARE RBC (CROSSMATCH)

## 2022-03-16 LAB — VITAMIN B1: Vitamin B1 (Thiamine): 67.6 nmol/L (ref 66.5–200.0)

## 2022-03-16 LAB — OCCULT BLOOD X 1 CARD TO LAB, STOOL: Fecal Occult Bld: NEGATIVE

## 2022-03-16 MED ORDER — SODIUM CHLORIDE 0.9% IV SOLUTION: Freq: Once | INTRAVENOUS | Status: DC

## 2022-03-16 MED ORDER — PIPERACILLIN-TAZOBACTAM 3.375 G IVPB
3.3750 g | Freq: Three times a day (TID) | INTRAVENOUS | Status: AC
  Administered 2022-03-16 – 2022-03-23 (×22): 3.375 g via INTRAVENOUS
  Filled 2022-03-16 (×22): qty 50

## 2022-03-16 MED ORDER — FREE WATER
200.0000 mL | Status: DC
  Administered 2022-03-16 – 2022-03-20 (×23): 200 mL

## 2022-03-16 MED ORDER — FLEET ENEMA 7-19 GM/118ML RE ENEM
1.0000 | ENEMA | Freq: Once | RECTAL | Status: AC
Start: 2022-03-16 — End: 2022-03-16
  Administered 2022-03-16: 1 via RECTAL
  Filled 2022-03-16: qty 1

## 2022-03-16 MED ORDER — POTASSIUM CHLORIDE 20 MEQ PO PACK
40.0000 meq | PACK | Freq: Once | ORAL | Status: AC
  Administered 2022-03-16: 40 meq
  Filled 2022-03-16: qty 2

## 2022-03-16 MED ORDER — FUROSEMIDE 10 MG/ML IJ SOLN
40.0000 mg | Freq: Once | INTRAMUSCULAR | Status: AC
  Administered 2022-03-16: 40 mg via INTRAVENOUS
  Filled 2022-03-16: qty 4

## 2022-03-16 NOTE — Progress Notes (Signed)
eLink Physician-Brief Progress Note Patient Name: Sheila Fernandez DOB: 09-18-1951 MRN: [id number]   Date of Service  03/16/2022  HPI/Events of Note  71 y/o F a/w CP, SOB, fatigue, & HTN.  CT c/a/p was concerning for type A dissecting thoracic aorta.  Transferred to [facility], CVTS consulted.  On esmolol gtt  6/14  - became Unresponsive, brady 50s, SBP 60s, suspected seizure vs CVA, felt that BP lowering may have contributed,  -> neo gtt started, stat CTH, EEG, intubated.    CTA c/w artifact, but did not explicitly show aortic or thoracic dissection.    6/15 - extubated. Txf to 4E, then developed AMS, bradycardia to 20's and agonal breathing, code blue called, Intubated and txf back to [facility].   6/16 MRI c spine and brain c/w foraminal narrowing bilaterally at C2-3.  6/18 Hgb 6.6 (down from 7.8) > PRBC x 1 ordered, eLink called       eICU Interventions  - patient appears stable with normal HR and stable BP  - concern that this may represent aortic dissection with propagation -note she also had sickle cell trait and chronic anemia , and has been getting frequent blood draws.  -for now will transfuse 1 unit PRBC, f/u CBC, if still dropping or not responding appropriately will repeat CTA chest/ab/pelvis to evaluate         Ryane Konieczny N Keeva Reisen 03/16/2022, 4:26 AM

## 2022-03-16 NOTE — Progress Notes (Signed)
NAME:  Sheila Fernandez, MRN:  [id number], DOB:  [birth year], LOS: 5 ADMISSION DATE:  03/11/2022, CONSULTATION DATE:  03/11/22 REFERRING MD:  Darl Householder, CHIEF COMPLAINT:  Chest pain, abdominal pain  History of Present Illness:  71 yo woman with chest pain, abdominal pain, SOB. Sudden onset epigastric pain radiating to back since yesterday.  SOB.  BP elevated always, missed her bp meds this am. CT chest several months ago, mass vs atelectasis.  Here with mid abdominal pain since yesterday.  Low back pain.  SOB, fatigue x 3 months Fatigue and weight loss x 6 months.  Recent abdominal pain, started on protonix    Admitted to ICU, CT abdomen/pelvis negative for AAS, initially on esmolol infusion then required Levophed for hypotension and intubation for encephalopathy. Spot EEG and head CT were negative.  She was extubated 6/14 and transferred out of the ICU.    Overnight on 6/15, pt was ambulating and conversing with family when she suddenly became restless and then altered and had an episode of bradycardia to the 40's.  A code blue was called, but pt never lost pulses, she did require bagging for a short time.  Her HR improved without medications and she was responding to some commands, but had upwardly deviated gaze and flexion of the R arm.  She was transferred to the ICU and re-intubated and loaded with Keppra   Pertinent  Medical History  HTN, Orthostatic hypotension, Anemia, Arthritis, Iritis, Recurrent Sickle cell trait  Echocardiogram 12/10/2021: Left ventricle cavity is normal in size and wall thickness. Normal global wall motion. Normal LV systolic function with EF 61%. Doppler evidence of grade I (impaired) diastolic dysfunction, normal LAP. Mild mitral valve stenosis. Mild tricuspid regurgitation. IVC not seen. No significant change compared to previous study in 2019.  Lexiscan Tetrofosmin stress test 02/01/2022:Normal myocardial perfusion. Stress LVEF 57%.Low risk  study.  Significant Hospital Events: Including procedures, antibiotic start and stop dates in addition to other pertinent events   6/13 admitted to ICU for esmolol gtt with question of possible dissection 6/14 hypotensive, AMS on esmolol 300. Intubated. Starting NE. CT H neg. CTA no AAS. BAL: cytology neg.  Extubate 6/15 6/15-16 night Episode of AMS and bradycardia, concern for sz, re-intubated and loaded with Keppra 6/16 still no sz on EEG. MRI c spine and brain: and without contrast. Mild foraminal narrowing bilaterally at C2-3.Severe left and moderate right foraminal stenosis at C3-4.Moderate foraminal narrowing at C4-5 is worse left than right.  Moderate right and mild left foraminal narrowing at C5-6.Mild left foraminal narrowing at C6-7. No sig change compared w/ MRI 4/11; No acute intracranial abnormality or significant interval change. 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 This likely reflects the sequela of chronic microvascular ischemia. 4/17 failed SBT attempt. PCXR w/ right sided PNA. Cultures sent. Unasyn started.    Interim History / Subjective:  Remains weak, failed spontaneous breathing trial this morning.  Objective   Blood pressure 113/70, pulse 69, temperature 99.4 F (37.4 C), temperature source Axillary, resp. rate 20, height _0  (1.549 m), weight 59.7 kg, SpO2 100 %.    Vent Mode: PRVC FiO2 (%):  [40 %] 40 % Set Rate:  [20 bmp] 20 bmp Vt Set:  [380 mL] 380 mL PEEP:  [5 cmH20] 5 cmH20 Pressure Support:  [12 cmH20] 12 cmH20 Plateau Pressure:  [16 cmH20-21 cmH20] 16 cmH20   Intake/Output Summary (Last 24 hours) at 03/16/2022 0726 Last data filed at 03/16/2022  0700 Gross per 24 hour  Intake 2184.41 ml  Output 1075 ml  Net 1109.41 ml   Filed Weights   03/13/22 2125 03/14/22 0545 03/15/22 0600  Weight: 58.5 kg 58.5 kg 59.7 kg   General: 71 year old female patient remains on full ventilatory support after  failing spontaneous breathing trial HEENT normocephalic atraumatic no jugular venous distention orally intubated Pulmonary: Decreased bases scattered rhonchi no accessory use Cardiac: Regular rate and rhythm Abdomen: Soft nontender Extremities: Warm dry Neuro: Generalized weakness but following commands without focal deficits GU clear yellow.   Resolved Hospital Problem list   AKI   Assessment & Plan:    Symptomatic Bradycardia and Hypotension: ?Autonomic dysfunction  There was  question of POTS outpt but also had been worked up for orthostatic hypotension. . Bradycardia occurred during ambulation this time. Could this all be rhythm related in general? ? Need ppm?  Plan Tele Holding BB NE for MAP > 65 (currently off)  Cards eval (sees [facility]) s/p extubation maybe she needs EP eval as well   Acute encephalopathy , Suspected acute seizure (?post-ictal vs ?low flow state)  -ltm neg for seizure thus far w/ exception of possible focal event noted 6/16 (twitching not picked up on scalp eeg).  -CT head neg. Neuro feeling less likely seizure. ? 2/2 hypoperfusoin from bradycardia  Plan Keppra Serial neuro checks  Acute respiratory failure requiring reintubation c/b probable aspiration PNA -secondary to the above. -? If she has an underlying central sleep  -cultures pending, initial PCT neg Plan Day 2 unasyn VAP bundle  Fluid and electrolyte imbalance: hypernatremia, hypokalemia  Plan  Increase free water to q 4 Replace K & recheck  Cervical radiculopathy  -recent EMG -- Acute on chronic C8 radiculopathy (severe), chornic C5 radiculopathy (mild-mod) -- affecting BUE  Carpal tunnel.   Plan F/u outpt  Additional recs per neuro   Anemia, chronic - stable  Hx sickle cell trait; hgb down to 6.6  Plan 1 unit PRBC Am cbc Send FOB  Constipation Plan Fleet enema   Hyperglycemia Plan Cont to check cbgs Might need to add ssi   Constellation of symptoms and problems:   Gastric pain , Cough, DOE, Chest pain, Weakness, Weight loss, Severe malnutrition:  From family, there is a myriad of outpt sx and a general feeling of something not being right for the past several months. Pt noticed sx either onset or worsening after a bite on her neck, which she thinks may have been a tick but is not sure. sx sound somewhat like they could fall under an AI/rheum/neuro umbrella but so vague at this point it is hard to decipher.  ->lyme disease western blot w/ several IgG Ab present: 93, 58, 41 (Borrelia bands) Plan F/u autoimmune markers Cont PPI F/u Lyme disease studies      Best Practice (right click and "Reselect all SmartList Selections" daily)   Diet/type: tubefeeds DVT prophylaxis: SCD GI prophylaxis: PPI Lines: Central line Foley:  N/A Code Status:  full code Last date of multidisciplinary goals of care discussion [ 6/16 son, 2 daughters updated at bedside 6/17) ]  My cct  87 min  Erick Colace ACNP-BC [place] Pager # [phone] OR # [phone] if no answer

## 2022-03-16 NOTE — Progress Notes (Signed)
Sputum culture collected and sent to the lab. 

## 2022-03-16 NOTE — Progress Notes (Signed)
Neurology Progress Note  Brief HPI:  Sheila Fernandez is a 71 y.o. female with a PMHx of adenomyosis, sickle cell trait, anemia, arthritis, HTN, recurrent iritis, orthostatic hypotension and uterine fibroids who presented to the hospital with abdominal pain, chest pain, acute respiratory failure with hypercarbia and acute metabolic encephalopathy. She had an episode of decreased LOC, possible tongue chewing, bradycardia, agonal respirations, and diaphoresis on 6/15 overnight.   Subjective: Patient remains intubated, but is awake, in bed, able to communicate appropriately with writing. She was able to tell me that she has had problems with her heart rate and blood pressure dropping when she stands and she has seen cardiology for this in the past. She denies a history of seizure activity, headaches, numbness, or tingling sensation.  Received 1 PRBC overnight.   Exam: Vitals:   03/16/22 1130 03/16/22 1210  BP: 123/79 131/90  Pulse: 83 89  Resp: 18 (!) 25  Temp:    SpO2: 98% 97%   Gen: In bed, intubated Resp: ventilated Abd: soft, nt  Neuro: MS: Aox4, writing is clear, legible. Unable to speak due to ETT.  CN: Visual Fields are full. PERRL, EOMI without ptosis or diploplia. Facial sensation is symmetric to temperature, Facial movement appears symmetric, Hearing is intact to voice, Shoulder shrug is symmetric. Tongue protrudes midline without atrophy or fasciculations.  Motor: 5/5 strength in all extremities Sensory:equal and intact in all extremities DTR:2+ Cerebellar: No ataxia on exam. FNF intact   Pertinent Labs: Troponin- 123 -> peak 132 B12 - 532 Sed 13 T3 1.8 TSH 1.131 CEA 1.4 CRP 1.6 [place] spotted fever- pending B1- pending Hgb 6.6   03/13/2022  IgG P93 Ab. Present !   IgG P66 Ab. Absent   IgG P58 Ab. Present !   IgG P45 Ab. Absent   IgG P41 Ab. Present !   IgG P39 Ab. Absent   IgG P30 Ab. Absent   IgG P28 Ab. Absent   IgG P23 Ab. Absent   IgG P18 Ab.  Absent   Lyme IgG Wb Negative   IgM P41 Ab. Absent   IgM P39 Ab. Absent   IgM P23 Ab. Absent   Lyme IgM Wb Negative     MRI Brain 1. No acute intracranial abnormality or significant interval change. 2. 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 3. Stable diffuse white matter disease. This likely reflects the sequela of chronic microvascular ischemia.  4. Postcontrast images demonstrate no pathologic enhancement. MRI C spine 1. No significant change in appearance of the cervical spine with and without contrast. 2. Multilevel foraminal narrowing as described. 3. Mild foraminal narrowing bilaterally at C2-3. 4. Severe left and moderate right foraminal stenosis at C3-4. 5. Moderate foraminal narrowing at C4-5 is worse left than right. 6. Moderate right and mild left foraminal narrowing at C5-6. 7. Mild left foraminal narrowing at C6-7.   Impression: symptomatic bradycardia vs seizure- discussed with PCCM, they will consider cardiology/EP consult as well. They have also sent out additional laboratory testing.   Recommendations: - continue keppra '500mg'$  BID - wean per PCCM- lasix trial today  - close neuro monitoring -Neurology will be available as needed, please call with any questions/concerns  Patient seen and examined by NP/APP with MD. MD to update note as needed.   Janine Ores, DNP, FNP-BC [facility] Pager: [phone]

## 2022-03-16 NOTE — Progress Notes (Signed)
[facility] ADULT ICU REPLACEMENT PROTOCOL   The patient does apply for the [facility] Adult ICU Electrolyte Replacment Protocol based on the criteria listed below:   1.Exclusion criteria: TCTS patients, ECMO patients, and Dialysis patients 2. Is GFR >/= 30 ml/min? Yes.    Patient's GFR today is > 60 3. Is SCr </= 2? Yes.   Patient's SCr is 0.85 mg/dL 4. Did SCr increase >/= 0.5 in 24 hours? No. 5.Pt's weight >40kg  Yes.   6. Abnormal electrolyte(s): K+ 3.4  7. Electrolytes replaced per protocol 8.  Call MD STAT for K+ </= 2.5, Phos </= 1, or Mag </= 1 Physician:  Dr Gabriel Rainwater, Elfredia Nevins 03/16/2022 5:36 AM

## 2022-03-16 NOTE — Progress Notes (Signed)
eLink Physician-Brief Progress Note Patient Name: SARRINAH GARDIN DOB: 12/03/1950 MRN: [id number]   Date of Service  03/16/2022  HPI/Events of Note  Notified of fever with Tmax 101.10F. Pt started on Unasyn 03/15/22.  CXR done today showed mild irregular bibasilar opacities which may represent atelectasis and/or infiltrate. MRSA negative.   eICU Interventions  Send blood cultures.  Send sputum culture if able.  Change Unasyn to Zosyn.      Intervention Category Intermediate Interventions: Other:  Elsie Lincoln 03/16/2022, 9:13 PM

## 2022-03-17 ENCOUNTER — Inpatient Hospital Stay (HOSPITAL_COMMUNITY): Payer: Medicare Other

## 2022-03-17 DIAGNOSIS — R0689 Other abnormalities of breathing: Secondary | ICD-10-CM | POA: Diagnosis not present

## 2022-03-17 DIAGNOSIS — J9602 Acute respiratory failure with hypercapnia: Secondary | ICD-10-CM | POA: Diagnosis not present

## 2022-03-17 DIAGNOSIS — G9340 Encephalopathy, unspecified: Secondary | ICD-10-CM | POA: Diagnosis not present

## 2022-03-17 DIAGNOSIS — R001 Bradycardia, unspecified: Secondary | ICD-10-CM | POA: Diagnosis not present

## 2022-03-17 DIAGNOSIS — J9601 Acute respiratory failure with hypoxia: Secondary | ICD-10-CM | POA: Diagnosis not present

## 2022-03-17 DIAGNOSIS — E43 Unspecified severe protein-calorie malnutrition: Secondary | ICD-10-CM | POA: Diagnosis not present

## 2022-03-17 LAB — CBC
HCT: 21.7 % — ABNORMAL LOW (ref 36.0–46.0)
Hemoglobin: 7.2 g/dL — ABNORMAL LOW (ref 12.0–15.0)
MCH: 29.1 pg (ref 26.0–34.0)
MCHC: 33.2 g/dL (ref 30.0–36.0)
MCV: 87.9 fL (ref 80.0–100.0)
Platelets: 118 10*3/uL — ABNORMAL LOW (ref 150–400)
RBC: 2.47 MIL/uL — ABNORMAL LOW (ref 3.87–5.11)
RDW: 14.5 % (ref 11.5–15.5)
WBC: 8.4 10*3/uL (ref 4.0–10.5)
nRBC: 0 % (ref 0.0–0.2)

## 2022-03-17 LAB — POCT I-STAT 7, (LYTES, BLD GAS, ICA,H+H)
Acid-Base Excess: 8 mmol/L — ABNORMAL HIGH (ref 0.0–2.0)
Acid-Base Excess: 8 mmol/L — ABNORMAL HIGH (ref 0.0–2.0)
Bicarbonate: 30.5 mmol/L — ABNORMAL HIGH (ref 20.0–28.0)
Bicarbonate: 30.4 mmol/L — ABNORMAL HIGH (ref 20.0–28.0)
Calcium, Ion: 1.22 mmol/L (ref 1.15–1.40)
Calcium, Ion: 1.26 mmol/L (ref 1.15–1.40)
HCT: 24 % — ABNORMAL LOW (ref 36.0–46.0)
HCT: 23 % — ABNORMAL LOW (ref 36.0–46.0)
Hemoglobin: 8.2 g/dL — ABNORMAL LOW (ref 12.0–15.0)
Hemoglobin: 7.8 g/dL — ABNORMAL LOW (ref 12.0–15.0)
O2 Saturation: 100 %
O2 Saturation: 99 %
Patient temperature: 96.6
Patient temperature: 96.1
Potassium: 4.3 mmol/L (ref 3.5–5.1)
Potassium: 4 mmol/L (ref 3.5–5.1)
Sodium: 146 mmol/L — ABNORMAL HIGH (ref 135–145)
Sodium: 146 mmol/L — ABNORMAL HIGH (ref 135–145)
TCO2: 31 mmol/L (ref 22–32)
TCO2: 31 mmol/L (ref 22–32)
pCO2 arterial: 30 mmHg — ABNORMAL LOW (ref 32–48)
pCO2 arterial: 31.7 mmHg — ABNORMAL LOW (ref 32–48)
pH, Arterial: 7.611 (ref 7.35–7.45)
pH, Arterial: 7.586 — ABNORMAL HIGH (ref 7.35–7.45)
pO2, Arterial: 157 mmHg — ABNORMAL HIGH (ref 83–108)
pO2, Arterial: 111 mmHg — ABNORMAL HIGH (ref 83–108)

## 2022-03-17 LAB — COMPREHENSIVE METABOLIC PANEL
ALT: 13 U/L (ref 0–44)
AST: 11 U/L — ABNORMAL LOW (ref 15–41)
Albumin: 2 g/dL — ABNORMAL LOW (ref 3.5–5.0)
Alkaline Phosphatase: 42 U/L (ref 38–126)
Anion gap: 7 (ref 5–15)
BUN: 21 mg/dL (ref 8–23)
CO2: 27 mmol/L (ref 22–32)
Calcium: 8.1 mg/dL — ABNORMAL LOW (ref 8.9–10.3)
Chloride: 110 mmol/L (ref 98–111)
Creatinine, Ser: 0.82 mg/dL (ref 0.44–1.00)
GFR, Estimated: >60 mL/min (ref >60)
Glucose, Bld: 126 mg/dL — ABNORMAL HIGH (ref 70–99)
Potassium: 3.4 mmol/L — ABNORMAL LOW (ref 3.5–5.1)
Sodium: 144 mmol/L (ref 135–145)
Total Bilirubin: 1.3 mg/dL — ABNORMAL HIGH (ref 0.3–1.2)
Total Protein: 4.8 g/dL — ABNORMAL LOW (ref 6.5–8.1)

## 2022-03-17 LAB — BASIC METABOLIC PANEL
Anion gap: 9 (ref 5–15)
BUN: 22 mg/dL (ref 8–23)
CO2: 28 mmol/L (ref 22–32)
Calcium: 8.9 mg/dL (ref 8.9–10.3)
Chloride: 112 mmol/L — ABNORMAL HIGH (ref 98–111)
Creatinine, Ser: 0.85 mg/dL (ref 0.44–1.00)
GFR, Estimated: >60 mL/min (ref >60)
Glucose, Bld: 130 mg/dL — ABNORMAL HIGH (ref 70–99)
Potassium: 4.2 mmol/L (ref 3.5–5.1)
Sodium: 149 mmol/L — ABNORMAL HIGH (ref 135–145)

## 2022-03-17 LAB — PHOSPHORUS: Phosphorus: 3.1 mg/dL (ref 2.5–4.6)

## 2022-03-17 LAB — BLOOD GAS, ARTERIAL
Acid-base deficit: 2.4 mmol/L — ABNORMAL HIGH (ref 0.0–2.0)
Bicarbonate: 33 mmol/L — ABNORMAL HIGH (ref 20.0–28.0)
Drawn by: 337301
O2 Saturation: 100 %
Patient temperature: 37.8
pCO2 arterial: >123 mmHg (ref 32–48)
pH, Arterial: 6.98 — CL (ref 7.35–7.45)
pO2, Arterial: 261 mmHg — ABNORMAL HIGH (ref 83–108)

## 2022-03-17 LAB — GLUCOSE, CAPILLARY
Glucose-Capillary: 106 mg/dL — ABNORMAL HIGH (ref 70–99)
Glucose-Capillary: 109 mg/dL — ABNORMAL HIGH (ref 70–99)
Glucose-Capillary: 118 mg/dL — ABNORMAL HIGH (ref 70–99)
Glucose-Capillary: 124 mg/dL — ABNORMAL HIGH (ref 70–99)
Glucose-Capillary: 136 mg/dL — ABNORMAL HIGH (ref 70–99)
Glucose-Capillary: 148 mg/dL — ABNORMAL HIGH (ref 70–99)

## 2022-03-17 LAB — PROCALCITONIN: Procalcitonin: <0.1 ng/mL

## 2022-03-17 LAB — LYME DISEASE SEROLOGY W/REFLEX: Lyme Total Antibody EIA: NEGATIVE

## 2022-03-17 LAB — CULTURE, RESPIRATORY W GRAM STAIN: Culture: NORMAL

## 2022-03-17 LAB — BPAM RBC
Blood Product Expiration Date: 202306302359
ISSUE DATE / TIME: 202306180551
Unit Type and Rh: 9500

## 2022-03-17 LAB — TYPE AND SCREEN
ABO/RH(D): O NEG
Antibody Screen: NEGATIVE
Unit division: 0

## 2022-03-17 LAB — LACTIC ACID, PLASMA: Lactic Acid, Venous: 0.5 mmol/L (ref 0.5–1.9)

## 2022-03-17 LAB — TRIGLYCERIDES: Triglycerides: 51 mg/dL (ref <150)

## 2022-03-17 MED ORDER — FENTANYL CITRATE PF 50 MCG/ML IJ SOSY: 25.0000 ug | PREFILLED_SYRINGE | INTRAMUSCULAR | Status: DC | PRN

## 2022-03-17 MED ORDER — FUROSEMIDE 10 MG/ML IJ SOLN
40.0000 mg | Freq: Once | INTRAMUSCULAR | Status: AC
Start: 2022-03-17 — End: 2022-03-17
  Administered 2022-03-17: 40 mg via INTRAVENOUS
  Filled 2022-03-17: qty 4

## 2022-03-17 MED ORDER — ETOMIDATE 2 MG/ML IV SOLN
INTRAVENOUS | Status: AC
  Administered 2022-03-17: 20 mg via INTRAVENOUS
  Filled 2022-03-17: qty 20

## 2022-03-17 MED ORDER — DEXMEDETOMIDINE HCL IN NACL 400 MCG/100ML IV SOLN
INTRAVENOUS | Status: AC
  Administered 2022-03-17: 0.5 ug/kg/h via INTRAVENOUS
  Filled 2022-03-17: qty 100

## 2022-03-17 MED ORDER — ROCURONIUM BROMIDE 50 MG/5ML IV SOLN
100.0000 mg | Freq: Once | INTRAVENOUS | Status: AC
Start: 2022-03-17 — End: 2022-03-17
  Filled 2022-03-17: qty 10

## 2022-03-17 MED ORDER — FENTANYL CITRATE PF 50 MCG/ML IJ SOSY
PREFILLED_SYRINGE | INTRAMUSCULAR | Status: AC
  Filled 2022-03-17: qty 2

## 2022-03-17 MED ORDER — SUCCINYLCHOLINE CHLORIDE 200 MG/10ML IV SOSY
PREFILLED_SYRINGE | INTRAVENOUS | Status: AC
  Filled 2022-03-17: qty 10

## 2022-03-17 MED ORDER — POTASSIUM CHLORIDE 20 MEQ PO PACK
40.0000 meq | PACK | Freq: Once | ORAL | Status: AC
  Administered 2022-03-17: 40 meq

## 2022-03-17 MED ORDER — DEXMEDETOMIDINE HCL IN NACL 400 MCG/100ML IV SOLN
0.0000 ug/kg/h | INTRAVENOUS | Status: DC
  Administered 2022-03-19: 0.3 ug/kg/h via INTRAVENOUS
  Filled 2022-03-17: qty 100

## 2022-03-17 MED ORDER — ETOMIDATE 2 MG/ML IV SOLN
20.0000 mg | Freq: Once | INTRAVENOUS | Status: AC
Start: 2022-03-17 — End: 2022-03-17

## 2022-03-17 MED ORDER — ROCURONIUM BROMIDE 10 MG/ML (PF) SYRINGE
PREFILLED_SYRINGE | INTRAVENOUS | Status: AC
  Administered 2022-03-17: 100 mg via INTRAVENOUS
  Filled 2022-03-17: qty 10

## 2022-03-17 MED ORDER — POTASSIUM CHLORIDE 20 MEQ PO PACK
40.0000 meq | PACK | Freq: Once | ORAL | Status: AC
  Administered 2022-03-17: 40 meq
  Filled 2022-03-17: qty 2

## 2022-03-17 MED ORDER — DOCUSATE SODIUM 50 MG/5ML PO LIQD: 100.0000 mg | Freq: Two times a day (BID) | ORAL | Status: DC

## 2022-03-17 MED ORDER — POLYETHYLENE GLYCOL 3350 17 G PO PACK
17.0000 g | PACK | Freq: Every day | ORAL | Status: DC
Start: 2022-03-17 — End: 2022-03-21
  Administered 2022-03-19 – 2022-03-21 (×3): 17 g
  Filled 2022-03-17 (×3): qty 1

## 2022-03-17 MED ORDER — MIDAZOLAM HCL 2 MG/2ML IJ SOLN
INTRAMUSCULAR | Status: AC
  Filled 2022-03-17: qty 2

## 2022-03-17 MED ORDER — POTASSIUM CHLORIDE 20 MEQ PO PACK
40.0000 meq | PACK | Freq: Once | ORAL | Status: DC
  Filled 2022-03-17: qty 2

## 2022-03-17 MED ORDER — FENTANYL CITRATE PF 50 MCG/ML IJ SOSY
25.0000 ug | PREFILLED_SYRINGE | INTRAMUSCULAR | Status: DC | PRN
  Administered 2022-03-18: 100 ug via INTRAVENOUS
  Filled 2022-03-17: qty 2

## 2022-03-17 NOTE — Procedures (Signed)
Cortrak  Person Inserting Tube:  Jaydn Moscato D, RD Tube Type:  Cortrak - 43 inches Tube Size:  10 Tube Location:  Left nare Secured by: Bridle Technique Used to Measure Tube Placement:  Marking at nare/corner of mouth Cortrak Secured At:  65 cm  Cortrak Tube Team Note:  Consult received to place a Cortrak feeding tube.   X-ray is required, abdominal x-ray has been ordered by the Cortrak team. Please confirm tube placement before using the Cortrak tube.   If the tube becomes dislodged please keep the tube and contact the Cortrak team at [email or web address] ([email or web address]) for replacement.  If after hours and replacement cannot be delayed, place a NG tube and confirm placement with an abdominal x-ray.    Veria Stradley, RD, LDN Clinical Dietitian RD pager # available in AMION  After hours/weekend pager # available in AMION   

## 2022-03-17 NOTE — Progress Notes (Signed)
PT Cancellation Note  Patient Details Name: Sheila Fernandez MRN: [id number] DOB: Feb 03, 1951   Cancelled Treatment:    Reason Eval/Treat Not Completed: Patient not medically ready (pt extubated but now currently unresponsive with elevated BP, medical team assessing. Will hold this date)   Lamarr Lulas 03/17/2022, 11:45 AM [place] Office: [phone]

## 2022-03-17 NOTE — Progress Notes (Addendum)
NAME:  Sheila Fernandez, MRN:  [id number], DOB:  1950/11/25, LOS: 6 ADMISSION DATE:  03/11/2022, CONSULTATION DATE:  03/11/22 REFERRING MD:  Darl Householder, CHIEF COMPLAINT:  Chest pain, abdominal pain  History of Present Illness:  71 yo woman with chest pain, abdominal pain, SOB. Sudden onset epigastric pain radiating to back since yesterday.  BP elevated always, missed her bp meds this am. CT chest several months ago, mass vs atelectasis.  Here with mid abdominal pain since yesterday.  Low back pain.  SOB, fatigue x 3 months Fatigue and weight loss x 6 months.  Recent abdominal pain, started on protonix    Admitted to ICU, CT abdomen/pelvis negative for AAS, initially on esmolol infusion then required Levophed for hypotension and intubation for encephalopathy. Spot EEG and head CT were negative.  She was extubated 6/14 and transferred out of the ICU.    Overnight on 6/15, pt was ambulating and conversing with family when she suddenly became restless and then altered and had an episode of bradycardia to the 40's.  A code blue was called, but pt never lost pulses, she did require bagging for a short time.  Her HR improved without medications and she was responding to some commands, but had upwardly deviated gaze and flexion of the R arm.  She was transferred to the ICU and re-intubated and loaded with Keppra   Pertinent  Medical History  HTN, Orthostatic hypotension, Anemia, Arthritis, Iritis, Recurrent Sickle cell trait  Echocardiogram 12/10/2021: Left ventricle cavity is normal in size and wall thickness. Normal global wall motion. Normal LV systolic function with EF 61%. Doppler evidence of grade I (impaired) diastolic dysfunction, normal LAP. Mild mitral valve stenosis. Mild tricuspid regurgitation. IVC not seen. No significant change compared to previous study in 2019.  Lexiscan Tetrofosmin stress test 02/01/2022:Normal myocardial perfusion. Stress LVEF 57%.Low risk study.  Significant  Hospital Events: Including procedures, antibiotic start and stop dates in addition to other pertinent events   6/13 admitted to ICU for esmolol gtt with question of possible dissection 6/14 hypotensive, AMS on esmolol 300. Intubated. Starting NE. CT H neg. CTA no AAS. BAL: cytology neg.  Extubate 6/15 6/15-16 night Episode of AMS and bradycardia, concern for sz, re-intubated and loaded with Keppra 6/16 still no sz on EEG. MRI c spine and brain: and without contrast. Mild foraminal narrowing bilaterally at C2-3.Severe left and moderate right foraminal stenosis at C3-4.Moderate foraminal narrowing at C4-5 is worse left than right.  Moderate right and mild left foraminal narrowing at C5-6.Mild left foraminal narrowing at C6-7. No sig change compared w/ MRI 4/11; No acute intracranial abnormality or significant interval change. 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 This likely reflects the sequela of chronic microvascular ischemia. 4/17 failed SBT attempt. PCXR w/ right sided PNA. Cultures sent. Unasyn started.    Interim History / Subjective:  Remains weak, tolerating spontaneous breathing trial Much more awake and alert  Objective   Blood pressure 102/70, pulse 81, temperature 99.4 F (37.4 C), temperature source Oral, resp. rate (!) 22, height _0  (1.549 m), weight 55 kg, SpO2 100 %.    Vent Mode: PSV;CPAP FiO2 (%):  [40 %] 40 % Set Rate:  [20 bmp] 20 bmp Vt Set:  [380 mL] 380 mL PEEP:  [5 cmH20] 5 cmH20 Pressure Support:  [8 KLK91-79 cmH20] 8 cmH20 Plateau Pressure:  [16 cmH20-20 cmH20] 17 cmH20   Intake/Output Summary (Last 24 hours) at 03/17/2022 0947 Last data  filed at 03/17/2022 0800 Gross per 24 hour  Intake 4504.56 ml  Output 2165 ml  Net 2339.56 ml   Filed Weights   03/15/22 0600 03/16/22 0500 03/17/22 0500  Weight: 59.7 kg 57.6 kg 55 kg     Physical exam: General: Crtitically ill-appearing female, orally  intubated HEENT: [place]/AT, eyes anicteric.  ETT and OGT in place Neuro: Awake, following commands, moving all 4 EXTR Chest: Coarse breath sounds, no wheezes or rhonchi Heart: Regular rate and rhythm, no murmurs or gallops Abdomen: Soft, nontender, nondistended, bowel sounds present Skin: No rash  Resolved Hospital Problem list   AKI   Assessment & Plan:  Probable autonomic dysfunction manifested with severe symptomatic bradycardia and hypotension  Patient with history of orthostatic hypotension, being worked up as an outpatient  She became bradycardic while ambulating this time  No more episodes of bradycardia Continue telemetry monitoring Hold AV nodal blocking agents including beta-blocker and calcium channel blocker Remain on very low-dose vasopressor, currently at 1 mic of Levophed We will ask EP/cardiology consult postextubation Patient follows Dr. Einar Gip as an outpatient  Acute encephalopathy could be related to seizure versus low flow state during severe symptomatic bradycardia Mental status has improved significantly EEG was negative for seizures Appreciate neurology recommendations, recommend continuing Keppra for now Continue neuro monitoring  Acute respiratory failure requiring reintubation c/b probable aspiration PNA Patient continued to spike fever She has thick yellowish secretions via ET tube Respiratory culture is growing gram-positive cocci in pairs Antibiotics were switched to Zosyn from Unasyn last night Follow-up sensitivity result and adjust antibiotic accordingly VAP bundle   Hypernatremia, hypokalemia  Serum sodium has trended down to 144 Continue aggressive potassium replacement and monitor  Cervical radiculopathy  On recent EMG -- Acute on chronic C8 radiculopathy (severe), chornic C5 radiculopathy (mild-mod) -- affecting BUE  Carpal tunnel.   F/u outpt  Additional recs per neuro   Acute on anemia of chronic disease Hx sickle cell trait; hgb went  down to 6.6  Received 1 unit PRBC now hemoglobin 7.2 Monitor H&H and transfuse if less than 7  Constipation Continue aggressive bowel regimen  Severe protein calorie malnutrition Continue dietary supplements  Best Practice (right click and "Reselect all SmartList Selections" daily)   Diet/type: tubefeeds DVT prophylaxis: SCD GI prophylaxis: PPI Lines: Central line Foley:  N/A Code Status:  full code Last date of multidisciplinary goals of care discussion [ 6/16 son, 2 daughters updated at bedside 6/19) ]    Total critical care time: 35 minutes  Performed by: [place] care time was exclusive of separately billable procedures and treating other patients.   Critical care was necessary to treat or prevent imminent or life-threatening deterioration.   Critical care was time spent personally by me on the following activities: development of treatment plan with patient and/or surrogate as well as nursing, discussions with consultants, evaluation of patient's response to treatment, examination of patient, obtaining history from patient or surrogate, ordering and performing treatments and interventions, ordering and review of laboratory studies, ordering and review of radiographic studies, pulse oximetry and re-evaluation of patient's condition.   Jacky Kindle, MD [place] [facility] See Amion for pager If no response to pager, please call [phone] until 7pm After 7pm, Please call E-link [phone]

## 2022-03-17 NOTE — Progress Notes (Signed)
RN entered patient's room at 1130 and found patient unresponsive, stiff, restless and hypertensive. Notified Dr. Jacky Kindle immediately and Dr. Tacy Learn arrived in room at 1132. Orders to draw an ABG and lactic acid stat as well as give 2 mg Ativan. After ativan given, patient calms down but is still unresponsive and agonal breathing. ABG resulted with pH 6.98 and CO2 > 110 on iStat. Dr. Tacy Learn ordered to intubate. 20 mg Etomidate and 100 mg rocuronium given per Dr. Tacy Learn. Patient tolerated procedure well and two daughters notified.

## 2022-03-17 NOTE — Progress Notes (Signed)
[facility] ADULT ICU REPLACEMENT PROTOCOL   The patient does apply for the [facility] Adult ICU Electrolyte Replacment Protocol based on the criteria listed below:   1.Exclusion criteria: TCTS patients, ECMO patients, and Dialysis patients 2. Is GFR >/= 30 ml/min? Yes.    Patient's GFR today is > 60 3. Is SCr </= 2? Yes.   Patient's SCr is 0.82 mg/dL 4. Did SCr increase >/= 0.5 in 24 hours? No. 5.Pt's weight >40kg  Yes.   6. Abnormal electrolyte(s): K+ 3.4  7. Electrolytes replaced per protocol 8.  Call MD STAT for K+ </= 2.5, Phos </= 1, or Mag </= 1 Physician:  Dr Kandis Nab, Elfredia Nevins 03/17/2022 4:48 AM

## 2022-03-17 NOTE — Progress Notes (Signed)
Patient was extubated, she tolerated the procedure well, her vital signs remained stable she was able to talk with her daughters at bedside.  After 45 minutes of being extubated patient was noted to be unresponsive, not following commands, having mouthing movement, she was turning her neck side to side.  Initial thought was patient may be having seizure, she was given 2 mg of IV Ativan without much improvement.  ABG was drawn showing pH 6.98, PCO2 >123, PO2 261  Telemetry monitoring showed peaked T waves, patient family was informed about new development of her condition, patient was intubated and placed on mechanical ventilator in the setting of acute hypercapnic respiratory failure and acute metabolic encephalopathy due to CO2 narcosis.  Discussed with patient's daughter at bedside, we have plan to meet with all family members tomorrow to discuss next course of action from here.  Ventilator was stopped with respirate of 30, will repeat ABG in half an hour  Post intubation patient was noted to be hypotensive, was started on IV Levophed to maintain map goal 65  An arterial line was placed.  Please see separate procedure notes   Additional critical care time: 42 minutes  Performed by: [place] care time was exclusive of separately billable procedures and treating other patients.   Critical care was necessary to treat or prevent imminent or life-threatening deterioration.   Critical care was time spent personally by me on the following activities: development of treatment plan with patient and/or surrogate as well as nursing, discussions with consultants, evaluation of patient's response to treatment, examination of patient, obtaining history from patient or surrogate, ordering and performing treatments and interventions, ordering and review of laboratory studies, ordering and review of radiographic studies, pulse oximetry and re-evaluation of patient's condition.   Jacky Kindle, MD [place] [facility] See Amion for pager If no response to pager, please call [phone] until 7pm After 7pm, Please call E-link [phone]

## 2022-03-17 NOTE — Procedures (Signed)
Intubation Procedure Note  Sheila Fernandez  [id number]  [birth year]  Date:03/17/22  Time:1:06 PM   Provider Performing:Shatera Rennert    Procedure: Intubation (31500)  Indication(s) Respiratory Failure  Consent Risks of the procedure as well as the alternatives and risks of each were explained to the patient and/or caregiver.  Consent for the procedure was obtained and is signed in the bedside chart   Anesthesia Etomidate and Rocuronium   Time Out Verified patient identification, verified procedure, site/side was marked, verified correct patient position, special equipment/implants available, medications/allergies/relevant history reviewed, required imaging and test results available.   Sterile Technique Usual hand hygeine, masks, and gloves were used   Procedure Description Patient positioned in bed supine.  Sedation given as noted above.  Patient was intubated with endotracheal tube using  MAC 4 .  View was Grade 1 full glottis .  Number of attempts was 1.  Colorimetric CO2 detector was consistent with tracheal placement.   Complications/Tolerance None; patient tolerated the procedure well. Chest X-ray is ordered to verify placement.   EBL Minimal   Specimen(s) None

## 2022-03-17 NOTE — Procedures (Signed)
Extubation Procedure Note  Patient Details:   Name: Sheila Fernandez DOB: 01/10/1951 MRN: [id number]   Airway Documentation:    Vent end date: 03/17/22 Vent end time: 1025   Evaluation  O2 sats: stable throughout Complications: No apparent complications Patient did tolerate procedure well. Bilateral Breath Sounds: Diminished, Clear   Yes  Pt. Positive for cuff leak, Pt. Placed on [place] 4L with humidity. No stridor noted. Pt. Able to reach 272m IS.  Harrell Niehoff J BSarajane MarekGuevara 03/17/2022, 10:32 AM

## 2022-03-17 NOTE — Progress Notes (Signed)
PT Cancellation Note  Patient Details Name: Sheila Fernandez MRN: [id number] DOB: 02-10-1951   Cancelled Treatment:    Reason Eval/Treat Not Completed: Patient not medically ready (pt remains intubated with hopeful extubation today, will plan to hold and await possible extubation)   Catha Ontko B Micheline Markes 03/17/2022, 8:48 AM [place] Office: [phone]

## 2022-03-17 NOTE — Progress Notes (Signed)
Pt. Placed on wean by Dr. Tacy Learn

## 2022-03-17 NOTE — Progress Notes (Signed)
Critical ABG values give to Dr. Tacy Learn, CCM.

## 2022-03-17 NOTE — Progress Notes (Signed)
OT Cancellation Note  Patient Details Name: Sheila Fernandez MRN: [id number] DOB: 22-May-1951   Cancelled Treatment:    Reason Eval/Treat Not Completed: Patient not medically ready (Pt intubated with possible extubation today. Will attempt later time.)  Sheila Fernandez,Sheila Fernandez 03/17/2022, 9:30 AM Sheila Fernandez, OT 03/17/2022

## 2022-03-17 NOTE — Procedures (Signed)
Arterial Catheter Insertion Procedure Note  Annick LILLION ELBERT  [id number]  02-04-1951  Date:03/17/22  Time:1:06 PM    Provider Performing: Jacky Kindle    Procedure: Insertion of Arterial Line [phone]) with US guidance (81017)   Indication(s) Blood pressure monitoring and/or need for frequent ABGs  Consent Risks of the procedure as well as the alternatives and risks of each were explained to the patient and/or caregiver.  Consent for the procedure was obtained and is signed in the bedside chart  Anesthesia None   Time Out Verified patient identification, verified procedure, site/side was marked, verified correct patient position, special equipment/implants available, medications/allergies/relevant history reviewed, required imaging and test results available.   Sterile Technique Maximal sterile technique including full sterile barrier drape, hand hygiene, sterile gown, sterile gloves, mask, hair covering, sterile ultrasound probe cover (if used).   Procedure Description Area of catheter insertion was cleaned with chlorhexidine and draped in sterile fashion. With real-time ultrasound guidance an arterial catheter was placed into the left  Axillary  artery.  Appropriate arterial tracings confirmed on monitor.     Complications/Tolerance None; patient tolerated the procedure well.   EBL Minimal   Specimen(s) None

## 2022-03-17 NOTE — Progress Notes (Signed)
Nutrition Follow-up  DOCUMENTATION CODES:   Severe malnutrition in context of acute illness/injury  INTERVENTION:   Tube Feeds via Cortrak:  Vital AF @ 60 mL/hr (1440 mL/day)  Provides 1728 kcal, 108 gm of protein, and 1166 mL free water daily. Continue Multivitamin w/ minerals daily via tube  NUTRITION DIAGNOSIS:   Severe Malnutrition related to acute illness as evidenced by severe muscle depletion, severe fat depletion. - Ongoing  GOAL:   Patient will meet greater than or equal to 90% of their needs - Ongoing  MONITOR:   Vent status, Labs, Weight trends, TF tolerance  REASON FOR ASSESSMENT:   Ventilator, Malnutrition Screening Tool    ASSESSMENT:   Pt with PMH of HTN, orthostatic hypotension, anemia, arthritis, sickle cell trait, and recent weight loss and fatigue x 3 months, abd pain started on protonix now admitted with SOB and possible ascending aortic dissection.  6/16 - extubated in AM; reintubated that evening 6/19 - failed extubated, required reintubation; Cortrak placed (  Pt just extubated at time of RD visit. Family at bedside. Discussed that if pt did not pass swallow evaluation may need Cortrak; family and pt expressed good understanding.  RN sent a secure chat informing that pt required reintubation and that plan is to place a Cortrak.   Patient is currently intubated on ventilator support. MV: 10.8 L/min Temp (24hrs), Avg:99.8 F (37.7 C), Min:98.4 F (36.9 C), Max:101.3 F (38.5 C) Continuous Medications: Levophed  Medications reviewed and include: Colace, MVI, Protonix, Miralax, Thiamine, IV antibiotics  Labs reviewed: Potassium 3.4   Diet Order:   Diet Order             Diet NPO time specified  Diet effective now                   EDUCATION NEEDS:   Not appropriate for education at this time  Skin:  Skin Assessment: Reviewed RN Assessment  Last BM:  6/18  Height:   Ht Readings from Last 1 Encounters:  03/15/22 '5\' 1"'$  (1.549  m)    Weight:   Wt Readings from Last 1 Encounters:  03/17/22 55 kg   BMI:  Body mass index is 22.91 kg/m.  Estimated Nutritional Needs:   Kcal:  1600-1800  Protein:  80-100 grams  Fluid:  >1.6 L/day    Hermina Barters RD, LDN Clinical Dietitian See [facility] for contact information.

## 2022-03-18 ENCOUNTER — Inpatient Hospital Stay (HOSPITAL_COMMUNITY): Payer: Medicare Other

## 2022-03-18 DIAGNOSIS — J9601 Acute respiratory failure with hypoxia: Secondary | ICD-10-CM | POA: Diagnosis not present

## 2022-03-18 DIAGNOSIS — R001 Bradycardia, unspecified: Secondary | ICD-10-CM | POA: Diagnosis not present

## 2022-03-18 DIAGNOSIS — E43 Unspecified severe protein-calorie malnutrition: Secondary | ICD-10-CM | POA: Diagnosis not present

## 2022-03-18 DIAGNOSIS — G9340 Encephalopathy, unspecified: Secondary | ICD-10-CM | POA: Diagnosis not present

## 2022-03-18 LAB — BASIC METABOLIC PANEL
Anion gap: 6 (ref 5–15)
BUN: 28 mg/dL — ABNORMAL HIGH (ref 8–23)
CO2: 29 mmol/L (ref 22–32)
Calcium: 8.6 mg/dL — ABNORMAL LOW (ref 8.9–10.3)
Chloride: 112 mmol/L — ABNORMAL HIGH (ref 98–111)
Creatinine, Ser: 0.81 mg/dL (ref 0.44–1.00)
GFR, Estimated: >60 mL/min (ref >60)
Glucose, Bld: 132 mg/dL — ABNORMAL HIGH (ref 70–99)
Potassium: 3.8 mmol/L (ref 3.5–5.1)
Sodium: 147 mmol/L — ABNORMAL HIGH (ref 135–145)

## 2022-03-18 LAB — CBC
HCT: 24.4 % — ABNORMAL LOW (ref 36.0–46.0)
Hemoglobin: 7.5 g/dL — ABNORMAL LOW (ref 12.0–15.0)
MCH: 27.9 pg (ref 26.0–34.0)
MCHC: 30.7 g/dL (ref 30.0–36.0)
MCV: 90.7 fL (ref 80.0–100.0)
Platelets: 166 10*3/uL (ref 150–400)
RBC: 2.69 MIL/uL — ABNORMAL LOW (ref 3.87–5.11)
RDW: 14.6 % (ref 11.5–15.5)
WBC: 8.3 10*3/uL (ref 4.0–10.5)
nRBC: 0 % (ref 0.0–0.2)

## 2022-03-18 LAB — GLUCOSE, CAPILLARY
Glucose-Capillary: 125 mg/dL — ABNORMAL HIGH (ref 70–99)
Glucose-Capillary: 126 mg/dL — ABNORMAL HIGH (ref 70–99)
Glucose-Capillary: 133 mg/dL — ABNORMAL HIGH (ref 70–99)
Glucose-Capillary: 158 mg/dL — ABNORMAL HIGH (ref 70–99)
Glucose-Capillary: 74 mg/dL (ref 70–99)
Glucose-Capillary: 98 mg/dL (ref 70–99)

## 2022-03-18 LAB — POCT I-STAT 7, (LYTES, BLD GAS, ICA,H+H)
Acid-Base Excess: 6 mmol/L — ABNORMAL HIGH (ref 0.0–2.0)
Acid-Base Excess: 10 mmol/L — ABNORMAL HIGH (ref 0.0–2.0)
Bicarbonate: 31.5 mmol/L — ABNORMAL HIGH (ref 20.0–28.0)
Bicarbonate: 35.5 mmol/L — ABNORMAL HIGH (ref 20.0–28.0)
Calcium, Ion: 1.29 mmol/L (ref 1.15–1.40)
Calcium, Ion: 1.29 mmol/L (ref 1.15–1.40)
HCT: 22 % — ABNORMAL LOW (ref 36.0–46.0)
HCT: 22 % — ABNORMAL LOW (ref 36.0–46.0)
Hemoglobin: 7.5 g/dL — ABNORMAL LOW (ref 12.0–15.0)
Hemoglobin: 7.5 g/dL — ABNORMAL LOW (ref 12.0–15.0)
O2 Saturation: 99 %
O2 Saturation: 99 %
Patient temperature: 98.7
Patient temperature: 99.2
Potassium: 3.8 mmol/L (ref 3.5–5.1)
Potassium: 3.8 mmol/L (ref 3.5–5.1)
Sodium: 148 mmol/L — ABNORMAL HIGH (ref 135–145)
Sodium: 147 mmol/L — ABNORMAL HIGH (ref 135–145)
TCO2: 33 mmol/L — ABNORMAL HIGH (ref 22–32)
TCO2: 37 mmol/L — ABNORMAL HIGH (ref 22–32)
pCO2 arterial: 52.6 mmHg — ABNORMAL HIGH (ref 32–48)
pCO2 arterial: 53.1 mmHg — ABNORMAL HIGH (ref 32–48)
pH, Arterial: 7.386 (ref 7.35–7.45)
pH, Arterial: 7.435 (ref 7.35–7.45)
pO2, Arterial: 127 mmHg — ABNORMAL HIGH (ref 83–108)
pO2, Arterial: 131 mmHg — ABNORMAL HIGH (ref 83–108)

## 2022-03-18 LAB — MAGNESIUM: Magnesium: 1.8 mg/dL (ref 1.7–2.4)

## 2022-03-18 MED ORDER — ROCURONIUM BROMIDE 50 MG/5ML IV SOLN
100.0000 mg | Freq: Once | INTRAVENOUS | Status: AC
  Filled 2022-03-18: qty 10

## 2022-03-18 MED ORDER — FENTANYL CITRATE PF 50 MCG/ML IJ SOSY
200.0000 ug | PREFILLED_SYRINGE | Freq: Once | INTRAMUSCULAR | Status: AC
  Administered 2022-03-18: 200 ug via INTRAVENOUS
  Filled 2022-03-18: qty 4

## 2022-03-18 MED ORDER — ETOMIDATE 2 MG/ML IV SOLN
20.0000 mg | Freq: Once | INTRAVENOUS | Status: AC
  Administered 2022-03-18: 20 mg via INTRAVENOUS
  Filled 2022-03-18: qty 10

## 2022-03-18 MED ORDER — LIDOCAINE HCL (PF) 1 % IJ SOLN
INTRAMUSCULAR | Status: AC
  Administered 2022-03-18: 5 mL via INTRADERMAL
  Filled 2022-03-18: qty 5

## 2022-03-18 MED ORDER — LIDOCAINE HCL 1 % IJ SOLN
5.0000 mL | Freq: Once | INTRAMUSCULAR | Status: AC
Start: 2022-03-18 — End: 2022-03-18
  Filled 2022-03-18: qty 5

## 2022-03-18 MED ORDER — MIDAZOLAM HCL 2 MG/2ML IJ SOLN
5.0000 mg | Freq: Once | INTRAMUSCULAR | Status: AC
  Administered 2022-03-18: 4 mg via INTRAVENOUS
  Filled 2022-03-18: qty 6

## 2022-03-18 MED ORDER — MAGNESIUM SULFATE 2 GM/50ML IV SOLN
2.0000 g | Freq: Once | INTRAVENOUS | Status: AC
  Administered 2022-03-18: 2 g via INTRAVENOUS
  Filled 2022-03-18: qty 50

## 2022-03-18 MED ORDER — ROCURONIUM BROMIDE 10 MG/ML (PF) SYRINGE
PREFILLED_SYRINGE | INTRAVENOUS | Status: AC
  Administered 2022-03-18: 100 mg via INTRAVENOUS
  Filled 2022-03-18: qty 10

## 2022-03-18 NOTE — Procedures (Signed)
Percutaneous Tracheostomy Procedure Note   Kionna ZANNIE LOCASTRO  [id number]  1951-05-11  Date:03/18/22  Time:3:05 PM   Provider Performing:Braylen Denunzio  Procedure: Percutaneous Tracheostomy with Bronchoscopic Guidance (31600)  Indication(s) Acute respiratory failure  Consent Risks of the procedure as well as the alternatives and risks of each were explained to the patient and/or caregiver.  Consent for the procedure was obtained.  Anesthesia Etomidate, Versed, Fentanyl, Vecuronium   Time Out Verified patient identification, verified procedure, site/side was marked, verified correct patient position, special equipment/implants available, medications/allergies/relevant history reviewed, required imaging and test results available.   Sterile Technique Maximal sterile technique including sterile barrier drape, hand hygiene, sterile gown, sterile gloves, mask, hair covering.    Procedure Description Appropriate anatomy identified by palpation.  Patient's neck prepped and draped in sterile fashion.  1% lidocaine with epinephrine was used to anesthetize skin overlying neck.  1.5cm incision made and blunt dissection performed until tracheal rings could be easily palpated.   Then a size 6 Shiley tracheostomy was placed under bronchoscopic visualization using usual Seldinger technique and serial dilation.   Bronchoscope confirmed placement above the carina.  Tracheostomy was sutured in place with adhesive pad to protect skin under pressure.    Patient connected to ventilator.   Complications/Tolerance None; patient tolerated the procedure well. Chest X-ray is ordered to confirm no post-procedural complication.   EBL Minimal   Specimen(s) None

## 2022-03-18 NOTE — Progress Notes (Signed)
Physical Therapy Treatment Patient Details Name: OLA RAAP MRN: [id number] DOB: 1951-05-18 Today's Date: 03/18/2022   History of Present Illness 71 y.o. female admitted 03/11/22 with chest pain, abdominal pain, SOB, FTT; admit to ICU with concern for possible aortic dissection. Pt with hypotension and AMS 6/14; ETT 6/14-6/15. CTA 6/14 with no evidence of aortic dissection. 6/15 code blue called due to unresponsiveness/bradycardia, concern for seizure; reintubated; LTM EEG without definitive seizures. 6/19 extubated and reintubated within the hour. PMHx:HTN, orthostatic hypotension, sickle cell trait, arthritis.    PT Comments    Pt very pleasant, following commands and happy to get OOB to chair. Pt tolerating mobility well on vent with ETT secured and difficulty with saliva management. Pt fatigues quickly with standing tolerance and educated for seated HEp and progression. Goals downgraded with D/c plan updated. Will continue to follow.   PRVC 40%, peep 5, RR 22, SpO2 95-100% HR 74   Recommendations for follow up therapy are one component of a multi-disciplinary discharge planning process, led by the attending physician.  Recommendations may be updated based on patient status, additional functional criteria and insurance authorization.  Follow Up Recommendations  PT at [facility]-term acute care hospital     Assistance Recommended at Discharge Frequent or constant Supervision/Assistance  Patient can return home with the following A lot of help with walking and/or transfers;A little help with bathing/dressing/bathroom;Assistance with cooking/housework;Assist for transportation   Equipment Recommendations  Rolling walker (2 wheels)    Recommendations for Other Services       Precautions / Restrictions Precautions Precautions: Fall;Other (comment) Precaution Comments: vent, ETT, cortrak Restrictions Weight Bearing Restrictions: No     Mobility  Bed Mobility Overal bed  mobility: Needs Assistance Bed Mobility: Rolling, Sidelying to Sit Rolling: Min assist Sidelying to sit: Min assist       General bed mobility comments: min assist to roll, rise from side and manage all lines. good sitting balance    Transfers Overall transfer level: Needs assistance   Transfers: Sit to/from Stand, Bed to chair/wheelchair/BSC Sit to Stand: Min assist Stand pivot transfers: Min assist         General transfer comment: min assist with bil UE support with face to face technique able to rise from bed and pivot to chair. Pt denied further standing trials    Ambulation/Gait               General Gait Details: currently unable due to fatigue while intubated   Stairs             Wheelchair Mobility    Modified Rankin (Stroke Patients Only)       Balance Overall balance assessment: Needs assistance   Sitting balance-Leahy Scale: Good Sitting balance - Comments: static sitting without support   Standing balance support: Bilateral upper extremity supported Standing balance-Leahy Scale: Poor Standing balance comment: light bil UE support for standing                            Cognition Arousal/Alertness: Awake/alert Behavior During Therapy: Flat affect Overall Cognitive Status: Within Functional Limits for tasks assessed                                 General Comments: pt following all commands and providing gestural and written responses        Exercises General Exercises - Lower Extremity Long Arc  Quad: AROM, Both, Seated, 20 reps Hip ABduction/ADduction: AROM, Both, Seated, 15 reps Hip Flexion/Marching: AROM, Both, 15 reps, Seated    General Comments        Pertinent Vitals/Pain Pain Assessment Pain Assessment: No/denies pain    Home Living                          Prior Function            PT Goals (current goals can now be found in the care plan section) Acute Rehab PT  Goals Time For Goal Achievement: 04/01/22 Potential to Achieve Goals: Good Progress towards PT goals: Goals downgraded-see care plan    Frequency    Min 3X/week      PT Plan Current plan remains appropriate    Co-evaluation              AM-PAC PT "6 Clicks" Mobility   Outcome Measure  Help needed turning from your back to your side while in a flat bed without using bedrails?: A Little Help needed moving from lying on your back to sitting on the side of a flat bed without using bedrails?: A Little Help needed moving to and from a bed to a chair (including a wheelchair)?: A Little Help needed standing up from a chair using your arms (e.g., wheelchair or bedside chair)?: Total Help needed to walk in hospital room?: Total Help needed climbing 3-5 steps with a railing? : Total 6 Click Score: 12    End of Session   Activity Tolerance: Patient tolerated treatment well Patient left: in chair;with call bell/phone within reach;with chair alarm set Nurse Communication: Mobility status PT Visit Diagnosis: Other abnormalities of gait and mobility (R26.89);Difficulty in walking, not elsewhere classified (R26.2);Muscle weakness (generalized) (M62.81)     Time: 7353-2992 PT Time Calculation (min) (ACUTE ONLY): 22 min  Charges:  $Therapeutic Activity: 8-22 mins                     Bayard Males, PT Acute Rehabilitation Services Office: [phone]    Lamarr Lulas 03/18/2022, 11:37 AM

## 2022-03-18 NOTE — Procedures (Signed)
Diagnostic Bronchoscopy  Sheila Fernandez  [id number]  Sep 08, 1951  Date:03/18/22  Time:3:05 PM   Provider Performing:Brooke Moshe Cipro   Procedure: Diagnostic Bronchoscopy (74128)  Indication(s) Assist with direct visualization of tracheostomy placement  Consent Risks of the procedure as well as the alternatives and risks of each were explained to the patient and/or caregiver.  Consent for the procedure was obtained.   Anesthesia See separate tracheostomy note   Time Out Verified patient identification, verified procedure, site/side was marked, verified correct patient position, special equipment/implants available, medications/allergies/relevant history reviewed, required imaging and test results available.   Sterile Technique Usual hand hygiene, masks, gowns, and gloves were used   Procedure Description Bronchoscope advanced through endotracheal tube and into airway.  After suctioning out tracheal secretions, bronchoscope used to provide direct visualization of tracheostomy placement.   Complications/Tolerance None; patient tolerated the procedure well.   EBL None  Specimen(s) None     Sheila Fernandez, ACNP [facility] 03/18/2022, 3:06 PM  See Amion for pager If no response to pager, please call PCCM consult pager After 7:00 pm call Elink

## 2022-03-18 NOTE — Progress Notes (Signed)
OT Cancellation Note  Patient Details Name: Sheila Fernandez MRN: [id number] DOB: 1951-03-07   Cancelled Treatment:    Reason Eval/Treat Not Completed: Patient not medically ready. Pt extubated then re-intubated 6/19 due to medical decline. Will assess when medically appropriate.   Takuma Cifelli,HILLARY 03/18/2022, 8:32 AM Maurie Boettcher, OT/L   Acute OT Clinical Specialist Acute Rehabilitation Services Pager [phone] Office [phone]

## 2022-03-18 NOTE — Progress Notes (Signed)
Sheila Fernandez, MRN:  [id number], DOB:  07-24-1951, LOS: 7 ADMISSION DATE:  03/11/2022, CONSULTATION DATE:  03/11/22 REFERRING MD:  Darl Householder, CHIEF COMPLAINT:  Chest pain, abdominal pain  History of Present Illness:  71 yo woman with chest pain, abdominal pain, SOB. Sudden onset epigastric pain radiating to back since yesterday.  BP elevated always, missed her bp meds this am. CT chest several months ago, mass vs atelectasis.  Here with mid abdominal pain since yesterday.  Low back pain.  SOB, fatigue x 3 months Fatigue and weight loss x 6 months.  Recent abdominal pain, started on protonix    Admitted to ICU, CT abdomen/pelvis negative for AAS, initially on esmolol infusion then required Levophed for hypotension and intubation for encephalopathy. Spot EEG and head CT were negative.  She was extubated 6/14 and transferred out of the ICU.    Overnight on 6/15, pt was ambulating and conversing with family when she suddenly became restless and then altered and had an episode of bradycardia to the 40's.  A code blue was called, but pt never lost pulses, she did require bagging for a short time.  Her HR improved without medications and she was responding to some commands, but had upwardly deviated gaze and flexion of the R arm.  She was transferred to the ICU and re-intubated and loaded with Keppra   Pertinent  Medical History  HTN, Orthostatic hypotension, Anemia, Arthritis, Iritis, Recurrent Sickle cell trait  Echocardiogram 12/10/2021: Left ventricle cavity is normal in size and wall thickness. Normal global wall motion. Normal LV systolic function with EF 61%. Doppler evidence of grade I (impaired) diastolic dysfunction, normal LAP. Mild mitral valve stenosis. Mild tricuspid regurgitation. IVC not seen. No significant change compared to previous study in 2019.  Lexiscan Tetrofosmin stress test 02/01/2022:Normal myocardial perfusion. Stress LVEF 57%.Low risk study.  Significant  Hospital Events: Including procedures, antibiotic start and stop dates in addition to other pertinent events   6/13 admitted to ICU for esmolol gtt with question of possible dissection 6/14 hypotensive, AMS on esmolol 300. Intubated. Starting NE. CT H neg. CTA no AAS. BAL: cytology neg.  Extubate 6/15 6/15-16 night Episode of AMS and bradycardia, concern for sz, re-intubated and loaded with Keppra 6/16 still no sz on EEG. MRI c spine and brain: and without contrast. Mild foraminal narrowing bilaterally at C2-3.Severe left and moderate right foraminal stenosis at C3-4.Moderate foraminal narrowing at C4-5 is worse left than right.  Moderate right and mild left foraminal narrowing at C5-6.Mild left foraminal narrowing at C6-7. No sig change compared w/ MRI 4/11; No acute intracranial abnormality or significant interval change. 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 This likely reflects the sequela of chronic microvascular ischemia. 6/17 failed SBT attempt. PCXR w/ right sided PNA. Cultures sent. Unasyn started.  6/18 continued to spike fever despite addition of Unasyn, switched to [place] 6/19 extubated, had to be reintubated for hypercapnia   Interim History / Subjective:  Patient was extubated yesterday, she tolerated well for about an hour before became unresponsive ABG was done, showing severe hypercapnia and respiratory acidosis with pH 6.98 Patient had to be reintubated  Objective   Blood pressure 96/67, pulse 78, temperature 98.7 F (37.1 C), temperature source Axillary, resp. rate (!) 22, height _0  (1.549 m), weight 55.2 kg, SpO2 100 %.    Vent Mode: PRVC FiO2 (%):  [40 %-100 %] 40 % Set Rate:  [16 bmp-30 bmp] 19  bmp Vt Set:  [380 mL] 380 mL PEEP:  [5 cmH20] 5 cmH20 Pressure Support:  [8 cmH20] 8 cmH20 Plateau Pressure:  [16 cmH20-17 cmH20] 16 cmH20   Intake/Output Summary (Last 24 hours) at 03/18/2022 0912 Last data filed  at 03/18/2022 0800 Gross per 24 hour  Intake 4397.55 ml  Output 3435 ml  Net 962.55 ml   Filed Weights   03/16/22 0500 03/17/22 0500 03/18/22 0500  Weight: 57.6 kg 55 kg 55.2 kg     Physical exam: General: Crtitically ill-appearing female, orally intubated HEENT: [place]/AT, eyes anicteric.  ETT and OGT in place Neuro: Awake, following commands, moving all 4 EXTR Chest: Coarse breath sounds, no wheezes or rhonchi Heart: Regular rate and rhythm, no murmurs or gallops Abdomen: Soft, nontender, nondistended, bowel sounds present Skin: No rash  Resolved Hospital Problem list   AKI   Assessment & Plan:  Probable autonomic dysfunction manifested with severe symptomatic bradycardia and hypotension  Patient with history of orthostatic hypotension, being worked up as an outpatient  She became bradycardic while ambulating this time  No more episodes of bradycardia since she has been in ICU Continue telemetry monitoring Hold AV nodal blocking agents including beta-blocker and calcium channel blocker Remain on very low-dose vasopressor, currently at 1 mic of Levophed, titrate with map goal 65 We will ask EP/cardiology consult postextubation Patient follows Dr. Einar Gip as an outpatient  Acute metabolic encephalopathy due to CO2 narcosis, improved Mental status has improved significantly after clearance of CO2 EEG was negative for seizures Appreciate neurology recommendations, recommend continuing Keppra for now Continue neuro monitoring  Acute hypoxic/hypercapnic respiratory failure requiring second time reintubation c/b probable aspiration PNA Patient was extubated yesterday, had to be reintubated because of severe hypercapnia leading to acute respiratory acidosis Her mental status got poor because of CO2 narcosis She became afebrile this morning She continued to have moderate amount of thick yellowish secretion via ET tube Respiratory culture is growing gram-positive cocci in pairs,  sensitivities are pending Continue IV Zosyn to complete 7-day therapy Follow-up sensitivity result and adjust antibiotic accordingly VAP bundle   Hypernatremia, hypokalemia  Serum sodium is back up again to 147 today Continue free water flushes Continue aggressive potassium replacement and monitor  Cervical radiculopathy  On recent EMG -- Acute on chronic C8 radiculopathy (severe), chornic C5 radiculopathy (mild-mod) -- affecting BUE  Carpal tunnel.   F/u outpt  Additional recs per neuro   Acute on anemia of chronic disease Hx sickle cell trait; hgb went down to 6.6  Received 1 unit PRBC 2 days ago Now hemoglobin is stable between 7 and 8 Monitor H&H and transfuse if less than 7  Constipation Continue aggressive bowel regimen  Severe protein calorie malnutrition Continue dietary supplements  Best Practice (right click and "Reselect all SmartList Selections" daily)   Diet/type: tubefeeds DVT prophylaxis: SCD GI prophylaxis: PPI Lines: Central line Foley:  N/A Code Status:  full code Last date of multidisciplinary goals of care discussion [ 6/16 son, 2 daughters updated at bedside 6/19) ] Family meeting is a scheduled for today at 5 AM    Total critical care time: 38 minutes  Performed by: Jacky Kindle   Critical care time was exclusive of separately billable procedures and treating other patients.   Critical care was necessary to treat or prevent imminent or life-threatening deterioration.   Critical care was time spent personally by me on the following activities: development of treatment plan with patient and/or surrogate as well as nursing, discussions  with consultants, evaluation of patient's response to treatment, examination of patient, obtaining history from patient or surrogate, ordering and performing treatments and interventions, ordering and review of laboratory studies, ordering and review of radiographic studies, pulse oximetry and re-evaluation of  patient's condition.   Jacky Kindle, MD [facility] See Amion for pager If no response to pager, please call [phone] until 7pm After 7pm, Please call E-link [phone]

## 2022-03-19 DIAGNOSIS — G9340 Encephalopathy, unspecified: Secondary | ICD-10-CM | POA: Diagnosis not present

## 2022-03-19 DIAGNOSIS — E43 Unspecified severe protein-calorie malnutrition: Secondary | ICD-10-CM | POA: Diagnosis not present

## 2022-03-19 DIAGNOSIS — R569 Unspecified convulsions: Secondary | ICD-10-CM | POA: Diagnosis not present

## 2022-03-19 DIAGNOSIS — J9601 Acute respiratory failure with hypoxia: Secondary | ICD-10-CM | POA: Diagnosis not present

## 2022-03-19 LAB — GLUCOSE, CAPILLARY
Glucose-Capillary: 75 mg/dL (ref 70–99)
Glucose-Capillary: 139 mg/dL — ABNORMAL HIGH (ref 70–99)
Glucose-Capillary: 111 mg/dL — ABNORMAL HIGH (ref 70–99)
Glucose-Capillary: 112 mg/dL — ABNORMAL HIGH (ref 70–99)
Glucose-Capillary: 107 mg/dL — ABNORMAL HIGH (ref 70–99)
Glucose-Capillary: 122 mg/dL — ABNORMAL HIGH (ref 70–99)

## 2022-03-19 LAB — CULTURE, RESPIRATORY W GRAM STAIN: Gram Stain: NONE SEEN

## 2022-03-19 LAB — COMPREHENSIVE METABOLIC PANEL
ALT: 24 U/L (ref 0–44)
AST: 27 U/L (ref 15–41)
Albumin: 1.9 g/dL — ABNORMAL LOW (ref 3.5–5.0)
Alkaline Phosphatase: 47 U/L (ref 38–126)
Anion gap: 5 (ref 5–15)
BUN: 25 mg/dL — ABNORMAL HIGH (ref 8–23)
CO2: 29 mmol/L (ref 22–32)
Calcium: 8.3 mg/dL — ABNORMAL LOW (ref 8.9–10.3)
Chloride: 110 mmol/L (ref 98–111)
Creatinine, Ser: 0.87 mg/dL (ref 0.44–1.00)
GFR, Estimated: >60 mL/min (ref >60)
Glucose, Bld: 168 mg/dL — ABNORMAL HIGH (ref 70–99)
Potassium: 3.7 mmol/L (ref 3.5–5.1)
Sodium: 144 mmol/L (ref 135–145)
Total Bilirubin: 0.8 mg/dL (ref 0.3–1.2)
Total Protein: 5.5 g/dL — ABNORMAL LOW (ref 6.5–8.1)

## 2022-03-19 LAB — CBC WITH DIFFERENTIAL/PLATELET
Abs Immature Granulocytes: 0.04 10*3/uL (ref 0.00–0.07)
Basophils Absolute: 0 10*3/uL (ref 0.0–0.1)
Basophils Relative: 0 %
Eosinophils Absolute: 0.2 10*3/uL (ref 0.0–0.5)
Eosinophils Relative: 4 %
HCT: 21.5 % — ABNORMAL LOW (ref 36.0–46.0)
Hemoglobin: 7 g/dL — ABNORMAL LOW (ref 12.0–15.0)
Immature Granulocytes: 1 %
Lymphocytes Relative: 28 %
Lymphs Abs: 1.7 10*3/uL (ref 0.7–4.0)
MCH: 29.2 pg (ref 26.0–34.0)
MCHC: 32.6 g/dL (ref 30.0–36.0)
MCV: 89.6 fL (ref 80.0–100.0)
Monocytes Absolute: 0.5 10*3/uL (ref 0.1–1.0)
Monocytes Relative: 8 %
Neutro Abs: 3.7 10*3/uL (ref 1.7–7.7)
Neutrophils Relative %: 59 %
Platelets: 162 10*3/uL (ref 150–400)
RBC: 2.4 MIL/uL — ABNORMAL LOW (ref 3.87–5.11)
RDW: 14.6 % (ref 11.5–15.5)
WBC: 6.2 10*3/uL (ref 4.0–10.5)
nRBC: 0 % (ref 0.0–0.2)

## 2022-03-19 LAB — MAGNESIUM: Magnesium: 2.3 mg/dL (ref 1.7–2.4)

## 2022-03-19 MED ORDER — FENTANYL CITRATE PF 50 MCG/ML IJ SOSY: 25.0000 ug | PREFILLED_SYRINGE | Freq: Four times a day (QID) | INTRAMUSCULAR | Status: DC

## 2022-03-19 MED ORDER — BETHANECHOL CHLORIDE 10 MG PO TABS
10.0000 mg | ORAL_TABLET | Freq: Three times a day (TID) | ORAL | Status: DC
  Administered 2022-03-19 – 2022-03-20 (×5): 10 mg
  Filled 2022-03-19 (×6): qty 1

## 2022-03-19 MED ORDER — PHENOL 1.4 % MT LIQD
1.0000 | OROMUCOSAL | Status: DC | PRN
Start: 2022-03-19 — End: 2022-04-08
  Administered 2022-03-20 – 2022-03-25 (×2): 1 via OROMUCOSAL
  Filled 2022-03-19: qty 177

## 2022-03-19 MED ORDER — POTASSIUM CHLORIDE 20 MEQ PO PACK
40.0000 meq | PACK | Freq: Once | ORAL | Status: AC
  Administered 2022-03-19: 40 meq
  Filled 2022-03-19: qty 2

## 2022-03-19 MED ORDER — FENTANYL CITRATE PF 50 MCG/ML IJ SOSY
25.0000 ug | PREFILLED_SYRINGE | Freq: Four times a day (QID) | INTRAMUSCULAR | Status: DC | PRN
  Administered 2022-03-19: 25 ug via INTRAVENOUS
  Administered 2022-03-21: 50 ug via INTRAVENOUS
  Administered 2022-03-21: 25 ug via INTRAVENOUS
  Filled 2022-03-19 (×4): qty 1

## 2022-03-19 NOTE — Progress Notes (Signed)
[facility] ADULT ICU REPLACEMENT PROTOCOL   The patient does apply for the [facility] Adult ICU Electrolyte Replacment Protocol based on the criteria listed below:   1.Exclusion criteria: TCTS patients, ECMO patients, and Dialysis patients 2. Is GFR >/= 30 ml/min? Yes.    Patient's GFR today is >60 3. Is SCr </= 2? Yes.   Patient's SCr is 0.87 mg/dL 4. Did SCr increase >/= 0.5 in 24 hours? No. 5.Pt's weight >40kg  Yes.   6. Abnormal electrolyte(s): K+ 3.7  7. Electrolytes replaced per protocol 8.  Call MD STAT for K+ </= 2.5, Phos </= 1, or Mag </= 1 Physician:  n/a  Sheila Fernandez 03/19/2022 3:54 AM

## 2022-03-19 NOTE — Progress Notes (Signed)
eLink Physician-Brief Progress Note Patient Name: Sheila Fernandez DOB: 1950/11/02 MRN: [id number]   Date of Service  03/19/2022  HPI/Events of Note  Patient with posterior pharyngeal irritation secondary to Cortrak enteral tube.  eICU Interventions  PRN Chloraseptic spray ordered.        Sheila Fernandez 03/19/2022, 10:14 PM

## 2022-03-19 NOTE — Progress Notes (Signed)
NAME:  Sheila Fernandez, MRN:  [id number], DOB:  07-22-1951, LOS: 8 ADMISSION DATE:  03/11/2022, CONSULTATION DATE:  03/11/22 REFERRING MD:  Darl Householder, CHIEF COMPLAINT:  Chest pain, abdominal pain  History of Present Illness:  71 yo woman with chest pain, abdominal pain, SOB. Sudden onset epigastric pain radiating to back since yesterday.  BP elevated always, missed her bp meds this am. CT chest several months ago, mass vs atelectasis.  Here with mid abdominal pain since yesterday.  Low back pain.  SOB, fatigue x 3 months Fatigue and weight loss x 6 months.  Recent abdominal pain, started on protonix    Admitted to ICU, CT abdomen/pelvis negative for AAS, initially on esmolol infusion then required Levophed for hypotension and intubation for encephalopathy. Spot EEG and head CT were negative.  She was extubated 6/14 and transferred out of the ICU.    Overnight on 6/15, pt was ambulating and conversing with family when she suddenly became restless and then altered and had an episode of bradycardia to the 40's.  A code blue was called, but pt never lost pulses, she did require bagging for a short time.  Her HR improved without medications and she was responding to some commands, but had upwardly deviated gaze and flexion of the R arm.  She was transferred to the ICU and re-intubated and loaded with Keppra   Pertinent  Medical History  HTN, Orthostatic hypotension, Anemia, Arthritis, Iritis, Recurrent Sickle cell trait  Echocardiogram 12/10/2021: Left ventricle cavity is normal in size and wall thickness. Normal global wall motion. Normal LV systolic function with EF 61%. Doppler evidence of grade I (impaired) diastolic dysfunction, normal LAP. Mild mitral valve stenosis. Mild tricuspid regurgitation. IVC not seen. No significant change compared to previous study in 2019.  Lexiscan Tetrofosmin stress test 02/01/2022:Normal myocardial perfusion. Stress LVEF 57%.Low risk study.  Significant  Hospital Events: Including procedures, antibiotic start and stop dates in addition to other pertinent events   6/13 admitted to ICU for esmolol gtt with question of possible dissection 6/14 hypotensive, AMS on esmolol 300. Intubated. Starting NE. CT H neg. CTA no AAS. BAL: cytology neg.  Extubate 6/15 6/15-16 night Episode of AMS and bradycardia, concern for sz, re-intubated and loaded with Keppra 6/16 still no sz on EEG. MRI c spine and brain: and without contrast. Mild foraminal narrowing bilaterally at C2-3.Severe left and moderate right foraminal stenosis at C3-4.Moderate foraminal narrowing at C4-5 is worse left than right.  Moderate right and mild left foraminal narrowing at C5-6.Mild left foraminal narrowing at C6-7. No sig change compared w/ MRI 4/11; No acute intracranial abnormality or significant interval change. 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 This likely reflects the sequela of chronic microvascular ischemia. 6/17 failed SBT attempt. PCXR w/ right sided PNA. Cultures sent. Unasyn started.  6/18 continued to spike fever despite addition of Unasyn, switched to [place] 6/19 extubated, had to be reintubated for hypercapnia 6/20: Trached   Interim History / Subjective:  Patient underwent tracheostomy yesterday, tolerated procedure well Currently on pressure support 5/12 Sedation was turned off Remained afebrile White count is Trending down  Objective   Blood pressure 96/67, pulse (!) 55, temperature 98.6 F (37 C), temperature source Oral, resp. rate (!) 22, height _0  (1.549 m), weight 55.5 kg, SpO2 100 %.    Vent Mode: PRVC FiO2 (%):  [40 %] 40 % Set Rate:  [19 bmp-22 bmp] 22 bmp Vt Set:  [380 mL]  380 mL PEEP:  [5 cmH20] 5 cmH20 Plateau Pressure:  [15 cmH20-18 cmH20] 17 cmH20   Intake/Output Summary (Last 24 hours) at 03/19/2022 4854 Last data filed at 03/19/2022 0900 Gross per 24 hour  Intake 2499.03 ml   Output 1445 ml  Net 1054.03 ml   Filed Weights   03/17/22 0500 03/18/22 0500 03/19/22 0500  Weight: 55 kg 55.2 kg 55.5 kg     Physical exam: General: Crtitically ill-appearing female, s/p trach HEENT: [place]/AT, eyes anicteric.  Cortrak in place Neuro: Awake, following commands, moving all 4 EXTR Chest: Coarse breath sounds, no wheezes or rhonchi Heart: Regular rate and rhythm, no murmurs or gallops Abdomen: Soft, nontender, nondistended, bowel sounds present Skin: No rash  Resolved Hospital Problem list   AKI   Assessment & Plan:  Probable autonomic dysfunction manifested with severe symptomatic bradycardia and hypotension  Patient with history of orthostatic hypotension, being worked up as an outpatient  She became bradycardic while ambulating few days ago No more episodes of bradycardia or hypotension since she has been in ICU Continue telemetry monitoring Hold AV nodal blocking agents including beta-blocker and calcium channel blocker Came off of vasopressors Outpatient follow-up with cardiology  Acute metabolic encephalopathy due to CO2 narcosis, improved Mental status has improved significantly after clearance of CO2 EEG was negative for seizures Appreciate neurology recommendations, recommend continuing Keppra for now Continue neuro monitoring  Acute hypoxic/hypercapnic respiratory failure s/p trach after failing multiple extubation trial Pseudomonas pneumonia  Patient had multiple episodes of unresponsiveness, on twice it was noted patient PCO2 was  > 123 Patient underwent tracheostomy yesterday, tolerated procedure well Now tolerating spontaneous breathing trial 5/12 Off sedation Still has small amount of thick secretions Respiratory culture is growing Pseudomonas Continue IV Zosyn to complete 7-day therapy VAP bundle   Hypernatremia, hypokalemia  Serum sodium trended down to 144 Continue free water flushes Continue aggressive potassium replacement and  monitor  Cervical radiculopathy  On recent EMG -- Acute on chronic C8 radiculopathy (severe), chornic C5 radiculopathy (mild-mod) -- affecting BUE  Carpal tunnel.   F/u outpt  Additional recs per neuro   Acute on anemia of chronic disease Hx sickle cell trait; hgb went down to 6.6  Received 1 unit PRBC few days ago Now hemoglobin is stable around 7 Monitor H&H and transfuse if less than 7  Constipation Continue aggressive bowel regimen  Severe protein calorie malnutrition Continue dietary supplements  Best Practice (right click and "Reselect all SmartList Selections" daily)   Diet/type: tubefeeds DVT prophylaxis: SCD GI prophylaxis: PPI Lines: Central line Foley:  N/A Code Status:  full code Last date of multidisciplinary goals of care discussion [ 6/16 son, 2 daughters updated at bedside 6/20) ] Patient's family stated that they would like full scope of care including tracheostomy    Total critical care time: 33 minutes  Performed by: Jacky Kindle   Critical care time was exclusive of separately billable procedures and treating other patients.   Critical care was necessary to treat or prevent imminent or life-threatening deterioration.   Critical care was time spent personally by me on the following activities: development of treatment plan with patient and/or surrogate as well as nursing, discussions with consultants, evaluation of patient's response to treatment, examination of patient, obtaining history from patient or surrogate, ordering and performing treatments and interventions, ordering and review of laboratory studies, ordering and review of radiographic studies, pulse oximetry and re-evaluation of patient's condition.   Jacky Kindle, MD [facility] See  Amion for pager If no response to pager, please call [phone] until 7pm After 7pm, Please call E-link [phone]

## 2022-03-20 DIAGNOSIS — R001 Bradycardia, unspecified: Secondary | ICD-10-CM | POA: Diagnosis not present

## 2022-03-20 DIAGNOSIS — J9601 Acute respiratory failure with hypoxia: Secondary | ICD-10-CM | POA: Diagnosis not present

## 2022-03-20 DIAGNOSIS — E43 Unspecified severe protein-calorie malnutrition: Secondary | ICD-10-CM | POA: Diagnosis not present

## 2022-03-20 DIAGNOSIS — G9340 Encephalopathy, unspecified: Secondary | ICD-10-CM | POA: Diagnosis not present

## 2022-03-20 LAB — CBC WITH DIFFERENTIAL/PLATELET
Abs Immature Granulocytes: 0.06 10*3/uL (ref 0.00–0.07)
Basophils Absolute: 0 10*3/uL (ref 0.0–0.1)
Basophils Relative: 0 %
Eosinophils Absolute: 0.2 10*3/uL (ref 0.0–0.5)
Eosinophils Relative: 4 %
HCT: 23 % — ABNORMAL LOW (ref 36.0–46.0)
Hemoglobin: 7.1 g/dL — ABNORMAL LOW (ref 12.0–15.0)
Immature Granulocytes: 1 %
Lymphocytes Relative: 32 %
Lymphs Abs: 1.8 10*3/uL (ref 0.7–4.0)
MCH: 28.2 pg (ref 26.0–34.0)
MCHC: 30.9 g/dL (ref 30.0–36.0)
MCV: 91.3 fL (ref 80.0–100.0)
Monocytes Absolute: 0.5 10*3/uL (ref 0.1–1.0)
Monocytes Relative: 9 %
Neutro Abs: 3 10*3/uL (ref 1.7–7.7)
Neutrophils Relative %: 54 %
Platelets: 197 10*3/uL (ref 150–400)
RBC: 2.52 MIL/uL — ABNORMAL LOW (ref 3.87–5.11)
RDW: 14.7 % (ref 11.5–15.5)
WBC: 5.5 10*3/uL (ref 4.0–10.5)
nRBC: 0 % (ref 0.0–0.2)

## 2022-03-20 LAB — CULTURE, RESPIRATORY W GRAM STAIN

## 2022-03-20 LAB — GLUCOSE, CAPILLARY
Glucose-Capillary: 117 mg/dL — ABNORMAL HIGH (ref 70–99)
Glucose-Capillary: 117 mg/dL — ABNORMAL HIGH (ref 70–99)
Glucose-Capillary: 121 mg/dL — ABNORMAL HIGH (ref 70–99)
Glucose-Capillary: 127 mg/dL — ABNORMAL HIGH (ref 70–99)
Glucose-Capillary: 90 mg/dL (ref 70–99)
Glucose-Capillary: 93 mg/dL (ref 70–99)

## 2022-03-20 LAB — COMPREHENSIVE METABOLIC PANEL
ALT: 61 U/L — ABNORMAL HIGH (ref 0–44)
AST: 68 U/L — ABNORMAL HIGH (ref 15–41)
Albumin: 2.1 g/dL — ABNORMAL LOW (ref 3.5–5.0)
Alkaline Phosphatase: 64 U/L (ref 38–126)
Anion gap: 5 (ref 5–15)
BUN: 22 mg/dL (ref 8–23)
CO2: 27 mmol/L (ref 22–32)
Calcium: 8.7 mg/dL — ABNORMAL LOW (ref 8.9–10.3)
Chloride: 111 mmol/L (ref 98–111)
Creatinine, Ser: 0.75 mg/dL (ref 0.44–1.00)
GFR, Estimated: >60 mL/min (ref >60)
Glucose, Bld: 117 mg/dL — ABNORMAL HIGH (ref 70–99)
Potassium: 4.2 mmol/L (ref 3.5–5.1)
Sodium: 143 mmol/L (ref 135–145)
Total Bilirubin: 0.6 mg/dL (ref 0.3–1.2)
Total Protein: 5.6 g/dL — ABNORMAL LOW (ref 6.5–8.1)

## 2022-03-20 MED ORDER — FREE WATER
200.0000 mL | Freq: Four times a day (QID) | Status: DC
  Administered 2022-03-20 – 2022-03-21 (×4): 200 mL

## 2022-03-20 NOTE — Evaluation (Signed)
[facility] - Evaluation Patient Details  Name: Sheila Fernandez MRN: [id number] Date of Birth: [birth year]  Today's Date: 03/20/2022 Time: 1033-1103 SLP Time Calculation (min) (ACUTE ONLY): 30 min  Past Medical History:  Past Medical History:  Diagnosis Date   Adenomyosis    Anemia    Arthritis    Hypertension    Iritis, recurrent    Orthostatic hypotension 01/21/2019   Sickle cell trait ([place])    Uterine fibroid    Past Surgical History:  Past Surgical History:  Procedure Laterality Date   BREAST BIOPSY Left 1986   benign excisional no scar seen    BREAST LUMPECTOMY Left    Pt does not recall having lumpectomy   CESAREAN SECTION     CHOLECYSTECTOMY     FOOT SURGERY     cyst removed   HPI:  72 y.o. female admitted 03/11/22 with chest pain, abdominal pain, SOB, FTT; admit to ICU with concern for possible aortic dissection. Pt with hypotension and AMS 6/14; ETT 6/14-6/15. CTA 6/14 with no evidence of aortic dissection. 6/15 code blue called due to unresponsiveness/bradycardia, concern for seizure; reintubated; LTM EEG without definitive seizures. 6/19 extubated and reintubated within the hour; trach 6/20.  PMHx:HTN, orthostatic hypotension, sickle cell trait, arthritis.    Assessment / Plan / Recommendation  Clinical Impression  Pt tolerated PMV with good outcome with RT present. She is alert, following commands with typical processing speed. Patent upper airway indicated by adequately lower Vte after RT deflated cuff. Coughed, self suctioned and deep suction via RT to assist in clearing secretions. She was able to expectorate moderate amount independently. Valve donned and coughing stopped after approximately 3-5 minutes with phonation intermittently. Mostly breathy verbalizations with true phonation achieved 30% of the time. Therapist cued for increased breath prior to phonating as she would exhale then attempt to speak (max cues fading to mod). RT switched pt to  PS/CPAP for 5 minutes then trialed her on trach colllar tolerating this well. Phonation remained mostly unchanged with intermittent adequate vocal cord adduction. RR ranged 18-23, HR 80-91 and SpO2 96-100%. Continue PMV with SLP and if she continues to do well with trach collar can adjust these recommendations. Swallow asssessment when appropriate. SLP Visit Diagnosis: Aphonia (R49.1)    SLP Assessment  Patient needs continued [facility]    Recommendations for follow up therapy are one component of a multi-disciplinary discharge planning process, led by the attending physician.  Recommendations may be updated based on patient status, additional functional criteria and insurance authorization.  Follow Up Recommendations  Acute inpatient rehab (3hours/day)    Assistance Recommended at Discharge Frequent or constant Supervision/Assistance  Functional Status Assessment Patient has had a recent decline in their functional status and demonstrates the ability to make significant improvements in function in a reasonable and predictable amount of time.  Frequency and Duration min 2x/week  2 weeks    PMSV Trial PMSV was placed for:  (22-25 min) Able to redirect subglottic air through upper airway: Yes Able to Attain Phonation: Yes Voice Quality: Breathy Able to Expectorate Secretions: Yes Level of Secretion Expectoration with PMSV: Oral Breath Support for Phonation: Mildly decreased Intelligibility: Intelligibility reduced Word: 50-74% accurate Phrase: 25-49% accurate Sentence: 0-24% accurate Respirations During Trial:  (18-23) SpO2 During Trial:  (96-100%) Pulse During Trial:  (80-91) Behavior: Alert;Controlled;Cooperative;Responsive to questions   Tracheostomy Tube       Vent Dependency  Vent Mode: PRVC Set Rate: 18 bmp PEEP: 5 cmH20 FiO2 (%):  28 % Vt Set: 380 mL    Cuff Deflation Trial Tolerated Cuff Deflation: Yes Length of Time for Cuff Deflation  Trial: 30 min Behavior: Alert;Controlled;Cooperative;Responsive to questions         Houston Siren 03/20/2022, 11:32 AM

## 2022-03-21 DIAGNOSIS — R001 Bradycardia, unspecified: Secondary | ICD-10-CM | POA: Diagnosis not present

## 2022-03-21 DIAGNOSIS — J9601 Acute respiratory failure with hypoxia: Secondary | ICD-10-CM | POA: Diagnosis not present

## 2022-03-21 DIAGNOSIS — G9340 Encephalopathy, unspecified: Secondary | ICD-10-CM | POA: Diagnosis not present

## 2022-03-21 DIAGNOSIS — E43 Unspecified severe protein-calorie malnutrition: Secondary | ICD-10-CM | POA: Diagnosis not present

## 2022-03-21 LAB — PHOSPHORUS: Phosphorus: 3 mg/dL (ref 2.5–4.6)

## 2022-03-21 LAB — CULTURE, BLOOD (ROUTINE X 2)
Culture: NO GROWTH
Culture: NO GROWTH
Special Requests: ADEQUATE
Special Requests: ADEQUATE

## 2022-03-21 LAB — CBC WITH DIFFERENTIAL/PLATELET
Abs Immature Granulocytes: 0.1 10*3/uL — ABNORMAL HIGH (ref 0.00–0.07)
Basophils Absolute: 0 10*3/uL (ref 0.0–0.1)
Basophils Relative: 1 %
Eosinophils Absolute: 0.2 10*3/uL (ref 0.0–0.5)
Eosinophils Relative: 3 %
HCT: 24.8 % — ABNORMAL LOW (ref 36.0–46.0)
Hemoglobin: 7.5 g/dL — ABNORMAL LOW (ref 12.0–15.0)
Immature Granulocytes: 1 %
Lymphocytes Relative: 26 %
Lymphs Abs: 1.9 10*3/uL (ref 0.7–4.0)
MCH: 28.2 pg (ref 26.0–34.0)
MCHC: 30.2 g/dL (ref 30.0–36.0)
MCV: 93.2 fL (ref 80.0–100.0)
Monocytes Absolute: 0.5 10*3/uL (ref 0.1–1.0)
Monocytes Relative: 7 %
Neutro Abs: 4.5 10*3/uL (ref 1.7–7.7)
Neutrophils Relative %: 62 %
Platelets: 256 10*3/uL (ref 150–400)
RBC: 2.66 MIL/uL — ABNORMAL LOW (ref 3.87–5.11)
RDW: 14.8 % (ref 11.5–15.5)
WBC: 7.3 10*3/uL (ref 4.0–10.5)
nRBC: 0 % (ref 0.0–0.2)

## 2022-03-21 LAB — COMPREHENSIVE METABOLIC PANEL
ALT: 96 U/L — ABNORMAL HIGH (ref 0–44)
AST: 87 U/L — ABNORMAL HIGH (ref 15–41)
Albumin: 2.3 g/dL — ABNORMAL LOW (ref 3.5–5.0)
Alkaline Phosphatase: 66 U/L (ref 38–126)
Anion gap: 6 (ref 5–15)
BUN: 20 mg/dL (ref 8–23)
CO2: 27 mmol/L (ref 22–32)
Calcium: 8.8 mg/dL — ABNORMAL LOW (ref 8.9–10.3)
Chloride: 108 mmol/L (ref 98–111)
Creatinine, Ser: 0.72 mg/dL (ref 0.44–1.00)
GFR, Estimated: >60 mL/min (ref >60)
Glucose, Bld: 139 mg/dL — ABNORMAL HIGH (ref 70–99)
Potassium: 4.1 mmol/L (ref 3.5–5.1)
Sodium: 141 mmol/L (ref 135–145)
Total Bilirubin: 0.7 mg/dL (ref 0.3–1.2)
Total Protein: 6 g/dL — ABNORMAL LOW (ref 6.5–8.1)

## 2022-03-21 LAB — GLUCOSE, CAPILLARY
Glucose-Capillary: 136 mg/dL — ABNORMAL HIGH (ref 70–99)
Glucose-Capillary: 139 mg/dL — ABNORMAL HIGH (ref 70–99)
Glucose-Capillary: 159 mg/dL — ABNORMAL HIGH (ref 70–99)
Glucose-Capillary: 121 mg/dL — ABNORMAL HIGH (ref 70–99)
Glucose-Capillary: 131 mg/dL — ABNORMAL HIGH (ref 70–99)
Glucose-Capillary: 136 mg/dL — ABNORMAL HIGH (ref 70–99)

## 2022-03-21 LAB — MAGNESIUM: Magnesium: 2 mg/dL (ref 1.7–2.4)

## 2022-03-21 MED ORDER — HYDRALAZINE HCL 20 MG/ML IJ SOLN
10.0000 mg | Freq: Four times a day (QID) | INTRAMUSCULAR | Status: DC | PRN
Start: 2022-03-21 — End: 2022-04-08
  Administered 2022-03-21 – 2022-04-06 (×13): 10 mg via INTRAVENOUS
  Filled 2022-03-21 (×17): qty 1

## 2022-03-21 MED ORDER — BUSPIRONE HCL 10 MG PO TABS
10.0000 mg | ORAL_TABLET | Freq: Three times a day (TID) | ORAL | Status: DC
  Administered 2022-03-21 – 2022-03-22 (×5): 10 mg
  Filled 2022-03-21 (×5): qty 1

## 2022-03-21 MED ORDER — FREE WATER
100.0000 mL | Freq: Three times a day (TID) | Status: DC
Start: 2022-03-21 — End: 2022-03-22
  Administered 2022-03-21 (×2): 100 mL

## 2022-03-21 MED ORDER — BETHANECHOL CHLORIDE 10 MG PO TABS
5.0000 mg | ORAL_TABLET | Freq: Three times a day (TID) | ORAL | Status: DC
Start: 2022-03-21 — End: 2022-03-23
  Administered 2022-03-21 – 2022-03-22 (×6): 5 mg
  Filled 2022-03-21 (×5): qty 1

## 2022-03-21 NOTE — Plan of Care (Signed)

## 2022-03-22 ENCOUNTER — Inpatient Hospital Stay (HOSPITAL_COMMUNITY): Payer: Medicare Other

## 2022-03-22 DIAGNOSIS — J9601 Acute respiratory failure with hypoxia: Secondary | ICD-10-CM | POA: Diagnosis not present

## 2022-03-22 DIAGNOSIS — R001 Bradycardia, unspecified: Secondary | ICD-10-CM | POA: Diagnosis not present

## 2022-03-22 DIAGNOSIS — E43 Unspecified severe protein-calorie malnutrition: Secondary | ICD-10-CM | POA: Diagnosis not present

## 2022-03-22 DIAGNOSIS — G9340 Encephalopathy, unspecified: Secondary | ICD-10-CM | POA: Diagnosis not present

## 2022-03-22 LAB — CBC WITH DIFFERENTIAL/PLATELET
Abs Immature Granulocytes: 0.15 10*3/uL — ABNORMAL HIGH (ref 0.00–0.07)
Basophils Absolute: 0 10*3/uL (ref 0.0–0.1)
Basophils Relative: 0 %
Eosinophils Absolute: 0.2 10*3/uL (ref 0.0–0.5)
Eosinophils Relative: 1 %
HCT: 27.7 % — ABNORMAL LOW (ref 36.0–46.0)
Hemoglobin: 8.8 g/dL — ABNORMAL LOW (ref 12.0–15.0)
Immature Granulocytes: 1 %
Lymphocytes Relative: 17 %
Lymphs Abs: 1.8 10*3/uL (ref 0.7–4.0)
MCH: 28.7 pg (ref 26.0–34.0)
MCHC: 31.8 g/dL (ref 30.0–36.0)
MCV: 90.2 fL (ref 80.0–100.0)
Monocytes Absolute: 0.5 10*3/uL (ref 0.1–1.0)
Monocytes Relative: 5 %
Neutro Abs: 7.9 10*3/uL — ABNORMAL HIGH (ref 1.7–7.7)
Neutrophils Relative %: 76 %
Platelets: 326 10*3/uL (ref 150–400)
RBC: 3.07 MIL/uL — ABNORMAL LOW (ref 3.87–5.11)
RDW: 14.9 % (ref 11.5–15.5)
WBC: 10.5 10*3/uL (ref 4.0–10.5)
nRBC: 0 % (ref 0.0–0.2)

## 2022-03-22 LAB — POCT I-STAT 7, (LYTES, BLD GAS, ICA,H+H)
Acid-Base Excess: 3 mmol/L — ABNORMAL HIGH (ref 0.0–2.0)
Bicarbonate: 28.7 mmol/L — ABNORMAL HIGH (ref 20.0–28.0)
Calcium, Ion: 1.37 mmol/L (ref 1.15–1.40)
HCT: 26 % — ABNORMAL LOW (ref 36.0–46.0)
Hemoglobin: 8.8 g/dL — ABNORMAL LOW (ref 12.0–15.0)
O2 Saturation: 87 %
Patient temperature: 98.4
Potassium: 3.9 mmol/L (ref 3.5–5.1)
Sodium: 143 mmol/L (ref 135–145)
TCO2: 30 mmol/L (ref 22–32)
pCO2 arterial: 50.4 mmHg — ABNORMAL HIGH (ref 32–48)
pH, Arterial: 7.363 (ref 7.35–7.45)
pO2, Arterial: 56 mmHg — ABNORMAL LOW (ref 83–108)

## 2022-03-22 LAB — COMPREHENSIVE METABOLIC PANEL
ALT: 132 U/L — ABNORMAL HIGH (ref 0–44)
AST: 98 U/L — ABNORMAL HIGH (ref 15–41)
Albumin: 2.4 g/dL — ABNORMAL LOW (ref 3.5–5.0)
Alkaline Phosphatase: 68 U/L (ref 38–126)
Anion gap: 7 (ref 5–15)
BUN: 17 mg/dL (ref 8–23)
CO2: 27 mmol/L (ref 22–32)
Calcium: 9.4 mg/dL (ref 8.9–10.3)
Chloride: 107 mmol/L (ref 98–111)
Creatinine, Ser: 0.62 mg/dL (ref 0.44–1.00)
GFR, Estimated: >60 mL/min (ref >60)
Glucose, Bld: 149 mg/dL — ABNORMAL HIGH (ref 70–99)
Potassium: 3.8 mmol/L (ref 3.5–5.1)
Sodium: 141 mmol/L (ref 135–145)
Total Bilirubin: 0.5 mg/dL (ref 0.3–1.2)
Total Protein: 6.4 g/dL — ABNORMAL LOW (ref 6.5–8.1)

## 2022-03-22 LAB — GLUCOSE, CAPILLARY
Glucose-Capillary: 157 mg/dL — ABNORMAL HIGH (ref 70–99)
Glucose-Capillary: 178 mg/dL — ABNORMAL HIGH (ref 70–99)
Glucose-Capillary: 152 mg/dL — ABNORMAL HIGH (ref 70–99)
Glucose-Capillary: 113 mg/dL — ABNORMAL HIGH (ref 70–99)
Glucose-Capillary: 108 mg/dL — ABNORMAL HIGH (ref 70–99)

## 2022-03-22 MED ORDER — NOREPINEPHRINE 4 MG/250ML-% IV SOLN
INTRAVENOUS | Status: AC
  Filled 2022-03-22: qty 250

## 2022-03-22 MED ORDER — FUROSEMIDE 10 MG/ML IJ SOLN
40.0000 mg | Freq: Once | INTRAMUSCULAR | Status: AC
  Administered 2022-03-22: 40 mg via INTRAVENOUS
  Filled 2022-03-22: qty 4

## 2022-03-22 MED ORDER — DEXMEDETOMIDINE HCL IN NACL 400 MCG/100ML IV SOLN
0.2000 ug/kg/h | INTRAVENOUS | Status: DC
  Administered 2022-03-23: 0.6 ug/kg/h via INTRAVENOUS
  Administered 2022-03-25: 0.5 ug/kg/h via INTRAVENOUS
  Administered 2022-03-25: 0.2 ug/kg/h via INTRAVENOUS
  Filled 2022-03-22: qty 200
  Filled 2022-03-22 (×3): qty 100

## 2022-03-22 MED ORDER — DEXMEDETOMIDINE HCL IN NACL 400 MCG/100ML IV SOLN
0.0000 ug/kg/h | INTRAVENOUS | Status: DC
  Administered 2022-03-22: 0.4 ug/kg/h via INTRAVENOUS

## 2022-03-22 MED ORDER — CLONAZEPAM 0.25 MG PO TBDP
0.2500 mg | ORAL_TABLET | Freq: Two times a day (BID) | ORAL | Status: DC
Start: 2022-03-22 — End: 2022-03-23
  Administered 2022-03-22 (×2): 0.25 mg
  Filled 2022-03-22 (×2): qty 1

## 2022-03-22 MED ORDER — OXYCODONE HCL 5 MG PO TABS
5.0000 mg | ORAL_TABLET | ORAL | Status: DC | PRN
  Administered 2022-03-22 – 2022-03-29 (×3): 5 mg
  Filled 2022-03-22 (×3): qty 1

## 2022-03-22 MED ORDER — DEXMEDETOMIDINE HCL IN NACL 400 MCG/100ML IV SOLN
INTRAVENOUS | Status: AC
  Filled 2022-03-22: qty 100

## 2022-03-22 MED ORDER — NOREPINEPHRINE 16 MG/250ML-% IV SOLN
0.0000 ug/min | INTRAVENOUS | Status: DC
  Administered 2022-03-22: 20 ug/min via INTRAVENOUS
  Administered 2022-03-24: 4 ug/min via INTRAVENOUS
  Filled 2022-03-22 (×2): qty 250

## 2022-03-22 NOTE — Progress Notes (Signed)
OT Cancellation Note  Patient Details Name: Sheila Fernandez MRN: [id number] DOB: 1950-12-10   Cancelled Treatment:    Reason Eval/Treat Not Completed: Medical issues which prohibited therapy.  Patient with difficult evening, patient became unresponsive, placed on full support mechanical ventilator, and CT head was done which was negative for acute findings.  FiO2 increased and patient sedated.  RN may attempt OOB to chair this afternoon.  OT will check back as schedule allows.     [name] [name] 03/22/2022, 11:47 AM

## 2022-03-23 ENCOUNTER — Inpatient Hospital Stay (HOSPITAL_COMMUNITY): Payer: Medicare Other

## 2022-03-23 DIAGNOSIS — G904 Autonomic dysreflexia: Secondary | ICD-10-CM | POA: Diagnosis not present

## 2022-03-23 DIAGNOSIS — J151 Pneumonia due to Pseudomonas: Secondary | ICD-10-CM | POA: Diagnosis not present

## 2022-03-23 DIAGNOSIS — J9602 Acute respiratory failure with hypercapnia: Secondary | ICD-10-CM | POA: Diagnosis not present

## 2022-03-23 DIAGNOSIS — J9601 Acute respiratory failure with hypoxia: Secondary | ICD-10-CM | POA: Diagnosis not present

## 2022-03-23 LAB — COMPREHENSIVE METABOLIC PANEL
ALT: 161 U/L — ABNORMAL HIGH (ref 0–44)
AST: 113 U/L — ABNORMAL HIGH (ref 15–41)
Albumin: 2 g/dL — ABNORMAL LOW (ref 3.5–5.0)
Alkaline Phosphatase: 57 U/L (ref 38–126)
Anion gap: 10 (ref 5–15)
BUN: 27 mg/dL — ABNORMAL HIGH (ref 8–23)
CO2: 27 mmol/L (ref 22–32)
Calcium: 8.9 mg/dL (ref 8.9–10.3)
Chloride: 108 mmol/L (ref 98–111)
Creatinine, Ser: 0.78 mg/dL (ref 0.44–1.00)
GFR, Estimated: >60 mL/min (ref >60)
Glucose, Bld: 134 mg/dL — ABNORMAL HIGH (ref 70–99)
Potassium: 3.8 mmol/L (ref 3.5–5.1)
Sodium: 145 mmol/L (ref 135–145)
Total Bilirubin: 0.4 mg/dL (ref 0.3–1.2)
Total Protein: 5.4 g/dL — ABNORMAL LOW (ref 6.5–8.1)

## 2022-03-23 LAB — CBC WITH DIFFERENTIAL/PLATELET
Abs Immature Granulocytes: 0.07 10*3/uL (ref 0.00–0.07)
Basophils Absolute: 0 10*3/uL (ref 0.0–0.1)
Basophils Relative: 0 %
Eosinophils Absolute: 0.2 10*3/uL (ref 0.0–0.5)
Eosinophils Relative: 3 %
HCT: 23 % — ABNORMAL LOW (ref 36.0–46.0)
Hemoglobin: 7.2 g/dL — ABNORMAL LOW (ref 12.0–15.0)
Immature Granulocytes: 1 %
Lymphocytes Relative: 19 %
Lymphs Abs: 1.9 10*3/uL (ref 0.7–4.0)
MCH: 28.5 pg (ref 26.0–34.0)
MCHC: 31.3 g/dL (ref 30.0–36.0)
MCV: 90.9 fL (ref 80.0–100.0)
Monocytes Absolute: 0.5 10*3/uL (ref 0.1–1.0)
Monocytes Relative: 5 %
Neutro Abs: 6.9 10*3/uL (ref 1.7–7.7)
Neutrophils Relative %: 72 %
Platelets: 315 10*3/uL (ref 150–400)
RBC: 2.53 MIL/uL — ABNORMAL LOW (ref 3.87–5.11)
RDW: 15 % (ref 11.5–15.5)
WBC: 9.6 10*3/uL (ref 4.0–10.5)
nRBC: 0 % (ref 0.0–0.2)

## 2022-03-23 LAB — MAGNESIUM: Magnesium: 1.8 mg/dL (ref 1.7–2.4)

## 2022-03-23 LAB — GLUCOSE, CAPILLARY
Glucose-Capillary: 132 mg/dL — ABNORMAL HIGH (ref 70–99)
Glucose-Capillary: 144 mg/dL — ABNORMAL HIGH (ref 70–99)
Glucose-Capillary: 85 mg/dL (ref 70–99)
Glucose-Capillary: 139 mg/dL — ABNORMAL HIGH (ref 70–99)

## 2022-03-23 MED ORDER — PROPRANOLOL HCL 10 MG PO TABS
10.0000 mg | ORAL_TABLET | Freq: Three times a day (TID) | ORAL | Status: DC
  Administered 2022-03-23 – 2022-03-25 (×7): 10 mg
  Filled 2022-03-23 (×7): qty 1

## 2022-03-23 MED ORDER — BUSPIRONE HCL 10 MG PO TABS
15.0000 mg | ORAL_TABLET | Freq: Three times a day (TID) | ORAL | Status: DC
  Administered 2022-03-23 – 2022-03-28 (×17): 15 mg
  Filled 2022-03-23 (×17): qty 2

## 2022-03-23 NOTE — Evaluation (Signed)
Occupational Therapy Evaluation Patient Details Name: Sheila Fernandez MRN: [id number] DOB: July 30, 1951 Today's Date: 03/23/2022   History of Present Illness 71 y.o. female admitted 03/11/22 with chest pain, abdominal pain, SOB, FTT; admit to ICU with concern for possible aortic dissection. Pt with hypotension and AMS 6/14; ETT 6/14-6/15. CTA 6/14 with no evidence of aortic dissection. 6/15 code blue called due to unresponsiveness/bradycardia, concern for seizure; reintubated; LTM EEG without definitive seizures. 6/19 extubated and reintubated within the hour. 6/20 trach. PMHx:HTN, orthostatic hypotension, sickle cell trait, arthritis.   Clinical Impression   This 71 yo female admitted and has undergone above presents to acute OT with PLOF of being totally independent with all basic ADLs and IADLs until recently when she had to move in with her dtr due to decreased ability to care for herself. Currently she is limited by multiple lines and tubes (especially vent) thus needing increased A for all basic ADLs (Mod-total A). She will continue to benefit from acute OT with follow at [facility] or [facility] if she weans off of vent. Full vent support during eval.     Recommendations for follow up therapy are one component of a multi-disciplinary discharge planning process, led by the attending physician.  Recommendations may be updated based on patient status, additional functional criteria and insurance authorization.   Follow Up Recommendations  Long-term institutional care without follow-up therapy ([facility] If can wean off of vent)    Assistance Recommended at Discharge Frequent or constant Supervision/Assistance  Patient can return home with the following A lot of help with walking and/or transfers;A lot of help with bathing/dressing/bathroom;Assistance with feeding;Assistance with cooking/housework;Assist for transportation;Direct supervision/assist for financial management;Direct supervision/assist for  medications management    Functional Status Assessment  Patient has had a recent decline in their functional status and demonstrates the ability to make significant improvements in function in a reasonable and predictable amount of time.  Equipment Recommendations  Other (comment) (TBD next venue)       Precautions / Restrictions Precautions Precautions: Fall Precaution Comments: trach, cortrak, on and off vent Restrictions Weight Bearing Restrictions: No      Mobility Bed Mobility Overal bed mobility: Needs Assistance Bed Mobility: Supine to Sit, Sit to Supine     Supine to sit: Min assist, HOB elevated Sit to supine: Min assist   General bed mobility comments: min A for trunk to come up to sit, min A for legs to lay back down    Transfers                   General transfer comment: RN only ok'd sitting EOB      Balance Overall balance assessment: Needs assistance Sitting-balance support: No upper extremity supported, Feet unsupported Sitting balance-Leahy Scale: Good Sitting balance - Comments: can sit EOB and kick legs without LOB                                   ADL either performed or assessed with clinical judgement   ADL Overall ADL's : Needs assistance/impaired Eating/Feeding: NPO   Grooming: Moderate assistance;Sitting   Upper Body Bathing: Moderate assistance;Sitting   Lower Body Bathing: Total assistance;Bed level   Upper Body Dressing : Maximal assistance;Sitting   Lower Body Dressing: Total assistance;Bed level                       Vision Patient Visual Report: No  change from baseline              Pertinent Vitals/Pain Pain Assessment Pain Assessment: No/denies pain     Hand Dominance Right   Extremity/Trunk Assessment Upper Extremity Assessment Upper Extremity Assessment: Generalized weakness RUE Deficits / Details: reports h/o carpal tunnel with worsening bilateral hand weakness; decreased AROM  shoulders (new per pt) RUE Coordination: decreased gross motor LUE Deficits / Details: reports h/o carpal tunnel with worsening bilateral hand weakness;decreased AROM shoulders (new per pt) LUE Coordination: decreased gross motor           Communication Communication Communication: HOH   Cognition Arousal/Alertness: Awake/alert Behavior During Therapy: Flat affect Overall Cognitive Status: Within Functional Limits for tasks assessed                                 General Comments: did brighten up when dtr came into room                Home Living Family/patient expects to be discharged to:: Private residence Living Arrangements: Children Available Help at Discharge: Family;Available 24 hours/day Type of Home: House Home Access: Stairs to enter [employer] of Steps: 1   Home Layout: One level     Bathroom Shower/Tub: Chief Strategy Officer: Standard     Home Equipment: TEFL teacher Comments: Recently moved into daughter's home (~4 wks ago) due to increased difficulty with ADLs/iADLs due to worsening bilat hand weakness (pt refers to it as carpal tunnel)      Prior Functioning/Environment Prior Level of Function : Needs assist       Physical Assist : ADLs (physical)   ADLs (physical): Bathing;Dressing;IADLs Mobility Comments: Indep ambulation without DME ADLs Comments: Pt reports indep with toileting; daughter assists with bathing seated in shower; requires some assist from daughter for dressing. Daughter does majority of cooking/cleaning        OT Problem List: Decreased strength;Decreased range of motion;Impaired balance (sitting and/or standing);Impaired UE functional use      OT Treatment/Interventions: Self-care/ADL training;Therapeutic exercise;Therapeutic activities;DME and/or AE instruction;Patient/family education;Balance training    OT Goals(Current goals can be found in the care plan section)  Acute Rehab OT Goals Patient Stated Goal: agreeable to sit EOB OT Goal Formulation: With patient Time For Goal Achievement: 04/06/22 Potential to Achieve Goals: Good  OT Frequency: Min 2X/week       AM-PAC OT "6 Clicks" Daily Activity     Outcome Measure Help from another person eating meals?: Total Help from another person taking care of personal grooming?: A Lot Help from another person toileting, which includes using toliet, bedpan, or urinal?: Total Help from another person bathing (including washing, rinsing, drying)?: A Lot Help from another person to put on and taking off regular upper body clothing?: A Lot Help from another person to put on and taking off regular lower body clothing?: Total 6 Click Score: 9   End of Session Nurse Communication: Mobility status  Activity Tolerance: Patient tolerated treatment well Patient left: in bed;with call bell/phone within reach  OT Visit Diagnosis: Other abnormalities of gait and mobility (R26.89);Muscle weakness (generalized) (M62.81)                Time: 1610-9604 OT Time Calculation (min): 17 min Charges:  OT General Charges $OT Visit: 1 Visit OT Evaluation $OT Eval Moderate Complexity: 1 Mod  Ignacia Palma, OTR/L Acute Rehab Services [facility]ging  [place] [phone] Office [phone]    Evette Georges 03/23/2022, 5:23 PM

## 2022-03-23 NOTE — Procedures (Signed)
Bedside chest ultrasound:  Showing severe atelectasis and very small pleural effusion on right side Left side looks okay

## 2022-03-24 ENCOUNTER — Inpatient Hospital Stay (HOSPITAL_COMMUNITY): Payer: Medicare Other

## 2022-03-24 DIAGNOSIS — J9601 Acute respiratory failure with hypoxia: Secondary | ICD-10-CM | POA: Diagnosis not present

## 2022-03-24 DIAGNOSIS — J9602 Acute respiratory failure with hypercapnia: Secondary | ICD-10-CM | POA: Diagnosis not present

## 2022-03-24 DIAGNOSIS — G909 Disorder of the autonomic nervous system, unspecified: Secondary | ICD-10-CM

## 2022-03-24 DIAGNOSIS — J151 Pneumonia due to Pseudomonas: Secondary | ICD-10-CM

## 2022-03-24 LAB — COMPREHENSIVE METABOLIC PANEL
ALT: 169 U/L — ABNORMAL HIGH (ref 0–44)
AST: 93 U/L — ABNORMAL HIGH (ref 15–41)
Albumin: 2 g/dL — ABNORMAL LOW (ref 3.5–5.0)
Alkaline Phosphatase: 54 U/L (ref 38–126)
Anion gap: 6 (ref 5–15)
BUN: 34 mg/dL — ABNORMAL HIGH (ref 8–23)
CO2: 28 mmol/L (ref 22–32)
Calcium: 9 mg/dL (ref 8.9–10.3)
Chloride: 109 mmol/L (ref 98–111)
Creatinine, Ser: 0.8 mg/dL (ref 0.44–1.00)
GFR, Estimated: >60 mL/min (ref >60)
Glucose, Bld: 144 mg/dL — ABNORMAL HIGH (ref 70–99)
Potassium: 4.1 mmol/L (ref 3.5–5.1)
Sodium: 143 mmol/L (ref 135–145)
Total Bilirubin: 0.4 mg/dL (ref 0.3–1.2)
Total Protein: 4.8 g/dL — ABNORMAL LOW (ref 6.5–8.1)

## 2022-03-24 LAB — ALPHA GALACTOSIDASE: Alpha galactosidase, serum: 44.1 nmol/hr/mg prt (ref >35.5)

## 2022-03-24 LAB — RETICULOCYTES
Immature Retic Fract: 25.9 % — ABNORMAL HIGH (ref 2.3–15.9)
RBC.: 2.28 MIL/uL — ABNORMAL LOW (ref 3.87–5.11)
Retic Count, Absolute: 59.7 10*3/uL (ref 19.0–186.0)
Retic Ct Pct: 2.6 % (ref 0.4–3.1)

## 2022-03-24 LAB — CBC WITH DIFFERENTIAL/PLATELET
Abs Immature Granulocytes: 0.05 10*3/uL (ref 0.00–0.07)
Basophils Absolute: 0 10*3/uL (ref 0.0–0.1)
Basophils Relative: 0 %
Eosinophils Absolute: 0.2 10*3/uL (ref 0.0–0.5)
Eosinophils Relative: 2 %
HCT: 20.8 % — ABNORMAL LOW (ref 36.0–46.0)
Hemoglobin: 6.7 g/dL — CL (ref 12.0–15.0)
Immature Granulocytes: 1 %
Lymphocytes Relative: 20 %
Lymphs Abs: 1.5 10*3/uL (ref 0.7–4.0)
MCH: 29.3 pg (ref 26.0–34.0)
MCHC: 32.2 g/dL (ref 30.0–36.0)
MCV: 90.8 fL (ref 80.0–100.0)
Monocytes Absolute: 0.4 10*3/uL (ref 0.1–1.0)
Monocytes Relative: 5 %
Neutro Abs: 5.4 10*3/uL (ref 1.7–7.7)
Neutrophils Relative %: 72 %
Platelets: 295 10*3/uL (ref 150–400)
RBC: 2.29 MIL/uL — ABNORMAL LOW (ref 3.87–5.11)
RDW: 15.7 % — ABNORMAL HIGH (ref 11.5–15.5)
WBC: 7.6 10*3/uL (ref 4.0–10.5)
nRBC: 0 % (ref 0.0–0.2)

## 2022-03-24 LAB — HEMOGLOBIN AND HEMATOCRIT, BLOOD
HCT: 27.5 % — ABNORMAL LOW (ref 36.0–46.0)
Hemoglobin: 9.1 g/dL — ABNORMAL LOW (ref 12.0–15.0)

## 2022-03-24 LAB — IRON AND TIBC
Iron: 54 ug/dL (ref 28–170)
Saturation Ratios: 26 % (ref 10.4–31.8)
TIBC: 210 ug/dL — ABNORMAL LOW (ref 250–450)
UIBC: 156 ug/dL

## 2022-03-24 LAB — GLUCOSE, CAPILLARY
Glucose-Capillary: 125 mg/dL — ABNORMAL HIGH (ref 70–99)
Glucose-Capillary: 127 mg/dL — ABNORMAL HIGH (ref 70–99)
Glucose-Capillary: 131 mg/dL — ABNORMAL HIGH (ref 70–99)
Glucose-Capillary: 131 mg/dL — ABNORMAL HIGH (ref 70–99)
Glucose-Capillary: 136 mg/dL — ABNORMAL HIGH (ref 70–99)
Glucose-Capillary: 142 mg/dL — ABNORMAL HIGH (ref 70–99)
Glucose-Capillary: 150 mg/dL — ABNORMAL HIGH (ref 70–99)
Glucose-Capillary: 153 mg/dL — ABNORMAL HIGH (ref 70–99)

## 2022-03-24 LAB — PREPARE RBC (CROSSMATCH)

## 2022-03-24 LAB — FOLATE: Folate: 17.9 ng/mL (ref >5.9)

## 2022-03-24 LAB — MAGNESIUM: Magnesium: 2 mg/dL (ref 1.7–2.4)

## 2022-03-24 LAB — FERRITIN: Ferritin: 327 ng/mL — ABNORMAL HIGH (ref 11–307)

## 2022-03-24 LAB — VITAMIN B12: Vitamin B-12: 912 pg/mL (ref 180–914)

## 2022-03-24 MED ORDER — ORAL CARE MOUTH RINSE: 15.0000 mL | OROMUCOSAL | Status: DC | PRN

## 2022-03-24 MED ORDER — GUAIFENESIN 100 MG/5ML PO LIQD
10.0000 mL | Freq: Four times a day (QID) | ORAL | Status: DC
Start: 2022-03-24 — End: 2022-04-05
  Administered 2022-03-24 – 2022-04-05 (×49): 10 mL
  Filled 2022-03-24 (×50): qty 10

## 2022-03-24 MED ORDER — FREE WATER
100.0000 mL | Status: DC
  Administered 2022-03-24 – 2022-03-29 (×30): 100 mL

## 2022-03-24 MED ORDER — ORAL CARE MOUTH RINSE
15.0000 mL | OROMUCOSAL | Status: DC
  Administered 2022-03-24 – 2022-03-27 (×32): 15 mL via OROMUCOSAL

## 2022-03-24 MED ORDER — MIDAZOLAM HCL 2 MG/2ML IJ SOLN
4.0000 mg | Freq: Once | INTRAMUSCULAR | Status: AC
  Administered 2022-03-24: 4 mg via INTRAVENOUS
  Filled 2022-03-24: qty 4

## 2022-03-24 MED ORDER — LIP MEDEX EX OINT
TOPICAL_OINTMENT | CUTANEOUS | Status: DC | PRN
  Filled 2022-03-24: qty 7

## 2022-03-24 MED ORDER — DEXAMETHASONE SODIUM PHOSPHATE 4 MG/ML IJ SOLN
4.0000 mg | INTRAMUSCULAR | Status: AC
  Administered 2022-03-24 – 2022-03-26 (×3): 4 mg via INTRAVENOUS
  Filled 2022-03-24 (×3): qty 1

## 2022-03-24 MED ORDER — SODIUM CHLORIDE 0.9% IV SOLUTION
Freq: Once | INTRAVENOUS | Status: AC
Start: 2022-03-24 — End: 2022-03-24

## 2022-03-24 MED ORDER — FENTANYL CITRATE PF 50 MCG/ML IJ SOSY
200.0000 ug | PREFILLED_SYRINGE | Freq: Once | INTRAMUSCULAR | Status: AC
  Administered 2022-03-24: 200 ug via INTRAVENOUS
  Filled 2022-03-24: qty 4

## 2022-03-24 NOTE — Progress Notes (Signed)
[name], MRN:  [id number], DOB:  Dec 09, 1950, LOS: 13 ADMISSION DATE:  03/11/2022, CONSULTATION DATE:  03/11/22 REFERRING MD:  Silverio Lay, CHIEF COMPLAINT:  Chest pain, abdominal pain  History of Present Illness:  71 yo woman with chest pain, abdominal pain, SOB. Sudden onset epigastric pain radiating to back since yesterday.  BP elevated always, missed her bp meds this am. CT chest several months ago, mass vs atelectasis.  Here with mid abdominal pain since yesterday.  Low back pain.  SOB, fatigue x 3 months Fatigue and weight loss x 6 months.  Recent abdominal pain, started on protonix    Admitted to ICU, CT abdomen/pelvis negative for AAS, initially on esmolol infusion then required Levophed for hypotension and intubation for encephalopathy. Spot EEG and head CT were negative.  She was extubated 6/14 and transferred out of the ICU.    Overnight on 6/15, pt was ambulating and conversing with family when she suddenly became restless and then altered and had an episode of bradycardia to the 40's.  A code blue was called, but pt never lost pulses, she did require bagging for a short time.  Her HR improved without medications and she was responding to some commands, but had upwardly deviated gaze and flexion of the R arm.  She was transferred to the ICU and re-intubated and loaded with Keppra   Pertinent  Medical History  HTN, Orthostatic hypotension, Anemia, Arthritis, Iritis, Recurrent Sickle cell trait  Echocardiogram 12/10/2021: Left ventricle cavity is normal in size and wall thickness. Normal global wall motion. Normal LV systolic function with EF 61%. Doppler evidence of grade I (impaired) diastolic dysfunction, normal LAP. Mild mitral valve stenosis. Mild tricuspid regurgitation. IVC not seen. No significant change compared to previous study in 2019.  Lexiscan Tetrofosmin stress test 02/01/2022:Normal myocardial perfusion. Stress LVEF 57%.Low risk study.  Significant  Hospital Events: Including procedures, antibiotic start and stop dates in addition to other pertinent events   6/13 admitted to ICU for esmolol gtt with question of possible dissection 6/14 hypotensive, AMS on esmolol 300. Intubated. Starting NE. CT H neg. CTA no AAS. BAL: cytology neg.  Extubate 6/15 6/15-16 night Episode of AMS and bradycardia, concern for sz, re-intubated and loaded with Keppra 6/16 still no sz on EEG. MRI c spine and brain: and without contrast. Mild foraminal narrowing bilaterally at C2-3.Severe left and moderate right foraminal stenosis at C3-4.Moderate foraminal narrowing at C4-5 is worse left than right.  Moderate right and mild left foraminal narrowing at C5-6.Mild left foraminal narrowing at C6-7. No sig change compared w/ MRI 4/11; No acute intracranial abnormality or significant interval change. 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 This likely reflects the sequela of chronic microvascular ischemia. 6/17 failed SBT attempt. PCXR w/ right sided PNA. Cultures sent. Unasyn started.  6/18 continued to spike fever despite addition of Unasyn, switched to Zosyn 6/19 extubated, had to be reintubated for hypercapnia 6/20: Trached 6/26: Bronchoscopy for pulmonary toilette.    Interim History / Subjective:  Continues to have dyspnea. Complains of poor sleep due to coughing. Unable to wean.   Objective   Blood pressure 115/62, pulse (!) 101, temperature 98.7 F (37.1 C), temperature source Oral, resp. rate (!) 23, height 5\' 1"  (1.549 m), weight 62.7 kg, SpO2 96 %.    Vent Mode: PRVC FiO2 (%):  [40 %] 40 % Set Rate:  [18 bmp] 18 bmp Vt Set:  [380 mL] 380 mL PEEP:  [  10 cmH20] 10 cmH20 Plateau Pressure:  [20 cmH20-24 cmH20] 23 cmH20   Intake/Output Summary (Last 24 hours) at 03/24/2022 0852 Last data filed at 03/24/2022 0700 Gross per 24 hour  Intake 2005.33 ml  Output 1300 ml  Net 705.33 ml    Filed Weights    03/22/22 0500 03/23/22 0500 03/24/22 0500  Weight: 60.5 kg 62.5 kg 62.7 kg     Physical exam: General: Acute on chronically ill-appearing female, s/p trach HEENT: [place]/AT, eyes anicteric.  Cortrak in place Neuro: Awake communicating via board.  Chest: Markedly decreased air entry to right side.  Heart: Regular rate and rhythm, no murmurs or gallops Abdomen: Soft, nontender, nondistended, bowel sounds present Skin: No rash  Ancillary tests personally reviewed:   CXR shows right lower lung collapse.   Assessment & Plan:   Acute respiratory failure with hypoxia (HCC) Initially intubated for airway protection from periods of unresponsiveness. Now remains mechanically ventilated via tracheostomy with limited ability to wean.  CXR shows right lower lobe collapse Breath sounds are markedly decreased on the right side.   - Bronchoscopy today to relieve focal area of suspected mucus plugging.  - Will follow up with chest physiotherapy and mucolytics  Pneumonia of right lung due to Pseudomonas species (HCC) BAL on 6/22 showed rare Pseudomonas.  - Currently on day 8 of Zosyn - Pseudomonal pneumonia may result in significant sputum production, thus may see frequent mucus plugging.   Protein-calorie malnutrition, severe Generalized muscle waisting.  - Continue tube feeds.   Autonomic dysfunction Alternating hyper/hypotension. Has evened out some with addition of propranolol.  Underlying cause still unclear.   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  Sickle cell trait (HCC) Associated anemia.   - Transfuse today for HB of 6.7.   Best Practice (right click and "Reselect all SmartList Selections" daily)   Diet/type: tubefeeds DVT prophylaxis: SCD GI prophylaxis: PPI Lines: Central line Foley:  N/A Code Status:  full code Last date of  multidisciplinary goals of care discussion [ 2 daughters updated at bedside 6/25) ]  CRITICAL CARE Performed by: Lynnell Catalan   Total critical care time: 40 minutes  Critical care time was exclusive of separately billable procedures and treating other patients.  Critical care was necessary to treat or prevent imminent or life-threatening deterioration.  Critical care was time spent personally by me on the following activities: development of treatment plan with patient and/or surrogate as well as nursing, discussions with consultants, evaluation of patient's response to treatment, examination of patient, obtaining history from patient or surrogate, ordering and performing treatments and interventions, ordering and review of laboratory studies, ordering and review of radiographic studies, pulse oximetry, re-evaluation of patient's condition and participation in multidisciplinary rounds.  Lynnell Catalan, MD [facility] ICU Physician [facility]  Pager: [phone] Mobile: [phone] After hours: [phone].

## 2022-03-24 NOTE — Assessment & Plan Note (Signed)
Generalized muscle waisting.  - Continue tube feeds.

## 2022-03-24 NOTE — Assessment & Plan Note (Deleted)
Alternating hyper/hypotension. Has evened out some with addition of propranolol.  Underlying cause still unclear.   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 - More likely that patient simply has bad arteriosclerosis and is unable to autoregulate BP.

## 2022-03-24 NOTE — Assessment & Plan Note (Addendum)
BAL on 6/22 showed rare Pseudomonas. BAL 6/26 now growing Pseudomonas  - Persistent low grade pseudomonas infection.  - Trial of inhaled tobramycin

## 2022-03-24 NOTE — Assessment & Plan Note (Addendum)
Associated anemia.  HB 8.9 this morning

## 2022-03-24 NOTE — Assessment & Plan Note (Addendum)
Initially intubated for airway protection from periods of unresponsiveness. Now remains mechanically ventilated via tracheostomy with limited ability to wean.  CXR 6/29 improving aeration on right base  Improved aeration on right side on exam and by CXR  - SBT daily. If tolerates 2h - try 2h of trach collar then resume full support.  - Continue with chest physiotherapy and mucolytics - Still very slow to wean, would be appropriate for LTAC at this time.

## 2022-03-25 ENCOUNTER — Inpatient Hospital Stay (HOSPITAL_COMMUNITY): Payer: Medicare Other

## 2022-03-25 DIAGNOSIS — J9601 Acute respiratory failure with hypoxia: Secondary | ICD-10-CM | POA: Diagnosis not present

## 2022-03-25 DIAGNOSIS — J9602 Acute respiratory failure with hypercapnia: Secondary | ICD-10-CM | POA: Diagnosis not present

## 2022-03-25 LAB — GLUCOSE, CAPILLARY
Glucose-Capillary: 144 mg/dL — ABNORMAL HIGH (ref 70–99)
Glucose-Capillary: 119 mg/dL — ABNORMAL HIGH (ref 70–99)
Glucose-Capillary: 157 mg/dL — ABNORMAL HIGH (ref 70–99)
Glucose-Capillary: 150 mg/dL — ABNORMAL HIGH (ref 70–99)
Glucose-Capillary: 118 mg/dL — ABNORMAL HIGH (ref 70–99)
Glucose-Capillary: 111 mg/dL — ABNORMAL HIGH (ref 70–99)

## 2022-03-25 LAB — BASIC METABOLIC PANEL
Anion gap: 7 (ref 5–15)
BUN: 34 mg/dL — ABNORMAL HIGH (ref 8–23)
CO2: 27 mmol/L (ref 22–32)
Calcium: 9.4 mg/dL (ref 8.9–10.3)
Chloride: 113 mmol/L — ABNORMAL HIGH (ref 98–111)
Creatinine, Ser: 0.68 mg/dL (ref 0.44–1.00)
GFR, Estimated: >60 mL/min (ref >60)
Glucose, Bld: 168 mg/dL — ABNORMAL HIGH (ref 70–99)
Potassium: 4 mmol/L (ref 3.5–5.1)
Sodium: 147 mmol/L — ABNORMAL HIGH (ref 135–145)

## 2022-03-25 LAB — CBC
HCT: 25.6 % — ABNORMAL LOW (ref 36.0–46.0)
Hemoglobin: 8.1 g/dL — ABNORMAL LOW (ref 12.0–15.0)
MCH: 28.5 pg (ref 26.0–34.0)
MCHC: 31.6 g/dL (ref 30.0–36.0)
MCV: 90.1 fL (ref 80.0–100.0)
Platelets: 314 10*3/uL (ref 150–400)
RBC: 2.84 MIL/uL — ABNORMAL LOW (ref 3.87–5.11)
RDW: 17.2 % — ABNORMAL HIGH (ref 11.5–15.5)
WBC: 10.2 10*3/uL (ref 4.0–10.5)
nRBC: 0 % (ref 0.0–0.2)

## 2022-03-25 LAB — BPAM RBC
Blood Product Expiration Date: 202306262359
ISSUE DATE / TIME: 202306260851
Unit Type and Rh: 9500

## 2022-03-25 LAB — TYPE AND SCREEN
ABO/RH(D): O NEG
Antibody Screen: NEGATIVE
Unit division: 0

## 2022-03-25 LAB — MAGNESIUM: Magnesium: 1.9 mg/dL (ref 1.7–2.4)

## 2022-03-25 MED ORDER — FUROSEMIDE 10 MG/ML IJ SOLN
40.0000 mg | Freq: Once | INTRAMUSCULAR | Status: AC
  Administered 2022-03-25: 40 mg via INTRAVENOUS
  Filled 2022-03-25: qty 4

## 2022-03-25 MED ORDER — ACETYLCYSTEINE 20 % IN SOLN: 3.0000 mL | Freq: Three times a day (TID) | RESPIRATORY_TRACT | Status: DC

## 2022-03-25 MED ORDER — QUETIAPINE FUMARATE 50 MG PO TABS
50.0000 mg | ORAL_TABLET | Freq: Every day | ORAL | Status: DC
  Administered 2022-03-25: 50 mg via ORAL
  Filled 2022-03-25 (×2): qty 1

## 2022-03-25 MED ORDER — ALBUTEROL SULFATE (2.5 MG/3ML) 0.083% IN NEBU
2.5000 mg | INHALATION_SOLUTION | Freq: Three times a day (TID) | RESPIRATORY_TRACT | Status: DC
  Administered 2022-03-25 – 2022-03-28 (×8): 2.5 mg via RESPIRATORY_TRACT
  Filled 2022-03-25 (×8): qty 3

## 2022-03-25 MED ORDER — ACETYLCYSTEINE 20 % IN SOLN
3.0000 mL | Freq: Three times a day (TID) | RESPIRATORY_TRACT | Status: DC
  Administered 2022-03-25: 3 mL via RESPIRATORY_TRACT
  Filled 2022-03-25 (×2): qty 4

## 2022-03-25 MED ORDER — POTASSIUM CHLORIDE 20 MEQ PO PACK
20.0000 meq | PACK | Freq: Once | ORAL | Status: AC
  Administered 2022-03-25: 20 meq
  Filled 2022-03-25: qty 1

## 2022-03-25 MED ORDER — PROPRANOLOL HCL 40 MG PO TABS
40.0000 mg | ORAL_TABLET | Freq: Three times a day (TID) | ORAL | Status: DC
  Administered 2022-03-25 – 2022-03-27 (×8): 40 mg
  Filled 2022-03-25 (×9): qty 1

## 2022-03-25 MED ORDER — MAGNESIUM SULFATE 2 GM/50ML IV SOLN
2.0000 g | Freq: Once | INTRAVENOUS | Status: AC
Start: 2022-03-25 — End: 2022-03-25
  Administered 2022-03-25: 2 g via INTRAVENOUS
  Filled 2022-03-25: qty 50

## 2022-03-25 MED ORDER — HYDRALAZINE HCL 10 MG PO TABS
10.0000 mg | ORAL_TABLET | Freq: Three times a day (TID) | ORAL | Status: DC
  Administered 2022-03-25 – 2022-03-27 (×8): 10 mg
  Filled 2022-03-25 (×9): qty 1

## 2022-03-25 MED ORDER — ATROPINE SULFATE 1 MG/10ML IJ SOSY
PREFILLED_SYRINGE | INTRAMUSCULAR | Status: AC
  Filled 2022-03-25: qty 10

## 2022-03-25 NOTE — Progress Notes (Signed)
Pt did not want chest PT at this time, Will try again later.

## 2022-03-25 NOTE — Progress Notes (Signed)
Pt refuses chest PT at this time and wants to try again later.

## 2022-03-26 DIAGNOSIS — J9601 Acute respiratory failure with hypoxia: Secondary | ICD-10-CM | POA: Diagnosis not present

## 2022-03-26 DIAGNOSIS — J9602 Acute respiratory failure with hypercapnia: Secondary | ICD-10-CM | POA: Diagnosis not present

## 2022-03-26 LAB — GLUCOSE, CAPILLARY
Glucose-Capillary: 116 mg/dL — ABNORMAL HIGH (ref 70–99)
Glucose-Capillary: 122 mg/dL — ABNORMAL HIGH (ref 70–99)
Glucose-Capillary: 126 mg/dL — ABNORMAL HIGH (ref 70–99)
Glucose-Capillary: 127 mg/dL — ABNORMAL HIGH (ref 70–99)
Glucose-Capillary: 170 mg/dL — ABNORMAL HIGH (ref 70–99)

## 2022-03-26 LAB — MAGNESIUM: Magnesium: 2.3 mg/dL (ref 1.7–2.4)

## 2022-03-26 LAB — CBC
HCT: 29.2 % — ABNORMAL LOW (ref 36.0–46.0)
Hemoglobin: 8.9 g/dL — ABNORMAL LOW (ref 12.0–15.0)
MCH: 27.9 pg (ref 26.0–34.0)
MCHC: 30.5 g/dL (ref 30.0–36.0)
MCV: 91.5 fL (ref 80.0–100.0)
Platelets: 289 10*3/uL (ref 150–400)
RBC: 3.19 MIL/uL — ABNORMAL LOW (ref 3.87–5.11)
RDW: 17.2 % — ABNORMAL HIGH (ref 11.5–15.5)
WBC: 8.3 10*3/uL (ref 4.0–10.5)
nRBC: 0 % (ref 0.0–0.2)

## 2022-03-26 LAB — CULTURE, BAL-QUANTITATIVE W GRAM STAIN: Culture: 40000 — AB

## 2022-03-26 LAB — BASIC METABOLIC PANEL
Anion gap: 5 (ref 5–15)
BUN: 34 mg/dL — ABNORMAL HIGH (ref 8–23)
CO2: 30 mmol/L (ref 22–32)
Calcium: 9.3 mg/dL (ref 8.9–10.3)
Chloride: 112 mmol/L — ABNORMAL HIGH (ref 98–111)
Creatinine, Ser: 0.68 mg/dL (ref 0.44–1.00)
GFR, Estimated: >60 mL/min (ref >60)
Glucose, Bld: 131 mg/dL — ABNORMAL HIGH (ref 70–99)
Potassium: 3.8 mmol/L (ref 3.5–5.1)
Sodium: 147 mmol/L — ABNORMAL HIGH (ref 135–145)

## 2022-03-26 MED ORDER — FUROSEMIDE 10 MG/ML IJ SOLN
40.0000 mg | Freq: Every day | INTRAMUSCULAR | Status: AC
  Administered 2022-03-26 – 2022-03-28 (×3): 40 mg via INTRAVENOUS
  Filled 2022-03-26 (×3): qty 4

## 2022-03-26 MED ORDER — POTASSIUM CHLORIDE 20 MEQ PO PACK
40.0000 meq | PACK | Freq: Once | ORAL | Status: AC
  Administered 2022-03-26: 40 meq
  Filled 2022-03-26: qty 2

## 2022-03-26 MED ORDER — ACETYLCYSTEINE 20 % IN SOLN
3.0000 mL | Freq: Three times a day (TID) | RESPIRATORY_TRACT | Status: DC
  Administered 2022-03-26 – 2022-03-28 (×7): 3 mL via RESPIRATORY_TRACT
  Filled 2022-03-26 (×8): qty 4

## 2022-03-26 MED ORDER — QUETIAPINE FUMARATE 50 MG PO TABS
50.0000 mg | ORAL_TABLET | Freq: Every day | ORAL | Status: DC
  Administered 2022-03-26 – 2022-04-01 (×7): 50 mg
  Filled 2022-03-26 (×6): qty 1

## 2022-03-26 MED ORDER — DOCUSATE SODIUM 50 MG/5ML PO LIQD
100.0000 mg | Freq: Two times a day (BID) | ORAL | Status: DC | PRN
Start: 2022-03-26 — End: 2022-03-29
  Administered 2022-03-29: 100 mg via ORAL
  Filled 2022-03-26: qty 10

## 2022-03-26 MED ORDER — POLYETHYLENE GLYCOL 3350 17 G PO PACK
17.0000 g | PACK | Freq: Every day | ORAL | Status: DC | PRN
Start: 2022-03-26 — End: 2022-04-08
  Administered 2022-03-31: 17 g
  Filled 2022-03-26 (×2): qty 1

## 2022-03-26 MED ORDER — MORPHINE SULFATE 15 MG PO TABS
15.0000 mg | ORAL_TABLET | Freq: Four times a day (QID) | ORAL | Status: DC
  Administered 2022-03-26 – 2022-03-29 (×11): 15 mg
  Filled 2022-03-26 (×13): qty 1

## 2022-03-26 NOTE — TOC Progression Note (Signed)
Transition of Care [facility]) - Progression Note    Patient Details  Name: LILLYIAN HEIDT MRN: [id number] Date of Birth: [birth year]  Transition of Care [facility]) CM/SW [place], RN Phone Number: 03/26/2022, 4:35 PM  Clinical Narrative:    1400-Spoke to Noni, daughter, regarding long term acute hospital. Noni is requesting to speak to MD. Notified Dr. Lynetta Mare. MD notified this RNCM that he will reach out to daughter.  38 Noni stated she hasn't spoken to MD yet and will call this RNCM after their .conversation.      Expected Discharge Plan and Services                                                 Social Determinants of Health (SDOH) Interventions    Readmission Risk Interventions     No data to display

## 2022-03-26 NOTE — Progress Notes (Signed)
NAMEJONI Fernandez, MRN:  [id number], DOB:  [birth year], LOS: 84 ADMISSION DATE:  03/11/2022, CONSULTATION DATE:  03/11/22 REFERRING MD:  Darl Householder, CHIEF COMPLAINT:  Chest pain, abdominal pain  History of Present Illness:  71 yo woman with chest pain, abdominal pain, SOB. Sudden onset epigastric pain radiating to back since yesterday.  BP elevated always, missed her bp meds this am. CT chest several months ago, mass vs atelectasis.  Here with mid abdominal pain since yesterday.  Low back pain.  SOB, fatigue x 3 months Fatigue and weight loss x 6 months.  Recent abdominal pain, started on protonix    Admitted to ICU, CT abdomen/pelvis negative for AAS, initially on esmolol infusion then required Levophed for hypotension and intubation for encephalopathy. Spot EEG and head CT were negative.  She was extubated 6/14 and transferred out of the ICU.    Overnight on 6/15, pt was ambulating and conversing with family when she suddenly became restless and then altered and had an episode of bradycardia to the 40's.  A code blue was called, but pt never lost pulses, she did require bagging for a short time.  Her HR improved without medications and she was responding to some commands, but had upwardly deviated gaze and flexion of the R arm.  She was transferred to the ICU and re-intubated and loaded with Keppra   Pertinent  Medical History  HTN, Orthostatic hypotension, Anemia, Arthritis, Iritis, Recurrent Sickle cell trait  Echocardiogram 12/10/2021: Left ventricle cavity is normal in size and wall thickness. Normal global wall motion. Normal LV systolic function with EF 61%. Doppler evidence of grade I (impaired) diastolic dysfunction, normal LAP. Mild mitral valve stenosis. Mild tricuspid regurgitation. IVC not seen. No significant change compared to previous study in 2019.  Lexiscan Tetrofosmin stress test 02/01/2022:Normal myocardial perfusion. Stress LVEF 57%.Low risk study.  Significant  Hospital Events: Including procedures, antibiotic start and stop dates in addition to other pertinent events   6/13 admitted to ICU for esmolol gtt with question of possible dissection 6/14 hypotensive, AMS on esmolol 300. Intubated. Starting NE. CT H neg. CTA no AAS. BAL: cytology neg.  Extubate 6/15 6/15-16 night Episode of AMS and bradycardia, concern for sz, re-intubated and loaded with Keppra 6/16 still no sz on EEG. MRI c spine and brain: and without contrast. Mild foraminal narrowing bilaterally at C2-3.Severe left and moderate right foraminal stenosis at C3-4.Moderate foraminal narrowing at C4-5 is worse left than right.  Moderate right and mild left foraminal narrowing at C5-6.Mild left foraminal narrowing at C6-7. No sig change compared w/ MRI 4/11; No acute intracranial abnormality or significant interval change. 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 This likely reflects the sequela of chronic microvascular ischemia. 6/17 failed SBT attempt. PCXR w/ right sided PNA. Cultures sent. Unasyn started.  6/18 continued to spike fever despite addition of Unasyn, switched to [place] 6/19 extubated, had to be reintubated for hypercapnia 6/20: Trached 6/26: Bronchoscopy for pulmonary toilette.    Interim History / Subjective:  Continues to have episodes of dyspnea.  Still copious thick secretions.   Objective   Blood pressure (!) 224/129, pulse 95, temperature 98.5 F (36.9 C), temperature source Oral, resp. rate (!) 23, height _0  (1.549 m), weight 59.8 kg, SpO2 97 %.    Vent Mode: PRVC FiO2 (%):  [40 %] 40 % Set Rate:  [18 bmp] 18 bmp Vt Set:  [380 mL] 380 mL PEEP:  [5 cmH20]  5 cmH20 Pressure Support:  [10 cmH20] 10 cmH20 Plateau Pressure:  [18 cmH20-26 cmH20] 20 cmH20   Intake/Output Summary (Last 24 hours) at 03/26/2022 1258 Last data filed at 03/26/2022 1100 Gross per 24 hour  Intake 688.2 ml  Output 1750 ml  Net -1061.8  ml    Filed Weights   03/24/22 0500 03/25/22 0500 03/26/22 0500  Weight: 62.7 kg 58.8 kg 59.8 kg     Physical exam: General: Acute on chronically ill-appearing female, s/p trach HEENT: [place]/AT, eyes anicteric.  Cortrak in place Neuro: Awake communicating via board.  Chest: Markedly decreased air entry to right side.  Heart: Regular rate and rhythm, no murmurs or gallops Abdomen: Soft, nontender, nondistended, bowel sounds present Skin: No rash  Ancillary tests personally reviewed:   CXR shows persistent decreased lung volume right side.   Assessment & Plan:   Acute respiratory failure with hypoxia (HCC) Initially intubated for airway protection from periods of unresponsiveness. Now remains mechanically ventilated via tracheostomy with limited ability to wean.  CXR shows right lower lobe collapse broncho 6/26  Improved aeration on right side on exam and by CXR  - SBT today. If tolerates 2h - try 2h of trach collar then resume full support.  - Continue with chest physiotherapy and mucolytics - Diurese today.  - Still very slow to wean, would be appropriate for LTAC at this time.   Pneumonia of right lung due to Pseudomonas species ([place]) BAL on 6/22 showed rare Pseudomonas. No organisms on BAL 6/26.   - Complete course of Zosyn for pseudomonas   Protein-calorie malnutrition, severe Generalized muscle waisting.  - Continue tube feeds.   Autonomic dysfunction Alternating hyper/hypotension. Has evened out some with addition of propranolol.  Underlying cause still unclear.   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 - More likely that patient simply has bad arteriosclerosis and is unable to autoregulate BP.    Sickle cell trait (HCC) Associated anemia.  HB 8.9 this morning   Best Practice (right click and "Reselect all SmartList Selections"  daily)   Diet/type: tubefeeds DVT prophylaxis: SCD and heparin GI prophylaxis: PPI Lines: Central line remove arterial line Foley:  N/A [place] Code Status:  full code Last date of multidisciplinary goals of care discussion [ 2 daughters updated at bedside 6/27) ]  CRITICAL CARE Performed by: Kipp Brood   Total critical care time: 40 minutes  Critical care time was exclusive of separately billable procedures and treating other patients.  Critical care was necessary to treat or prevent imminent or life-threatening deterioration.  Critical care was time spent personally by me on the following activities: development of treatment plan with patient and/or surrogate as well as nursing, discussions with consultants, evaluation of patient's response to treatment, examination of patient, obtaining history from patient or surrogate, ordering and performing treatments and interventions, ordering and review of laboratory studies, ordering and review of radiographic studies, pulse oximetry, re-evaluation of patient's condition and participation in multidisciplinary rounds.  Kipp Brood, MD [facility] ICU Physician [place]  Pager: [phone] Mobile: [phone] After hours: [phone].

## 2022-03-26 NOTE — Progress Notes (Signed)
Pt refused CPT at this time, pt wants to rest.

## 2022-03-26 NOTE — Progress Notes (Signed)
PT Cancellation Note  Patient Details Name: Sheila Fernandez MRN: [id number] DOB: 06/03/1951   Cancelled Treatment:    Reason Eval/Treat Not Completed: Medical issues which prohibited therapy this morning as pt with continued respiratory difficulties and RN asked therapies to hold at this time. Will continue to follow and re-attempt at later time as time/schedule allows.   [place], PT, DPT   Acute Rehabilitation Department   Sandra Cockayne 03/26/2022, 9:49 AM

## 2022-03-27 ENCOUNTER — Inpatient Hospital Stay (HOSPITAL_COMMUNITY): Payer: Medicare Other

## 2022-03-27 DIAGNOSIS — J9601 Acute respiratory failure with hypoxia: Secondary | ICD-10-CM | POA: Diagnosis not present

## 2022-03-27 DIAGNOSIS — J9602 Acute respiratory failure with hypercapnia: Secondary | ICD-10-CM | POA: Diagnosis not present

## 2022-03-27 LAB — CBC
HCT: 28.1 % — ABNORMAL LOW (ref 36.0–46.0)
Hemoglobin: 8.4 g/dL — ABNORMAL LOW (ref 12.0–15.0)
MCH: 28.5 pg (ref 26.0–34.0)
MCHC: 29.9 g/dL — ABNORMAL LOW (ref 30.0–36.0)
MCV: 95.3 fL (ref 80.0–100.0)
Platelets: 370 10*3/uL (ref 150–400)
RBC: 2.95 MIL/uL — ABNORMAL LOW (ref 3.87–5.11)
RDW: 16.8 % — ABNORMAL HIGH (ref 11.5–15.5)
WBC: 15 10*3/uL — ABNORMAL HIGH (ref 4.0–10.5)
nRBC: 0 % (ref 0.0–0.2)

## 2022-03-27 LAB — GLUCOSE, CAPILLARY
Glucose-Capillary: 107 mg/dL — ABNORMAL HIGH (ref 70–99)
Glucose-Capillary: 110 mg/dL — ABNORMAL HIGH (ref 70–99)
Glucose-Capillary: 111 mg/dL — ABNORMAL HIGH (ref 70–99)
Glucose-Capillary: 121 mg/dL — ABNORMAL HIGH (ref 70–99)
Glucose-Capillary: 132 mg/dL — ABNORMAL HIGH (ref 70–99)
Glucose-Capillary: 149 mg/dL — ABNORMAL HIGH (ref 70–99)

## 2022-03-27 LAB — BASIC METABOLIC PANEL
Anion gap: 4 — ABNORMAL LOW (ref 5–15)
BUN: 40 mg/dL — ABNORMAL HIGH (ref 8–23)
CO2: 31 mmol/L (ref 22–32)
Calcium: 9.4 mg/dL (ref 8.9–10.3)
Chloride: 113 mmol/L — ABNORMAL HIGH (ref 98–111)
Creatinine, Ser: 0.73 mg/dL (ref 0.44–1.00)
GFR, Estimated: >60 mL/min (ref >60)
Glucose, Bld: 158 mg/dL — ABNORMAL HIGH (ref 70–99)
Potassium: 4.4 mmol/L (ref 3.5–5.1)
Sodium: 148 mmol/L — ABNORMAL HIGH (ref 135–145)

## 2022-03-27 MED ORDER — TOBRAMYCIN 300 MG/5ML IN NEBU
300.0000 mg | INHALATION_SOLUTION | Freq: Two times a day (BID) | RESPIRATORY_TRACT | Status: DC
Start: 2022-03-27 — End: 2022-03-28
  Administered 2022-03-27 – 2022-03-28 (×3): 300 mg via RESPIRATORY_TRACT
  Filled 2022-03-27 (×4): qty 5

## 2022-03-27 NOTE — Progress Notes (Signed)
SLP Cancellation Note  Patient Details Name: Sheila Fernandez MRN: [id number] DOB: [birth year]   Cancelled treatment:       Reason Eval/Treat Not Completed: Patient not medically ready. Pt working on SBT and ATC goal today. Will follow for readiness.    Reighlynn Swiney, Katherene Ponto 03/27/2022, 9:04 AM

## 2022-03-27 NOTE — Progress Notes (Signed)
NAMESELENE Fernandez, MRN:  [id number], DOB:  Apr 24, 1951, LOS: 58 ADMISSION DATE:  03/11/2022, CONSULTATION DATE:  03/11/22 REFERRING MD:  Darl Householder, CHIEF COMPLAINT:  Chest pain, abdominal pain  History of Present Illness:  71 yo woman with chest pain, abdominal pain, SOB. Sudden onset epigastric pain radiating to back since yesterday.  BP elevated always, missed her bp meds this am. CT chest several months ago, mass vs atelectasis.  Here with mid abdominal pain since yesterday.  Low back pain.  SOB, fatigue x 3 months Fatigue and weight loss x 6 months.  Recent abdominal pain, started on protonix    Admitted to ICU, CT abdomen/pelvis negative for AAS, initially on esmolol infusion then required Levophed for hypotension and intubation for encephalopathy. Spot EEG and head CT were negative.  She was extubated 6/14 and transferred out of the ICU.    Overnight on 6/15, pt was ambulating and conversing with family when she suddenly became restless and then altered and had an episode of bradycardia to the 40's.  A code blue was called, but pt never lost pulses, she did require bagging for a short time.  Her HR improved without medications and she was responding to some commands, but had upwardly deviated gaze and flexion of the R arm.  She was transferred to the ICU and re-intubated and loaded with Keppra   Pertinent  Medical History  HTN, Orthostatic hypotension, Anemia, Arthritis, Iritis, Recurrent Sickle cell trait  Echocardiogram 12/10/2021: Left ventricle cavity is normal in size and wall thickness. Normal global wall motion. Normal LV systolic function with EF 61%. Doppler evidence of grade I (impaired) diastolic dysfunction, normal LAP. Mild mitral valve stenosis. Mild tricuspid regurgitation. IVC not seen. No significant change compared to previous study in 2019.  Lexiscan Tetrofosmin stress test 02/01/2022:Normal myocardial perfusion. Stress LVEF 57%.Low risk study.  Significant  Hospital Events: Including procedures, antibiotic start and stop dates in addition to other pertinent events   6/13 admitted to ICU for esmolol gtt with question of possible dissection 6/14 hypotensive, AMS on esmolol 300. Intubated. Starting NE. CT H neg. CTA no AAS. BAL: cytology neg.  Extubate 6/15 6/15-16 night Episode of AMS and bradycardia, concern for sz, re-intubated and loaded with Keppra 6/16 still no sz on EEG. MRI c spine and brain: and without contrast. Mild foraminal narrowing bilaterally at C2-3.Severe left and moderate right foraminal stenosis at C3-4.Moderate foraminal narrowing at C4-5 is worse left than right.  Moderate right and mild left foraminal narrowing at C5-6.Mild left foraminal narrowing at C6-7. No sig change compared w/ MRI 4/11; No acute intracranial abnormality or significant interval change. 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 This likely reflects the sequela of chronic microvascular ischemia. 6/17 failed SBT attempt. PCXR w/ right sided PNA. Cultures sent. Unasyn started.  6/18 continued to spike fever despite addition of Unasyn, switched to [place] 6/19 extubated, had to be reintubated for hypercapnia 6/20: Trached 6/26: Bronchoscopy for pulmonary toilette.    Interim History / Subjective:  Coughing and secretions have improved.   Objective   Blood pressure (!) 125/95, pulse 76, temperature 99 F (37.2 C), temperature source Oral, resp. rate (!) 23, height _0  (1.549 m), weight 58.5 kg, SpO2 98 %.    Vent Mode: PRVC FiO2 (%):  [40 %] 40 % Set Rate:  [18 bmp] 18 bmp Vt Set:  [380 mL] 380 mL PEEP:  [5 cmH20] 5 cmH20 Plateau Pressure:  [16  LHT34-28 cmH20] 16 cmH20   Intake/Output Summary (Last 24 hours) at 03/27/2022 1045 Last data filed at 03/27/2022 1000 Gross per 24 hour  Intake 2624.17 ml  Output 1775 ml  Net 849.17 ml    Filed Weights   03/25/22 0500 03/26/22 0500 03/27/22 0500   Weight: 58.8 kg 59.8 kg 58.5 kg     Physical exam: General: Acute on chronically ill-appearing female, s/p trach HEENT: [place]/AT, eyes anicteric.  Cortrak in place Neuro: Awake communicating via board.  Chest: Markedly decreased air entry to right side.  Heart: Regular rate and rhythm, no murmurs or gallops Abdomen: Soft, nontender, nondistended, bowel sounds present Skin: No rash  Ancillary tests personally reviewed:   CXR shows persistent decreased lung volume right side.   Assessment & Plan:   Acute respiratory failure with hypoxia (HCC) Initially intubated for airway protection from periods of unresponsiveness. Now remains mechanically ventilated via tracheostomy with limited ability to wean.  CXR 6/29 improving aeration on right base  Improved aeration on right side on exam and by CXR  - SBT today. If tolerates 2h - try 2h of trach collar then resume full support.  - Continue with chest physiotherapy and mucolytics - Diurese today again.  - Still very slow to wean, would be appropriate for LTAC at this time.   Pneumonia of right lung due to Pseudomonas species ([place]) BAL on 6/22 showed rare Pseudomonas. BAL 6/26 now growing Pseudomonas  - Persistent low grade pseudomonas infection.  - Trial of inhaled tobramycin    Protein-calorie malnutrition, severe Generalized muscle waisting.  - Continue tube feeds.   Possible autonomic dysfunction Alternating hyper/hypotension. Has evened out some with addition of propranolol.  Underlying cause still unclear.   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 - More likely that patient simply has bad arteriosclerosis and is unable to autoregulate BP.    Sickle cell trait (HCC) Associated anemia.  HB 8.9 this morning   Best Practice (right click and "Reselect all SmartList Selections" daily)    Diet/type: tubefeeds DVT prophylaxis: SCD and heparin GI prophylaxis: PPI Lines: Central line remove arterial line Foley:  N/A [place] Code Status:  full code Last date of multidisciplinary goals of care discussion [ 2 daughters updated at bedside 6/27) ]  CRITICAL CARE Performed by: Kipp Brood   Total critical care time: 40 minutes  Critical care time was exclusive of separately billable procedures and treating other patients.  Critical care was necessary to treat or prevent imminent or life-threatening deterioration.  Critical care was time spent personally by me on the following activities: development of treatment plan with patient and/or surrogate as well as nursing, discussions with consultants, evaluation of patient's response to treatment, examination of patient, obtaining history from patient or surrogate, ordering and performing treatments and interventions, ordering and review of laboratory studies, ordering and review of radiographic studies, pulse oximetry, re-evaluation of patient's condition and participation in multidisciplinary rounds.  Kipp Brood, MD [facility] ICU Physician [place]  Pager: [phone] Mobile: [phone] After hours: [phone].

## 2022-03-27 NOTE — Progress Notes (Signed)
Occupational Therapy Treatment Patient Details Name: Sheila Fernandez MRN: [id number] DOB: September 06, 1951 Today's Date: 03/27/2022   History of present illness 71 y.o. female admitted 03/11/22 with chest pain, abdominal pain, SOB, FTT; admit to ICU with concern for possible aortic dissection. Pt with hypotension and AMS 6/14; ETT 6/14-6/15. CTA 6/14 with no evidence of aortic dissection. 6/15 code blue called due to unresponsiveness/bradycardia, concern for seizure; reintubated; LTM EEG without definitive seizures. 6/19 extubated and reintubated within the hour. 6/20 trach. PMHx:HTN, orthostatic hypotension, sickle cell trait, arthritis.   OT comments  Patient with fair progress toward all patient focused goals.  Patient seen in conjunction with PT to progress stand tolerance and taking steps.  Patient on PS/CPAP setting for trach during session.  Patient performance for basic mobility varied from Min A to Mod A with second person for safety and line management.  OT to continue efforts in the acute setting, with post acute recommendation dependent on the patient's ability to wean from the vent support.     Recommendations for follow up therapy are one component of a multi-disciplinary discharge planning process, led by the attending physician.  Recommendations may be updated based on patient status, additional functional criteria and insurance authorization.    Follow Up Recommendations  OT at Long-term acute care hospital vs AIR if she can wean from the vent.     Assistance Recommended at Discharge Frequent or constant Supervision/Assistance  Patient can return home with the following  A lot of help with walking and/or transfers;A lot of help with bathing/dressing/bathroom;Assistance with feeding;Assistance with cooking/housework;Assist for transportation;Direct supervision/assist for financial management;Direct supervision/assist for medications management   Equipment Recommendations        Recommendations for Other Services      Precautions / Restrictions Precautions Precautions: Fall Precaution Comments: trach, cortrak, vent weaning Restrictions Weight Bearing Restrictions: No       Mobility Bed Mobility               General bed mobility comments: up in the recliner Patient Response: Cooperative  Transfers Overall transfer level: Needs assistance Equipment used: 2 person hand held assist Transfers: Sit to/from Stand Sit to Stand: Min assist, Mod assist, +2 safety/equipment           General transfer comment: Peep 5, FiO2 40% and PS/CPAP during session.     Balance Overall balance assessment: Needs assistance Sitting-balance support: Feet supported Sitting balance-Leahy Scale: Fair     Standing balance support: Bilateral upper extremity supported Standing balance-Leahy Scale: Poor                             A  Extremity/Trunk Assessment Upper Extremity Assessment Upper Extremity Assessment: Generalized weakness RUE Deficits / Details: reports h/o carpal tunnel with worsening bilateral hand weakness; decreased AROM shoulders, grossly 3+/5 RUE Sensation: WNL LUE Deficits / Details: reports h/o carpal tunnel with worsening bilateral hand weakness;decreased AROM shoulders, grossly 3+/5 LUE Sensation: WNL   Lower Extremity Assessment Lower Extremity Assessment: Defer to PT evaluation   Cervical / Trunk Assessment Cervical / Trunk Assessment: Normal    Vision Patient Visual Report: No change from baseline     Perception Perception Perception: Within Functional Limits   Praxis Praxis Praxis: Intact    Cognition Arousal/Alertness: Awake/alert Behavior During Therapy: WFL for tasks assessed/performed Overall Cognitive Status: Within Functional Limits for tasks assessed  Pertinent Vitals/ Pain       Pain Assessment Pain Assessment:  Faces Faces Pain Scale: No hurt Pain Intervention(s): Monitored during session                                                          Frequency  Min 2X/week        Progress Toward Goals  OT Goals(current goals can now be found in the care plan section)  Progress towards OT goals: Progressing toward goals  Acute Rehab OT Goals OT Goal Formulation: With patient Time For Goal Achievement: 04/06/22 Potential to Achieve Goals: Good  Plan Discharge plan remains appropriate    Co-evaluation    PT/OT/SLP Co-Evaluation/Treatment: Yes Reason for Co-Treatment: Complexity of the patient's impairments (multi-system involvement);For patient/therapist safety;To address functional/ADL transfers   OT goals addressed during session: ADL's and self-care      AM-PAC OT "6 Clicks" Daily Activity     Outcome Measure   Help from another person eating meals?: Total Help from another person taking care of personal grooming?: A Little Help from another person toileting, which includes using toliet, bedpan, or urinal?: A Lot Help from another person bathing (including washing, rinsing, drying)?: A Lot Help from another person to put on and taking off regular upper body clothing?: A Lot Help from another person to put on and taking off regular lower body clothing?: A Lot 6 Click Score: 12    End of Session Equipment Utilized During Treatment: Rolling walker (2 wheels);Oxygen  OT Visit Diagnosis: Other abnormalities of gait and mobility (R26.89);Muscle weakness (generalized) (M62.81)   Activity Tolerance Patient tolerated treatment well   Patient Left in chair;with call bell/phone within reach   Nurse Communication Mobility status        Time: 4193-7902 OT Time Calculation (min): 24 min  Charges: OT General Charges $OT Visit: 1 Visit OT Treatments $Therapeutic Activity: 8-22 mins  03/27/2022  RP, OTR/L  Acute Rehabilitation Services  Office:   [phone]   Metta Clines 03/27/2022, 12:30 PM

## 2022-03-27 NOTE — TOC Initial Note (Signed)
Transition of Care [facility]) - Initial/Assessment Note    Patient Details  Name: Sheila Fernandez MRN: [id number] Date of Birth: Aug 24, 1951  Transition [facility] CM/SW Contact:    Bethena Roys, RN Phone Number: 03/27/2022, 11:12 AM  Clinical Narrative:  Case Manager received a consult for LTAC. Previous Case Manager spoke with daughter Noni and she has questions for the MD regarding LTAC. Family offered choice and feels that [facility] is the best options since it is within the same facility. Case Manager did reach out to [facility] and the patient is a candidate to pursue LTAC. Daughter Noni states that daughter Tonette Bihari will be visiting the hospital today at noon and wants to speak with the provider. Secure chat submitted to MD and Staff RN to make them aware. Post meeting family will make Case Manager aware of transition of care plan. If they choose LTAC insurance will need to authorize LTAC. Case Manager will continue to follow for additional transition of care needs.            Expected Discharge Plan: Long Term Acute Care (LTAC) Barriers to Discharge: Continued Medical Work up   Patient Goals and CMS Choice Patient states their goals for this hospitalization and ongoing recovery are:: family is leaning towards LTAC   Choice offered to / list presented to : Adult Children  Expected Discharge Plan and Services Expected Discharge Plan: Long Term Acute Care (LTAC) In-house Referral: NA Discharge Planning Services: CM Consult Post Acute Care Choice: Long Term Acute Care (LTAC) Living arrangements for the past 2 months: Apartment                   DME Agency: NA     Prior Living Arrangements/Services Living arrangements for the past 2 months: Apartment Lives with:: Self Patient language and need for interpreter reviewed:: Yes Do you feel safe going back to the place where you live?: Yes      Need for Family Participation in Patient Care: Yes  (Comment) Care giver support system in place?: Yes (comment)   Criminal Activity/Legal Involvement Pertinent to Current Situation/Hospitalization: No - Comment as needed  Activities of Daily Living Home Assistive Devices/Equipment: None ADL Screening (condition at time of admission) Patient's cognitive ability adequate to safely complete daily activities?: Yes Is the patient deaf or have difficulty hearing?: Yes Does the patient have difficulty seeing, even when wearing glasses/contacts?: No Does the patient have difficulty concentrating, remembering, or making decisions?: Yes Patient able to express need for assistance with ADLs?: Yes Does the patient have difficulty dressing or bathing?: Yes Independently performs ADLs?: No Communication: Independent Dressing (OT): Needs assistance Is this a change from baseline?: Pre-admission baseline Grooming: Needs assistance Is this a change from baseline?: Pre-admission baseline Feeding: Needs assistance Is this a change from baseline?: Pre-admission baseline Bathing: Needs assistance Is this a change from baseline?: Pre-admission baseline Toileting: Independent In/Out Bed: Independent Walks in Home: Independent Does the patient have difficulty walking or climbing stairs?: No Weakness of Legs: None Weakness of Arms/Hands: Both  Permission Sought/Granted Permission sought to share information with : Family Supports, Customer service manager, Case Optician, dispensing granted to share information with : Yes, Verbal Permission Granted (family)     Permission granted to share info w AGENCY: [facility]        Emotional Assessment         Alcohol / Substance Use: Not Applicable Psych Involvement: No (comment)  Admission diagnosis:  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] Dissection of aorta ([place]) [I71.00] Chest pain [R07.9] Patient Active Problem List   Diagnosis Date Noted   Pneumonia of right lung due to Pseudomonas  species ([place]) 03/24/2022   Autonomic dysfunction 03/24/2022   Acute respiratory failure with hypoxia (HCC)    Protein-calorie malnutrition, severe 03/12/2022   Orthostatic hypotension 01/21/2019   Dyspnea on exertion 01/21/2019   Left shoulder pain 03/02/2015   Biceps tendinopathy 08/15/2014   Nausea alone 09/06/2013   History of smoking 09/06/2013   Dizziness - light-headed 04/29/2012   Arthritis    Iritis, recurrent 01/16/2011   PLANTAR FASCIITIS, BILATERAL 08/15/2010   Chronic cough 08/15/2010   FIBROIDS, UTERUS 12/21/2009   ASCUS PAP 10/05/2009   CERVICAL RADICULOPATHY 07/21/2007   Sickle cell trait ([place]) 11/26/2006   Supine hypertension 11/26/2006   PCP:  Margretta Sidle, MD Pharmacy:   [facility] [place], [place] - [place] [address] [place] [address] Phone: [phone] Fax: [phone]   Readmission Risk Interventions     No data to display

## 2022-03-27 NOTE — Progress Notes (Signed)
Physical Therapy Treatment Patient Details Name: Sheila Fernandez MRN: [id number] DOB: 07-29-1951 Today's Date: 03/27/2022   History of Present Illness Pt is a 71 y.o. female admitted 03/11/22 with chest pain, abdominal pain, SOB, FTT; admit to ICU with concern for possible aortic dissection. Pt with hypotension and AMS 6/14; ETT 6/14-6/15. CTA 6/14 with no evidence of aortic dissection. 6/15 code blue called due to unresponsiveness/bradycardia, concern for seizure; reintubated; LTM EEG without definitive seizures. 6/19 extubated and reintubated within the hour. S/p trach 6/20. BAL on 6/22 showed rare pseudomonas. S/p bronchoscopy 6/26 for pulmonary toilet. PMH includes:HTN, orthostatic hypotension, sickle cell trait, arthritis.   PT Comments    Pt progressing with mobility. Today's session focused on transfer and gait training with RW; pt requiring min-modA for walking 2x bouts of 6-8'; pt quick to fatigue with increased WOB requiring prolonged seated rest breaks to recover. Pt remains limited by generalized weakness, decreased activity tolerance, and impaired balance strategies/postural reactions. Will continue to follow acutely to address established goals.    Recommendations for follow up therapy are one component of a multi-disciplinary discharge planning process, led by the attending physician.  Recommendations may be updated based on patient status, additional functional criteria and insurance authorization.  Follow Up Recommendations  PT at Long-term acute care hospital (AIR if able to wean off vent)     Assistance Recommended at Discharge Frequent or constant Supervision/Assistance  Patient can return home with the following A lot of help with walking and/or transfers;Assistance with cooking/housework;Assist for transportation;A lot of help with bathing/dressing/bathroom   Equipment Recommendations  Rolling walker (2 wheels);BSC/3in1;Wheelchair (measurements PT);Wheelchair cushion  (measurements PT)    Recommendations for Other Services       Precautions / Restrictions Precautions Precautions: Fall;Other (comment) Precaution Comments: trach, cortrak, vent weaning (PC/CPAP, 40% FiO2, PEEP 5) Restrictions Weight Bearing Restrictions: No     Mobility  Bed Mobility               General bed mobility comments: received sitting in recliner    Transfers Overall transfer level: Needs assistance Equipment used: Rolling walker (2 wheels) Transfers: Sit to/from Stand Sit to Stand: Min assist, +2 physical assistance, Mod assist           General transfer comment: initial minA+2 for trunk elevation standing from recliner to RW; 2x additional trial swith modA, increasing fatigue; repeated cues for hand placement as pt attempting to pull on RW    Ambulation/Gait Ambulation/Gait assistance: Min assist, Mod assist, +2 safety/equipment Gait Distance (Feet): 8 Feet (+ 6') Assistive device: Rolling walker (2 wheels) Gait Pattern/deviations: Step-to pattern, Shuffle, Trunk flexed, Narrow base of support, Leaning posteriorly Gait velocity: Decreased     General Gait Details: slow, unsteady, fatigued gait with RW; initial minA for stability and RW management walking ~8' forwards/backwards (within confines of vent line length); prolonged seated rest then additional ~6' forwards/backwards with increasing posterior lean requiring modA to maintain upright   Stairs             Wheelchair Mobility    Modified Rankin (Stroke Patients Only)       Balance Overall balance assessment: Needs assistance Sitting-balance support: Feet supported Sitting balance-Leahy Scale: Fair     Standing balance support: During functional activity, Reliant on assistive device for balance Standing balance-Leahy Scale: Poor Standing balance comment: reliant on UE support and/or external assist  Cognition Arousal/Alertness:  Awake/alert Behavior During Therapy: WFL for tasks assessed/performed Overall Cognitive Status: Within Functional Limits for tasks assessed                                 General Comments: able to clearly mouth words and make needs known; fatigued        Exercises General Exercises - Lower Extremity Long Arc Quad: AROM, Both, Seated    General Comments General comments (skin integrity, edema, etc.): SpO2 stable on vent (PC/CPAP) via trach at 40% FiO2, PEEP 5; HR 64      Pertinent Vitals/Pain Pain Assessment Pain Assessment: No/denies pain Pain Intervention(s): Monitored during session    Home Living                          Prior Function            PT Goals (current goals can now be found in the care plan section) Progress towards PT goals: Progressing toward goals    Frequency    Min 3X/week      PT Plan Current plan remains appropriate    Co-evaluation PT/OT/SLP Co-Evaluation/Treatment: Yes Reason for Co-Treatment: Complexity of the patient's impairments (multi-system involvement);For patient/therapist safety;To address functional/ADL transfers PT goals addressed during session: Mobility/safety with mobility;Balance;Proper use of DME OT goals addressed during session: ADL's and self-care      AM-PAC PT "6 Clicks" Mobility   Outcome Measure  Help needed turning from your back to your side while in a flat bed without using bedrails?: A Little Help needed moving from lying on your back to sitting on the side of a flat bed without using bedrails?: A Little Help needed moving to and from a bed to a chair (including a wheelchair)?: A Lot Help needed standing up from a chair using your arms (e.g., wheelchair or bedside chair)?: A Lot Help needed to walk in hospital room?: Total Help needed climbing 3-5 steps with a railing? : Total 6 Click Score: 12    End of Session Equipment Utilized During Treatment: Gait belt;Oxygen (vent via  trach) Activity Tolerance: Patient tolerated treatment well Patient left: in chair;with call bell/phone within reach Nurse Communication: Mobility status PT Visit Diagnosis: Other abnormalities of gait and mobility (R26.89);Difficulty in walking, not elsewhere classified (R26.2);Muscle weakness (generalized) (M62.81)     Time: 8588-5027 PT Time Calculation (min) (ACUTE ONLY): 28 min  Charges:  $Therapeutic Activity: 8-22 mins                     Mabeline Caras, PT, DPT Acute Rehabilitation Services  Personal: [place] [facility] Office: [place] 03/27/2022, 1:39 PM

## 2022-03-27 NOTE — Progress Notes (Signed)
eLink Physician-Brief Progress Note Patient Name: Sheila Fernandez DOB: 1951-04-09 MRN: [id number]   Date of Service  03/27/2022  HPI/Events of Note  Patient with oliguria and a bladder scan positive for > 500 ml of urinary retention.  eICU Interventions  In / Out bladder catheterization ordered PRN x 3.        Kerry Kass Reyli Schroth 03/27/2022, 5:33 AM

## 2022-03-28 DIAGNOSIS — J9601 Acute respiratory failure with hypoxia: Secondary | ICD-10-CM | POA: Diagnosis not present

## 2022-03-28 LAB — GLUCOSE, CAPILLARY
Glucose-Capillary: 110 mg/dL — ABNORMAL HIGH (ref 70–99)
Glucose-Capillary: 131 mg/dL — ABNORMAL HIGH (ref 70–99)
Glucose-Capillary: 139 mg/dL — ABNORMAL HIGH (ref 70–99)
Glucose-Capillary: 139 mg/dL — ABNORMAL HIGH (ref 70–99)
Glucose-Capillary: 147 mg/dL — ABNORMAL HIGH (ref 70–99)

## 2022-03-28 MED ORDER — ALBUTEROL SULFATE (2.5 MG/3ML) 0.083% IN NEBU
2.5000 mg | INHALATION_SOLUTION | RESPIRATORY_TRACT | Status: DC | PRN
  Administered 2022-03-29 – 2022-04-02 (×3): 2.5 mg via RESPIRATORY_TRACT
  Filled 2022-03-28 (×3): qty 3

## 2022-03-28 MED ORDER — ORAL CARE MOUTH RINSE: 15.0000 mL | OROMUCOSAL | Status: DC | PRN

## 2022-03-28 MED ORDER — ATROPINE SULFATE 1 MG/ML IV SOLN: 1.0000 mg | Freq: Every day | INTRAVENOUS | Status: DC | PRN

## 2022-03-28 MED ORDER — LORAZEPAM 2 MG/ML IJ SOLN: 0.5000 mg | INTRAMUSCULAR | Status: DC | PRN

## 2022-03-28 MED ORDER — ATROPINE SULFATE 1 MG/ML IV SOLN: 0.5000 mg | Freq: Every day | INTRAVENOUS | Status: DC | PRN

## 2022-03-28 MED ORDER — FUROSEMIDE 10 MG/ML IJ SOLN
40.0000 mg | Freq: Once | INTRAMUSCULAR | Status: AC
  Administered 2022-03-29: 40 mg via INTRAVENOUS
  Filled 2022-03-28: qty 4

## 2022-03-28 MED ORDER — ORAL CARE MOUTH RINSE
15.0000 mL | OROMUCOSAL | Status: DC
  Administered 2022-03-28 – 2022-03-29 (×8): 15 mL via OROMUCOSAL

## 2022-03-28 NOTE — Progress Notes (Signed)
Patient seen today by trach team for consult.  No education is needed at this time.  All necessary equipment is at beside.   Will continue to follow for progression.  

## 2022-03-28 NOTE — Progress Notes (Addendum)
Sheila Fernandez, MRN:  [id number], DOB:  July 20, 1951, LOS: 93 ADMISSION DATE:  03/11/2022, CONSULTATION DATE:  03/11/22 REFERRING MD:  Sheila Fernandez, CHIEF COMPLAINT:  Chest pain, abdominal pain  History of Present Illness:  71 yo woman with chest pain, abdominal pain, SOB. Sudden onset epigastric pain radiating to back since yesterday.  BP elevated always, missed her bp meds this am. CT chest several months ago, mass vs atelectasis.  Here with mid abdominal pain since yesterday.  Low back pain.  SOB, fatigue x 3 months Fatigue and weight loss x 6 months.  Recent abdominal pain, started on protonix   Admitted to ICU, CT abdomen/pelvis negative for AAS, initially on esmolol infusion then required Levophed for hypotension and intubation for encephalopathy. Spot EEG and head CT were negative.  She was extubated 6/14 and transferred out of the ICU.   Overnight on 6/15, pt was ambulating and conversing with family when she suddenly became restless and then altered and had an episode of bradycardia to the 40's.  A code blue was called, but pt never lost pulses, she did require bagging for a short time.  Her HR improved without medications and she was responding to some commands, but had upwardly deviated gaze and flexion of the R arm.  She was transferred to the ICU and re-intubated and loaded with Keppra   Pertinent  Medical History  HTN, Orthostatic hypotension, Anemia, Arthritis, Iritis, Recurrent Sickle cell trait  Echocardiogram 12/10/2021: Left ventricle cavity is normal in size and wall thickness. Normal global wall motion. Normal LV systolic function with EF 61%. Doppler evidence of grade I (impaired) diastolic dysfunction, normal LAP. Mild mitral valve stenosis. Mild tricuspid regurgitation. IVC not seen. No significant change compared to previous study in 2019.  Lexiscan Tetrofosmin stress test 02/01/2022:Normal myocardial perfusion. Stress LVEF 57%.Low risk study.  Significant  Hospital Events: Including procedures, antibiotic start and stop dates in addition to other pertinent events   6/13 admitted to ICU for esmolol gtt with question of possible dissection 6/14 hypotensive, AMS on esmolol 300. Intubated. Starting NE. CT H neg. CTA no AAS. BAL: cytology neg.  Extubate 6/15 6/15-16 night Episode of AMS and bradycardia, concern for sz, re-intubated and loaded with Keppra 6/16 still no sz on EEG. MRI c spine and brain: and without contrast. Mild foraminal narrowing bilaterally at C2-3.Severe left and moderate right foraminal stenosis at C3-4.Moderate foraminal narrowing at C4-5 is worse left than right.  Moderate right and mild left foraminal narrowing at C5-6.Mild left foraminal narrowing at C6-7. No sig change compared w/ MRI 4/11; No acute intracranial abnormality or significant interval change. 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 This likely reflects the sequela of chronic microvascular ischemia. 6/17 failed SBT attempt. PCXR w/ right sided PNA. Cultures sent. Unasyn started.  6/18 continued to spike fever despite addition of Unasyn, switched to  6/19 extubated, had to be reintubated for hypercapnia 6/20: Trached 6/26: Bronchoscopy for pulmonary toilette.    Interim History / Subjective:   Sheila Fernandez denies any concerns this morning. Discussed plans to work with PT and find inpatient rehab placement, which pt is agreeable to. She had a trial of SBT yesterday which tired her out and she was returned to the vent. Continues with tube feeds.   Had 2-3 episodes of bradycarida down to 40s and 50s on 6/29.   Objective   Blood pressure (!) 90/56, pulse 72, temperature 99.1 F (37.3 C), temperature source  Oral, resp. rate 19, height _0  (1.549 m), weight 58 kg, SpO2 98 %.    Vent Mode: PSV;CPAP FiO2 (%):  [40 %] 40 % Set Rate:  [18 bmp] 18 bmp Vt Set:  [380 mL] 380 mL PEEP:  [5 cmH20] 5  cmH20 Pressure Support:  [10 cmH20] 10 cmH20 Plateau Pressure:  [16 cmH20-18 cmH20] 18 cmH20   Intake/Output Summary (Last 24 hours) at 03/28/2022 1023 Last data filed at 03/28/2022 0800 Gross per 24 hour  Intake 1406.17 ml  Output 2050 ml  Net -643.83 ml    Filed Weights   03/26/22 0500 03/27/22 0500 03/28/22 0500  Weight: 59.8 kg 58.5 kg 58 kg     Physical exam: General: Acute on chronically ill-appearing female, s/p trach HEENT: [place]/AT, eyes anicteric.  Cortrak in place Neuro: Awake communicating via board.  Chest: Decreased breath sounds over right base  Heart: Regular rate and rhythm, no murmurs or gallops Abdomen: Soft, nontender, nondistended, bowel sounds present Skin: No rash  Ancillary tests personally reviewed:   CXR shows persistent decreased lung volume right side.   Assessment & Plan:   Acute respiratory failure with hypoxia (HCC) Was initially intubated for airway protection after episodes of AMS. Remains mechanically ventilated with tracheostomy and has had slow wean and failed SBT attempts. 6/29 CXR with improvement in R lung base, but still with pleural effusion.  - Continue chest physiotherapy and mucolytics - Furosemide 12m today - Suitable for inpatient rehab and placement impending  Pneumonia of right lung due to Pseudomonas species ([place] BAL on 6/22 showed rare Pseudomonas. BAL 6/26 growing Pseudomonas. Has had persistent low grade pseudomonas likely colonization. Has remained afebrile and with clinical appearance of improvement so will hold off further antibiotics unless clinically worsening.  - Continue to monitor  Possible autonomic dysfunction Alternating hyper/hypotension and bradycardic episodes. Was evaluated by Cards in March who recommended not treating supine hypertension, given BP normalization with standing (after initial orthostatic resolution). Given bradycardic and hypotensive episodes, may be worth stopping beta blocker and hydralazine.  Last 6/13 EKG with normal PR interval and without bundle branch blocks. Differential of atherosclerosis and diminished ability to autoregulate BP vs possible neurodegenerative process more consistent with generalized weakness, however was seen by Neurology in March with rec for EMG which was completed and showed severe bilateral limiting cervical radiculopathy complicated by BUE weakness. - Consider Neuro consult - Atropine available to keep HR>50  Protein-calorie malnutrition, severe Generalized muscle waisting over the course of last several months. May be related to underlying neurologic process? - Continue tube feeds.   Sickle cell trait ([place] Has had associated anemia. Hgb has been stable.  - Continue to monitor  Best Practice (right click and "Reselect all SmartList Selections" daily)   Diet/type: tubefeeds DVT prophylaxis: SCD and heparin GI prophylaxis: PPI Lines: Central line remove arterial line Foley:  N/A Purewick Code Status:  full code Last date of multidisciplinary goals of care discussion [ 2 daughters updated at bedside 6/27) ]   Signed,  Sheila Fernandez MS4    PCCM:  Sheila Fernandez, ?autonomic dysfunction, complicated hospital course. Unclear of why she keep having these episodes.   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 This was completed through the application of advanced monitoring technologies and extensive interpretation of multiple databases. During this encounter critical care time was devoted to patient care services described in this note for 32 minutes.   [facility] [place] [facility] 03/28/2022 5:08 PM

## 2022-03-28 NOTE — TOC Progression Note (Addendum)
Transition of Care [facility] - Progression Note    Patient Details  Name: [name] MRN: [id number] Date of Birth: 1951/05/02  Transition of Care [facility]) CM/SW Contact  Graves-Bigelow, Ocie Cornfield, RN Phone Number: 03/28/2022, 11:13 AM  Clinical Narrative:  Case Manager received a message from the family that they would like to tour [facility]. Case Manager called Lorenza Burton with [facility] to make her aware to see if this can be arranged. Once family has made a decision to proceed with LTAC; [facility] can submit for insurance authorization. Case Manager will continue to follow for additional transition of care needs.  1241 03-28-22 Case Manager received a message from [place] and patients daughter can meet for the tour on Monday morning. Hopefully clinicals to be sent to insurance on Monday for authorization.    Expected Discharge Plan: Long Term Acute Care (LTAC) Barriers to Discharge: Continued Medical Work up  Expected Discharge Plan and Services Expected Discharge Plan: Long Term Acute Care (LTAC) In-house Referral: NA Discharge Planning Services: CM Consult Post Acute Care Choice: Long Term Acute Care (LTAC) Living arrangements for the past 2 months: Apartment                   DME Agency: NA    Readmission Risk Interventions     No data to display

## 2022-03-28 NOTE — Progress Notes (Signed)
Assisted patient with walking while on ventilator with PT and RN.

## 2022-03-28 NOTE — Progress Notes (Signed)
Physical Therapy Treatment Patient Details Name: Sheila Fernandez MRN: [id number] DOB: 14-Apr-1951 Today's Date: 03/28/2022   History of Present Illness Pt is a 71 y.o. female admitted 03/11/22 with chest pain, abdominal pain, SOB, FTT; admit to ICU with concern for possible aortic dissection. Pt with hypotension and AMS 6/14; ETT 6/14-6/15. CTA 6/14 with no evidence of aortic dissection. 6/15 code blue called due to unresponsiveness/bradycardia, concern for seizure; reintubated; LTM EEG without definitive seizures. 6/19 extubated and reintubated within the hour. S/p trach 6/20. BAL on 6/22 showed rare pseudomonas. S/p bronchoscopy 6/26 for pulmonary toilet. PMH includes HTN, orthostatic hypotension, sickle cell trait, arthritis.   PT Comments    Pt progressing with mobility. Pt seen with RT and RN to progress to hallway ambulation on vent. Pt tolerated multiple sit<>stands and ambulating ~46' with RW, min-maxA with increasing fatigue and multiple seated rest breaks. Pt motivated to participate despite c/o fatigue, SOB and dizziness. VSS per RN; RT notes respiratory rate remained </ 24 with mobility. Pt remains limited by generalized weakness, decreased activity tolerance, and impaired balance strategies/postural reactions. Continue to recommend intensive AIR-level therapies to maximize functional mobility and independence if pt able to wean.    Recommendations for follow up therapy are one component of a multi-disciplinary discharge planning process, led by the attending physician.  Recommendations may be updated based on patient status, additional functional criteria and insurance authorization.  Follow Up Recommendations  PT at Long-term acute care hospital (AIR if off vent)     Assistance Recommended at Discharge Frequent or constant Supervision/Assistance  Patient can return home with the following A lot of help with walking and/or transfers;A lot of help with  bathing/dressing/bathroom;Assistance with cooking/housework;Assist for transportation;Help with stairs or ramp for entrance   Equipment Recommendations  Rolling walker (2 wheels);BSC/3in1;Wheelchair (measurements PT);Wheelchair cushion (measurements PT)    Recommendations for Other Services       Precautions / Restrictions Precautions Precautions: Fall;Other (comment) Precaution Comments: trach, cortrak, vent weaning (PC/CPAP, 40% FiO2, PEEP 5) Restrictions Weight Bearing Restrictions: No     Mobility  Bed Mobility Overal bed mobility: Needs Assistance Bed Mobility: Supine to Sit     Supine to sit: Min assist, HOB elevated          Transfers Overall transfer level: Needs assistance Equipment used: Rolling walker (2 wheels) Transfers: Sit to/from Stand Sit to Stand: Mod assist, Min assist, +2 safety/equipment           General transfer comment: 2x stand from EOB to RW, modA for trunk elevation; additional 2-3x stand from recliner, repeated cues for hand placement, min-modA for trunk elevation    Ambulation/Gait Ambulation/Gait assistance: Min assist, Mod assist, Max assist (+3 lines/chair/vent) Gait Distance (Feet): 12 Feet (+ 28' + 6') Assistive device: Rolling walker (2 wheels) Gait Pattern/deviations: Step-to pattern, Narrow base of support, Trunk flexed, Staggering left, Staggering right, Shuffle Gait velocity: Decreased     General Gait Details: slow, unsteady, fatigued gait with RW (+3 assist with RT for vent management, RN for lines/chair follow); pt requiring consistent minA for stability, increasing to maxA with worsening fatigue; cues for wider BOS and upright posture, pt with increased foward flexion with fatigue; 2x prolonged seated rest breaks to recover fatigue and SOB. RT said pt on full vent support to walk   Stairs             Wheelchair Mobility    Modified Rankin (Stroke Patients Only)       Balance Overall balance  assessment:  Needs assistance Sitting-balance support: Feet unsupported Sitting balance-Leahy Scale: Fair     Standing balance support: During functional activity, Reliant on assistive device for balance Standing balance-Leahy Scale: Poor Standing balance comment: reliant on UE support and external assist                            Cognition Arousal/Alertness: Awake/alert Behavior During Therapy: WFL for tasks assessed/performed Overall Cognitive Status: Within Functional Limits for tasks assessed                                 General Comments: able to clearly mouth words and make needs known; fatigued        Exercises      General Comments General comments (skin integrity, edema, etc.): initial frequent BP checks, VSS on vent support via trach; HR 70s      Pertinent Vitals/Pain Pain Assessment Pain Assessment: No/denies pain Pain Intervention(s): Monitored during session    Home Living                          Prior Function            PT Goals (current goals can now be found in the care plan section) Acute Rehab PT Goals Patient Stated Goal: plans to return to daughter's home, but she's hopeful to eventually return to her own home with increased independence PT Goal Formulation: With patient Time For Goal Achievement: 04/11/22 Potential to Achieve Goals: Good Progress towards PT goals: Progressing toward goals    Frequency    Min 3X/week      PT Plan Current plan remains appropriate    Co-evaluation              AM-PAC PT "6 Clicks" Mobility   Outcome Measure  Help needed turning from your back to your side while in a flat bed without using bedrails?: A Little Help needed moving from lying on your back to sitting on the side of a flat bed without using bedrails?: A Little Help needed moving to and from a bed to a chair (including a wheelchair)?: A Lot Help needed standing up from a chair using your arms (e.g., wheelchair  or bedside chair)?: A Lot Help needed to walk in hospital room?: Total Help needed climbing 3-5 steps with a railing? : Total 6 Click Score: 12    End of Session Equipment Utilized During Treatment: Gait belt;Oxygen (vent via trach) Activity Tolerance: Patient tolerated treatment well Patient left: in chair;with call bell/phone within reach;with nursing/sitter in room Nurse Communication: Mobility status PT Visit Diagnosis: Other abnormalities of gait and mobility (R26.89);Difficulty in walking, not elsewhere classified (R26.2);Muscle weakness (generalized) (M62.81)     Time: 9476-5465 PT Time Calculation (min) (ACUTE ONLY): 25 min  Charges:  $Gait Training: 8-22 mins $Therapeutic Activity: 8-22 mins                     Mabeline Caras, PT, DPT Acute Rehabilitation Services  Personal: [place] [facility] Office: [place] 03/28/2022, 1:01 PM

## 2022-03-28 NOTE — Progress Notes (Signed)
SLP Cancellation Note  Patient Details Name: Sheila Fernandez MRN: [id number] DOB: 1951/09/21   Cancelled treatment:       Reason Eval/Treat Not Completed: Patient at procedure or test/unavailable (Pt ambulating with PT at this time. SLP will follow up later as schedule allows.)  Rydan Gulyas I. Hardin Negus, [place], Reader Office number [phone] Pager [place] 03/28/2022, 2:21 PM

## 2022-03-29 DIAGNOSIS — G909 Disorder of the autonomic nervous system, unspecified: Secondary | ICD-10-CM | POA: Diagnosis not present

## 2022-03-29 DIAGNOSIS — R001 Bradycardia, unspecified: Secondary | ICD-10-CM | POA: Diagnosis not present

## 2022-03-29 DIAGNOSIS — J9601 Acute respiratory failure with hypoxia: Secondary | ICD-10-CM | POA: Diagnosis not present

## 2022-03-29 LAB — COMPREHENSIVE METABOLIC PANEL
ALT: 67 U/L — ABNORMAL HIGH (ref 0–44)
AST: 16 U/L (ref 15–41)
Albumin: 2.3 g/dL — ABNORMAL LOW (ref 3.5–5.0)
Alkaline Phosphatase: 64 U/L (ref 38–126)
Anion gap: 7 (ref 5–15)
BUN: 50 mg/dL — ABNORMAL HIGH (ref 8–23)
CO2: 35 mmol/L — ABNORMAL HIGH (ref 22–32)
Calcium: 9.6 mg/dL (ref 8.9–10.3)
Chloride: 107 mmol/L (ref 98–111)
Creatinine, Ser: 0.8 mg/dL (ref 0.44–1.00)
GFR, Estimated: >60 mL/min (ref >60)
Glucose, Bld: 126 mg/dL — ABNORMAL HIGH (ref 70–99)
Potassium: 4.3 mmol/L (ref 3.5–5.1)
Sodium: 149 mmol/L — ABNORMAL HIGH (ref 135–145)
Total Bilirubin: 0.8 mg/dL (ref 0.3–1.2)
Total Protein: 6 g/dL — ABNORMAL LOW (ref 6.5–8.1)

## 2022-03-29 LAB — GLUCOSE, CAPILLARY
Glucose-Capillary: 132 mg/dL — ABNORMAL HIGH (ref 70–99)
Glucose-Capillary: 141 mg/dL — ABNORMAL HIGH (ref 70–99)
Glucose-Capillary: 102 mg/dL — ABNORMAL HIGH (ref 70–99)
Glucose-Capillary: 119 mg/dL — ABNORMAL HIGH (ref 70–99)

## 2022-03-29 LAB — T4, FREE: Free T4: 1.01 ng/dL (ref 0.61–1.12)

## 2022-03-29 LAB — TSH: TSH: 1.709 u[IU]/mL (ref 0.350–4.500)

## 2022-03-29 MED ORDER — ALPRAZOLAM 0.5 MG PO TABS
0.5000 mg | ORAL_TABLET | Freq: Three times a day (TID) | ORAL | Status: DC | PRN
  Administered 2022-03-30 – 2022-04-02 (×5): 0.5 mg
  Filled 2022-03-29 (×5): qty 1

## 2022-03-29 MED ORDER — FREE WATER
200.0000 mL | Status: DC
Start: 2022-03-29 — End: 2022-03-31
  Administered 2022-03-29 – 2022-03-31 (×12): 200 mL

## 2022-03-29 MED ORDER — ALPRAZOLAM 0.5 MG PO TABS
0.5000 mg | ORAL_TABLET | Freq: Three times a day (TID) | ORAL | Status: DC | PRN
  Administered 2022-03-29 (×2): 0.5 mg via ORAL
  Filled 2022-03-29 (×2): qty 1

## 2022-03-29 MED ORDER — DOCUSATE SODIUM 50 MG/5ML PO LIQD
100.0000 mg | Freq: Two times a day (BID) | ORAL | Status: DC | PRN
Start: 2022-03-29 — End: 2022-04-08
  Administered 2022-03-30 – 2022-04-01 (×2): 100 mg
  Filled 2022-03-29 (×2): qty 10

## 2022-03-29 MED ORDER — OXYCODONE HCL 5 MG PO TABS
5.0000 mg | ORAL_TABLET | ORAL | Status: DC | PRN
  Administered 2022-03-29 – 2022-03-30 (×3): 5 mg
  Administered 2022-04-05 – 2022-04-06 (×2): 10 mg
  Administered 2022-04-06: 5 mg
  Administered 2022-04-06: 10 mg
  Administered 2022-04-07 – 2022-04-08 (×2): 5 mg
  Filled 2022-03-29: qty 1
  Filled 2022-03-29: qty 2
  Filled 2022-03-29: qty 1
  Filled 2022-03-29: qty 2
  Filled 2022-03-29 (×2): qty 1
  Filled 2022-03-29: qty 2
  Filled 2022-03-29 (×2): qty 1

## 2022-03-29 NOTE — Progress Notes (Signed)
Pt started having accessory muscle use with trach collar and elevated HR. RN and RT at bedside to witness. RT placed pt back full support ventilation at this time.

## 2022-03-29 NOTE — Progress Notes (Signed)
Pt was placed on trach collar 5L/28%. Pt is tolerating at this time, however seems very weak. RN and RT will monitor closely.

## 2022-03-29 NOTE — Progress Notes (Signed)
NAMEANYSA TACEY, MRN:  [id number], DOB:  1951/05/20, LOS: 80 ADMISSION DATE:  03/11/2022, CONSULTATION DATE:  03/11/22 REFERRING MD:  Darl Householder, CHIEF COMPLAINT:  Chest pain, abdominal pain  History of Present Illness:  71 yo woman with chest pain, abdominal pain, SOB. Sudden onset epigastric pain radiating to back since yesterday.  BP elevated always, missed her bp meds this am. CT chest several months ago, mass vs atelectasis.  Here with mid abdominal pain since yesterday.  Low back pain.  SOB, fatigue x 3 months Fatigue and weight loss x 6 months.  Recent abdominal pain, started on protonix   Admitted to ICU, CT abdomen/pelvis negative for AAS, initially on esmolol infusion then required Levophed for hypotension and intubation for encephalopathy. Spot EEG and head CT were negative.  She was extubated 6/14 and transferred out of the ICU.   Overnight on 6/15, pt was ambulating and conversing with family when she suddenly became restless and then altered and had an episode of bradycardia to the 40's.  A code blue was called, but pt never lost pulses, she did require bagging for a short time.  Her HR improved without medications and she was responding to some commands, but had upwardly deviated gaze and flexion of the R arm.  She was transferred to the ICU and re-intubated and loaded with Keppra   Pertinent  Medical History  HTN, Orthostatic hypotension, Anemia, Arthritis, Iritis, Recurrent Sickle cell trait  Echocardiogram 12/10/2021: Left ventricle cavity is normal in size and wall thickness. Normal global wall motion. Normal LV systolic function with EF 61%. Doppler evidence of grade I (impaired) diastolic dysfunction, normal LAP. Mild mitral valve stenosis. Mild tricuspid regurgitation. IVC not seen. No significant change compared to previous study in 2019.  Lexiscan Tetrofosmin stress test 02/01/2022:Normal myocardial perfusion. Stress LVEF 57%.Low risk study.  Significant  Hospital Events: Including procedures, antibiotic start and stop dates in addition to other pertinent events   6/13 admitted to ICU for esmolol gtt with question of possible dissection 6/14 hypotensive, AMS on esmolol 300. Intubated. Starting NE. CT H neg. CTA no AAS. BAL: cytology neg.  Extubate 6/15 6/15-16 night Episode of AMS and bradycardia, concern for sz, re-intubated and loaded with Keppra 6/16 still no sz on EEG. MRI c spine and brain: and without contrast. Mild foraminal narrowing bilaterally at C2-3.Severe left and moderate right foraminal stenosis at C3-4.Moderate foraminal narrowing at C4-5 is worse left than right.  Moderate right and mild left foraminal narrowing at C5-6.Mild left foraminal narrowing at C6-7. No sig change compared w/ MRI 4/11; No acute intracranial abnormality or significant interval change. 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 This likely reflects the sequela of chronic microvascular ischemia. 6/17 failed SBT attempt. PCXR w/ right sided PNA. Cultures sent. Unasyn started.  6/18 continued to spike fever despite addition of Unasyn, switched to [place] 6/19 extubated, had to be reintubated for hypercapnia 6/20: Trached 6/26: Bronchoscopy for pulmonary toilette.  7/1: Trach collar trial   Interim History / Subjective:   Patient is much more awake alert this morning following commands.  Objective   Blood pressure 117/70, pulse 80, temperature 99.2 F (37.3 C), temperature source Axillary, resp. rate 18, height _0  (1.549 m), weight 57.3 kg, SpO2 97 %.    Vent Mode: PSV;CPAP FiO2 (%):  [40 %] 40 % Set Rate:  [18 bmp] 18 bmp Vt Set:  [380 mL] 380 mL PEEP:  [5 cmH20] 5  cmH20 Pressure Support:  [8 cmH20-10 cmH20] 8 cmH20 Plateau Pressure:  [12 SWN46-27 cmH20] 12 cmH20   Intake/Output Summary (Last 24 hours) at 03/29/2022 0813 Last data filed at 03/29/2022 0700 Gross per 24 hour  Intake 1330 ml  Output  2200 ml  Net -870 ml   Filed Weights   03/27/22 0500 03/28/22 0500 03/29/22 0500  Weight: 58.5 kg 58 kg 57.3 kg     Physical exam: General: Chronically ill-appearing female status post tracheostomy tube placement HEENT: NCAT, tracking appropriately, core track in place Neuro: Awake alert communicating using board Chest: Decreased breath sounds, bilateral mechanical ventilated breath sounds Heart: Regular rate and rhythm, S1-S2 Abdomen: Soft, nontender nondistended, bowel sounds present Skin: No rash  Ancillary tests personally reviewed:   CXR shows persistent decreased lung volume right side.   Assessment & Plan:   Tracheostomy tube dependence, chronic respiratory failure Plan: Attempt trial for TCT. Placed on pressure support for 30 to 45 minutes to see how she does before transition to TCT. Discussed plan with RT.  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 She has no current clinical sign of infection. Plan: Observe temperature curve white blood cell count or any other concerning sign of infection. Hold off on further antibiotics Continue to monitor  Possible autonomic dysfunction Severe cervical radiculopathy and weakness of the upper extremities Plan: There is yet to be a clear reason for her bradycardic and autonomic type responses.  She has not had any documented seizure on EEG.  She is on multiple medications and I think polypharmacy is an important potential underlying cause. Therefore we will continue to stop as many medications as we possibly can. Keppra has been stopped, propranolol, hydralazine, BuSpar Morphine immediate release has been stopped and replaced with as needed oxycodone. Atropine available if bradycardia events resume.  Protein-calorie malnutrition, severe Generalized muscle waisting over the course of last several months.  May be related to underlying neurologic process? Plan: Continue tube feeds  Sickle cell trait (HCC)  Hypernatremia Increase free water.  Positive cumulative fluid balance Additional diuresis, held, patient on exam clinically looks euvolemic.  Long-term care need Plan she would be a great candidate for facility with long-term vent wean and physical therapy.  Potential for select evaluation on Monday, 03/31/2022.  I have discussed with case management.  Best Practice (right click and "Reselect all SmartList Selections" daily)   Diet/type: tubefeeds DVT prophylaxis: SCD and heparin GI prophylaxis: PPI Lines: Central line remove arterial line Foley:  N/A Purewick Code Status:  full code Last date of multidisciplinary goals of care discussion.  This patient is critically ill with multiple organ system failure; which, requires frequent high complexity decision making, assessment, support, evaluation, and titration of therapies. This was completed through the application of advanced monitoring technologies and extensive interpretation of multiple databases. During this encounter critical care time was devoted to patient care services described in this note for 32 minutes.  [facility] 03/29/2022 8:13 AM

## 2022-03-30 DIAGNOSIS — J9601 Acute respiratory failure with hypoxia: Secondary | ICD-10-CM | POA: Diagnosis not present

## 2022-03-30 LAB — BASIC METABOLIC PANEL
Anion gap: 5 (ref 5–15)
BUN: 48 mg/dL — ABNORMAL HIGH (ref 8–23)
CO2: 36 mmol/L — ABNORMAL HIGH (ref 22–32)
Calcium: 9.4 mg/dL (ref 8.9–10.3)
Chloride: 107 mmol/L (ref 98–111)
Creatinine, Ser: 0.8 mg/dL (ref 0.44–1.00)
GFR, Estimated: >60 mL/min (ref >60)
Glucose, Bld: 125 mg/dL — ABNORMAL HIGH (ref 70–99)
Potassium: 4.1 mmol/L (ref 3.5–5.1)
Sodium: 148 mmol/L — ABNORMAL HIGH (ref 135–145)

## 2022-03-30 LAB — GLUCOSE, CAPILLARY
Glucose-Capillary: 103 mg/dL — ABNORMAL HIGH (ref 70–99)
Glucose-Capillary: 113 mg/dL — ABNORMAL HIGH (ref 70–99)
Glucose-Capillary: 114 mg/dL — ABNORMAL HIGH (ref 70–99)
Glucose-Capillary: 131 mg/dL — ABNORMAL HIGH (ref 70–99)

## 2022-03-30 LAB — CBC
HCT: 22.7 % — ABNORMAL LOW (ref 36.0–46.0)
Hemoglobin: 7 g/dL — ABNORMAL LOW (ref 12.0–15.0)
MCH: 28.8 pg (ref 26.0–34.0)
MCHC: 30.8 g/dL (ref 30.0–36.0)
MCV: 93.4 fL (ref 80.0–100.0)
Platelets: 309 10*3/uL (ref 150–400)
RBC: 2.43 MIL/uL — ABNORMAL LOW (ref 3.87–5.11)
RDW: 15.9 % — ABNORMAL HIGH (ref 11.5–15.5)
WBC: 6.5 10*3/uL (ref 4.0–10.5)
nRBC: 0 % (ref 0.0–0.2)

## 2022-03-30 MED ORDER — ORAL CARE MOUTH RINSE
15.0000 mL | OROMUCOSAL | Status: DC | PRN
Start: 2022-03-30 — End: 2022-04-08

## 2022-03-30 MED ORDER — ORAL CARE MOUTH RINSE
15.0000 mL | OROMUCOSAL | Status: DC
  Administered 2022-03-30 – 2022-04-08 (×93): 15 mL via OROMUCOSAL

## 2022-03-30 NOTE — Progress Notes (Signed)
Rt had to place pt back on full support ventilation. Pt was breathing in mid 30s using accessory muscles, sats decreased into 60s, and HR was in mid 120s. RN and RT at bedside to witness. RT will monitor.

## 2022-03-30 NOTE — Progress Notes (Addendum)
Sheila Fernandez, MRN:  [id number], DOB:  Apr 03, 1951, LOS: 30 ADMISSION DATE:  03/11/2022, CONSULTATION DATE:  03/11/22 REFERRING MD:  Darl Householder, CHIEF COMPLAINT:  Chest pain, abdominal pain  History of Present Illness:  71 yo woman with chest pain, abdominal pain, SOB. Sudden onset epigastric pain radiating to back since yesterday.  BP elevated always, missed her bp meds this am. CT chest several months ago, mass vs atelectasis.  Here with mid abdominal pain since yesterday.  Low back pain.  SOB, fatigue x 3 months Fatigue and weight loss x 6 months.  Recent abdominal pain, started on protonix   Admitted to ICU, CT abdomen/pelvis negative for AAS, initially on esmolol infusion then required Levophed for hypotension and intubation for encephalopathy. Spot EEG and head CT were negative.  She was extubated 6/14 and transferred out of the ICU.   Overnight on 6/15, pt was ambulating and conversing with family when she suddenly became restless and then altered and had an episode of bradycardia to the 40's.  A code blue was called, but pt never lost pulses, she did require bagging for a short time.  Her HR improved without medications and she was responding to some commands, but had upwardly deviated gaze and flexion of the R arm.  She was transferred to the ICU and re-intubated and loaded with Keppra   Pertinent  Medical History  HTN, Orthostatic hypotension, Anemia, Arthritis, Iritis, Recurrent Sickle cell trait  Echocardiogram 12/10/2021: Left ventricle cavity is normal in size and wall thickness. Normal global wall motion. Normal LV systolic function with EF 61%. Doppler evidence of grade I (impaired) diastolic dysfunction, normal LAP. Mild mitral valve stenosis. Mild tricuspid regurgitation. IVC not seen. No significant change compared to previous study in 2019.  Lexiscan Tetrofosmin stress test 02/01/2022:Normal myocardial perfusion. Stress LVEF 57%.Low risk study.  Significant  Hospital Events: Including procedures, antibiotic start and stop dates in addition to other pertinent events   6/13 admitted to ICU for esmolol gtt with question of possible dissection 6/14 hypotensive, AMS on esmolol 300. Intubated. Starting NE. CT H neg. CTA no AAS. BAL: cytology neg.  Extubate 6/15 6/15-16 night Episode of AMS and bradycardia, concern for sz, re-intubated and loaded with Keppra 6/16 still no sz on EEG. MRI c spine and brain: and without contrast. Mild foraminal narrowing bilaterally at C2-3.Severe left and moderate right foraminal stenosis at C3-4.Moderate foraminal narrowing at C4-5 is worse left than right.  Moderate right and mild left foraminal narrowing at C5-6.Mild left foraminal narrowing at C6-7. No sig change compared w/ MRI 4/11; No acute intracranial abnormality or significant interval change. 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 This likely reflects the sequela of chronic microvascular ischemia. 6/17 failed SBT attempt. PCXR w/ right sided PNA. Cultures sent. Unasyn started.  6/18 continued to spike fever despite addition of Unasyn, switched to [place] 6/19 extubated, had to be reintubated for hypercapnia 6/20: Trached 6/26: Bronchoscopy for pulmonary toilette.  7/1: Trach collar trial 7/2: PS trial , failed again, will need slow wean    Interim History / Subjective:   Alert doing well. Failed PS trial again   Objective   Blood pressure 138/89, pulse 87, temperature 98.3 F (36.8 C), temperature source Oral, resp. rate (!) 21, height _0  (1.549 m), weight 54.4 kg, SpO2 95 %.    Vent Mode: PRVC FiO2 (%):  [28 %-40 %] 40 % Set Rate:  [18 bmp] 18 bmp Vt  Set:  [380 mL] 380 mL PEEP:  [5 cmH20] 5 cmH20 Plateau Pressure:  [16 cmH20-20 cmH20] 20 cmH20   Intake/Output Summary (Last 24 hours) at 03/30/2022 0946 Last data filed at 03/30/2022 0800 Gross per 24 hour  Intake 2400 ml  Output 2450 ml  Net -50  ml   Filed Weights   03/28/22 0500 03/29/22 0500 03/30/22 0500  Weight: 58 kg 57.3 kg 54.4 kg     Physical exam: General: chronically ill appearing, resting in bed, overall doing well HEENT: NCAT tracking  Neuro: awake alert, following commands  Chest: diminished BL vented breaths  Heart: RRR, s1 s2  Abdomen: soft, nt nd  Skin: no rash   Ancillary tests personally reviewed:   CXR shows persistent decreased lung volume right side.   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 She has no current clinical sign of infection. Plan: Holding abx  No signs of infection at this time   Possible autonomic dysfunction Severe cervical radiculopathy and weakness of the upper extremities Plan: I believe a lot of the brady issues and hypotension was medication effect This has all stopped with the cessation of multiple medications on the mar Keppra stopped, propranolol, hydral and buspar stopped  Morphine immediate release stopped   Protein-calorie malnutrition, severe Generalized muscle waisting over the course of last several months. May be related to underlying neurologic process? Plan: Continue tube feeds   Sickle cell trait (HCC)  Hypernatremia Continue free water replacement   Positive cumulative fluid balance Holding additional diuresis today   Long-term care need [facility] and long term vent wean   Best Practice (right click and "Reselect all SmartList Selections" daily)   Diet/type: tubefeeds DVT prophylaxis: SCD and heparin GI prophylaxis: PPI Lines: Central line remove arterial line Foley:  N/A Purewick Code Status:  full code Last date of multidisciplinary goals of care  discussion.  This patient is critically ill with multiple organ system failure; which, requires frequent high complexity decision making, assessment, support, evaluation, and titration of therapies. This was completed through the application of advanced monitoring technologies and extensive interpretation of multiple databases. During this encounter critical care time was devoted to patient care services described in this note for 34 minutes.   [place] 03/30/2022 9:46 AM

## 2022-03-30 NOTE — Progress Notes (Addendum)
Pt was placed on trach collar 5L/28%. RT will monitor.

## 2022-03-31 DIAGNOSIS — Z9911 Dependence on respirator [ventilator] status: Secondary | ICD-10-CM | POA: Diagnosis not present

## 2022-03-31 DIAGNOSIS — Z930 Tracheostomy status: Secondary | ICD-10-CM

## 2022-03-31 DIAGNOSIS — L8990 Pressure ulcer of unspecified site, unspecified stage: Secondary | ICD-10-CM | POA: Insufficient documentation

## 2022-03-31 DIAGNOSIS — J9601 Acute respiratory failure with hypoxia: Secondary | ICD-10-CM | POA: Diagnosis not present

## 2022-03-31 LAB — GLUCOSE, CAPILLARY
Glucose-Capillary: 123 mg/dL — ABNORMAL HIGH (ref 70–99)
Glucose-Capillary: 107 mg/dL — ABNORMAL HIGH (ref 70–99)
Glucose-Capillary: 139 mg/dL — ABNORMAL HIGH (ref 70–99)
Glucose-Capillary: 112 mg/dL — ABNORMAL HIGH (ref 70–99)
Glucose-Capillary: 83 mg/dL (ref 70–99)
Glucose-Capillary: 128 mg/dL — ABNORMAL HIGH (ref 70–99)

## 2022-03-31 LAB — CBC
HCT: 25.1 % — ABNORMAL LOW (ref 36.0–46.0)
Hemoglobin: 7.7 g/dL — ABNORMAL LOW (ref 12.0–15.0)
MCH: 28.6 pg (ref 26.0–34.0)
MCHC: 30.7 g/dL (ref 30.0–36.0)
MCV: 93.3 fL (ref 80.0–100.0)
Platelets: 331 10*3/uL (ref 150–400)
RBC: 2.69 MIL/uL — ABNORMAL LOW (ref 3.87–5.11)
RDW: 15.7 % — ABNORMAL HIGH (ref 11.5–15.5)
WBC: 5.8 10*3/uL (ref 4.0–10.5)
nRBC: 0 % (ref 0.0–0.2)

## 2022-03-31 LAB — BASIC METABOLIC PANEL
Anion gap: 6 (ref 5–15)
BUN: 37 mg/dL — ABNORMAL HIGH (ref 8–23)
CO2: 35 mmol/L — ABNORMAL HIGH (ref 22–32)
Calcium: 9.6 mg/dL (ref 8.9–10.3)
Chloride: 107 mmol/L (ref 98–111)
Creatinine, Ser: 0.65 mg/dL (ref 0.44–1.00)
GFR, Estimated: >60 mL/min (ref >60)
Glucose, Bld: 122 mg/dL — ABNORMAL HIGH (ref 70–99)
Potassium: 4.1 mmol/L (ref 3.5–5.1)
Sodium: 148 mmol/L — ABNORMAL HIGH (ref 135–145)

## 2022-03-31 LAB — T3, FREE: T3, Free: 1.8 pg/mL — ABNORMAL LOW (ref 2.0–4.4)

## 2022-03-31 MED ORDER — LACTATED RINGERS IV BOLUS
500.0000 mL | Freq: Once | INTRAVENOUS | Status: AC
  Administered 2022-03-31: 500 mL via INTRAVENOUS

## 2022-03-31 MED ORDER — FREE WATER
300.0000 mL | Status: DC
Start: 2022-03-31 — End: 2022-04-06
  Administered 2022-03-31 – 2022-04-06 (×35): 300 mL

## 2022-03-31 MED ORDER — FUROSEMIDE 10 MG/ML IJ SOLN
40.0000 mg | Freq: Two times a day (BID) | INTRAMUSCULAR | Status: DC
Start: 2022-03-31 — End: 2022-03-31
  Administered 2022-03-31: 40 mg via INTRAVENOUS
  Filled 2022-03-31: qty 4

## 2022-03-31 MED ORDER — SODIUM CHLORIDE 3 % IN NEBU
4.0000 mL | INHALATION_SOLUTION | Freq: Three times a day (TID) | RESPIRATORY_TRACT | Status: AC
  Administered 2022-03-31 – 2022-04-02 (×9): 4 mL via RESPIRATORY_TRACT
  Filled 2022-03-31 (×9): qty 15

## 2022-03-31 MED ORDER — ENOXAPARIN SODIUM 40 MG/0.4ML IJ SOSY
40.0000 mg | PREFILLED_SYRINGE | Freq: Every day | INTRAMUSCULAR | Status: DC
  Administered 2022-03-31 – 2022-04-08 (×9): 40 mg via SUBCUTANEOUS
  Filled 2022-03-31 (×9): qty 0.4

## 2022-03-31 NOTE — Progress Notes (Signed)
Physical Therapy Treatment Patient Details Name: SAFFRON BUSEY MRN: [id number] DOB: 18-Mar-1951 Today's Date: 03/31/2022   History of Present Illness Pt is a 71 y.o. female admitted 03/11/22 with chest pain, abdominal pain, SOB, FTT; admit to ICU with concern for possible aortic dissection. Pt with hypotension and AMS 6/14; ETT 6/14-6/15. CTA 6/14 with no evidence of aortic dissection. 6/15 code blue called due to unresponsiveness/bradycardia, concern for seizure; reintubated; LTM EEG without definitive seizures. 6/19 extubated and reintubated within the hour. S/p trach 6/20. BAL on 6/22 showed rare pseudomonas. S/p bronchoscopy 6/26 for pulmonary toilet. PMH includes HTN, orthostatic hypotension, sickle cell trait, arthritis.    PT Comments    Pt pleasant and very willing to walk on vent today with coordination with RT and RN. Pt with chair follow and progressing distance limited by incontinence. Post gait and BSC use pt in chair with decreased responsiveness and decreased expiratory volume with RT present to flip pt from CPAP/PS to PRVC 40% for recovery. Will continue to follow to work toward increased activity tolerance.   SpO2 99-100% throughout session  HR 84-102 BP 143/90    Recommendations for follow up therapy are one component of a multi-disciplinary discharge planning process, led by the attending physician.  Recommendations may be updated based on patient status, additional functional criteria and insurance authorization.  Follow Up Recommendations  PT at Long-term acute care hospital (AIR if off vent)     Assistance Recommended at Discharge Frequent or constant Supervision/Assistance  Patient can return home with the following A lot of help with walking and/or transfers;A lot of help with bathing/dressing/bathroom;Assistance with cooking/housework;Assist for transportation;Help with stairs or ramp for entrance   Equipment Recommendations  Rolling walker (2  wheels);BSC/3in1;Wheelchair (measurements PT);Wheelchair cushion (measurements PT)    Recommendations for Other Services       Precautions / Restrictions Precautions Precautions: Fall;Other (comment) Precaution Comments: trach, cortrak, vent     Mobility  Bed Mobility               General bed mobility comments: in chair on arrival and end of session    Transfers Overall transfer level: Needs assistance   Transfers: Sit to/from Stand Sit to Stand: Min assist           General transfer comment: min assist to rise from recliner and BSC x 3 trials with pivot recliner<>BSC after gait    Ambulation/Gait Ambulation/Gait assistance: +2 safety/equipment, Min assist Gait Distance (Feet): 200 Feet Assistive device: Rolling walker (2 wheels) Gait Pattern/deviations: Step-through pattern, Decreased stride length, Trunk flexed   Gait velocity interpretation: <1.8 ft/sec, indicate of risk for recurrent falls   General Gait Details: cues for posture, looking up and safety. Min assist x 2 during gait to move RW to midline and for single LOB. Pt determining gait distance on CPAP/PS with 100% FiO2 during gait with SpO2 100% however limited by urinary incontinence   Stairs             Wheelchair Mobility    Modified Rankin (Stroke Patients Only)       Balance Overall balance assessment: Needs assistance   Sitting balance-Leahy Scale: Fair Sitting balance - Comments: unsupported sitting   Standing balance support: During functional activity, Reliant on assistive device for balance, Bilateral upper extremity supported Standing balance-Leahy Scale: Poor Standing balance comment: RW for standing and gait  Cognition Arousal/Alertness: Awake/alert Behavior During Therapy: WFL for tasks assessed/performed Overall Cognitive Status: Within Functional Limits for tasks assessed                                 General  Comments: able to clearly mouth words and write        Exercises      General Comments        Pertinent Vitals/Pain Pain Assessment Pain Assessment: No/denies pain    Home Living                          Prior Function            PT Goals (current goals can now be found in the care plan section) Progress towards PT goals: Progressing toward goals    Frequency    Min 3X/week      PT Plan Current plan remains appropriate    Co-evaluation              AM-PAC PT "6 Clicks" Mobility   Outcome Measure  Help needed turning from your back to your side while in a flat bed without using bedrails?: A Little Help needed moving from lying on your back to sitting on the side of a flat bed without using bedrails?: A Little Help needed moving to and from a bed to a chair (including a wheelchair)?: A Little Help needed standing up from a chair using your arms (e.g., wheelchair or bedside chair)?: A Little Help needed to walk in hospital room?: Total Help needed climbing 3-5 steps with a railing? : Total 6 Click Score: 14    End of Session Equipment Utilized During Treatment: Other (comment) (vent) Activity Tolerance: Patient tolerated treatment well Patient left: in chair;with call bell/phone within reach;with nursing/sitter in room Nurse Communication: Mobility status PT Visit Diagnosis: Other abnormalities of gait and mobility (R26.89);Difficulty in walking, not elsewhere classified (R26.2);Muscle weakness (generalized) (M62.81)     Time: 1583-0940 PT Time Calculation (min) (ACUTE ONLY): 32 min  Charges:  $Gait Training: 8-22 mins $Therapeutic Activity: 8-22 mins                     Bayard Males, PT Acute Rehabilitation Services Office: [place] 03/31/2022, 11:36 AM

## 2022-03-31 NOTE — Progress Notes (Signed)
RT assisted with patient walking while on vent.  After the walk, patient became SOB and had decreased VTe. Patient placed back on full vent support and suction. Patient tolerated well.  RN and PT at bedside.

## 2022-03-31 NOTE — Progress Notes (Signed)
eLink Physician-Brief Progress Note Patient Name: Sheila Fernandez DOB: 02-Sep-1951 MRN: [id number]   Date of Service  03/31/2022  HPI/Events of Note  Notified of hypotension with BP 88/53, HR 50s when asleep.  Pt wakes up to stimulation and improvement of BP.   eICU Interventions  Give 500cc LR bolus now.  Atropine at the bedside for HR <50.      Intervention Category Intermediate Interventions: Hypotension - evaluation and management  Elsie Lincoln 03/31/2022, 11:18 PM

## 2022-03-31 NOTE — Progress Notes (Signed)
CPT not done at this time. Patient is in the chair asleep. MD notified.

## 2022-03-31 NOTE — Plan of Care (Signed)
  Problem: Education: Goal: Knowledge of General Education information will improve Description: Including pain rating scale, medication(s)/side effects and non-pharmacologic comfort measures Outcome: Progressing   Problem: Health Behavior/Discharge Planning: Goal: Ability to manage health-related needs will improve Outcome: Progressing   Problem: Clinical Measurements: Goal: Ability to maintain clinical measurements within normal limits will improve Outcome: Progressing Goal: Will remain free from infection Outcome: Progressing Goal: Diagnostic test results will improve Outcome: Progressing Goal: Respiratory complications will improve Outcome: Progressing Goal: Cardiovascular complication will be avoided Outcome: Progressing   Problem: Activity: Goal: Risk for activity intolerance will decrease Outcome: Progressing   Problem: Nutrition: Goal: Adequate nutrition will be maintained Outcome: Progressing   Problem: Coping: Goal: Level of anxiety will decrease Outcome: Progressing   Problem: Elimination: Goal: Will not experience complications related to bowel motility Outcome: Progressing Goal: Will not experience complications related to urinary retention Outcome: Progressing   Problem: Pain Managment: Goal: General experience of comfort will improve Outcome: Progressing   Problem: Skin Integrity: Goal: Risk for impaired skin integrity will decrease Outcome: Progressing   Problem: Safety: Goal: Ability to remain free from injury will improve Outcome: Progressing   Problem: Activity: Goal: Ability to tolerate increased activity will improve Outcome: Progressing   Problem: Respiratory: Goal: Ability to maintain a clear airway and adequate ventilation will improve Outcome: Progressing   Problem: Role Relationship: Goal: Method of communication will improve Outcome: Progressing

## 2022-03-31 NOTE — Progress Notes (Signed)
[name], MRN:  [id number], DOB:  1951/09/26, LOS: 24 ADMISSION DATE:  03/11/2022, CONSULTATION DATE:  03/11/22 REFERRING MD:  Darl Householder, CHIEF COMPLAINT:  Chest pain, abdominal pain  History of Present Illness:  71 yo woman with chest pain, abdominal pain, SOB. Sudden onset epigastric pain radiating to back since yesterday.  BP elevated always, missed her bp meds this am. CT chest several months ago, mass vs atelectasis.  Here with mid abdominal pain since yesterday.  Low back pain.  SOB, fatigue x 3 months Fatigue and weight loss x 6 months.  Recent abdominal pain, started on protonix   Admitted to ICU, CT abdomen/pelvis negative for AAS, initially on esmolol infusion then required Levophed for hypotension and intubation for encephalopathy. Spot EEG and head CT were negative.  She was extubated 6/14 and transferred out of the ICU.   Overnight on 6/15, pt was ambulating and conversing with family when she suddenly became restless and then altered and had an episode of bradycardia to the 40's.  A code blue was called, but pt never lost pulses, she did require bagging for a short time.  Her HR improved without medications and she was responding to some commands, but had upwardly deviated gaze and flexion of the R arm.  She was transferred to the ICU and re-intubated and loaded with Keppra   Pertinent  Medical History  HTN, Orthostatic hypotension, Anemia, Arthritis, Iritis, Recurrent Sickle cell trait  Echocardiogram 12/10/2021: Left ventricle cavity is normal in size and wall thickness. Normal global wall motion. Normal LV systolic function with EF 61%. Doppler evidence of grade I (impaired) diastolic dysfunction, normal LAP. Mild mitral valve stenosis. Mild tricuspid regurgitation. IVC not seen. No significant change compared to previous study in 2019.  Lexiscan Tetrofosmin stress test 02/01/2022:Normal myocardial perfusion. Stress LVEF 57%.Low risk study.  Significant  Hospital Events: Including procedures, antibiotic start and stop dates in addition to other pertinent events   6/13 admitted to ICU for esmolol gtt with question of possible dissection 6/14 hypotensive, AMS on esmolol 300. Intubated. Starting NE. CT H neg. CTA no AAS. BAL: cytology neg.  Extubate 6/15 6/15-16 night Episode of AMS and bradycardia, concern for sz, re-intubated and loaded with Keppra 6/16 still no sz on EEG. MRI c spine and brain: and without contrast. Mild foraminal narrowing bilaterally at C2-3.Severe left and moderate right foraminal stenosis at C3-4.Moderate foraminal narrowing at C4-5 is worse left than right.  Moderate right and mild left foraminal narrowing at C5-6.Mild left foraminal narrowing at C6-7. No sig change compared w/ MRI 4/11; No acute intracranial abnormality or significant interval change. 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 This likely reflects the sequela of chronic microvascular ischemia. 6/17 failed SBT attempt. PCXR w/ right sided PNA. Cultures sent. Unasyn started.  6/18 continued to spike fever despite addition of Unasyn, switched to [place] 6/19 extubated, had to be reintubated for hypercapnia 6/20: Trached 6/26: Bronchoscopy for pulmonary toilette.  7/1: Trach collar trial 7/2: PS trial , failed again, will need slow wean    Interim History / Subjective:  She denies complaints this morning. Tolerating TF.   Objective   Blood pressure 126/83, pulse 66, temperature 98.9 F (37.2 C), temperature source Oral, resp. rate (!) 22, height _0  (1.549 m), weight 54.4 kg, SpO2 (!) 89 %.    Vent Mode: PRVC FiO2 (%):  [28 %-40 %] 40 % Set Rate:  [18 bmp] 18 bmp Vt  Set:  [380 mL] 380 mL PEEP:  [5 cmH20] 5 cmH20 Plateau Pressure:  [12 cmH20-16 cmH20] 12 cmH20   Intake/Output Summary (Last 24 hours) at 03/31/2022 0726 Last data filed at 03/31/2022 0500 Gross per 24 hour  Intake 1320 ml  Output 1350  ml  Net -30 ml    Filed Weights   03/28/22 0500 03/29/22 0500 03/30/22 0500  Weight: 58 kg 57.3 kg 54.4 kg     Physical exam: General: Ill-appearing woman sitting up in recliner in no acute distress HEENT: [place]/AT, eyes anicteric, mucous moist Neck: Tracheostomy in place,  no erythema Neuro: Awake and alert, able to move all extremities on command.  Helping suction herself.  Answering questions by nodding yes/ no Chest: Rhonchi bilaterally, thick secretions suctioned.  Tolerating pressure support 10 Heart: S1-S2, regular rate and rhythm Abdomen: Soft, nontender Skin: No rash, warm and dry  Na+ 148 Bicarb 35 BN 37 Cr 0.65 H/H 7.7/25.1  Resolved problem list:  Pseudomonas pneumonia right lung Assessment & Plan:  Acute respiratory failure with hypoxia requiring mechanical ventilation Tracheostomy tube dependence, chronic respiratory failure Respiratory tract colonized with Pseudomonas Plan: -LTVV - Continue daily vent weaning - Trach care per protocol - VAP prevention protocol - Adding hypertonic saline and CPT TID  Possible autonomic dysfunction; resolved medications being discontinued Severe cervical radiculopathy and weakness of the upper extremities Plan: -Monitor  Acute anemia due to critical illness - Transfuse for hemoglobin less than 7 or hemodynamically significant bleeding -Monitor  Protein-calorie malnutrition, severe Generalized muscle waisting over the course of last several months. May be related to underlying neurologic process? -Continue tube feeds, vitamins - Continue physical therapy, occupational therapy  Pressure injury, coccyx, stage 2 -wound care -OOB mobility as able -TF, vitamins  Sickle cell trait   Hypernatremia -monitor -FWF increased to 300cc Q4h  Hyperglycemia -monitor, no insulin requirements  Positive cumulative fluid balance -hold additional diuresis  Long-term care need -Benefit from [facility] and long term vent wean    Best Practice (right click and "Reselect all SmartList Selections" daily)   Diet/type: tubefeeds DVT prophylaxis: SCD and lovenox GI prophylaxis: PPI Lines: Central line -PICC Foley:  N/A Purewick Code Status:  full code Last date of multidisciplinary goals of care discussion.  This patient is critically ill with multiple organ system failure; which, requires frequent high complexity decision making, assessment, support, evaluation, and titration of therapies. This was completed through the application of advanced monitoring technologies and extensive interpretation of multiple databases. During this encounter critical care time was devoted to patient care services described in this note for 36 minutes.   Julian Hy, DO 03/31/22 8:10 AM [facility]

## 2022-04-01 DIAGNOSIS — J9601 Acute respiratory failure with hypoxia: Secondary | ICD-10-CM | POA: Diagnosis not present

## 2022-04-01 DIAGNOSIS — Z9911 Dependence on respirator [ventilator] status: Secondary | ICD-10-CM | POA: Diagnosis not present

## 2022-04-01 DIAGNOSIS — Z930 Tracheostomy status: Secondary | ICD-10-CM | POA: Diagnosis not present

## 2022-04-01 LAB — BASIC METABOLIC PANEL
Anion gap: 9 (ref 5–15)
BUN: 38 mg/dL — ABNORMAL HIGH (ref 8–23)
CO2: 34 mmol/L — ABNORMAL HIGH (ref 22–32)
Calcium: 9.2 mg/dL (ref 8.9–10.3)
Chloride: 101 mmol/L (ref 98–111)
Creatinine, Ser: 0.62 mg/dL (ref 0.44–1.00)
GFR, Estimated: >60 mL/min (ref >60)
Glucose, Bld: 100 mg/dL — ABNORMAL HIGH (ref 70–99)
Potassium: 4 mmol/L (ref 3.5–5.1)
Sodium: 144 mmol/L (ref 135–145)

## 2022-04-01 LAB — CBC
HCT: 23.5 % — ABNORMAL LOW (ref 36.0–46.0)
Hemoglobin: 7.1 g/dL — ABNORMAL LOW (ref 12.0–15.0)
MCH: 28.1 pg (ref 26.0–34.0)
MCHC: 30.2 g/dL (ref 30.0–36.0)
MCV: 92.9 fL (ref 80.0–100.0)
Platelets: 315 10*3/uL (ref 150–400)
RBC: 2.53 MIL/uL — ABNORMAL LOW (ref 3.87–5.11)
RDW: 15.4 % (ref 11.5–15.5)
WBC: 6 10*3/uL (ref 4.0–10.5)
nRBC: 0 % (ref 0.0–0.2)

## 2022-04-01 LAB — GLUCOSE, CAPILLARY
Glucose-Capillary: 102 mg/dL — ABNORMAL HIGH (ref 70–99)
Glucose-Capillary: 74 mg/dL (ref 70–99)
Glucose-Capillary: 104 mg/dL — ABNORMAL HIGH (ref 70–99)
Glucose-Capillary: 104 mg/dL — ABNORMAL HIGH (ref 70–99)
Glucose-Capillary: 118 mg/dL — ABNORMAL HIGH (ref 70–99)
Glucose-Capillary: 111 mg/dL — ABNORMAL HIGH (ref 70–99)

## 2022-04-01 NOTE — Plan of Care (Signed)

## 2022-04-01 NOTE — Progress Notes (Signed)
Nutrition Follow-up  DOCUMENTATION CODES:   Severe malnutrition in context of acute illness/injury  INTERVENTION:   Tube Feeds via Cortrak:  Vital AF @ 60 mL/hr (1440 mL/day)  Provides 1728 kcal, 108 gm of protein, and 1166 mL free water daily. Free water flush of 300 mL q 4 hours per MD. This provides 2966 mL of fluid per day  Recommend scheduling bowel regimen until pt has BM given constipation     NUTRITION DIAGNOSIS:   Severe Malnutrition related to acute illness as evidenced by severe muscle depletion, severe fat depletion.  Being addressed via TF  GOAL:   Patient will meet greater than or equal to 90% of their needs  Met  MONITOR:   Vent status, Labs, Weight trends, TF tolerance  REASON FOR ASSESSMENT:   Ventilator, Malnutrition Screening Tool    ASSESSMENT:   Pt with PMH of HTN, orthostatic hypotension, anemia, arthritis, sickle cell trait, and recent weight loss and fatigue x 3 months, abd pain started on protonix now admitted with SOB and possible ascending aortic dissection.  6/13 - intubated 6/16 - extubated in AM; reintubated that evening 6/19 - failed extubated, required reintubation; Cortrak placed 6/20 - Trach placed 6/26 - Bronch  Tolerating Vital AF 1.2 at 60 ml/hr via Cortrak  Hypernatremia improving with free water flushes of 300 mL q 4 hours  Last documented BM on 6/27. No scheduled bowel regimen, prn miralax and colace only  Stage II pressure injury documented; per RN, pressure injury improved  Weight has fluctuated up and down since admission. Current wt 56.1 kg which is higher than admission wt of 49.8 kg. Pt with generalized non-pitting edema so unsure of dry wt  Labs: reviewed Meds: MVI with minerals, thiamine, miralax prn, colace prn   Diet Order:   Diet Order             Diet NPO time specified  Diet effective now                   EDUCATION NEEDS:   Not appropriate for education at this time  Skin:  Skin  Assessment: Skin Integrity Issues: Skin Integrity Issues:: Stage II Stage II: coccyx  Last BM:  6/27  Height:   Ht Readings from Last 1 Encounters:  03/27/22 _0  (1.549 m)    Weight:   Wt Readings from Last 1 Encounters:  04/01/22 56.1 kg     BMI:  Body mass index is 23.37 kg/m.  Estimated Nutritional Needs:   Kcal:  1600-1800  Protein:  80-100 grams  Fluid:  >1.6 L/day    Kerman Passey MS, RDN, LDN, CNSC Registered Dietitian III Clinical Nutrition RD Pager and On-Call Pager Number Located in [place]

## 2022-04-01 NOTE — Progress Notes (Signed)
NAMEIYSHA MISHKIN, MRN:  [id number], DOB:  Jan 17, 1951, LOS: 21 ADMISSION DATE:  03/11/2022, CONSULTATION DATE:  03/11/22 REFERRING MD:  Darl Householder, CHIEF COMPLAINT:  Chest pain, abdominal pain  History of Present Illness:  71 yo woman with chest pain, abdominal pain, SOB. Sudden onset epigastric pain radiating to back since yesterday.  BP elevated always, missed her bp meds this am. CT chest several months ago, mass vs atelectasis.  Here with mid abdominal pain since yesterday.  Low back pain.  SOB, fatigue x 3 months Fatigue and weight loss x 6 months.  Recent abdominal pain, started on protonix   Admitted to ICU, CT abdomen/pelvis negative for AAS, initially on esmolol infusion then required Levophed for hypotension and intubation for encephalopathy. Spot EEG and head CT were negative.  She was extubated 6/14 and transferred out of the ICU.   Overnight on 6/15, pt was ambulating and conversing with family when she suddenly became restless and then altered and had an episode of bradycardia to the 40's.  A code blue was called, but pt never lost pulses, she did require bagging for a short time.  Her HR improved without medications and she was responding to some commands, but had upwardly deviated gaze and flexion of the R arm.  She was transferred to the ICU and re-intubated and loaded with Keppra   Pertinent  Medical History  HTN, Orthostatic hypotension, Anemia, Arthritis, Iritis, Recurrent Sickle cell trait  Echocardiogram 12/10/2021: Left ventricle cavity is normal in size and wall thickness. Normal global wall motion. Normal LV systolic function with EF 61%. Doppler evidence of grade I (impaired) diastolic dysfunction, normal LAP. Mild mitral valve stenosis. Mild tricuspid regurgitation. IVC not seen. No significant change compared to previous study in 2019.  Lexiscan Tetrofosmin stress test 02/01/2022:Normal myocardial perfusion. Stress LVEF 57%.Low risk study.  Significant  Hospital Events: Including procedures, antibiotic start and stop dates in addition to other pertinent events   6/13 admitted to ICU for esmolol gtt with question of possible dissection 6/14 hypotensive, AMS on esmolol 300. Intubated. Starting NE. CT H neg. CTA no AAS. BAL: cytology neg.  Extubate 6/15 6/15-16 night Episode of AMS and bradycardia, concern for sz, re-intubated and loaded with Keppra 6/16 still no sz on EEG. MRI c spine and brain: and without contrast. Mild foraminal narrowing bilaterally at C2-3.Severe left and moderate right foraminal stenosis at C3-4.Moderate foraminal narrowing at C4-5 is worse left than right.  Moderate right and mild left foraminal narrowing at C5-6.Mild left foraminal narrowing at C6-7. No sig change compared w/ MRI 4/11; No acute intracranial abnormality or significant interval change. 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 This likely reflects the sequela of chronic microvascular ischemia. 6/17 failed SBT attempt. PCXR w/ right sided PNA. Cultures sent. Unasyn started.  6/18 continued to spike fever despite addition of Unasyn, switched to [place] 6/19 extubated, had to be reintubated for hypercapnia 6/20: Trached 6/26: Bronchoscopy for pulmonary toilette.  7/1: Trach collar trial 7/2: PS trial , failed again, will need slow wean    Interim History / Subjective:  She denies complaints. This morning did not tolerate 10 PS, but tolerated 14.  Objective   Blood pressure (!) 150/94, pulse 82, temperature 99.9 F (37.7 C), temperature source Oral, resp. rate (!) 21, height _0  (1.549 m), weight 56.1 kg, SpO2 96 %.    Vent Mode: PSV;CPAP FiO2 (%):  [40 %] 40 % Set Rate:  [50  bmp] 18 bmp Vt Set:  [380 mL] 380 mL PEEP:  [5 cmH20] 5 cmH20 Pressure Support:  [14 cmH20] 14 cmH20 Plateau Pressure:  [16 cmH20-24 cmH20] 17 cmH20   Intake/Output Summary (Last 24 hours) at 04/01/2022 1219 Last data filed at  04/01/2022 1000 Gross per 24 hour  Intake 2529.9 ml  Output 1500 ml  Net 1029.9 ml    Filed Weights   03/29/22 0500 03/30/22 0500 04/01/22 0600  Weight: 57.3 kg 54.4 kg 56.1 kg     Physical exam: General:  chronically ill appearing woman sitting up in bed in NAD HEENT: [place]/AT, eyes anicteric Neck: trach in place Neuro: awake, alert, writing to communicate. Moving all extremities.  Chest: rhonchi significantly improved, now only scattered. No wheezing. Breathing comfortably on PS 14 Heart: S1S2, RRR Abdomen: soft, NT Skin: warm, dry, no rashes. No erythema around RUE PICC.  Na+ 144 Bicarb 64 BN 38 Cr 0.62 H/H 7.1/23.5  Resolved problem list:  Pseudomonas pneumonia right lung  Assessment & Plan:  Acute respiratory failure with hypoxia requiring mechanical ventilation Tracheostomy tube dependence Respiratory tract colonized with Pseudomonas -LTVV -con't vent weaning efforts -trach care per protocol -VAP prevention protocol -con't hypertonic saline nebs and CPT  Possible autonomic dysfunction; resolved with medications being discontinued Severe cervical radiculopathy and weakness of the upper extremities Plan: -monitor- avoid previously implicated meds  Acute anemia due to critical illness -Transfuse for Hb <7 pr hemodynamically significant bleeding. Anticipate she will need blood tomorrow. -monitor -check FOBT  Protein-calorie malnutrition, severe Generalized muscle waisting over the course of last several months. May be related to underlying neurologic process? -Con't TF, vitamins -PT, OT, will need SLP- they have been consulted but have not yet seen her. Could consider PMV-in line with vent trials.   Pressure injury, coccyx, stage 2 -con't wound care -optimize nutrition -OOB mobility, frequent turns  Sickle cell trait   Hypernatremia -resolved -con't FWF  Hyperglycemia- essentially resolved -monitor  Long-term care need -Con't efforts at long-term  weaning; is a candidate for LTAC. No barriers to transfer at this point if she gets accepted.  Granddaughter and her husband updated at bedside today.  Best Practice (right click and "Reselect all SmartList Selections" daily)   Diet/type: tubefeeds DVT prophylaxis: SCD and lovenox GI prophylaxis: PPI Lines: Central line -PICC Foley:  N/A Purewick Code Status:  full code Last date of multidisciplinary goals of care discussion.  This patient is critically ill with multiple organ system failure; which, requires frequent high complexity decision making, assessment, support, evaluation, and titration of therapies. This was completed through the application of advanced monitoring technologies and extensive interpretation of multiple databases. During this encounter critical care time was devoted to patient care services described in this note for 35 minutes.   Julian Hy, DO 04/01/22 4:24 PM [facility]

## 2022-04-02 DIAGNOSIS — J9601 Acute respiratory failure with hypoxia: Secondary | ICD-10-CM | POA: Diagnosis not present

## 2022-04-02 DIAGNOSIS — Z9911 Dependence on respirator [ventilator] status: Secondary | ICD-10-CM | POA: Diagnosis not present

## 2022-04-02 LAB — BASIC METABOLIC PANEL
Anion gap: 8 (ref 5–15)
BUN: 33 mg/dL — ABNORMAL HIGH (ref 8–23)
CO2: 30 mmol/L (ref 22–32)
Calcium: 9.2 mg/dL (ref 8.9–10.3)
Chloride: 99 mmol/L (ref 98–111)
Creatinine, Ser: 0.67 mg/dL (ref 0.44–1.00)
GFR, Estimated: >60 mL/min (ref >60)
Glucose, Bld: 113 mg/dL — ABNORMAL HIGH (ref 70–99)
Potassium: 4.1 mmol/L (ref 3.5–5.1)
Sodium: 137 mmol/L (ref 135–145)

## 2022-04-02 LAB — GLUCOSE, CAPILLARY
Glucose-Capillary: 101 mg/dL — ABNORMAL HIGH (ref 70–99)
Glucose-Capillary: 103 mg/dL — ABNORMAL HIGH (ref 70–99)
Glucose-Capillary: 99 mg/dL (ref 70–99)
Glucose-Capillary: 92 mg/dL (ref 70–99)
Glucose-Capillary: 117 mg/dL — ABNORMAL HIGH (ref 70–99)
Glucose-Capillary: 108 mg/dL — ABNORMAL HIGH (ref 70–99)

## 2022-04-02 LAB — CBC
HCT: 25.4 % — ABNORMAL LOW (ref 36.0–46.0)
Hemoglobin: 8.1 g/dL — ABNORMAL LOW (ref 12.0–15.0)
MCH: 28.8 pg (ref 26.0–34.0)
MCHC: 31.9 g/dL (ref 30.0–36.0)
MCV: 90.4 fL (ref 80.0–100.0)
Platelets: 335 10*3/uL (ref 150–400)
RBC: 2.81 MIL/uL — ABNORMAL LOW (ref 3.87–5.11)
RDW: 15.4 % (ref 11.5–15.5)
WBC: 9.8 10*3/uL (ref 4.0–10.5)
nRBC: 0 % (ref 0.0–0.2)

## 2022-04-02 LAB — MAGNESIUM: Magnesium: 1.9 mg/dL (ref 1.7–2.4)

## 2022-04-02 MED ORDER — CLONAZEPAM 0.25 MG PO TBDP
1.0000 mg | ORAL_TABLET | Freq: Two times a day (BID) | ORAL | Status: DC
  Administered 2022-04-02 – 2022-04-05 (×6): 1 mg
  Filled 2022-04-02 (×6): qty 4

## 2022-04-02 MED ORDER — QUETIAPINE FUMARATE 50 MG PO TABS
50.0000 mg | ORAL_TABLET | Freq: Two times a day (BID) | ORAL | Status: DC
  Administered 2022-04-02 – 2022-04-08 (×12): 50 mg
  Filled 2022-04-02 (×12): qty 1

## 2022-04-02 MED ORDER — CLONAZEPAM 0.1 MG/ML ORAL SUSPENSION
1.0000 mg | Freq: Two times a day (BID) | ORAL | Status: DC
  Filled 2022-04-02: qty 10

## 2022-04-02 NOTE — Progress Notes (Signed)
SLP Cancellation Note  Patient Details Name: Sheila Fernandez MRN: [id number] DOB: [birth year]   Cancelled treatment:       Reason Eval/Treat Not Completed: Patient's level of consciousness. Pt up in chair on vent, asleep. Apparently had high anxiety this am and needed medication. RT present, we made a plan to check back in at 1 pm for potential inline PMSV.    Kassady Laboy, Katherene Ponto 04/02/2022, 10:29 AM

## 2022-04-02 NOTE — Progress Notes (Signed)
OT Cancellation Note  Patient Details Name: Sheila Fernandez MRN: [id number] DOB: Mar 16, 1951   Cancelled Treatment:    Reason Eval/Treat Not Completed: Patient's level of consciousness.  Patient sitting up and sleeping soundly.  Difficulty maintaining LOA.  OT to continue efforts.    Cruzita Lipa D Thijs Brunton 04/02/2022, 10:35 AM

## 2022-04-02 NOTE — Progress Notes (Signed)
Speech Language Pathology Treatment:    Patient Details Name: Sheila Fernandez MRN: [id number] DOB: 12/06/1950 Today's Date: 04/02/2022 Time: 1330-1400 SLP Time Calculation (min) (ACUTE ONLY): 30 min  Assessment / Plan / Recommendation Clinical Impression  Pts granddaughter present for inline PMSV session with RT and SLP. Pt up in chair, easily awakened, happy to participate, not anxious with vent changes on PRVC setting. Pt tolerated slow cuff deflation, no significant secretions mobilized. RT dropped PEEP, SLP placed valve and RT increased volume until PIP retruned to 20. Pt participated in conversation with SLP regarding education of anatomy, pt and granddaughter asking appropriate questions about progression with vent weaning, PMSV use and decannulation as well as oral intake. Pt did start to appear a little anxious in this conversation but calmed down after we changed the subject to her family history. Will continue efforts as able.  HPI HPI: 71 y.o. female admitted 03/11/22 with chest pain, abdominal pain, SOB, FTT; admit to ICU with concern for possible aortic dissection. Pt with hypotension and AMS 6/14; ETT 6/14-6/15. CTA 6/14 with no evidence of aortic dissection. 6/15 code blue called due to unresponsiveness/bradycardia, concern for seizure; reintubated; LTM EEG without definitive seizures. 6/19 extubated and reintubated within the hour; trach 6/20.  PMHx:HTN, orthostatic hypotension, sickle cell trait, arthritis.      SLP Plan     Patient needs continued Speech Lanaguage Pathology Services   Recommendations for follow up therapy are one component of a multi-disciplinary discharge planning process, led by the attending physician.  Recommendations may be updated based on patient status, additional functional criteria and insurance authorization.    Recommendations         Patient may use Passy-Muir Speech Valve: with SLP only PMSV Supervision: Full         Follow Up  Recommendations: Acute inpatient rehab (3hours/day) Assistance recommended at discharge: Frequent or constant Supervision/Assistance SLP Visit Diagnosis: Aphonia (R49.1)           Evaan Tidwell, Katherene Ponto  04/02/2022, 2:17 PM

## 2022-04-02 NOTE — Progress Notes (Signed)
PT Cancellation Note  Patient Details Name: Sheila Fernandez MRN: [id number] DOB: May 04, 1951   Cancelled Treatment:    Reason Eval/Treat Not Completed: Medical issues which prohibited therapy. PT attempted x 2 this AM. On first attempt, pt had just transferred to recliner with nursing assist. Very anxious. HR in 120s. Pt reporting difficulty breathing. RN administered meds. On second attempt, pt sleeping soundly. Difficult to arouse. PT to re-attempt as time allows.   Lorriane Shire 04/02/2022, 11:34 AM

## 2022-04-02 NOTE — Progress Notes (Signed)
NAMEHAYLYNN Fernandez, MRN:  [id number], DOB:  May 03, 1951, LOS: 67 ADMISSION DATE:  03/11/2022, CONSULTATION DATE:  03/11/22 REFERRING MD:  Darl Householder, CHIEF COMPLAINT:  Chest pain, abdominal pain  History of Present Illness:  71 yo woman with chest pain, abdominal pain, SOB. Sudden onset epigastric pain radiating to back since yesterday.  BP elevated always, missed her bp meds this am. CT chest several months ago, mass vs atelectasis.  Here with mid abdominal pain since yesterday.  Low back pain.  SOB, fatigue x 3 months Fatigue and weight loss x 6 months.  Recent abdominal pain, started on protonix   Admitted to ICU, CT abdomen/pelvis negative for AAS, initially on esmolol infusion then required Levophed for hypotension and intubation for encephalopathy. Spot EEG and head CT were negative.  She was extubated 6/14 and transferred out of the ICU.   Overnight on 6/15, pt was ambulating and conversing with family when she suddenly became restless and then altered and had an episode of bradycardia to the 40's.  A code blue was called, but pt never lost pulses, she did require bagging for a short time.  Her HR improved without medications and she was responding to some commands, but had upwardly deviated gaze and flexion of the R arm.  She was transferred to the ICU and re-intubated and loaded with Keppra   Pertinent  Medical History  HTN, Orthostatic hypotension, Anemia, Arthritis, Iritis, Recurrent Sickle cell trait  Echocardiogram 12/10/2021: Left ventricle cavity is normal in size and wall thickness. Normal global wall motion. Normal LV systolic function with EF 61%. Doppler evidence of grade I (impaired) diastolic dysfunction, normal LAP. Mild mitral valve stenosis. Mild tricuspid regurgitation. IVC not seen. No significant change compared to previous study in 2019.  Lexiscan Tetrofosmin stress test 02/01/2022:Normal myocardial perfusion. Stress LVEF 57%.Low risk study.  Significant  Hospital Events: Including procedures, antibiotic start and stop dates in addition to other pertinent events   6/13 admitted to ICU for esmolol gtt with question of possible dissection 6/14 hypotensive, AMS on esmolol 300. Intubated. Starting NE. CT H neg. CTA no AAS. BAL: cytology neg.  Extubate 6/15 6/15-16 night Episode of AMS and bradycardia, concern for sz, re-intubated and loaded with Keppra 6/16 still no sz on EEG. MRI c spine and brain: and without contrast. Mild foraminal narrowing bilaterally at C2-3.Severe left and moderate right foraminal stenosis at C3-4.Moderate foraminal narrowing at C4-5 is worse left than right.  Moderate right and mild left foraminal narrowing at C5-6.Mild left foraminal narrowing at C6-7. No sig change compared w/ MRI 4/11; No acute intracranial abnormality or significant interval change. 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 This likely reflects the sequela of chronic microvascular ischemia. 6/17 failed SBT attempt. PCXR w/ right sided PNA. Cultures sent. Unasyn started.  6/18 continued to spike fever despite addition of Unasyn, switched to [place] 6/19 extubated, had to be reintubated for hypercapnia 6/20: Trached 6/26: Bronchoscopy for pulmonary toilette.  7/1: Trach collar trial 7/2: PS trial , failed again, will need slow wean 7/4 3 hours of PSV  7/5 Failed PS trial again   Interim History / Subjective:  Subjective complaints of some SOB  No acute events overnight  Failed PS wean while getting out of chair. 3 hours of PSV on 7/4  1 stool, 2.1 L output   Objective   Blood pressure (!) 176/116, pulse 98, temperature 98.2 F (36.8 C), temperature source Oral, resp. rate [name])  27, height _0  (1.549 m), weight 56.3 kg, SpO2 95 %.    Vent Mode: PRVC FiO2 (%):  [40 %] 40 % Set Rate:  [18 bmp] 18 bmp Vt Set:  [380 mL] 380 mL PEEP:  [5 cmH20] 5 cmH20 Pressure Support:  [14 cmH20] 14  cmH20 Plateau Pressure:  [16 cmH20-26 cmH20] 16 cmH20   Intake/Output Summary (Last 24 hours) at 04/02/2022 0900 Last data filed at 04/02/2022 0700 Gross per 24 hour  Intake 3120 ml  Output 2101 ml  Net 1019 ml   Filed Weights   03/30/22 0500 04/01/22 0600 04/02/22 0500  Weight: 54.4 kg 56.1 kg 56.3 kg     Physical exam: General: sitting in chair, NAD, appears comfortable, chronically ill appearing HEENT: MM pink/moist, anicteric, atraumatic, trach midline Neuro: RASS 0, PERRL 37m, GCS 11t CV: S1S2, NSR, no m/r/g appreciated PULM:  air movement present in all lobes, trachea midline, chest expansion symmetric GI: soft, bsx4 active, non-tender   Extremities: warm/dry, no pretibial edema, capillary refill less than 3 seconds  Skin: PI to coccyx  no rashes or lesions noted  HGB 7.1>8.1 CO2 34>30 BUN 38>33 MG 1.9  Resolved problem list:  Pseudomonas pneumonia right lung  Assessment & Plan:  Acute respiratory failure with hypoxia requiring mechanical ventilation Tracheostomy tube dependence Respiratory tract colonized with Pseudomonas -Continue low tidal volume ventilation -Mobilize as able -Trach care per protocol -Continue weaning efforts -Hypertension protocol -Continue hypertonic saline nebs and CPT  Possible autonomic dysfunction; resolved with medications being discontinued Severe cervical radiculopathy and weakness of the upper extremities Plan: -Monitor -Avoid discontinued medications  Acute anemia due to critical illness Hemoglobin improving -Transfuse PRBC if HBG less than 7 -Obtain AM CBC to trend H&H -Monitor for signs of bleeding  Protein-calorie malnutrition, severe Generalized muscle waisting over the course of last several months. May be related to underlying neurologic process? -Continue tube feeding, vitamins -PT, OT, will need SLP- they have been consulted but have not yet seen her. Could consider PMV-in line with vent trials.   Pressure injury,  coccyx, stage 2 -Wound care per nursing -Optimize nutrition as above -Out of bed as able, frequent turning  Sickle cell trait   Hypernatremia-resolved -Continue free water flushes  Hyperglycemia- essentially resolved Stable -Monitor  Long-term care need -Con't efforts at long-term weaning; is a candidate for LTAC. No barriers to transfer at this point if she gets accepted.   Best Practice (right click and "Reselect all SmartList Selections" daily)   Diet/type: tubefeeds DVT prophylaxis: SCD and lovenox GI prophylaxis: PPI Lines: Central line -PICC Foley:  N/A Purewick Code Status:  full code Last date of multidisciplinary goals of care discussion.  Critical care time: 33 minutes  TRedmond School, MSN, APRN, AGACNP-BC [facility]  04/02/2022 , 9:00 AM  Please see [email or web address] for pager details  If no response, please call [phone] After hours, please call Elink at [phone]

## 2022-04-03 DIAGNOSIS — J9601 Acute respiratory failure with hypoxia: Secondary | ICD-10-CM | POA: Diagnosis not present

## 2022-04-03 DIAGNOSIS — E43 Unspecified severe protein-calorie malnutrition: Secondary | ICD-10-CM | POA: Diagnosis not present

## 2022-04-03 DIAGNOSIS — Z9911 Dependence on respirator [ventilator] status: Secondary | ICD-10-CM | POA: Diagnosis not present

## 2022-04-03 LAB — BASIC METABOLIC PANEL
Anion gap: 4 — ABNORMAL LOW (ref 5–15)
BUN: 36 mg/dL — ABNORMAL HIGH (ref 8–23)
CO2: 31 mmol/L (ref 22–32)
Calcium: 9.3 mg/dL (ref 8.9–10.3)
Chloride: 102 mmol/L (ref 98–111)
Creatinine, Ser: 0.72 mg/dL (ref 0.44–1.00)
GFR, Estimated: >60 mL/min (ref >60)
Glucose, Bld: 104 mg/dL — ABNORMAL HIGH (ref 70–99)
Potassium: 3.9 mmol/L (ref 3.5–5.1)
Sodium: 137 mmol/L (ref 135–145)

## 2022-04-03 LAB — CBC
HCT: 24 % — ABNORMAL LOW (ref 36.0–46.0)
Hemoglobin: 7.6 g/dL — ABNORMAL LOW (ref 12.0–15.0)
MCH: 28.6 pg (ref 26.0–34.0)
MCHC: 31.7 g/dL (ref 30.0–36.0)
MCV: 90.2 fL (ref 80.0–100.0)
Platelets: 311 10*3/uL (ref 150–400)
RBC: 2.66 MIL/uL — ABNORMAL LOW (ref 3.87–5.11)
RDW: 15.5 % (ref 11.5–15.5)
WBC: 7.5 10*3/uL (ref 4.0–10.5)
nRBC: 0 % (ref 0.0–0.2)

## 2022-04-03 LAB — GLUCOSE, CAPILLARY
Glucose-Capillary: 99 mg/dL (ref 70–99)
Glucose-Capillary: 93 mg/dL (ref 70–99)
Glucose-Capillary: 76 mg/dL (ref 70–99)
Glucose-Capillary: 65 mg/dL — ABNORMAL LOW (ref 70–99)
Glucose-Capillary: 81 mg/dL (ref 70–99)
Glucose-Capillary: 77 mg/dL (ref 70–99)

## 2022-04-03 LAB — MAGNESIUM: Magnesium: 2 mg/dL (ref 1.7–2.4)

## 2022-04-03 MED ORDER — POTASSIUM CHLORIDE 20 MEQ PO PACK
20.0000 meq | PACK | Freq: Once | ORAL | Status: AC
Start: 2022-04-03 — End: 2022-04-03
  Administered 2022-04-03: 20 meq
  Filled 2022-04-03: qty 1

## 2022-04-03 NOTE — Progress Notes (Signed)
NAMEDEVONNE Fernandez, MRN:  [id number], DOB:  [birth year], LOS: 23 ADMISSION DATE:  03/11/2022, CONSULTATION DATE:  03/11/22 REFERRING MD:  Darl Householder, CHIEF COMPLAINT:  Chest pain, abdominal pain  History of Present Illness:  71 yo woman with chest pain, abdominal pain, SOB. Sudden onset epigastric pain radiating to back since yesterday.  BP elevated always, missed her bp meds this am. CT chest several months ago, mass vs atelectasis.  Here with mid abdominal pain since yesterday.  Low back pain.  SOB, fatigue x 3 months Fatigue and weight loss x 6 months.  Recent abdominal pain, started on protonix   Admitted to ICU, CT abdomen/pelvis negative for AAS, initially on esmolol infusion then required Levophed for hypotension and intubation for encephalopathy. Spot EEG and head CT were negative.  She was extubated 6/14 and transferred out of the ICU.   Overnight on 6/15, pt was ambulating and conversing with family when she suddenly became restless and then altered and had an episode of bradycardia to the 40's.  A code blue was called, but pt never lost pulses, she did require bagging for a short time.  Her HR improved without medications and she was responding to some commands, but had upwardly deviated gaze and flexion of the R arm.  She was transferred to the ICU and re-intubated and loaded with Keppra   Pertinent  Medical History  HTN, Orthostatic hypotension, Anemia, Arthritis, Iritis, Recurrent Sickle cell trait  Echocardiogram 12/10/2021: Left ventricle cavity is normal in size and wall thickness. Normal global wall motion. Normal LV systolic function with EF 61%. Doppler evidence of grade I (impaired) diastolic dysfunction, normal LAP. Mild mitral valve stenosis. Mild tricuspid regurgitation. IVC not seen. No significant change compared to previous study in 2019.  Lexiscan Tetrofosmin stress test 02/01/2022:Normal myocardial perfusion. Stress LVEF 57%.Low risk study.  Significant  Hospital Events: Including procedures, antibiotic start and stop dates in addition to other pertinent events   6/13 admitted to ICU for esmolol gtt with question of possible dissection 6/14 hypotensive, AMS on esmolol 300. Intubated. Starting NE. CT H neg. CTA no AAS. BAL: cytology neg.  Extubate 6/15 6/15-16 night Episode of AMS and bradycardia, concern for sz, re-intubated and loaded with Keppra 6/16 still no sz on EEG. MRI c spine and brain: and without contrast. Mild foraminal narrowing bilaterally at C2-3.Severe left and moderate right foraminal stenosis at C3-4.Moderate foraminal narrowing at C4-5 is worse left than right.  Moderate right and mild left foraminal narrowing at C5-6.Mild left foraminal narrowing at C6-7. No sig change compared w/ MRI 4/11; No acute intracranial abnormality or significant interval change. 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 This likely reflects the sequela of chronic microvascular ischemia. 6/17 failed SBT attempt. PCXR w/ right sided PNA. Cultures sent. Unasyn started.  6/18 continued to spike fever despite addition of Unasyn, switched to [place] 6/19 extubated, had to be reintubated for hypercapnia 6/20: Trached 6/26: Bronchoscopy for pulmonary toilette.  7/1: Trach collar trial 7/2: PS trial , failed again, will need slow wean 7/4 3 hours of PSV  7/5 Failed PS trial again   Interim History / Subjective:  No acute events overnight  Tmax 99.2   1250 urine output, 1 stool, positive 251 milliliters past 24 hours  Subjective: Denies chest pain, endorses some shortness of breath  Objective   Blood pressure (!) 118/100, pulse 66, temperature 98.7 F (37.1 C), temperature source Oral, resp. rate 18, height 5'  1" (1.549 m), weight 56.7 kg, SpO2 100 %.    Vent Mode: PRVC FiO2 (%):  [40 %] 40 % Set Rate:  [18 bmp] 18 bmp Vt Set:  [380 mL] 380 mL PEEP:  [5 cmH20] 5 cmH20 Plateau Pressure:  [13  cmH20-25 cmH20] 16 cmH20   Intake/Output Summary (Last 24 hours) at 04/03/2022 7915 Last data filed at 04/03/2022 0601 Gross per 24 hour  Intake 1381 ml  Output 1250 ml  Net 131 ml   Filed Weights   04/01/22 0600 04/02/22 0500 04/03/22 0525  Weight: 56.1 kg 56.3 kg 56.7 kg     Physical exam: General: In bed, NAD, appears comfortable HEENT: MM pink/moist, anicteric, atraumatic, trach clean dry and intact Neuro: RASS 0, PERRL 20m, GCS 11t CV: S1S2, NSR, no m/r/g appreciated PULM: Air movement in all lobes, trachea midline, chest expansion symmetric GI: soft, bsx4 active, non-tender   Extremities: warm/dry, no pretibial edema, capillary refill less than 3 seconds  Skin: no rashes or lesions noted  Labs Hemoglobin 8.1 > 7.6 BUN 33 > 36 K3.9, magnesium 2  Resolved problem list:  Pseudomonas pneumonia right lung Hyperglycemia  Assessment & Plan:  Acute respiratory failure with hypoxia requiring mechanical ventilation Tracheostomy tube dependence Respiratory tract colonized with Pseudomonas -Continue low tidal volume ventilation -Mobilize as able -Trach care per protocol -Continue weaning efforts -PAD protocol -Continue CPT as -Continue guaifenesin  Anxiety -Continue Seroquel twice daily -Continue clonazepam twice daily  Possible autonomic dysfunction; resolved with medications being discontinued Severe cervical radiculopathy and weakness of the upper extremities Plan: -Monitor -Avoid discontinue medications  Acute anemia due to critical illness -Transfuse PRBC if HBG less than 7 -Obtain AM CBC to trend H&H -Monitor for signs of bleeding  Protein-calorie malnutrition, severe Generalized muscle waisting over the course of last several months. May be related to underlying neurologic process? -Continue tube feeding -Continue PT/OT/SLP  Pressure injury, coccyx, stage 2 -Wound care per nursing -Optimize nutrition as above -Out of bed as able frequent  turning  Sickle cell trait   Hypernatremia-resolved -Continue free water flushes  Long-term care need -Continue efforts at long-term weaning; is a candidate for LTAC. No barriers to transfer at this point if she gets accepted.   Best Practice (right click and "Reselect all SmartList Selections" daily)   Diet/type: tubefeeds DVT prophylaxis: Lovenox GI prophylaxis: PPI Lines: Central line -PICC Foley:  N/A Purewick Code Status:  full code Last date of multidisciplinary goals of care discussion.  Critical care time: 33 minutes  TRedmond School, MSN, APRN, AGACNP-BC [facility]  04/03/2022 , 9:21 AM  Please see [email or web address] for pager details  If no response, please call [phone] After hours, please call Elink at [phone]

## 2022-04-03 NOTE — Plan of Care (Signed)

## 2022-04-03 NOTE — Progress Notes (Signed)
Patient's CBG was 65 mg/dl.  Patient given juice per [place]. Will recheck CBG.

## 2022-04-03 NOTE — Progress Notes (Signed)
Physical Therapy Treatment Patient Details Name: Sheila Fernandez MRN: [id number] DOB: [birth year] Today's Date: 04/03/2022   History of Present Illness Pt is a 71 y.o. female admitted 03/11/22 with chest pain, abdominal pain, SOB, FTT; admit to ICU with concern for possible aortic dissection. Pt with hypotension and AMS 6/14; ETT 6/14-6/15. CTA 6/14 with no evidence of aortic dissection. 6/15 code blue called due to unresponsiveness/bradycardia, concern for seizure; reintubated; LTM EEG without definitive seizures. 6/19 extubated and reintubated within the hour. S/p trach 6/20. BAL on 6/22 showed rare pseudomonas. S/p bronchoscopy 6/26 for pulmonary toilet. PMH includes HTN, orthostatic hypotension, sickle cell trait, arthritis.    PT Comments    Pt pleasant and very willing to attempt to progress gait on vent. Pt with transition to [place] prior to gait and able to increase distance to total of 300' today with RN and RT present. Pt on CPAP/PS 100% fio2 during gait and returned to 40% FIO2 end of gait with SPO2 98% and HR max 106. Pt with fatigue end of sessio but maintaining volumes on vent. Encouraged OOB throughout day and up to [facility] with nursing staff. Will continue to follow.   HR 96-106 129/93 post gait    Recommendations for follow up therapy are one component of a multi-disciplinary discharge planning process, led by the attending physician.  Recommendations may be updated based on patient status, additional functional criteria and insurance authorization.  Follow Up Recommendations  PT at Long-term acute care hospital (AIR if off vent)     Assistance Recommended at Discharge Frequent or constant Supervision/Assistance  Patient can return home with the following A lot of help with walking and/or transfers;A lot of help with bathing/dressing/bathroom;Assistance with cooking/housework;Assist for transportation;Help with stairs or ramp for entrance   Equipment Recommendations  Rolling  walker (2 wheels);BSC/3in1;Wheelchair (measurements PT);Wheelchair cushion (measurements PT)    Recommendations for Other Services       Precautions / Restrictions Precautions Precautions: Fall;Other (comment) Precaution Comments: trach, cortrak, vent     Mobility  Bed Mobility Overal bed mobility: Needs Assistance Bed Mobility: Supine to Sit     Supine to sit: Min assist, HOB elevated     General bed mobility comments: HOB 30 degrees with min assist to physically rise from surface with cues for hand placement and sequence    Transfers Overall transfer level: Needs assistance   Transfers: Sit to/from Stand Sit to Stand: Min assist, Min guard Stand pivot transfers: Min assist         General transfer comment: min guard to rise from bed, min assist to pivot with HHA from bed to bSC, min assist to rise from chair during gait with cues for hand placement with each trial x 4 total trials    Ambulation/Gait Ambulation/Gait assistance: Min assist, +2 safety/equipment Gait Distance (Feet): 250 Feet Assistive device: Rolling walker (2 wheels) Gait Pattern/deviations: Step-through pattern, Decreased stride length, Trunk flexed   Gait velocity interpretation: <1.8 ft/sec, indicate of risk for recurrent falls   General Gait Details: pt with forward head, rounded shoulders and with tactile and verbal cues throughout for neck extension and scapular retraction pt having difficulty achieving appropriate posture. Pt walked 250' with RW on CPAP/PS at 100% fio2 with SPO2 98%. Seated rest then walked additional 50' pt with progressive LOB and flexion with fatigue requiring increased physical assist with pt unaware of deficits requiring cues to rest and terminate gait. RT and RN present to assist   Stairs  Wheelchair Mobility    Modified Rankin (Stroke Patients Only)       Balance Overall balance assessment: Needs assistance Sitting-balance support: No upper  extremity supported, Feet supported Sitting balance-Leahy Scale: Fair Sitting balance - Comments: unsupported at toilet, supported sitting with fatigue   Standing balance support: During functional activity, Reliant on assistive device for balance, Bilateral upper extremity supported Standing balance-Leahy Scale: Poor Standing balance comment: RW for standing and progressive fatigue requiring physical assist                            Cognition Arousal/Alertness: Awake/alert Behavior During Therapy: WFL for tasks assessed/performed Overall Cognitive Status: Within Functional Limits for tasks assessed                                 General Comments: clearly mouthing words, decreased awareness of deficits with posture and fatigue        Exercises General Exercises - Lower Extremity Long Arc Quad: AROM, Both, Seated, 10 reps Hip Flexion/Marching: AROM, Both, Seated, 10 reps    General Comments        Pertinent Vitals/Pain Pain Assessment Pain Assessment: No/denies pain    Home Living                          Prior Function            PT Goals (current goals can now be found in the care plan section) Progress towards PT goals: Progressing toward goals    Frequency    Min 3X/week      PT Plan Current plan remains appropriate    Co-evaluation              AM-PAC PT "6 Clicks" Mobility   Outcome Measure  Help needed turning from your back to your side while in a flat bed without using bedrails?: A Little Help needed moving from lying on your back to sitting on the side of a flat bed without using bedrails?: A Little Help needed moving to and from a bed to a chair (including a wheelchair)?: A Little Help needed standing up from a chair using your arms (e.g., wheelchair or bedside chair)?: A Little Help needed to walk in hospital room?: Total Help needed climbing 3-5 steps with a railing? : Total 6 Click Score: 14     End of Session Equipment Utilized During Treatment: Other (comment) (vent) Activity Tolerance: Patient tolerated treatment well Patient left: in chair;with call bell/phone within reach;with nursing/sitter in room Nurse Communication: Mobility status PT Visit Diagnosis: Other abnormalities of gait and mobility (R26.89);Difficulty in walking, not elsewhere classified (R26.2);Muscle weakness (generalized) (M62.81)     Time: 4383-8184 PT Time Calculation (min) (ACUTE ONLY): 38 min  Charges:  $Gait Training: 23-37 mins $Therapeutic Activity: 8-22 mins                     Bayard Males, PT Acute Rehabilitation Services Office: [place] 04/03/2022, 12:30 PM

## 2022-04-03 NOTE — Progress Notes (Signed)
Repeat CBG 81 mg/dl

## 2022-04-03 NOTE — Progress Notes (Signed)
Ventilator patient with PT, RT and RN walked around unit without any major complications.

## 2022-04-04 DIAGNOSIS — J9601 Acute respiratory failure with hypoxia: Secondary | ICD-10-CM | POA: Diagnosis not present

## 2022-04-04 LAB — CBC
HCT: 23.5 % — ABNORMAL LOW (ref 36.0–46.0)
Hemoglobin: 7.5 g/dL — ABNORMAL LOW (ref 12.0–15.0)
MCH: 28.5 pg (ref 26.0–34.0)
MCHC: 31.9 g/dL (ref 30.0–36.0)
MCV: 89.4 fL (ref 80.0–100.0)
Platelets: 307 10*3/uL (ref 150–400)
RBC: 2.63 MIL/uL — ABNORMAL LOW (ref 3.87–5.11)
RDW: 15.6 % — ABNORMAL HIGH (ref 11.5–15.5)
WBC: 8.7 10*3/uL (ref 4.0–10.5)
nRBC: 0 % (ref 0.0–0.2)

## 2022-04-04 LAB — BASIC METABOLIC PANEL
Anion gap: 10 (ref 5–15)
BUN: 36 mg/dL — ABNORMAL HIGH (ref 8–23)
CO2: 31 mmol/L (ref 22–32)
Calcium: 9.5 mg/dL (ref 8.9–10.3)
Chloride: 100 mmol/L (ref 98–111)
Creatinine, Ser: 0.62 mg/dL (ref 0.44–1.00)
GFR, Estimated: >60 mL/min (ref >60)
Glucose, Bld: 99 mg/dL (ref 70–99)
Potassium: 4.4 mmol/L (ref 3.5–5.1)
Sodium: 141 mmol/L (ref 135–145)

## 2022-04-04 LAB — GLUCOSE, CAPILLARY
Glucose-Capillary: 102 mg/dL — ABNORMAL HIGH (ref 70–99)
Glucose-Capillary: 111 mg/dL — ABNORMAL HIGH (ref 70–99)
Glucose-Capillary: 112 mg/dL — ABNORMAL HIGH (ref 70–99)
Glucose-Capillary: 85 mg/dL (ref 70–99)
Glucose-Capillary: 87 mg/dL (ref 70–99)
Glucose-Capillary: 92 mg/dL (ref 70–99)
Glucose-Capillary: 97 mg/dL (ref 70–99)

## 2022-04-04 NOTE — Plan of Care (Signed)

## 2022-04-04 NOTE — Progress Notes (Signed)
Occupational Therapy Treatment Patient Details Name: Sheila Fernandez MRN: [id number] DOB: 1951/08/19 Today's Date: 04/04/2022   History of present illness Pt is a 71 y.o. female admitted 03/11/22 with chest pain, abdominal pain, SOB, FTT; admit to ICU with concern for possible aortic dissection. Pt with hypotension and AMS 6/14; ETT 6/14-6/15. CTA 6/14 with no evidence of aortic dissection. 6/15 code blue called due to unresponsiveness/bradycardia, concern for seizure; reintubated; LTM EEG without definitive seizures. 6/19 extubated and reintubated within the hour. S/p trach 6/20. BAL on 6/22 showed rare pseudomonas. S/p bronchoscopy 6/26 for pulmonary toilet. PMH includes HTN, orthostatic hypotension, sickle cell trait, arthritis.   OT comments  OT able to only assist patient back to bed this date, patient very fatigued, and increased difficulty maintaining her LOA.  Min A of two for step pivot transfer with RW.  Up to Mod A for sit to supine.  Slight increase to anxiety.  Remains on full support during transfer.  Plan of care reviewed and goals adjusted as needed.  Continue efforts in the acute setting as appropriate.     Recommendations for follow up therapy are one component of a multi-disciplinary discharge planning process, led by the attending physician.  Recommendations may be updated based on patient status, additional functional criteria and insurance authorization.    Follow Up Recommendations  OT at Long-term acute care hospital    Assistance Recommended at Discharge Frequent or constant Supervision/Assistance  Patient can return home with the following  A lot of help with walking and/or transfers;A lot of help with bathing/dressing/bathroom;Assistance with feeding;Assistance with cooking/housework;Assist for transportation;Direct supervision/assist for financial management;Direct supervision/assist for medications management   Equipment Recommendations       Recommendations for  Other Services      Precautions / Restrictions Precautions Precautions: Fall Precaution Comments: trach, cortrak, vent Restrictions Weight Bearing Restrictions: No       Mobility Bed Mobility Overal bed mobility: Needs Assistance Bed Mobility: Sit to Supine       Sit to supine: Mod assist, Min assist     Patient Response: Cooperative  Transfers Overall transfer level: Needs assistance Equipment used: Rolling walker (2 wheels) Transfers: Sit to/from Stand, Bed to chair/wheelchair/BSC Sit to Stand: Min assist, Mod assist     Step pivot transfers: Min assist     General transfer comment: balance back and increased difficulty stepping backwards.     Balance Overall balance assessment: Needs assistance Sitting-balance support: Feet supported Sitting balance-Leahy Scale: Fair     Standing balance support: Reliant on assistive device for balance Standing balance-Leahy Scale: Poor                                                               Cognition Arousal/Alertness: Lethargic Behavior During Therapy: WFL for tasks assessed/performed Overall Cognitive Status: Within Functional Limits for tasks assessed                                                             Pertinent Vitals/ Pain       Pain Assessment Pain Assessment: No/denies pain Pain  Intervention(s): Monitored during session                                                          Frequency  Min 2X/week        Progress Toward Goals  OT Goals(current goals can now be found in the care plan section)  Progress towards OT goals: Progressing toward goals  Acute Rehab OT Goals OT Goal Formulation: With patient Time For Goal Achievement: 04/18/22 Potential to Achieve Goals: Good ADL Goals Pt Will Perform Grooming: with supervision;standing Pt Will Perform Upper Body Bathing: with set-up;sitting  Plan Discharge plan  remains appropriate    Co-evaluation                 AM-PAC OT "6 Clicks" Daily Activity     Outcome Measure   Help from another person eating meals?: Total Help from another person taking care of personal grooming?: A Little Help from another person toileting, which includes using toliet, bedpan, or urinal?: A Lot Help from another person bathing (including washing, rinsing, drying)?: A Lot Help from another person to put on and taking off regular upper body clothing?: A Little Help from another person to put on and taking off regular lower body clothing?: A Lot 6 Click Score: 13    End of Session Equipment Utilized During Treatment: Rolling walker (2 wheels)  OT Visit Diagnosis: Other abnormalities of gait and mobility (R26.89);Muscle weakness (generalized) (M62.81)   Activity Tolerance Patient limited by fatigue   Patient Left in bed;with call bell/phone within reach   Nurse Communication          Time: 4784-1282 OT Time Calculation (min): 13 min  Charges: OT General Charges $OT Visit: 1 Visit OT Treatments $Self Care/Home Management : 8-22 mins  04/04/2022  RP, OTR/L  Acute Rehabilitation Services  Office:  [phone]   Metta Clines 04/04/2022, 2:15 PM

## 2022-04-04 NOTE — Progress Notes (Signed)
Physical Therapy Treatment Patient Details Name: Sheila Fernandez MRN: [id number] DOB: 1951/03/04 Today's Date: 04/04/2022   History of Present Illness Pt is a 71 y.o. female admitted 03/11/22 with chest pain, abdominal pain, SOB, FTT; admit to ICU with concern for possible aortic dissection. Pt with hypotension and AMS 6/14; ETT 6/14-6/15. CTA 6/14 with no evidence of aortic dissection. 6/15 code blue called due to unresponsiveness/bradycardia, concern for seizure; reintubated; LTM EEG without definitive seizures. 6/19 extubated and reintubated within the hour. S/p trach 6/20. BAL on 6/22 showed rare pseudomonas. S/p bronchoscopy 6/26 for pulmonary toilet. PMH includes HTN, orthostatic hypotension, sickle cell trait, arthritis.    PT Comments    Pt sleeping on arrival, easily awoken but unaware of wet linens with assist to get to [facility] and change linens prior to gait. Pt remains highly motivated and trying to push herself to walk entire unit despite noticeable physical fatigue limiting her and needing cues for safety and to rest. Pt walked on PRVC full support with RT/RN and chair follow. In chair for inline PMSV trial end of session with further activity deferred. Will continue to follow.   SpO2 99% on 40% FiO2 PRVC, peep 5, HR 104    Recommendations for follow up therapy are one component of a multi-disciplinary discharge planning process, led by the attending physician.  Recommendations may be updated based on patient status, additional functional criteria and insurance authorization.  Follow Up Recommendations  PT at Long-term acute care hospital (AIR if off vent)     Assistance Recommended at Discharge Frequent or constant Supervision/Assistance  Patient can return home with the following A lot of help with walking and/or transfers;A lot of help with bathing/dressing/bathroom;Assistance with cooking/housework;Assist for transportation;Help with stairs or ramp for entrance   Equipment  Recommendations  Rolling walker (2 wheels);BSC/3in1;Wheelchair (measurements PT);Wheelchair cushion (measurements PT)    Recommendations for Other Services       Precautions / Restrictions Precautions Precautions: Fall;Other (comment) Precaution Comments: trach, cortrak, vent     Mobility  Bed Mobility Overal bed mobility: Needs Assistance Bed Mobility: Supine to Sit     Supine to sit: Min assist, HOB elevated     General bed mobility comments: HOB 30 degrees with increased assist to physically rise from surface with cues for hand placement and increased time    Transfers Overall transfer level: Needs assistance   Transfers: Sit to/from Stand Sit to Stand: Min assist Stand pivot transfers: Min assist         General transfer comment: min assist to rise from bed and BSC with HHA to pivot from bed to [facility], pt unaware of wet linens. Min to stand from chair during gait    Ambulation/Gait Ambulation/Gait assistance: Mod assist, +2 safety/equipment Gait Distance (Feet): 350 Feet Assistive device: Rolling walker (2 wheels) Gait Pattern/deviations: Step-through pattern, Decreased stride length, Trunk flexed   Gait velocity interpretation: <1.8 ft/sec, indicate of risk for recurrent falls   General Gait Details: pt with forward head, rounded shoulders and posterior lean requiring mod assist to initiate gait with gradual progression to minguard by grossly 100' distance and then regression to min-mod assist with fatigue end of gait. Pt with standing rest x 3 with tactile and verbal cues throughout for neck extension and scapular retraction pt having difficulty achieving appropriate posture. Pt walked 330' then seated rest prior to additional 20' with pt limited by fatigue but highly motivated and continues to attempt to push to walk when fatigue limiting posture and  standing. Close chair follow with RN and RT pt on PRVC with fio2 100% during gait with return to 40% end of  gait   Stairs             Wheelchair Mobility    Modified Rankin (Stroke Patients Only)       Balance Overall balance assessment: Needs assistance Sitting-balance support: Bilateral upper extremity supported, Feet supported Sitting balance-Leahy Scale: Poor Sitting balance - Comments: pt with posterior lean even at [facility] today with min assist for trunk control in sitting   Standing balance support: During functional activity, Reliant on assistive device for balance, Bilateral upper extremity supported Standing balance-Leahy Scale: Poor Standing balance comment: RW for standing and progressive fatigue requiring physical assist                            Cognition Arousal/Alertness: Lethargic Behavior During Therapy: WFL for tasks assessed/performed Overall Cognitive Status: Impaired/Different from baseline Area of Impairment: Safety/judgement                         Safety/Judgement: Decreased awareness of deficits     General Comments: clearly mouthing words, decreased awareness of deficits with posture and fatigue        Exercises      General Comments        Pertinent Vitals/Pain Pain Assessment Pain Assessment: No/denies pain    Home Living                          Prior Function            PT Goals (current goals can now be found in the care plan section) Progress towards PT goals: Progressing toward goals    Frequency    Min 3X/week      PT Plan Current plan remains appropriate    Co-evaluation              AM-PAC PT "6 Clicks" Mobility   Outcome Measure  Help needed turning from your back to your side while in a flat bed without using bedrails?: A Little Help needed moving from lying on your back to sitting on the side of a flat bed without using bedrails?: A Little Help needed moving to and from a bed to a chair (including a wheelchair)?: A Lot Help needed standing up from a chair using your  arms (e.g., wheelchair or bedside chair)?: A Lot Help needed to walk in hospital room?: Total Help needed climbing 3-5 steps with a railing? : Total 6 Click Score: 12    End of Session Equipment Utilized During Treatment: Other (comment) (vent) Activity Tolerance: Patient tolerated treatment well Patient left: in chair;with call bell/phone within reach;with nursing/sitter in room Nurse Communication: Mobility status PT Visit Diagnosis: Other abnormalities of gait and mobility (R26.89);Difficulty in walking, not elsewhere classified (R26.2);Muscle weakness (generalized) (M62.81)     Time: 7169-6789 PT Time Calculation (min) (ACUTE ONLY): 38 min  Charges:  $Gait Training: 23-37 mins $Therapeutic Activity: 8-22 mins                     Bayard Males, PT Acute Rehabilitation Services Office: [place] 04/04/2022, 1:27 PM

## 2022-04-04 NOTE — Progress Notes (Signed)
Ventilator pt walked around unit with RT, PT and RN without any major issues.

## 2022-04-04 NOTE — Progress Notes (Addendum)
NAME:  Sheila Fernandez, MRN:  [id number], DOB:  31-Dec-1950, LOS: 24 ADMISSION DATE:  03/11/2022, CONSULTATION DATE:  03/11/22 REFERRING MD:  Darl Householder, CHIEF COMPLAINT:  Chest pain, abdominal pain  History of Present Illness:  71 yo woman with chest pain, abdominal pain, SOB. Sudden onset epigastric pain radiating to back since yesterday.  BP elevated always, missed her bp meds this am. CT chest several months ago, mass vs atelectasis.  Here with mid abdominal pain since yesterday.  Low back pain.  SOB, fatigue x 3 months Fatigue and weight loss x 6 months.  Recent abdominal pain, started on protonix   Admitted to ICU, CT abdomen/pelvis negative for AAS, initially on esmolol infusion then required Levophed for hypotension and intubation for encephalopathy. Spot EEG and head CT were negative.  She was extubated 6/14 and transferred out of the ICU.   Overnight on 6/15, pt was ambulating and conversing with family when she suddenly became restless and then altered and had an episode of bradycardia to the 40's.  A code blue was called, but pt never lost pulses, she did require bagging for a short time.  Her HR improved without medications and she was responding to some commands, but had upwardly deviated gaze and flexion of the R arm.  She was transferred to the ICU and re-intubated and loaded with Keppra   Pertinent  Medical History  HTN, Orthostatic hypotension, Anemia, Arthritis, Iritis, Recurrent Sickle cell trait  Echocardiogram 12/10/2021: Left ventricle cavity is normal in size and wall thickness. Normal global wall motion. Normal LV systolic function with EF 61%. Doppler evidence of grade I (impaired) diastolic dysfunction, normal LAP. Mild mitral valve stenosis. Mild tricuspid regurgitation. IVC not seen. No significant change compared to previous study in 2019.  Lexiscan Tetrofosmin stress test 02/01/2022:Normal myocardial perfusion. Stress LVEF 57%.Low risk study.  Significant  Hospital Events: Including procedures, antibiotic start and stop dates in addition to other pertinent events   6/13 admitted to ICU for esmolol gtt with question of possible dissection 6/14 hypotensive, AMS on esmolol 300. Intubated. Starting NE. CT H neg. CTA no AAS. BAL: cytology neg.  Extubate 6/15 6/15-16 night Episode of AMS and bradycardia, concern for sz, re-intubated and loaded with Keppra 6/16 still no sz on EEG. MRI c spine and brain: and without contrast. Mild foraminal narrowing bilaterally at C2-3.Severe left and moderate right foraminal stenosis at C3-4.Moderate foraminal narrowing at C4-5 is worse left than right.  Moderate right and mild left foraminal narrowing at C5-6.Mild left foraminal narrowing at C6-7. No sig change compared w/ MRI 4/11; No acute intracranial abnormality or significant interval change. 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 This likely reflects the sequela of chronic microvascular ischemia. 6/17 failed SBT attempt. PCXR w/ right sided PNA. Cultures sent. Unasyn started.  6/18 continued to spike fever despite addition of Unasyn, switched to [place] 6/19 extubated, had to be reintubated for hypercapnia 6/20: Trached 6/26: Bronchoscopy for pulmonary toilette.  7/1: Trach collar trial 7/2: PS trial , failed again, will need slow wean 7/4 3 hours of PSV  7/5 Failed PS trial again 7/6 PSV 12/5, ambulated with PT   Interim History / Subjective:  No events overnight  Objective   Blood pressure (!) 118/100, pulse 66, temperature 98.7 F (37.1 C), temperature source Oral, resp. rate 18, height _0  (1.549 m), weight 56.7 kg, SpO2 100 %.    Vent Mode: PRVC FiO2 (%):  [40 %] 40 %  Set Rate:  [18 bmp] 18 bmp Vt Set:  [380 mL] 380 mL PEEP:  [5 cmH20] 5 cmH20 Pressure Support:  [12 cmH20] 12 cmH20 Plateau Pressure:  [16 cmH20-17 cmH20] 16 cmH20   Intake/Output Summary (Last 24 hours) at 04/04/2022  0820 Last data filed at 04/04/2022 0745 Gross per 24 hour  Intake 1499 ml  Output 1675 ml  Net -176 ml   Filed Weights   04/02/22 0500 04/03/22 0525 04/04/22 0500  Weight: 56.3 kg 56.7 kg 55 kg     Physical exam: General:  Older female lying in bed in NAD HEENT: MM pink/moist, midline cuffed trach, left cortrak Neuro: Awake, oriented, mouths and writes to communicate, MAE CV: rr, NSR, no murmur PULM:  s/p bed CPT, diffuse rhonchi but strong cough and clearing with suction, secretions whitish/ tan.  Doing well on 14/5 today, FiO2 increased to 50% while weaning.  At times gets tachypneic but is able to slow her breathing and take deeper TVs GI: soft, bs+, NT/ ND, purwick Extremities: warm/dry, no LE edema  Skin: no rashes    Labs reviewed, stable  Resolved problem list:  Pseudomonas pneumonia right lung Hyperglycemia  Assessment & Plan:  Acute respiratory failure with hypoxia requiring mechanical ventilation Tracheostomy tube dependence Respiratory tract colonized with Pseudomonas - Continue MV support, 4-8cc/kg IBW with goal Pplat <30 and DP<15  - VAP prevention protocol/ PPI - PAD protocol for sedation> not needed - wean FiO2 as able for SpO2 >92%  - daily PSV trials, goal eventually to ATC.  Ongoing aggressive PT/ mobilization to help with deconditioning - cont prn BD, guaifenesin, CPT   Anxiety - Cont Seroquel 70m BID, clonazepam 166mBID    Possible autonomic dysfunction; resolved with medications being discontinued Severe cervical radiculopathy and weakness of the upper extremities Plan: - Monitor - PT   Acute anemia due to critical illness - stable H/H, trend on CBC - transfuse < 7  Protein-calorie malnutrition, severe Deconditioning  Generalized muscle waisting over the course of last several months. May be related to underlying neurologic process? - Continue tube feeding per cortrak.   - Continue PT/OT/SLP  Pressure injury, coccyx, stage 2 - Wound  care per nursing - Optimize nutrition as above - Out of bed as able frequent turning  Sickle cell trait   Hypernatremia-resolved -Continue free water flushes  Long-term care need - Continue efforts at long-term weaning; is a candidate for LTAC. No barriers to transfer at this point if she gets accepted.    Addendum: awaiting insurance approval for LTAC bed  Best Practice (right click and "Reselect all SmartList Selections" daily)   Diet/type: tubefeeds DVT prophylaxis: Lovenox GI prophylaxis: PPI Lines: Central line -PICC Foley:  N/A Purewick Code Status:  full code Last date of multidisciplinary goals of care discussion. Daughter at bedside updated and all questions answered.    Critical care time: 30 minutes    BrKennieth RadACNP [facility] 04/04/2022, 8:20 AM  See Amion for pager If no response to pager, please call PCCM consult pager After 7:00 pm call Elink

## 2022-04-04 NOTE — Progress Notes (Signed)
Bed CPT held this round. Pt is sleeping.

## 2022-04-04 NOTE — Progress Notes (Signed)
Speech Language Pathology Treatment: Nada Boozer Speaking valve  Patient Details Name: Sheila Fernandez MRN: [id number] DOB: [birth year] Today's Date: 04/04/2022 Time: 1106-1130 SLP Time Calculation (min) (ACUTE ONLY): 24 min  Assessment / Plan / Recommendation Clinical Impression  Pt was seen in conjunction with RT for inline PMV trial on PRVC. She was seated in chair and despite feeling tired after walking with PT, still very motivated and agreeable to participate. She tolerated PMV very well for just over 15 minutes (and likely capable of doing longer). Upon cuff deflation, RT dropped PEEP but did not adjust volume today. Pt reported feeling comfortable with her breathing throughout and VS remained stable. Her voice did seem softer today than what was documented from last session though and question if this could be contributing. She responded well to cues for increased volume to help intelligibility though. Pt will benefit from ongoing PMV trials with SLP.    HPI HPI: 71 y.o. female admitted 03/11/22 with chest pain, abdominal pain, SOB, FTT; admit to ICU with concern for possible aortic dissection. Pt with hypotension and AMS 6/14; ETT 6/14-6/15. CTA 6/14 with no evidence of aortic dissection. 6/15 code blue called due to unresponsiveness/bradycardia, concern for seizure; reintubated; LTM EEG without definitive seizures. 6/19 extubated and reintubated within the hour; trach 6/20.  PMHx:HTN, orthostatic hypotension, sickle cell trait, arthritis.      SLP Plan  Continue with current plan of care      Recommendations for follow up therapy are one component of a multi-disciplinary discharge planning process, led by the attending physician.  Recommendations may be updated based on patient status, additional functional criteria and insurance authorization.    Recommendations         Patient may use Passy-Muir Speech Valve: with SLP only PMSV Supervision: Full         Follow Up  Recommendations: Acute inpatient rehab (3hours/day) Assistance recommended at discharge: Frequent or constant Supervision/Assistance SLP Visit Diagnosis: Aphonia (R49.1) Plan: Continue with current plan of care           Osie Bond., M.A. [place] Office [phone]  Secure chat preferred   04/04/2022, 2:00 PM

## 2022-04-05 ENCOUNTER — Inpatient Hospital Stay (HOSPITAL_COMMUNITY): Payer: Medicare Other

## 2022-04-05 DIAGNOSIS — J9601 Acute respiratory failure with hypoxia: Secondary | ICD-10-CM | POA: Diagnosis not present

## 2022-04-05 LAB — GLUCOSE, CAPILLARY
Glucose-Capillary: 109 mg/dL — ABNORMAL HIGH (ref 70–99)
Glucose-Capillary: 104 mg/dL — ABNORMAL HIGH (ref 70–99)
Glucose-Capillary: 104 mg/dL — ABNORMAL HIGH (ref 70–99)
Glucose-Capillary: 97 mg/dL (ref 70–99)
Glucose-Capillary: 111 mg/dL — ABNORMAL HIGH (ref 70–99)
Glucose-Capillary: 189 mg/dL — ABNORMAL HIGH (ref 70–99)

## 2022-04-05 MED ORDER — CLONAZEPAM 0.25 MG PO TBDP
0.5000 mg | ORAL_TABLET | Freq: Two times a day (BID) | ORAL | Status: DC | PRN
  Administered 2022-04-05 – 2022-04-08 (×3): 0.5 mg
  Filled 2022-04-05 (×4): qty 2

## 2022-04-05 MED ORDER — GUAIFENESIN 100 MG/5ML PO LIQD
10.0000 mL | Freq: Four times a day (QID) | ORAL | Status: DC
Start: 2022-04-05 — End: 2022-04-07
  Administered 2022-04-05 – 2022-04-07 (×8): 10 mL
  Filled 2022-04-05 (×8): qty 10

## 2022-04-05 NOTE — Progress Notes (Addendum)
NAME:  Sheila Fernandez, MRN:  [id number], DOB:  1951/04/20, LOS: 25 ADMISSION DATE:  03/11/2022, CONSULTATION DATE:  03/11/22 REFERRING MD:  Darl Householder, CHIEF COMPLAINT:  Chest pain, abdominal pain  History of Present Illness:  71 yo woman admitted 6/13 with chest pain, abdominal pain, SOB. Sudden onset epigastric pain radiating to back since yesterday with concern of dissection. CT abdomen/pelvis negative for AAS, initially on esmolol infusion then required Levophed for hypotension and intubation for encephalopathy. 6/14 requiring intubation. Spot EEG and head CT were negative.    Extubated 6/15 and transferred out of the ICU. Overnight on 6/15, pt was ambulating and conversing with family when she suddenly became restless and then altered and had an episode of bradycardia to the 40's.  A code blue was called, but pt never lost pulses, she did require bagging for a short time.  Her HR improved without medications and she was responding to some commands, but had upwardly deviated gaze and flexion of the R arm.  She was transferred to the ICU and re-intubated and loaded with Keppra. EEG negative. 6/17 CXR with right side PNA, started on Unasyn however due to continued fevers, changed to Zosyn on 6/18. 6/19 extubated, however shortly after requiring re-intubation for hypercarbia. 6/20 decision made to trach. 6/26 underwent bronchoscopy. Throughout stay with continued failing PS requiring vent support.   Pertinent  Medical History  HTN, Orthostatic hypotension, Anemia, Arthritis, Iritis, Recurrent Sickle cell trait  Echocardiogram 12/10/2021: Left ventricle cavity is normal in size and wall thickness. Normal global wall motion. Normal LV systolic function with EF 61%. Doppler evidence of grade I (impaired) diastolic dysfunction, normal LAP. Mild mitral valve stenosis. Mild tricuspid regurgitation. IVC not seen. No significant change compared to previous study in 2019.  Lexiscan Tetrofosmin stress test  02/01/2022:Normal myocardial perfusion. Stress LVEF 57%.Low risk study.  Significant Hospital Events: Including procedures, antibiotic start and stop dates in addition to other pertinent events   6/13 admitted to ICU for esmolol gtt with question of possible dissection 6/14 hypotensive, AMS on esmolol 300. Intubated. Starting NE. CT H neg. CTA no AAS. BAL: cytology neg.  Extubate 6/15 6/15-16 night Episode of AMS and bradycardia, concern for sz, re-intubated and loaded with Keppra 6/16 still no sz on EEG. MRI c spine and brain: and without contrast. Mild foraminal narrowing bilaterally at C2-3. Severe left and moderate right foraminal stenosis at C3-4. Moderate foraminal narrowing at C4-5 is worse left than right. Moderate right and mild left foraminal narrowing at C5-6.Mild left foraminal narrowing at C6-7. No sig change compared w/ MRI 4/11; No acute intracranial abnormality or significant interval change. 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 This likely reflects the sequela of chronic microvascular ischemia. 6/17 failed SBT attempt. PCXR w/ right sided PNA. Cultures sent. Unasyn started.  6/18 continued to spike fever despite addition of Unasyn, switched to [place] 6/19 extubated, had to be reintubated for hypercapnia 6/20: Trached 6/26: Bronchoscopy for pulmonary toilette.  7/1: Trach collar trial 7/2: PS trial , failed again, will need slow wean 7/4 3 hours of PSV  7/5 Failed PS trial again 7/6 PSV 12/5, ambulated with PT  Interim History / Subjective:  No events overnight. This AM in chair on vent support.   Objective   Blood pressure (!) 118/100, pulse 66, temperature 98.7 F (37.1 C), temperature source Oral, resp. rate 18, height _0  (1.549 m), weight 56.7 kg, SpO2 100 %.    Vent Mode:  PSV;CPAP FiO2 (%):  [40 %-50 %] 40 % Set Rate:  [18 bmp] 18 bmp Vt Set:  [380 mL] 380 mL PEEP:  [5 cmH20] 5 cmH20 Pressure Support:  [18  cmH20] 18 cmH20 Plateau Pressure:  [16 cmH20-20 cmH20] 19 cmH20   Intake/Output Summary (Last 24 hours) at 04/05/2022 1157 Last data filed at 04/05/2022 1000 Gross per 24 hour  Intake 1488 ml  Output 1250 ml  Net 238 ml   Filed Weights   04/03/22 0525 04/04/22 0500 04/05/22 0500  Weight: 56.7 kg 55 kg 54.5 kg     Physical exam: General:  Older adult female, sitting in chair, no distress noted  HEENT: MM pink/moist, midline cuffed trach, left cortrak Neuro: Lethargic, awakens with physical stimulation, follows commands, pupils intact and reactive  CV: RRR, HR 88, no mRG PULM: Coarse breath sounds, no use of accessory muscles  GI: soft, non-tender, non-distended, active bowel sounds  Extremities: warm/dry, -edema  Skin: no rashes    Resolved problem list:  Pseudomonas pneumonia right lung Hyperglycemia Possible autonomic dysfunction; resolved with medications being discontinued Hypernatremia  Assessment & Plan:   Acute respiratory failure with hypoxia requiring mechanical ventilation Tracheostomy tube dependence Respiratory tract colonized with Pseudomonas Plan - Continue Vent support with PS and TC as tolerated  - Routine Trach care  - VAP prevention protocol/PPI - wean FiO2 as able for SpO2 >92%  - Ongoing aggressive PT/ mobilization to help with deconditioning - cont prn BD, CPT  Anxiety Plan - Cont Seroquel 6m BID, clonazepam 175mBID (changed to PRN). If patient still sleepy 7/9 consider decreasing Seroquel, previously was on 50 mg at HS)   Severe cervical radiculopathy and weakness of the upper extremities Plan: - Monitor - PT/OT   Acute anemia due to critical illness Plan - stable H/H, trend on CBC - transfuse < 7  Protein-calorie malnutrition, severe Deconditioning  Generalized muscle waisting over the course of last several months. May be related to underlying neurologic process? Plan - Continue tube feeding per cortrak.   - Continue  PT/OT/SLP  Pressure injury, coccyx, stage 2 - Wound care per nursing - Optimize nutrition as above - Out of bed as able frequent turning  Sickle cell trait   Long-term care need - Continue efforts at long-term weaning; is a candidate for LTAC. No barriers to transfer at this point if she gets accepted.    Addendum: awaiting insurance approval for LTAC bed  Best Practice (right click and "Reselect all SmartList Selections" daily)   Diet/type: tubefeeds. Continues on FW (will follow sodium in AM, maybe able to decrease)  DVT prophylaxis: Lovenox GI prophylaxis: PPI Lines: Central line -PICC Foley:  Purewick Code Status:  full code Last date of multidisciplinary goals of care discussion. Daughter at bedside updated and all questions answered.    Time Spent: 42 minutes  KaHayden PedroAGACNP-BC [facility]  Pgr: [phone] Pgr: [phone]

## 2022-04-06 DIAGNOSIS — J9601 Acute respiratory failure with hypoxia: Secondary | ICD-10-CM | POA: Diagnosis not present

## 2022-04-06 LAB — CBC
HCT: 23.6 % — ABNORMAL LOW (ref 36.0–46.0)
HCT: 23.6 % — ABNORMAL LOW (ref 36.0–46.0)
Hemoglobin: 7.6 g/dL — ABNORMAL LOW (ref 12.0–15.0)
Hemoglobin: 7.7 g/dL — ABNORMAL LOW (ref 12.0–15.0)
MCH: 28.4 pg (ref 26.0–34.0)
MCH: 28.4 pg (ref 26.0–34.0)
MCHC: 32.2 g/dL (ref 30.0–36.0)
MCHC: 32.6 g/dL (ref 30.0–36.0)
MCV: 88.1 fL (ref 80.0–100.0)
MCV: 87.1 fL (ref 80.0–100.0)
Platelets: 285 10*3/uL (ref 150–400)
Platelets: 299 10*3/uL (ref 150–400)
RBC: 2.68 MIL/uL — ABNORMAL LOW (ref 3.87–5.11)
RBC: 2.71 MIL/uL — ABNORMAL LOW (ref 3.87–5.11)
RDW: 15.7 % — ABNORMAL HIGH (ref 11.5–15.5)
RDW: 15.8 % — ABNORMAL HIGH (ref 11.5–15.5)
WBC: 16.4 10*3/uL — ABNORMAL HIGH (ref 4.0–10.5)
WBC: 15.3 10*3/uL — ABNORMAL HIGH (ref 4.0–10.5)
nRBC: 0 % (ref 0.0–0.2)
nRBC: 0 % (ref 0.0–0.2)

## 2022-04-06 LAB — BASIC METABOLIC PANEL
Anion gap: 7 (ref 5–15)
Anion gap: 11 (ref 5–15)
BUN: 32 mg/dL — ABNORMAL HIGH (ref 8–23)
BUN: 36 mg/dL — ABNORMAL HIGH (ref 8–23)
CO2: 29 mmol/L (ref 22–32)
CO2: 29 mmol/L (ref 22–32)
Calcium: 9.1 mg/dL (ref 8.9–10.3)
Calcium: 9.2 mg/dL (ref 8.9–10.3)
Chloride: 99 mmol/L (ref 98–111)
Chloride: 96 mmol/L — ABNORMAL LOW (ref 98–111)
Creatinine, Ser: 0.69 mg/dL (ref 0.44–1.00)
Creatinine, Ser: 0.67 mg/dL (ref 0.44–1.00)
GFR, Estimated: >60 mL/min (ref >60)
GFR, Estimated: >60 mL/min (ref >60)
Glucose, Bld: 150 mg/dL — ABNORMAL HIGH (ref 70–99)
Glucose, Bld: 120 mg/dL — ABNORMAL HIGH (ref 70–99)
Potassium: 3.9 mmol/L (ref 3.5–5.1)
Potassium: 3.9 mmol/L (ref 3.5–5.1)
Sodium: 135 mmol/L (ref 135–145)
Sodium: 136 mmol/L (ref 135–145)

## 2022-04-06 LAB — GLUCOSE, CAPILLARY
Glucose-Capillary: 104 mg/dL — ABNORMAL HIGH (ref 70–99)
Glucose-Capillary: 105 mg/dL — ABNORMAL HIGH (ref 70–99)
Glucose-Capillary: 117 mg/dL — ABNORMAL HIGH (ref 70–99)
Glucose-Capillary: 129 mg/dL — ABNORMAL HIGH (ref 70–99)
Glucose-Capillary: 148 mg/dL — ABNORMAL HIGH (ref 70–99)
Glucose-Capillary: 198 mg/dL — ABNORMAL HIGH (ref 70–99)

## 2022-04-06 NOTE — Progress Notes (Signed)
Attempted to place pt on tach collar and pt did not tolerate. Pt HR increased to 120 and RR increased, anxiety noted. RT suctioned patient, copious amount of secretions. Pt stated "she did not feel right" RT then placed pt back on ventilator and pt calmed down. Vitals signs back to normal range and pt is comfortable on ventilator at this time.

## 2022-04-06 NOTE — Progress Notes (Signed)
NAME:  Sheila Fernandez, MRN:  [id number], DOB:  [birth year], LOS: 26 ADMISSION DATE:  03/11/2022, CONSULTATION DATE:  03/11/22 REFERRING MD:  Darl Householder, CHIEF COMPLAINT:  Chest pain, abdominal pain  History of Present Illness:  71 yo woman admitted 6/13 with chest pain, abdominal pain, SOB. Sudden onset epigastric pain radiating to back since yesterday with concern of dissection. CT abdomen/pelvis negative for AAS, initially on esmolol infusion then required Levophed for hypotension and intubation for encephalopathy. 6/14 requiring intubation. Spot EEG and head CT were negative.    Extubated 6/15 and transferred out of the ICU. Overnight on 6/15, pt was ambulating and conversing with family when she suddenly became restless and then altered and had an episode of bradycardia to the 40's.  A code blue was called, but pt never lost pulses, she did require bagging for a short time.  Her HR improved without medications and she was responding to some commands, but had upwardly deviated gaze and flexion of the R arm.  She was transferred to the ICU and re-intubated and loaded with Keppra. EEG negative. 6/17 CXR with right side PNA, started on Unasyn however due to continued fevers, changed to Zosyn on 6/18. 6/19 extubated, however shortly after requiring re-intubation for hypercarbia. 6/20 decision made to trach. 6/26 underwent bronchoscopy. Throughout stay with continued failing PS requiring vent support.   Pertinent  Medical History  HTN, Orthostatic hypotension, Anemia, Arthritis, Iritis, Recurrent Sickle cell trait  Echocardiogram 12/10/2021: Left ventricle cavity is normal in size and wall thickness. Normal global wall motion. Normal LV systolic function with EF 61%. Doppler evidence of grade I (impaired) diastolic dysfunction, normal LAP. Mild mitral valve stenosis. Mild tricuspid regurgitation. IVC not seen. No significant change compared to previous study in 2019.  Lexiscan Tetrofosmin stress test  02/01/2022:Normal myocardial perfusion. Stress LVEF 57%.Low risk study.  Significant Hospital Events: Including procedures, antibiotic start and stop dates in addition to other pertinent events   6/13 admitted to ICU for esmolol gtt with question of possible dissection 6/14 hypotensive, AMS on esmolol 300. Intubated. Starting NE. CT H neg. CTA no AAS. BAL: cytology neg.  Extubate 6/15 6/15-16 night Episode of AMS and bradycardia, concern for sz, re-intubated and loaded with Keppra 6/16 still no sz on EEG. MRI c spine and brain: and without contrast. Mild foraminal narrowing bilaterally at C2-3. Severe left and moderate right foraminal stenosis at C3-4. Moderate foraminal narrowing at C4-5 is worse left than right. Moderate right and mild left foraminal narrowing at C5-6.Mild left foraminal narrowing at C6-7. No sig change compared w/ MRI 4/11; No acute intracranial abnormality or significant interval change. 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 This likely reflects the sequela of chronic microvascular ischemia. 6/17 failed SBT attempt. PCXR w/ right sided PNA. Cultures sent. Unasyn started.  6/18 continued to spike fever despite addition of Unasyn, switched to [place] 6/19 extubated, had to be reintubated for hypercapnia 6/20: Trached 6/26: Bronchoscopy for pulmonary toilette.  7/1: Trach collar trial 7/2: PS trial , failed again, will need slow wean 7/4 3 hours of PSV  7/5 Failed PS trial again 7/6 PSV 12/5, ambulated with PT  Interim History / Subjective:  No events overnight. This AM in chair on vent support.   Objective   Blood pressure (!) 118/100, pulse 66, temperature 98.7 F (37.1 C), temperature source Oral, resp. rate 18, height _0  (1.549 m), weight 56.7 kg, SpO2 100 %.    Vent Mode:  PSV;CPAP FiO2 (%):  [40 %] 40 % Set Rate:  [18 bmp] 18 bmp Vt Set:  [380 mL] 380 mL PEEP:  [5 cmH20] 5 cmH20 Pressure Support:  [10  DDU20-25 cmH20] 10 cmH20 Plateau Pressure:  [13 cmH20-18 cmH20] 18 cmH20   Intake/Output Summary (Last 24 hours) at 04/06/2022 1208 Last data filed at 04/06/2022 0800 Gross per 24 hour  Intake 2220 ml  Output 700 ml  Net 1520 ml    Filed Weights   04/03/22 0525 04/04/22 0500 04/05/22 0500  Weight: 56.7 kg 55 kg 54.5 kg     Physical exam: General:  Older adult female, sitting in chair, no distress noted  HEENT: MM pink/moist, midline cuffed trach, left cortrak Neuro: Lethargic, awakens with physical stimulation, follows commands, pupils intact and reactive  CV: RRR, HR 88, no mRG PULM: Coarse breath sounds, no use of accessory muscles  GI: soft, non-tender, non-distended, active bowel sounds  Extremities: warm/dry, -edema  Skin: no rashes    Resolved problem list:  Pseudomonas pneumonia right lung Hyperglycemia Possible autonomic dysfunction; resolved with medications being discontinued Hypernatremia  Assessment & Plan:   Acute respiratory failure with hypoxia requiring mechanical ventilation Tracheostomy tube dependence Respiratory tract colonized with Pseudomonas Plan - Continue Vent support with PS and TC as tolerated  - Routine Trach care  - VAP prevention protocol/PPI - wean FiO2 as able for SpO2 >92%  - Ongoing aggressive PT/ mobilization to help with deconditioning - cont prn BD, CPT  Anxiety Plan - Cont Seroquel 78m BID, clonazepam 159mBID (changed to PRN). If patient still sleepy 7/9 consider decreasing Seroquel, previously was on 50 mg at HS)   Severe cervical radiculopathy and weakness of the upper extremities Plan: - Monitor - PT/OT   Acute anemia due to critical illness Plan - stable H/H, trend on CBC - transfuse < 7  Protein-calorie malnutrition, severe Deconditioning  Generalized muscle waisting over the course of last several months. May be related to underlying neurologic process? Plan - Continue tube feeding per cortrak.   - Continue  PT/OT/SLP  Pressure injury, coccyx, stage 2 - Wound care per nursing - Optimize nutrition as above - Out of bed as able frequent turning  Sickle cell trait   Long-term care need - Continue efforts at long-term weaning; is a candidate for LTAC. No barriers to transfer at this point if she gets accepted.    Addendum: awaiting insurance approval for LTAC bed  Best Practice (right click and "Reselect all SmartList Selections" daily)   Diet/type: tubefeeds. Continues on FW (will follow sodium in AM, maybe able to decrease)  DVT prophylaxis: Lovenox GI prophylaxis: PPI Lines: Central line -PICC Foley:  Purewick Code Status:  full code Last date of multidisciplinary goals of care discussion. Daughter at bedside updated and all questions answered.    RaKipp BroodMD FREndoscopy Center Of Coastal Georgia LLCCU Physician CHBenton RidgePager: [phone] Epic Secure Chat After hours: [phone].  04/06/2022, 12:08 PM

## 2022-04-07 ENCOUNTER — Inpatient Hospital Stay (HOSPITAL_COMMUNITY): Payer: Medicare Other

## 2022-04-07 LAB — CBC
HCT: 20.3 % — ABNORMAL LOW (ref 36.0–46.0)
HCT: 22.6 % — ABNORMAL LOW (ref 36.0–46.0)
Hemoglobin: 6.6 g/dL — CL (ref 12.0–15.0)
Hemoglobin: 7.2 g/dL — ABNORMAL LOW (ref 12.0–15.0)
MCH: 28.8 pg (ref 26.0–34.0)
MCH: 28.6 pg (ref 26.0–34.0)
MCHC: 32.5 g/dL (ref 30.0–36.0)
MCHC: 31.9 g/dL (ref 30.0–36.0)
MCV: 88.6 fL (ref 80.0–100.0)
MCV: 89.7 fL (ref 80.0–100.0)
Platelets: 260 10*3/uL (ref 150–400)
Platelets: 291 10*3/uL (ref 150–400)
RBC: 2.29 MIL/uL — ABNORMAL LOW (ref 3.87–5.11)
RBC: 2.52 MIL/uL — ABNORMAL LOW (ref 3.87–5.11)
RDW: 15.9 % — ABNORMAL HIGH (ref 11.5–15.5)
RDW: 15.9 % — ABNORMAL HIGH (ref 11.5–15.5)
WBC: 12 10*3/uL — ABNORMAL HIGH (ref 4.0–10.5)
WBC: 13.2 10*3/uL — ABNORMAL HIGH (ref 4.0–10.5)
nRBC: 0 % (ref 0.0–0.2)
nRBC: 0 % (ref 0.0–0.2)

## 2022-04-07 LAB — GLUCOSE, CAPILLARY
Glucose-Capillary: 110 mg/dL — ABNORMAL HIGH (ref 70–99)
Glucose-Capillary: 106 mg/dL — ABNORMAL HIGH (ref 70–99)
Glucose-Capillary: 122 mg/dL — ABNORMAL HIGH (ref 70–99)
Glucose-Capillary: 109 mg/dL — ABNORMAL HIGH (ref 70–99)
Glucose-Capillary: 96 mg/dL (ref 70–99)
Glucose-Capillary: 109 mg/dL — ABNORMAL HIGH (ref 70–99)

## 2022-04-07 LAB — BASIC METABOLIC PANEL
Anion gap: 6 (ref 5–15)
BUN: 39 mg/dL — ABNORMAL HIGH (ref 8–23)
CO2: 30 mmol/L (ref 22–32)
Calcium: 9.1 mg/dL (ref 8.9–10.3)
Chloride: 100 mmol/L (ref 98–111)
Creatinine, Ser: 0.73 mg/dL (ref 0.44–1.00)
GFR, Estimated: >60 mL/min (ref >60)
Glucose, Bld: 121 mg/dL — ABNORMAL HIGH (ref 70–99)
Potassium: 4 mmol/L (ref 3.5–5.1)
Sodium: 136 mmol/L (ref 135–145)

## 2022-04-07 MED ORDER — TOBRAMYCIN 300 MG/5ML IN NEBU
300.0000 mg | INHALATION_SOLUTION | Freq: Two times a day (BID) | RESPIRATORY_TRACT | Status: DC
  Administered 2022-04-07 – 2022-04-08 (×3): 300 mg via RESPIRATORY_TRACT
  Filled 2022-04-07 (×4): qty 5

## 2022-04-07 MED ORDER — FUROSEMIDE 10 MG/ML IJ SOLN
40.0000 mg | Freq: Once | INTRAMUSCULAR | Status: AC
  Administered 2022-04-07: 40 mg via INTRAVENOUS
  Filled 2022-04-07: qty 4

## 2022-04-07 MED ORDER — GUAIFENESIN 100 MG/5ML PO LIQD
15.0000 mL | Freq: Four times a day (QID) | ORAL | Status: DC
  Administered 2022-04-07 – 2022-04-08 (×5): 15 mL
  Filled 2022-04-07 (×5): qty 15

## 2022-04-07 NOTE — TOC Progression Note (Addendum)
Transition of Care [facility]) - Progression Note    Patient Details  Name: [name] MRN: [id number] Date of Birth: [birth year]  Transition of Care [facility]) CM/SW Contact  Graves-Bigelow, Ocie Cornfield, RN Phone Number: 04/07/2022, 2:18 PM  Clinical Narrative:  Case Manager just received notification from the [employer]iaison  that a Peer to Peer has been offered for this patient. MD is aware to call [phone] option 5. This offer expires @ 9:00 am on 04-08-22. Case Manager will continue to follow for additional transition of care needs.   04-07-22 1537 MD states P2P has been approved for LTAC- Awaiting bed availability.   Expected Discharge Plan: Long Term Acute Care (LTAC) Barriers to Discharge: Continued Medical Work up  Expected Discharge Plan and Services Expected Discharge Plan: Long Term Acute Care (LTAC) In-house Referral: NA Discharge Planning Services: CM Consult Post Acute Care Choice: Long Term Acute Care (LTAC) Living arrangements for the past 2 months: Apartment                   DME Agency: NA     Readmission Risk Interventions     No data to display

## 2022-04-07 NOTE — Progress Notes (Signed)
eLink Physician-Brief Progress Note Patient Name: Sheila Fernandez DOB: May 11, 1951 MRN: [id number]   Date of Service  04/07/2022  HPI/Events of Note  Informed of anemia with drop in hemoglobin from 7.7 --> 6.6 with no signs of active bleeding.   eICU Interventions  Recheck CBC.        Intervention Category Intermediate Interventions: Other:  Elsie Lincoln 04/07/2022, 3:25 AM

## 2022-04-07 NOTE — Progress Notes (Signed)
NAME:  Sheila Fernandez, MRN:  [id number], DOB:  1951/05/26, LOS: 49 ADMISSION DATE:  03/11/2022, CONSULTATION DATE:  03/11/22 REFERRING MD:  Darl Householder, CHIEF COMPLAINT:  Chest pain, abdominal pain  History of Present Illness:  71 yo woman admitted 6/13 with chest pain, abdominal pain, SOB. Sudden onset epigastric pain radiating to back since yesterday with concern of dissection. CT abdomen/pelvis negative for AAS, initially on esmolol infusion then required Levophed for hypotension and intubation for encephalopathy. 6/14 requiring intubation. Spot EEG and head CT were negative.    Extubated 6/15 and transferred out of the ICU. Overnight on 6/15, pt was ambulating and conversing with family when she suddenly became restless and then altered and had an episode of bradycardia to the 40's.  A code blue was called, but pt never lost pulses, she did require bagging for a short time.  Her HR improved without medications and she was responding to some commands, but had upwardly deviated gaze and flexion of the R arm.  She was transferred to the ICU and re-intubated and loaded with Keppra. EEG negative. 6/17 CXR with right side PNA, started on Unasyn however due to continued fevers, changed to Zosyn on 6/18. 6/19 extubated, however shortly after requiring re-intubation for hypercarbia. 6/20 decision made to trach. 6/26 underwent bronchoscopy. Throughout stay with continued failing PS requiring vent support.   Pertinent  Medical History  HTN, Orthostatic hypotension, Anemia, Arthritis, Iritis, Recurrent Sickle cell trait  Echocardiogram 12/10/2021: Left ventricle cavity is normal in size and wall thickness. Normal global wall motion. Normal LV systolic function with EF 61%. Doppler evidence of grade I (impaired) diastolic dysfunction, normal LAP. Mild mitral valve stenosis. Mild tricuspid regurgitation. IVC not seen. No significant change compared to previous study in 2019.  Lexiscan Tetrofosmin stress test  02/01/2022:Normal myocardial perfusion. Stress LVEF 57%.Low risk study.  Significant Hospital Events: Including procedures, antibiotic start and stop dates in addition to other pertinent events   6/13 admitted to ICU for esmolol gtt with question of possible dissection 6/14 hypotensive, AMS on esmolol 300. Intubated. Starting NE. CT H neg. CTA no AAS. BAL: cytology neg.  Extubate 6/15 6/15-16 night Episode of AMS and bradycardia, concern for sz, re-intubated and loaded with Keppra 6/16 still no sz on EEG. MRI c spine and brain: and without contrast. Mild foraminal narrowing bilaterally at C2-3. Severe left and moderate right foraminal stenosis at C3-4. Moderate foraminal narrowing at C4-5 is worse left than right. Moderate right and mild left foraminal narrowing at C5-6.Mild left foraminal narrowing at C6-7. No sig change compared w/ MRI 4/11; No acute intracranial abnormality or significant interval change. 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 This likely reflects the sequela of chronic microvascular ischemia. 6/17 failed SBT attempt. PCXR w/ right sided PNA. Cultures sent. Unasyn started.  6/18 continued to spike fever despite addition of Unasyn, switched to [place] 6/19 extubated, had to be reintubated for hypercapnia 6/20: Trached 6/26: Bronchoscopy for pulmonary toilette.  7/1: Trach collar trial 7/2: PS trial , failed again, will need slow wean 7/4 3 hours of PSV  7/5 Failed PS trial again 7/6 PSV 12/5, ambulated with PT 7/10 Failed weaning trial, thick secretions   Interim History / Subjective:  No events overnight.  Thick white secretions , Failed CPAP trial, No CXR since 7/8 at that time enlarging right pleural effusion and progressive right basilar collapse/consolidation T Max 98.9  Objective   Blood pressure (!) 118/100, pulse 66, temperature  98.7 F (37.1 C), temperature source Oral, resp. rate 18, height _0  (1.549 m),  weight 56.7 kg, SpO2 100 %.    Vent Mode: PRVC FiO2 (%):  [40 %] 40 % Set Rate:  [18 bmp] 18 bmp Vt Set:  [380 mL] 380 mL PEEP:  [5 cmH20] 5 cmH20 Pressure Support:  [5 cmH20-12 cmH20] 12 cmH20 Plateau Pressure:  [17 cmH20-19 cmH20] 17 cmH20   Intake/Output Summary (Last 24 hours) at 04/07/2022 0947 Last data filed at 04/07/2022 0600 Gross per 24 hour  Intake 720 ml  Output 400 ml  Net 320 ml   Filed Weights   04/03/22 0525 04/04/22 0500 04/05/22 0500  Weight: 56.7 kg 55 kg 54.5 kg     Physical exam: General:  Older adult female, in bed,  no distress noted  HEENT: MM pink/moist, midline cuffed trach, left cortrak, No LAD, Trace JVD Neuro: Awake, interactive,  follows commands, pupils intact and reactive  CV: RRR, HR 89, no mRG PULM: Coarse breath sounds, Rhonchi, no use of accessory muscles diminished per bases R>L, few crackles per bases  GI: soft, non-tender, non-distended, active bowel sounds , Body mass index is 22.7 kg/m.  Extremities: warm/dry, -edema , no obvious deformities Skin: no rashes , no lesions, warm dry and intact  Labs Reviewed WBC 13.2/ HGB 7.2/Platelets 291 Na 136/ K 4.0/ Creatinine 0.73/ Calcium 9.1/ BUN 39  Net + 16.9  liters  400 cc Urine output   Resolved problem list:  Pseudomonas pneumonia right lung Hyperglycemia Possible autonomic dysfunction; resolved with medications being discontinued Hypernatremia  Assessment & Plan:   Acute respiratory failure with hypoxia requiring mechanical ventilation Tracheostomy tube dependence Respiratory tract colonized with Pseudomonas Right sided effusion per last CXR.  Plan - Continue Vent support with PS and TC as tolerated  - Routine Trach care  - VAP prevention protocol/PPI - wean FiO2 as able for SpO2 >92%  - Ongoing aggressive PT/ mobilization to help with deconditioning - cont prn BD, CPT - CXR now and prn to follow effusion  - Will give Lasix 40 IV now, consider daily  Anxiety Plan -  Cont Seroquel 56m BID, clonazepam 161mBID (changed to PRN). If patient still sleepy 7/9 consider decreasing Seroquel, previously was on 50 mg at HS)   Severe cervical radiculopathy and weakness of the upper extremities Plan: - Monitor - PT/OT   Acute anemia due to critical illness Plan - stable H/H, trend on CBC - transfuse < 7  Protein-calorie malnutrition, severe Deconditioning  Generalized muscle waisting over the course of last several months. May be related to underlying neurologic process? Plan - Continue tube feeding per cortrak.   - Continue PT/OT/SLP  Pressure injury, coccyx, stage 2 - Wound care per nursing - Optimize nutrition as above - Out of bed as able frequent turning  Sickle cell trait   Long-term care need - Continue efforts at long-term weaning; is a candidate for LTAC. No barriers to transfer at this point if she gets accepted.    Addendum: awaiting insurance approval for LTAC bed  Best Practice (right click and "Reselect all SmartList Selections" daily)   Diet/type: tubefeeds. Continues on FW (will follow sodium in AM, maybe able to decrease)  DVT prophylaxis: Lovenox GI prophylaxis: PPI Lines: Central line -PICC Foley:  Purewick Code Status:  full code Last date of multidisciplinary goals of care discussion. No family at bedside this morning  CC APP time 35 minutes  SaMagdalen SpatzMSN, AGACNP-BC [facility] See Amion for personal pager PCCM on call pager [phone]  04/07/2022, 9:47 AM

## 2022-04-08 ENCOUNTER — Institutional Professional Consult (permissible substitution)
Admit: 2022-04-08 | Discharge: 2022-05-24 | Disposition: A | Discharge status: Other Acute Inpatient Hospital | Payer: Medicare Other

## 2022-04-08 ENCOUNTER — Other Ambulatory Visit (HOSPITAL_COMMUNITY): Payer: Medicare Other

## 2022-04-08 ENCOUNTER — Inpatient Hospital Stay (HOSPITAL_COMMUNITY): Payer: Medicare Other

## 2022-04-08 ENCOUNTER — Institutional Professional Consult (permissible substitution) (HOSPITAL_COMMUNITY): Payer: Medicare Other

## 2022-04-08 DIAGNOSIS — Z930 Tracheostomy status: Secondary | ICD-10-CM

## 2022-04-08 DIAGNOSIS — J90 Pleural effusion, not elsewhere classified: Secondary | ICD-10-CM

## 2022-04-08 DIAGNOSIS — J9621 Acute and chronic respiratory failure with hypoxia: Secondary | ICD-10-CM

## 2022-04-08 DIAGNOSIS — J189 Pneumonia, unspecified organism: Secondary | ICD-10-CM

## 2022-04-08 DIAGNOSIS — D573 Sickle-cell trait: Secondary | ICD-10-CM | POA: Diagnosis present

## 2022-04-08 LAB — BASIC METABOLIC PANEL
Anion gap: 8 (ref 5–15)
BUN: 35 mg/dL — ABNORMAL HIGH (ref 8–23)
CO2: 33 mmol/L — ABNORMAL HIGH (ref 22–32)
Calcium: 9.3 mg/dL (ref 8.9–10.3)
Chloride: 102 mmol/L (ref 98–111)
Creatinine, Ser: 0.7 mg/dL (ref 0.44–1.00)
GFR, Estimated: >60 mL/min (ref >60)
Glucose, Bld: 119 mg/dL — ABNORMAL HIGH (ref 70–99)
Potassium: 3.9 mmol/L (ref 3.5–5.1)
Sodium: 143 mmol/L (ref 135–145)

## 2022-04-08 LAB — BRAIN NATRIURETIC PEPTIDE: B Natriuretic Peptide: 51.6 pg/mL (ref 0.0–100.0)

## 2022-04-08 LAB — GLUCOSE, CAPILLARY
Glucose-Capillary: 115 mg/dL — ABNORMAL HIGH (ref 70–99)
Glucose-Capillary: 99 mg/dL (ref 70–99)
Glucose-Capillary: 148 mg/dL — ABNORMAL HIGH (ref 70–99)

## 2022-04-08 LAB — CBC WITH DIFFERENTIAL/PLATELET
Abs Immature Granulocytes: 0.04 10*3/uL (ref 0.00–0.07)
Basophils Absolute: 0.1 10*3/uL (ref 0.0–0.1)
Basophils Relative: 1 %
Eosinophils Absolute: 0.3 10*3/uL (ref 0.0–0.5)
Eosinophils Relative: 3 %
HCT: 22 % — ABNORMAL LOW (ref 36.0–46.0)
Hemoglobin: 7 g/dL — ABNORMAL LOW (ref 12.0–15.0)
Immature Granulocytes: 0 %
Lymphocytes Relative: 18 %
Lymphs Abs: 1.7 10*3/uL (ref 0.7–4.0)
MCH: 28.7 pg (ref 26.0–34.0)
MCHC: 31.8 g/dL (ref 30.0–36.0)
MCV: 90.2 fL (ref 80.0–100.0)
Monocytes Absolute: 0.6 10*3/uL (ref 0.1–1.0)
Monocytes Relative: 7 %
Neutro Abs: 6.8 10*3/uL (ref 1.7–7.7)
Neutrophils Relative %: 71 %
Platelets: 271 10*3/uL (ref 150–400)
RBC: 2.44 MIL/uL — ABNORMAL LOW (ref 3.87–5.11)
RDW: 16 % — ABNORMAL HIGH (ref 11.5–15.5)
WBC: 9.6 10*3/uL (ref 4.0–10.5)
nRBC: 0 % (ref 0.0–0.2)

## 2022-04-08 MED ORDER — DIATRIZOATE MEGLUMINE & SODIUM 66-10 % PO SOLN
ORAL | Status: AC
  Filled 2022-04-08: qty 30

## 2022-04-08 MED ORDER — POLYETHYLENE GLYCOL 3350 17 G PO PACK
17.0000 g | PACK | Freq: Every day | ORAL | 0 refills | Status: DC | PRN
Start: 2022-04-08 — End: 2022-05-26

## 2022-04-08 MED ORDER — DOCUSATE SODIUM 50 MG/5ML PO LIQD
100.0000 mg | Freq: Two times a day (BID) | ORAL | 0 refills | Status: DC | PRN
Start: 2022-04-08 — End: 2022-05-26

## 2022-04-08 MED ORDER — ALBUTEROL SULFATE (2.5 MG/3ML) 0.083% IN NEBU: 2.5000 mg | INHALATION_SOLUTION | RESPIRATORY_TRACT | 12 refills | Status: DC | PRN

## 2022-04-08 MED ORDER — QUETIAPINE FUMARATE 50 MG PO TABS: 50.0000 mg | ORAL_TABLET | Freq: Two times a day (BID) | ORAL | 0 refills | Status: DC

## 2022-04-08 MED ORDER — FUROSEMIDE 10 MG/ML IJ SOLN
40.0000 mg | Freq: Once | INTRAMUSCULAR | Status: AC
  Administered 2022-04-08: 40 mg via INTRAVENOUS
  Filled 2022-04-08: qty 4

## 2022-04-08 MED ORDER — ONDANSETRON HCL 4 MG/2ML IJ SOLN: 4.0000 mg | Freq: Four times a day (QID) | INTRAMUSCULAR | 0 refills | Status: DC | PRN

## 2022-04-08 MED ORDER — GUAIFENESIN 100 MG/5ML PO LIQD: 15.0000 mL | Freq: Four times a day (QID) | ORAL | 0 refills | Status: DC

## 2022-04-08 MED ORDER — LIP MEDEX EX OINT: TOPICAL_OINTMENT | CUTANEOUS | 0 refills | Status: DC | PRN

## 2022-04-08 MED ORDER — THIAMINE HCL 100 MG PO TABS: 100.0000 mg | ORAL_TABLET | Freq: Every day | ORAL | 0 refills | Status: DC

## 2022-04-08 MED ORDER — TOBRAMYCIN 300 MG/5ML IN NEBU: 300.0000 mg | INHALATION_SOLUTION | Freq: Two times a day (BID) | RESPIRATORY_TRACT | 0 refills | Status: DC

## 2022-04-08 MED ORDER — ADULT MULTIVITAMIN W/MINERALS CH: 1.0000 | ORAL_TABLET | Freq: Every day | ORAL | 0 refills | Status: DC

## 2022-04-08 NOTE — Progress Notes (Signed)
Physical Therapy Treatment Patient Details Name: Sheila Fernandez MRN: [id number] DOB: 06-18-1951 Today's Date: 04/08/2022   History of Present Illness Pt is a 71 y.o. female admitted 03/11/22 with chest pain, abdominal pain, SOB, FTT; admit to ICU with concern for possible aortic dissection. Pt with hypotension and AMS 6/14; ETT 6/14-6/15. CTA 6/14 with no evidence of aortic dissection. 6/15 code blue called due to unresponsiveness/bradycardia, concern for seizure; reintubated; LTM EEG without definitive seizures. 6/19 extubated and reintubated within the hour. S/p trach 6/20. BAL on 6/22 showed rare pseudomonas. S/p bronchoscopy 6/26 for pulmonary toilet. PMH includes HTN, orthostatic hypotension, sickle cell trait, arthritis.   PT Comments    Pt progressing with mobility. Pt tolerated ambulating ~210' with RW, requiring frequent modA for standing balance, noted increased fatigue this session; pt walked on PRVC full support with RT/RN and chair follow. Pt remains limited by generalized weakness, decreased activity tolerance, and impaired balance strategies/postural reactions. Noted plans for d/c to [facility] this afternoon. If pt to remain admitted, will continue to follow acutely to address established goals.     Recommendations for follow up therapy are one component of a multi-disciplinary discharge planning process, led by the attending physician.  Recommendations may be updated based on patient status, additional functional criteria and insurance authorization.  Follow Up Recommendations  PT at Long-term acute care hospital     Assistance Recommended at Discharge Frequent or constant Supervision/Assistance  Patient can return home with the following A lot of help with walking and/or transfers;A lot of help with bathing/dressing/bathroom;Assistance with cooking/housework;Assist for transportation;Help with stairs or ramp for entrance   Equipment Recommendations  Rolling walker (2  wheels);BSC/3in1;Wheelchair (measurements PT);Wheelchair cushion (measurements PT);Other (comment) (hospital bed)    Recommendations for Other Services       Precautions / Restrictions Precautions Precautions: Fall;Other (comment) Precaution Comments: trach, cortrak, vent Restrictions Weight Bearing Restrictions: No     Mobility  Bed Mobility Overal bed mobility: Needs Assistance Bed Mobility: Supine to Sit     Supine to sit: Min assist, HOB elevated     General bed mobility comments: MinA for HHA to elevate trunk and scoot hips to EOB; increased time and effort, pt requesting yaunker for suction once sitting EOB    Transfers Overall transfer level: Needs assistance Equipment used: Rolling walker (2 wheels) Transfers: Sit to/from Stand Sit to Stand: Min assist, Mod assist           General transfer comment: initial minA for trunk elevation standing from EOB; additional sit<>stand from recliner with modA for trunk elevation/stability and eccentric lowering to sit    Ambulation/Gait Ambulation/Gait assistance: Min assist, Mod assist, +2 safety/equipment Gait Distance (Feet): 210 Feet Assistive device: Rolling walker (2 wheels) Gait Pattern/deviations: Step-through pattern, Decreased stride length, Trunk flexed, Narrow base of support, Drifts right/left Gait velocity: Decreased     General Gait Details: slow, mildly unsteady gait with narrow BOS requiring frequent cues to widen step width; pt with improved ability to maintain upright posture this session, pt increasing R-side lateral lean and drifting RW to left with fatigue, requiring modA for stability and RW management; 1x seated rest break secondary to fatigue. close chair follow with RN, RT present to manage vent at 100% FiO2 during gait   Stairs             Wheelchair Mobility    Modified Rankin (Stroke Patients Only)       Balance Overall balance assessment: Needs assistance Sitting-balance  support: Feet  supported Sitting balance-Leahy Scale: Fair   Postural control: Right lateral lean Standing balance support: Reliant on assistive device for balance Standing balance-Leahy Scale: Poor Standing balance comment: Reliant on BUE support; increased need for external assist with fatigue                            Cognition Arousal/Alertness: Awake/alert Behavior During Therapy: Flat affect Overall Cognitive Status: Difficult to assess Area of Impairment: Safety/judgement, Problem solving                         Safety/Judgement: Decreased awareness of deficits   Problem Solving: Requires verbal cues General Comments: able to mouth words and make needs known; appropriately asking what's the difference between [facility] and current hospital situation. fatigued        Exercises      General Comments        Pertinent Vitals/Pain Pain Assessment Pain Assessment: No/denies pain Pain Intervention(s): Monitored during session    Home Living                          Prior Function            PT Goals (current goals can now be found in the care plan section) Progress towards PT goals: Progressing toward goals    Frequency    Min 3X/week      PT Plan Current plan remains appropriate    Co-evaluation              AM-PAC PT "6 Clicks" Mobility   Outcome Measure  Help needed turning from your back to your side while in a flat bed without using bedrails?: A Little Help needed moving from lying on your back to sitting on the side of a flat bed without using bedrails?: A Little Help needed moving to and from a bed to a chair (including a wheelchair)?: A Lot Help needed standing up from a chair using your arms (e.g., wheelchair or bedside chair)?: A Lot Help needed to walk in hospital room?: Total Help needed climbing 3-5 steps with a railing? : Total 6 Click Score: 12    End of Session Equipment Utilized During Treatment:   (vent via trach) Activity Tolerance: Patient tolerated treatment well;Patient limited by fatigue Patient left: in chair;with call bell/phone within reach;with nursing/sitter in room Nurse Communication: Mobility status PT Visit Diagnosis: Other abnormalities of gait and mobility (R26.89);Difficulty in walking, not elsewhere classified (R26.2);Muscle weakness (generalized) (M62.81)     Time: 7001-7494 PT Time Calculation (min) (ACUTE ONLY): 26 min  Charges:  $Gait Training: 8-22 mins $Therapeutic Activity: 8-22 mins                      Mabeline Caras, PT, DPT Acute Rehabilitation Services  Personal: [facility] Office: [place] 04/08/2022, 1:27 PM

## 2022-04-08 NOTE — Progress Notes (Signed)
Pt walked around unit on ventilator with RT, RN, and PT without any complications.

## 2022-04-08 NOTE — Discharge Summary (Signed)
Physician Discharge Summary         Patient ID: Sheila Fernandez MRN: [id number] DOB/AGE: April 27, 1951 71 y.o.  Admit date: 03/11/2022 Discharge date: 04/08/2022  Discharge Diagnoses:   Acute respiratory failure with hypoxia requiring mechanical ventilation Tracheostomy tube dependence Respiratory tract colonized with Pseudomonas Right sided effusion Anxiety Severe cervical radiculopathy and weakness of the upper extremities Acute anemia due to critical illness Protein-calorie malnutrition, severe Deconditioning  Pressure injury, coccyx, stage 2 R forearm blisters  Discharge summary   Patient is a 71 yo female w/ PMH of orthostatic hypotension, HTN, sickle cell trait, present to [facility] on 6/13 w/ chest/abdominal pain and Sob on 6/13.  Patient states epigastric pain started on 6/12 and radiated to his back. Presented [facility] ED. CT abdomen negative for dissection. Started on esmolol infusion for HTN initially. Shortly after patient became severely hypotensive requiring esmolol stopped and started on levophed. On 6/14 patient became encephalopathic and required intubation. EEG and CT head negative. Patient was extubated 6/15, but overnight patient became bradycardic in 40s and more confused. Patient was bagged to support breathing. HR improved without medications. Neuro exam showing upwardly deviated gaze and flexion of the R arm. Patient transferred to ICU and re-intubated. Given keppra and EEG placed. EEG remained negative for seizure. 6/17 CXR showing R sided pneumonia and was started on zosyn for possible aspiration. 6/19 extubated and required re-intubation shortly after due to hypercarbia. 6/20 trached. 6/26 bronchoscopy performed; BAL showing pseudomonas aeroginosa. ABX continued. Patient has underwent several TCT and PSV since being trached and has been difficult to wean from ventilator. Will transfer to [facility] for further care.   Discharge Plan by Active Problems    Acute  respiratory failure with hypoxia requiring mechanical ventilation Tracheostomy tube dependence Respiratory tract colonized with Pseudomonas Right sided effusion P: -transfer to [place] weaning per [facility] -trach care per protocol -IV lasix given 7/11; trend CXR -continue pulm toiletry -prn albuterol for wheezing; tobi nebulizer BID for secretions  Anxiety P: -seroquel 50 bid and clonazepam 0.5 daily prn for anxiety -further medications per [facility]  Severe cervical radiculopathy and weakness of the upper extremities P: -PT/OT  Acute anemia due to critical illness P: -trend cbc  Protein-calorie malnutrition, severe Deconditioning  P: -continue tube feeding per [facility]  -PT/OT  Pressure injury, coccyx, stage 2 P: -PT/OT -will likely need WOC to continue to follow at [facility]  R forearm blisters P: -unknown cause -continue monitor    Significant Hospital tests/ studies   Procedures   Tracheostomy 6/20 Culture data/antimicrobials   BAL showing Pseudomonas Aeruginosa 6/26   Consults  Neurology    Discharge Exam: BP (!) 160/89   Pulse (!) 103   Temp 97.9 F (36.6 C) (Oral)   Resp 18   Ht _0  (1.549 m)   Wt 55.4 kg   SpO2 100%   BMI 23.08 kg/m  Physical exam: General: female in NAD trached on mech vent HEENT: MM pink/moist; trach in place Neuro: Aox3; MAE CV: s1s2, tachy, no m/r/g PULM:  dim clear BS bilaterally; trached on mech vent PRVC GI: soft, bsx4 active  Extremities: warm/dry, no edema  Skin: R forearm blisters with pain, erythema, drainage   Labs at discharge   Lab Results  Component Value Date   CREATININE 0.70 04/08/2022   BUN 35 (H) 04/08/2022   NA 143 04/08/2022   K 3.9 04/08/2022   CL 102 04/08/2022   CO2 33 (H) 04/08/2022   Lab Results  Component Value Date   WBC 9.6 04/08/2022   HGB 7.0 (L) 04/08/2022   HCT 22.0 (L) 04/08/2022   MCV 90.2 04/08/2022   PLT 271 04/08/2022   Lab Results  Component Value  Date   ALT 67 (H) 03/29/2022   AST 16 03/29/2022   ALKPHOS 64 03/29/2022   BILITOT 0.8 03/29/2022   No results found for: "INR", "PROTIME"  Current radiological studies    DG CHEST PORT 1 VIEW  Result Date: 04/08/2022 CLINICAL DATA:  On ventilator. Respiratory failure. Pleural effusion. EXAM: PORTABLE CHEST 1 VIEW COMPARISON:  04/07/2022 FINDINGS: Tracheostomy tube remains in appropriate position. Feeding tube tip is again seen overlying the pylorus or duodenal bulb. Right arm PICC line remains in appropriate position. Mild cardiomegaly remains stable. Elevation of right hemidiaphragm is again seen. Subpulmonic right pleural effusion cannot be excluded. There is persistent opacity in the right lower lung, without significant change. IMPRESSION: Feeding tube tip overlies the pylorus or duodenal bulb. Stable right lower lung opacity, which may be due to infiltrate or atelectasis. Stable elevation of right hemidiaphragm; subpulmonic right pleural effusion cannot be excluded. Electronically Signed   By: Marlaine Hind M.D.   On: 04/08/2022 08:13   DG CHEST PORT 1 VIEW  Result Date: 04/07/2022 CLINICAL DATA:  71 year old female with pleural effusion. EXAM: PORTABLE CHEST 1 VIEW COMPARISON:  Portable chest 04/05/2022 and earlier. FINDINGS: Portable AP upright view at 1113 hours. Stable tracheostomy. Enteric feeding tube tip is in the right upper quadrant likely at the distal stomach. Stable right PICC line. Improved right lung volume and ventilation from yesterday, with substantially less pleural fluid visible on that side. Patchy and confluent residual right lung base opacity. No left pleural effusion is evident. Stable left lung ventilation. No overt edema. No pneumothorax. Normal cardiac size and mediastinal contours. Right upper quadrant surgical clips. Negative visible bowel gas. No acute osseous abnormality identified. IMPRESSION: 1. Improved right lung volume and ventilation from yesterday with  substantially less pleural fluid evident. Ongoing Patchy and confluent right lung base opacity is nonspecific. 2. Left lung is stable, negative. 3. Enteric feeding tube tip at the distal stomach level. Stable tracheostomy and right PICC line. Electronically Signed   By: Genevie Ann M.D.   On: 04/07/2022 11:37    Disposition:  [facility]      Allergies as of 04/08/2022       Reactions   Ativan [lorazepam] Other (See Comments)   Severe lethargy and unable to remember events    Codeine Nausea And Vomiting        Medication List     STOP taking these medications    amLODipine 2.5 MG tablet Commonly known as: NORVASC   carvedilol 25 MG tablet Commonly known as: COREG   co-enzyme Q-10 30 MG capsule   DANDELION ROOT PO   levocetirizine 5 MG tablet Commonly known as: XYZAL   losartan 100 MG tablet Commonly known as: COZAAR   mirtazapine 7.5 MG tablet Commonly known as: REMERON   prednisoLONE acetate 1 % ophthalmic suspension Commonly known as: Omnipred   VITAMIN D PO       TAKE these medications    albuterol (2.5 MG/3ML) 0.083% nebulizer solution Commonly known as: PROVENTIL Take 3 mLs (2.5 mg total) by nebulization every 4 (four) hours as needed for wheezing or shortness of breath.   docusate 50 MG/5ML liquid Commonly known as: COLACE Place 10 mLs (100 mg total) into feeding tube 2 (two) times daily as  needed for mild constipation.   guaiFENesin 100 MG/5ML liquid Commonly known as: ROBITUSSIN Place 15 mLs into feeding tube every 6 (six) hours.   lip balm ointment Apply topically as needed for lip care.   multivitamin with minerals Tabs tablet Place 1 tablet into feeding tube daily. Start taking on: April 09, 2022   ondansetron 4 MG/2ML Soln injection Commonly known as: ZOFRAN Inject 2 mLs (4 mg total) into the vein every 6 (six) hours as needed for nausea.   pantoprazole 40 MG tablet Commonly known as: PROTONIX Take 40 mg by mouth daily.    polyethylene glycol 17 g packet Commonly known as: MIRALAX / GLYCOLAX Place 17 g into feeding tube daily as needed for moderate constipation.   QUEtiapine 50 MG tablet Commonly known as: SEROQUEL Place 1 tablet (50 mg total) into feeding tube 2 (two) times daily.   thiamine 100 MG tablet Place 1 tablet (100 mg total) into feeding tube daily. Start taking on: April 09, 2022   tobramycin (PF) 300 MG/5ML nebulizer solution Commonly known as: TOBI Take 5 mLs (300 mg total) by nebulization 2 (two) times daily.         Follow-up appointment   [facility] Discharge Condition:    stable  Physician Statement:   The Patient was personally examined, the discharge assessment and plan has been personally reviewed and I agree with PA-C Skyelyn Scruggs's assessment and plan. 35 minutes of time have been dedicated to discharge assessment, planning and discharge instructions.   Signed: Mick Sell 04/08/2022, 12:56 PM

## 2022-04-08 NOTE — Progress Notes (Signed)
PT transported from 2H09 to 5E22 ([facility]) x2 RN without any complications.

## 2022-04-08 NOTE — Progress Notes (Signed)
CPT held at this time due to pt resting and sitting in chair. RT will continue to monitor.

## 2022-04-08 NOTE — TOC Transition Note (Signed)
Transition of Care [facility]) - CM/SW Discharge Note   Patient Details  Name: Sheila Fernandez MRN: [id number] Date of Birth: [birth year]  Transition of Care [facility]) CM/SW Contact:  Bethena Roys, RN Phone Number: 04/08/2022, 9:38 AM   Clinical Narrative:  Case Manager received notification that the patient has a bed available on [facility]. [facility] Liaison Delsa Sale will call the family to make them aware of the transition. No further needs from Case Manager at this time.   Final next level of care: Long Term Acute Care (LTAC) Barriers to Discharge: No Barriers Identified   Patient Goals and CMS Choice Patient states their goals for this hospitalization and ongoing recovery are:: bed available at [facility]   Choice offered to / list presented to : Adult Children     Discharge Plan and Services In-house Referral: NA Discharge Planning Services: CM Consult Post Acute Care Choice: Long Term Acute Care (LTAC)            DME Agency: NA             Readmission Risk Interventions     No data to display

## 2022-04-08 NOTE — Progress Notes (Signed)
NAME:  Sheila Fernandez, MRN:  [id number], DOB:  1950-12-29, LOS: 73 ADMISSION DATE:  03/11/2022, CONSULTATION DATE:  03/11/22 REFERRING MD:  Darl Householder, CHIEF COMPLAINT:  Chest pain, abdominal pain  History of Present Illness:  71 yo woman admitted 6/13 with chest pain, abdominal pain, SOB. Sudden onset epigastric pain radiating to back since yesterday with concern of dissection. CT abdomen/pelvis negative for AAS, initially on esmolol infusion then required Levophed for hypotension and intubation for encephalopathy. 6/14 requiring intubation. Spot EEG and head CT were negative.    Extubated 6/15 and transferred out of the ICU. Overnight on 6/15, pt was ambulating and conversing with family when she suddenly became restless and then altered and had an episode of bradycardia to the 40's.  A code blue was called, but pt never lost pulses, she did require bagging for a short time.  Her HR improved without medications and she was responding to some commands, but had upwardly deviated gaze and flexion of the R arm.  She was transferred to the ICU and re-intubated and loaded with Keppra. EEG negative. 6/17 CXR with right side PNA, started on Unasyn however due to continued fevers, changed to Zosyn on 6/18. 6/19 extubated, however shortly after requiring re-intubation for hypercarbia. 6/20 decision made to trach. 6/26 underwent bronchoscopy. Throughout stay with continued failing PS requiring vent support.   Pertinent  Medical History  HTN, Orthostatic hypotension, Anemia, Arthritis, Iritis, Recurrent Sickle cell trait  Echocardiogram 12/10/2021: Left ventricle cavity is normal in size and wall thickness. Normal global wall motion. Normal LV systolic function with EF 61%. Doppler evidence of grade I (impaired) diastolic dysfunction, normal LAP. Mild mitral valve stenosis. Mild tricuspid regurgitation. IVC not seen. No significant change compared to previous study in 2019.  Lexiscan Tetrofosmin stress test  02/01/2022:Normal myocardial perfusion. Stress LVEF 57%.Low risk study.  Significant Hospital Events: Including procedures, antibiotic start and stop dates in addition to other pertinent events   6/13 admitted to ICU for esmolol gtt with question of possible dissection 6/14 hypotensive, AMS on esmolol 300. Intubated. Starting NE. CT H neg. CTA no AAS. BAL: cytology neg.  Extubate 6/15 6/15-16 night Episode of AMS and bradycardia, concern for sz, re-intubated and loaded with Keppra 6/16 still no sz on EEG. MRI c spine and brain: and without contrast. Mild foraminal narrowing bilaterally at C2-3. Severe left and moderate right foraminal stenosis at C3-4. Moderate foraminal narrowing at C4-5 is worse left than right. Moderate right and mild left foraminal narrowing at C5-6.Mild left foraminal narrowing at C6-7. No sig change compared w/ MRI 4/11; No acute intracranial abnormality or significant interval change. 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 This likely reflects the sequela of chronic microvascular ischemia. 6/17 failed SBT attempt. PCXR w/ right sided PNA. Cultures sent. Unasyn started.  6/18 continued to spike fever despite addition of Unasyn, switched to [place] 6/19 extubated, had to be reintubated for hypercapnia 6/20: Trached 6/26: Bronchoscopy for pulmonary toilette.  7/1: Trach collar trial 7/2: PS trial , failed again, will need slow wean 7/4 3 hours of PSV  7/5 Failed PS trial again 7/6 PSV 12/5, ambulated with PT 7/10 Failed weaning trial, thick secretions   Interim History / Subjective:  Failed PSV trial this am due to anxiety and increase WOB Frothy secretions present CXR showing stable R opacity with pleural effusion  Objective   Blood pressure (!) 118/100, pulse 66, temperature 98.7 F (37.1 C), temperature source Oral, resp.  rate 18, height _0  (1.549 m), weight 56.7 kg, SpO2 100 %.    Vent Mode: PRVC FiO2 (%):   [40 %] 40 % Set Rate:  [18 bmp] 18 bmp Vt Set:  [380 mL] 380 mL PEEP:  [5 cmH20-8 cmH20] 8 cmH20 Pressure Support:  [12 cmH20] 12 cmH20 Plateau Pressure:  [16 cmH20-21 cmH20] 18 cmH20   Intake/Output Summary (Last 24 hours) at 04/08/2022 0856 Last data filed at 04/08/2022 0700 Gross per 24 hour  Intake 1380 ml  Output 2050 ml  Net -670 ml    Filed Weights   04/04/22 0500 04/05/22 0500 04/08/22 0500  Weight: 55 kg 54.5 kg 55.4 kg     Physical exam: General: female in NAD trached on mech vent HEENT: MM pink/moist; trach in place Neuro: Aox3; MAE CV: s1s2, tachy, no m/r/g PULM:  dim clear BS bilaterally; trached on mech vent PRVC GI: soft, bsx4 active  Extremities: warm/dry, no edema  Skin: R forearm blisters with pain, erythema, drainage     Resolved problem list:  Pseudomonas pneumonia right lung Hyperglycemia Possible autonomic dysfunction; resolved with medications being discontinued Hypernatremia  Assessment & Plan:   Acute respiratory failure with hypoxia requiring mechanical ventilation Tracheostomy tube dependence Respiratory tract colonized with Pseudomonas Right sided effusion per last CXR.  P: -PSV trials during day as tolerated -consider TCT if doing well on PSV -Rest on PRVC at night and during day as needed 6-8 cc/kg ideal body weight -Wean PEEP/FiO2 for SpO2 >92% -VAP bundle in place -trach care per protocol -repeat lasix 40 this am -Follow daily CXR  -prn BD; Tobi BID -pulm toiletry: CPT, PT/OT  Anxiety P: -consider increasing seroquel; check qtc -continue clonazepam  Severe cervical radiculopathy and weakness of the upper extremities P: -PT/OT   Acute anemia due to critical illness Sickle cell trait  P: -trend H/H; transfuse for hgb < 7  Protein-calorie malnutrition, severe Deconditioning  Generalized muscle waisting over the course of last several months. May be related to underlying neurologic process? P: -continue TF  -  PT/OT  Pressure injury, coccyx, stage 2 P: -wound care per nursing -PT/OT  R forearm blisters P: -unknown cause -monitor  Long-term care need P: - Continue efforts at long-term weaning; is a candidate for LTAC. No barriers to transfer at this point if she gets accepted.    Addendum: awaiting insurance approval for LTAC bed  Best Practice (right click and "Reselect all SmartList Selections" daily)   Diet/type: tubefeeds. Continues on FW (will follow sodium in AM, maybe able to decrease)  DVT prophylaxis: Lovenox GI prophylaxis: PPI Lines: Central line -PICC Foley:  Purewick Code Status:  full code Last date of multidisciplinary goals of care discussion: 7/11 spoke with patient and daughter at bedside   CC APP time 35 minutes  JD Rexene Agent [facility] 04/08/2022, 9:09 AM  Please see [email or web address] for pager details.  From 7A-7P if no response, please call [phone]. After hours, please call ELink [phone].

## 2022-04-08 NOTE — Progress Notes (Signed)
eLink Physician-Brief Progress Note Patient Name: Sheila Fernandez DOB: 1951/02/25 MRN: [id number]   Date of Service  04/08/2022  HPI/Events of Note  Hemoglobin 7.0,  no overt evidence of bleeding.  eICU Interventions  Continue to trend hemoglobin and transfuse for hemoglobin less than 7 gm / dl.                                                                                                                                                                                             Kerry Kass Donja Tipping 04/08/2022, 5:32 AM

## 2022-04-09 DIAGNOSIS — J90 Pleural effusion, not elsewhere classified: Secondary | ICD-10-CM

## 2022-04-09 DIAGNOSIS — J189 Pneumonia, unspecified organism: Secondary | ICD-10-CM

## 2022-04-09 DIAGNOSIS — D573 Sickle-cell trait: Secondary | ICD-10-CM

## 2022-04-09 DIAGNOSIS — Z930 Tracheostomy status: Secondary | ICD-10-CM

## 2022-04-09 DIAGNOSIS — J9621 Acute and chronic respiratory failure with hypoxia: Secondary | ICD-10-CM

## 2022-04-09 LAB — CBC
HCT: 23.1 % — ABNORMAL LOW (ref 36.0–46.0)
Hemoglobin: 7.3 g/dL — ABNORMAL LOW (ref 12.0–15.0)
MCH: 28.1 pg (ref 26.0–34.0)
MCHC: 31.6 g/dL (ref 30.0–36.0)
MCV: 88.8 fL (ref 80.0–100.0)
Platelets: 324 10*3/uL (ref 150–400)
RBC: 2.6 MIL/uL — ABNORMAL LOW (ref 3.87–5.11)
RDW: 15.6 % — ABNORMAL HIGH (ref 11.5–15.5)
WBC: 7.1 10*3/uL (ref 4.0–10.5)
nRBC: 0 % (ref 0.0–0.2)

## 2022-04-09 LAB — COMPREHENSIVE METABOLIC PANEL
ALT: 42 U/L (ref 0–44)
AST: 20 U/L (ref 15–41)
Albumin: 2.6 g/dL — ABNORMAL LOW (ref 3.5–5.0)
Alkaline Phosphatase: 96 U/L (ref 38–126)
Anion gap: 8 (ref 5–15)
BUN: 34 mg/dL — ABNORMAL HIGH (ref 8–23)
CO2: 33 mmol/L — ABNORMAL HIGH (ref 22–32)
Calcium: 9.7 mg/dL (ref 8.9–10.3)
Chloride: 104 mmol/L (ref 98–111)
Creatinine, Ser: 0.71 mg/dL (ref 0.44–1.00)
GFR, Estimated: >60 mL/min (ref >60)
Glucose, Bld: 112 mg/dL — ABNORMAL HIGH (ref 70–99)
Potassium: 3.5 mmol/L (ref 3.5–5.1)
Sodium: 145 mmol/L (ref 135–145)
Total Bilirubin: 0.4 mg/dL (ref 0.3–1.2)
Total Protein: 6.5 g/dL (ref 6.5–8.1)

## 2022-04-09 LAB — IRON AND TIBC
Iron: 35 ug/dL (ref 28–170)
Saturation Ratios: 15 % (ref 10.4–31.8)
TIBC: 228 ug/dL — ABNORMAL LOW (ref 250–450)
UIBC: 193 ug/dL

## 2022-04-09 LAB — FOLATE: Folate: 25.9 ng/mL (ref >5.9)

## 2022-04-09 LAB — TSH: TSH: 0.802 u[IU]/mL (ref 0.350–4.500)

## 2022-04-09 LAB — MAGNESIUM: Magnesium: 2 mg/dL (ref 1.7–2.4)

## 2022-04-09 LAB — PHOSPHORUS: Phosphorus: 3.5 mg/dL (ref 2.5–4.6)

## 2022-04-09 LAB — LACTIC ACID, PLASMA: Lactic Acid, Venous: 0.7 mmol/L (ref 0.5–1.9)

## 2022-04-09 NOTE — Consult Note (Signed)
Pulmonary [place]  Date of Service: 04/09/2022  PULMONARY CRITICAL CARE CONSULT   Sheila Fernandez  OYD:[id number]  DOB: 1950/11/20   DOA: 04/08/2022  Referring Physician: Satira Sark, MD  HPI: Sheila Fernandez is a 71 y.o. female seen for follow up of Acute on Chronic Respiratory Failure.  Patient has multiple medical problems including hypertension arthritis hypotension orthostasis sickle cell trait fibroids came into the hospital because of a diagnosis of pneumonia.  The patient apparently ended up intubated placed on mechanical ventilation.  Initial presentation was with what appeared to be a chest sickle cell crisis.  Patient initially was started on esmolol for high blood pressure then dropped blood pressure so the infusion was stopped and patient was switched over to Levophed.  Patient became fairly encephalopathic ended up intubated on mechanical ventilation.  EEG was done which was unremarkable.  Patient apparently had what appeared to be seizure-like activity and was placed on Keppra.  Meantime chest x-ray revealed a right-sided pneumonia Patient was treated for the pneumonia.  Review of Systems:  ROS performed and is unremarkable other than noted above.  Past Medical History:  Diagnosis Date   Adenomyosis    Anemia    Arthritis    Hypertension    Iritis, recurrent    Orthostatic hypotension 01/21/2019   Sickle cell trait ([place])    Uterine fibroid     Past Surgical History:  Procedure Laterality Date   BREAST BIOPSY Left 1986   benign excisional no scar seen    BREAST LUMPECTOMY Left    Pt does not recall having lumpectomy   CESAREAN SECTION     CHOLECYSTECTOMY     FOOT SURGERY     cyst removed    Social History:    reports that she quit smoking about 18 years ago. Her smoking use included cigarettes. She has a 15.00 pack-year smoking history. She has never used smokeless tobacco. She  reports that she does not drink alcohol and does not use drugs.  Family History: Non-Contributory to the present illness  Allergies  Allergen Reactions   Ativan [Lorazepam] Other (See Comments)    Severe lethargy and unable to remember events    Codeine Nausea And Vomiting    Medications: Reviewed on Rounds  Physical Exam:  Vitals: Temperature is 98.9 pulse of 103 respiratory rate was 22  Ventilator Settings on assist control FiO2 35% tidal volume 380 PEEP of 5  General: Comfortable at this time Eyes: Grossly normal lids, irises & conjunctiva ENT: grossly tongue is normal Neck: no obvious mass Cardiovascular: S1-S2 normal no gallop or rub Respiratory: Scattered coarse rhonchi noted bilateral Abdomen: Soft and nontender Skin: no rash seen on limited exam Musculoskeletal: not rigid Psychiatric:unable to assess Neurologic: no seizure no involuntary movements         Labs on Admission:  Basic Metabolic Panel: Recent Labs  Lab 04/03/22 0331 04/04/22 0449 04/06/22 0330 04/06/22 1148 04/07/22 0249 04/08/22 0336 04/09/22 0555  NA 137   < > 135 136 136 143 145  K 3.9   < > 3.9 3.9 4.0 3.9 3.5  CL 102   < > 99 96* 100 102 104  CO2 31   < > '29 29 30 '$ 33* 33*  GLUCOSE 104*   < > 150* 120* 121* 119* 112*  BUN 36*   < > 32* 36* 39* 35* 34*  CREATININE 0.72   < > 0.69 0.67 0.73 0.70 0.71  CALCIUM 9.3   < > 9.1 9.2 9.1 9.3 9.7  MG 2.0  --   --   --   --   --  2.0  PHOS  --   --   --   --   --   --  3.5   < > = values in this interval not displayed.    No results for input(s): "PHART", "PCO2ART", "PO2ART", "HCO3", "O2SAT" in the last 168 hours.  Liver Function Tests: Recent Labs  Lab 04/09/22 0555  AST 20  ALT 42  ALKPHOS 96  BILITOT 0.4  PROT 6.5  ALBUMIN 2.6*   No results for input(s): "LIPASE", "AMYLASE" in the last 168 hours. No results for input(s): "AMMONIA" in the last 168 hours.  CBC: Recent Labs  Lab 04/06/22 1148 04/07/22 0249 04/07/22 0400  04/08/22 0336 04/09/22 0555  WBC 15.3* 12.0* 13.2* 9.6 7.1  NEUTROABS  --   --   --  6.8  --   HGB 7.7* 6.6* 7.2* 7.0* 7.3*  HCT 23.6* 20.3* 22.6* 22.0* 23.1*  MCV 87.1 88.6 89.7 90.2 88.8  PLT 299 260 291 271 324    Cardiac Enzymes: No results for input(s): "CKTOTAL", "CKMB", "CKMBINDEX", "TROPONINI" in the last 168 hours.  BNP (last 3 results) Recent Labs    04/08/22 0336  BNP 51.6    ProBNP (last 3 results) No results for input(s): "PROBNP" in the last 8760 hours.   Radiological Exams on Admission: DG Abd Portable 1V  Result Date: 04/08/2022 CLINICAL DATA:  NG tube EXAM: PORTABLE ABDOMEN - 1 VIEW COMPARISON:  03/27/2022 FINDINGS: Esophageal tube tip overlies the duodenal bulb region. Nonobstructed gas pattern with moderate stool. IMPRESSION: Esophageal tube tip overlies the expected location of duodenal bulb. Electronically Signed   By: Donavan Foil M.D.   On: 04/08/2022 20:41   DG CHEST PORT 1 VIEW  Result Date: 04/08/2022 CLINICAL DATA:  Tracheostomy EXAM: PORTABLE CHEST 1 VIEW COMPARISON:  04/08/2022, 04/07/2022, 04/05/2022 FINDINGS: Right upper extremity central venous catheter tip over cavoatrial region. Tracheostomy tube tip about 4.8 cm superior to the carina. Esophageal tube tip below the diaphragm. Cardiomegaly with elevated right diaphragm. Airspace disease at the right base. Vascular congestion. No pneumothorax. IMPRESSION: 1. Tracheostomy tube with tip about 4.8 cm superior to carina. 2. Cardiomegaly with vascular congestion. Similar airspace disease at the right greater than left lung base. Electronically Signed   By: Donavan Foil M.D.   On: 04/08/2022 20:41   DG CHEST PORT 1 VIEW  Result Date: 04/08/2022 CLINICAL DATA:  On ventilator. Respiratory failure. Pleural effusion. EXAM: PORTABLE CHEST 1 VIEW COMPARISON:  04/07/2022 FINDINGS: Tracheostomy tube remains in appropriate position. Feeding tube tip is again seen overlying the pylorus or duodenal bulb. Right  arm PICC line remains in appropriate position. Mild cardiomegaly remains stable. Elevation of right hemidiaphragm is again seen. Subpulmonic right pleural effusion cannot be excluded. There is persistent opacity in the right lower lung, without significant change. IMPRESSION: Feeding tube tip overlies the pylorus or duodenal bulb. Stable right lower lung opacity, which may be due to infiltrate or atelectasis. Stable elevation of right hemidiaphragm; subpulmonic right pleural effusion cannot be excluded. Electronically Signed   By: Marlaine Hind M.D.   On: 04/08/2022 08:13   DG CHEST PORT 1 VIEW  Result Date: 04/07/2022 CLINICAL DATA:  71 year old female with pleural effusion. EXAM: PORTABLE CHEST 1 VIEW COMPARISON:  Portable chest 04/05/2022 and earlier. FINDINGS: Portable AP upright view at 1113 hours. Stable tracheostomy.  Enteric feeding tube tip is in the right upper quadrant likely at the distal stomach. Stable right PICC line. Improved right lung volume and ventilation from yesterday, with substantially less pleural fluid visible on that side. Patchy and confluent residual right lung base opacity. No left pleural effusion is evident. Stable left lung ventilation. No overt edema. No pneumothorax. Normal cardiac size and mediastinal contours. Right upper quadrant surgical clips. Negative visible bowel gas. No acute osseous abnormality identified. IMPRESSION: 1. Improved right lung volume and ventilation from yesterday with substantially less pleural fluid evident. Ongoing Patchy and confluent right lung base opacity is nonspecific. 2. Left lung is stable, negative. 3. Enteric feeding tube tip at the distal stomach level. Stable tracheostomy and right PICC line. Electronically Signed   By: Genevie Ann M.D.   On: 04/07/2022 11:37    Assessment/Plan Active Problems:   Sickle cell trait (HCC)   Acute on chronic respiratory failure with hypoxia (HCC)   Pleural effusion, right   Tracheostomy status (HCC)    Healthcare-associated pneumonia   Acute on chronic respiratory failure with hypoxia patient is on the ventilator has been on 35% FiO2 we will have respiratory therapy assess the R SBI and mechanics and try to start weaning the patient if able to tolerate. Right-sided pleural effusion the patient's last chest x-ray showed what appears to be a subpulmonic right pleural effusion with elevation of the hemidiaphragm.  CT scan might be considered to determine how much fluid may be there. Tracheostomy status patient at this time has tracheostomy in place going to work on trying to wean Healthcare associated pneumonia patient apparently had Pseudomonas cultured was felt to be due to colonization we will continue to monitor Sickle cell trait appropriate pain management  I have personally seen and evaluated the patient, evaluated laboratory and imaging results, formulated the assessment and plan and placed orders. The Patient requires high complexity decision making with multiple systems involvement.  Case was discussed on Rounds with the Respiratory Therapy Director and the Respiratory staff Time Spent 71mnutes  Fue Cervenka A Mirella Gueye, MD [facility]ella [facility] CenterPulmonary Critical Care Medicine Sleep Medicine

## 2022-04-10 LAB — RETICULOCYTES
Immature Retic Fract: 12.6 % (ref 2.3–15.9)
RBC.: 3.01 MIL/uL — ABNORMAL LOW (ref 3.87–5.11)
Retic Count, Absolute: 40.9 10*3/uL (ref 19.0–186.0)
Retic Ct Pct: 1.4 % (ref 0.4–3.1)

## 2022-04-10 LAB — IRON AND TIBC
Iron: 44 ug/dL (ref 28–170)
Saturation Ratios: 17 % (ref 10.4–31.8)
TIBC: 253 ug/dL (ref 250–450)
UIBC: 209 ug/dL

## 2022-04-10 LAB — PREPARE RBC (CROSSMATCH)

## 2022-04-10 NOTE — Progress Notes (Addendum)
Pulmonary [place]   PULMONARY CRITICAL CARE SERVICE  PROGRESS NOTE     JENNETTE LEASK  QBH:[id number]  DOB: [birth year]   DOA: 04/08/2022  Referring Physician: Satira Sark, MD  HPI: Sheila Fernandez is a 71 y.o. female being followed for ventilator/airway/oxygen weaning Acute on Chronic Respiratory Failure.  Patient seen lying in bed, currently is on a 4-hour pressure support goal.  #6 Shiley in place.  No acute distress, no acute overnight events.  Medications: Reviewed on Rounds  Physical Exam:  Vitals: Temp 96.8, pulse 78, respirations 20, BP 156/91, SPO2 100%  Ventilator Settings pressure support 12/5 FiO2 35%  General: Comfortable at this time Neck: supple Cardiovascular: no malignant arrhythmias Respiratory: Bilaterally clear Skin: no rash seen on limited exam Musculoskeletal: No gross abnormality Psychiatric:unable to assess Neurologic:no involuntary movements         Lab Data:   Basic Metabolic Panel: Recent Labs  Lab 04/06/22 0330 04/06/22 1148 04/07/22 0249 04/08/22 0336 04/09/22 0555  NA 135 136 136 143 145  K 3.9 3.9 4.0 3.9 3.5  CL 99 96* 100 102 104  CO2 '29 29 30 '$ 33* 33*  GLUCOSE 150* 120* 121* 119* 112*  BUN 32* 36* 39* 35* 34*  CREATININE 0.69 0.67 0.73 0.70 0.71  CALCIUM 9.1 9.2 9.1 9.3 9.7  MG  --   --   --   --  2.0  PHOS  --   --   --   --  3.5    ABG: No results for input(s): "PHART", "PCO2ART", "PO2ART", "HCO3", "O2SAT" in the last 168 hours.  Liver Function Tests: Recent Labs  Lab 04/09/22 0555  AST 20  ALT 42  ALKPHOS 96  BILITOT 0.4  PROT 6.5  ALBUMIN 2.6*   No results for input(s): "LIPASE", "AMYLASE" in the last 168 hours. No results for input(s): "AMMONIA" in the last 168 hours.  CBC: Recent Labs  Lab 04/06/22 1148 04/07/22 0249 04/07/22 0400 04/08/22 0336 04/09/22 0555  WBC 15.3* 12.0* 13.2* 9.6 7.1  NEUTROABS  --   --   --  6.8  --   HGB 7.7* 6.6*  7.2* 7.0* 7.3*  HCT 23.6* 20.3* 22.6* 22.0* 23.1*  MCV 87.1 88.6 89.7 90.2 88.8  PLT 299 260 291 271 324    Cardiac Enzymes: No results for input(s): "CKTOTAL", "CKMB", "CKMBINDEX", "TROPONINI" in the last 168 hours.  BNP (last 3 results) Recent Labs    04/08/22 0336  BNP 51.6    ProBNP (last 3 results) No results for input(s): "PROBNP" in the last 8760 hours.  Radiological Exams: DG Abd Portable 1V  Result Date: 04/08/2022 CLINICAL DATA:  NG tube EXAM: PORTABLE ABDOMEN - 1 VIEW COMPARISON:  03/27/2022 FINDINGS: Esophageal tube tip overlies the duodenal bulb region. Nonobstructed gas pattern with moderate stool. IMPRESSION: Esophageal tube tip overlies the expected location of duodenal bulb. Electronically Signed   By: Donavan Foil M.D.   On: 04/08/2022 20:41   DG CHEST PORT 1 VIEW  Result Date: 04/08/2022 CLINICAL DATA:  Tracheostomy EXAM: PORTABLE CHEST 1 VIEW COMPARISON:  04/08/2022, 04/07/2022, 04/05/2022 FINDINGS: Right upper extremity central venous catheter tip over cavoatrial region. Tracheostomy tube tip about 4.8 cm superior to the carina. Esophageal tube tip below the diaphragm. Cardiomegaly with elevated right diaphragm. Airspace disease at the right base. Vascular congestion. No pneumothorax. IMPRESSION: 1. Tracheostomy tube with tip about 4.8 cm superior to carina. 2. Cardiomegaly with vascular congestion. Similar airspace disease at  the right greater than left lung base. Electronically Signed   By: Donavan Foil M.D.   On: 04/08/2022 20:41    Assessment/Plan Active Problems:   Sickle cell trait (HCC)   Acute on chronic respiratory failure with hypoxia (HCC)   Pleural effusion, right   Tracheostomy status (HCC)   Healthcare-associated pneumonia   Acute on chronic respiratory failure with hypoxia-patient currently on a 4-hour pressure support goal for today.  Continue weaning per protocol and as patient tolerates. Right-sided pleural effusion -the patient's last  chest x-ray showed what appears to be a subpulmonic right pleural effusion with elevation of the hemidiaphragm.  CT scan might be considered to determine how much fluid may be there. Tracheostomy-#6 Shiley remains in place.  Continue with trach care per protocol.  Healthcare associated pneumonia -patient apparently had Pseudomonas. culture was felt to be due to colonization ,we will continue to monitor Sickle cell trait -appropriate pain management.  I have personally seen and evaluated the patient, evaluated laboratory and imaging results, formulated the assessment and plan and placed orders. The Patient requires high complexity decision making with multiple systems involvement.  Rounds were done with the Respiratory Therapy Director and Staff therapists and discussed with nursing staff also.  Allyne Gee, MD [facility] Pulmonary Critical Care Medicine Sleep Medicine

## 2022-04-11 LAB — PREPARE FRESH FROZEN PLASMA: Unit division: 0

## 2022-04-11 LAB — BPAM RBC
Blood Product Expiration Date: 202307192359
ISSUE DATE / TIME: 202307132333
Unit Type and Rh: 9500

## 2022-04-11 LAB — TYPE AND SCREEN
ABO/RH(D): O NEG
Antibody Screen: NEGATIVE
Unit division: 0

## 2022-04-11 LAB — CBC
HCT: 30.6 % — ABNORMAL LOW (ref 36.0–46.0)
Hemoglobin: 9.5 g/dL — ABNORMAL LOW (ref 12.0–15.0)
MCH: 27.7 pg (ref 26.0–34.0)
MCHC: 31 g/dL (ref 30.0–36.0)
MCV: 89.2 fL (ref 80.0–100.0)
Platelets: 347 10*3/uL (ref 150–400)
RBC: 3.43 MIL/uL — ABNORMAL LOW (ref 3.87–5.11)
RDW: 17.1 % — ABNORMAL HIGH (ref 11.5–15.5)
WBC: 6 10*3/uL (ref 4.0–10.5)
nRBC: 0 % (ref 0.0–0.2)

## 2022-04-11 LAB — BPAM FFP
Blood Product Expiration Date: 202307162359
ISSUE DATE / TIME: 202307121117
Unit Type and Rh: 6200

## 2022-04-11 NOTE — Progress Notes (Cosign Needed)
Pulmonary [place]   PULMONARY CRITICAL CARE SERVICE  PROGRESS NOTE     Sheila Fernandez  BTD:[id number]  DOB: [birth year]   DOA: 04/08/2022  Referring Physician: Satira Sark, MD  HPI: Sheila SHEVAWN Fernandez is a 71 y.o. female being followed for ventilator/airway/oxygen weaning Acute on Chronic Respiratory Failure.  Patient seen sitting up in chair.  Was able to complete 4-hour pressure support goal yesterday.  We have ordered 16-hour pressure support goal today, with PMV in place.  No acute distress, no acute overnight events.  Medications: Reviewed on Rounds  Physical Exam:  Vitals: Temp 97.2, pulse 67, respirations 21, BP 120/77, SPO2 99%  Ventilator Settings pressure support 12/5 FiO2 35%  General: Comfortable at this time Neck: supple Cardiovascular: no malignant arrhythmias Respiratory: Bilaterally clear Skin: no rash seen on limited exam Musculoskeletal: No gross abnormality Psychiatric:unable to assess Neurologic:no involuntary movements         Lab Data:   Basic Metabolic Panel: Recent Labs  Lab 04/06/22 0330 04/06/22 1148 04/07/22 0249 04/08/22 0336 04/09/22 0555  NA 135 136 136 143 145  K 3.9 3.9 4.0 3.9 3.5  CL 99 96* 100 102 104  CO2 '29 29 30 '$ 33* 33*  GLUCOSE 150* 120* 121* 119* 112*  BUN 32* 36* 39* 35* 34*  CREATININE 0.69 0.67 0.73 0.70 0.71  CALCIUM 9.1 9.2 9.1 9.3 9.7  MG  --   --   --   --  2.0  PHOS  --   --   --   --  3.5    ABG: No results for input(s): "PHART", "PCO2ART", "PO2ART", "HCO3", "O2SAT" in the last 168 hours.  Liver Function Tests: Recent Labs  Lab 04/09/22 0555  AST 20  ALT 42  ALKPHOS 96  BILITOT 0.4  PROT 6.5  ALBUMIN 2.6*   No results for input(s): "LIPASE", "AMYLASE" in the last 168 hours. No results for input(s): "AMMONIA" in the last 168 hours.  CBC: Recent Labs  Lab 04/07/22 0249 04/07/22 0400 04/08/22 0336 04/09/22 0555 04/11/22 0623  WBC 12.0*  13.2* 9.6 7.1 6.0  NEUTROABS  --   --  6.8  --   --   HGB 6.6* 7.2* 7.0* 7.3* 9.5*  HCT 20.3* 22.6* 22.0* 23.1* 30.6*  MCV 88.6 89.7 90.2 88.8 89.2  PLT 260 291 271 324 347    Cardiac Enzymes: No results for input(s): "CKTOTAL", "CKMB", "CKMBINDEX", "TROPONINI" in the last 168 hours.  BNP (last 3 results) Recent Labs    04/08/22 0336  BNP 51.6    ProBNP (last 3 results) No results for input(s): "PROBNP" in the last 8760 hours.  Radiological Exams: No results found.  Assessment/Plan Active Problems:   Sickle cell trait (HCC)   Acute on chronic respiratory failure with hypoxia (HCC)   Pleural effusion, right   Tracheostomy status ([place])   Healthcare-associated pneumonia   Acute on chronic respiratory failure with hypoxia-patient currently on a 16-hour pressure support goal for today.  Continue weaning per protocol and as patient tolerates. Right-sided pleural effusion -the patient's last chest x-ray showed what appears to be a subpulmonic right pleural effusion with elevation of the hemidiaphragm.  CT scan might be considered to determine how much fluid may be there. Tracheostomy-#6 Shiley remains in place.  Continue with trach care per protocol.  Healthcare associated pneumonia -patient apparently had Pseudomonas. culture was felt to be due to colonization ,we will continue to monitor Sickle cell trait -appropriate pain  management.   I have personally seen and evaluated the patient, evaluated laboratory and imaging results, formulated the assessment and plan and placed orders. The Patient requires high complexity decision making with multiple systems involvement.  Rounds were done with the Respiratory Therapy Director and Staff therapists and discussed with nursing staff also.  Allyne Gee, MD [facility] Pulmonary Critical Care Medicine Sleep Medicine

## 2022-04-12 NOTE — Progress Notes (Cosign Needed)
Pulmonary [place]   PULMONARY CRITICAL CARE SERVICE  PROGRESS NOTE     Sheila Fernandez  IHK:[id number]  DOB: 1950/12/05   DOA: 04/08/2022  Referring Physician: Satira Sark, MD  HPI: Sheila Fernandez is a 71 y.o. female being followed for ventilator/airway/oxygen weaning Acute on Chronic Respiratory Failure.  Patient seen lying in bed, currently on pressure support.  Has a 14-hour pressure support goal and 2-hour ATC goal today.  Was successful with 16-hour pressure support goal yesterday.  No acute distress, no acute overnight events.  Medications: Reviewed on Rounds  Physical Exam:  Vitals: Temp 97.5, pulse 70, respirations 18, BP 120/71, SPO2 99%  Ventilator Settings pressure support 12/5, FiO2 35%  General: Comfortable at this time Neck: supple Cardiovascular: no malignant arrhythmias Respiratory: Bilaterally coarse Skin: no rash seen on limited exam Musculoskeletal: No gross abnormality Psychiatric:unable to assess Neurologic:no involuntary movements         Lab Data:   Basic Metabolic Panel: Recent Labs  Lab 04/06/22 0330 04/06/22 1148 04/07/22 0249 04/08/22 0336 04/09/22 0555  NA 135 136 136 143 145  K 3.9 3.9 4.0 3.9 3.5  CL 99 96* 100 102 104  CO2 '29 29 30 '$ 33* 33*  GLUCOSE 150* 120* 121* 119* 112*  BUN 32* 36* 39* 35* 34*  CREATININE 0.69 0.67 0.73 0.70 0.71  CALCIUM 9.1 9.2 9.1 9.3 9.7  MG  --   --   --   --  2.0  PHOS  --   --   --   --  3.5    ABG: No results for input(s): "PHART", "PCO2ART", "PO2ART", "HCO3", "O2SAT" in the last 168 hours.  Liver Function Tests: Recent Labs  Lab 04/09/22 0555  AST 20  ALT 42  ALKPHOS 96  BILITOT 0.4  PROT 6.5  ALBUMIN 2.6*   No results for input(s): "LIPASE", "AMYLASE" in the last 168 hours. No results for input(s): "AMMONIA" in the last 168 hours.  CBC: Recent Labs  Lab 04/07/22 0249 04/07/22 0400 04/08/22 0336 04/09/22 0555  04/11/22 0623  WBC 12.0* 13.2* 9.6 7.1 6.0  NEUTROABS  --   --  6.8  --   --   HGB 6.6* 7.2* 7.0* 7.3* 9.5*  HCT 20.3* 22.6* 22.0* 23.1* 30.6*  MCV 88.6 89.7 90.2 88.8 89.2  PLT 260 291 271 324 347    Cardiac Enzymes: No results for input(s): "CKTOTAL", "CKMB", "CKMBINDEX", "TROPONINI" in the last 168 hours.  BNP (last 3 results) Recent Labs    04/08/22 0336  BNP 51.6    ProBNP (last 3 results) No results for input(s): "PROBNP" in the last 8760 hours.  Radiological Exams: No results found.  Assessment/Plan Active Problems:   Sickle cell trait (HCC)   Acute on chronic respiratory failure with hypoxia (HCC)   Pleural effusion, right   Tracheostomy status ([place])   Healthcare-associated pneumonia   Acute on chronic respiratory failure with hypoxia-patient was successful with 16-hour pressure support goal yesterday.  Currently on a 2-hour ATC and 14-hour pressure support goal today.  Continue weaning per protocol and as patient tolerates. Right-sided pleural effusion -the patient's last chest x-ray showed what appears to be a subpulmonic right pleural effusion with elevation of the hemidiaphragm.  CT scan might be considered to determine how much fluid may be there. Tracheostomy-#6 Shiley remains in place.  Continue with trach care per protocol.  Healthcare associated pneumonia -patient apparently had Pseudomonas. culture was felt to be due  to colonization ,we will continue to monitor. Sickle cell trait -appropriate pain management.   I have personally seen and evaluated the patient, evaluated laboratory and imaging results, formulated the assessment and plan and placed orders. The Patient requires high complexity decision making with multiple systems involvement.  Rounds were done with the Respiratory Therapy Director and Staff therapists and discussed with nursing staff also.  Allyne Gee, MD [facility] Pulmonary Critical Care Medicine Sleep Medicine

## 2022-04-13 NOTE — Progress Notes (Cosign Needed)
Pulmonary [place]   PULMONARY CRITICAL CARE SERVICE  PROGRESS NOTE     Sheila Fernandez  XBJ:[id number]  DOB: November 07, 1950   DOA: 04/08/2022  Referring Physician: Satira Sark, MD  HPI: Sheila Fernandez is a 71 y.o. female being followed for ventilator/airway/oxygen weaning Acute on Chronic Respiratory Failure.  Patient seen lying in bed, able to complete 2-hour ATC goal yesterday. Has a 4-hour ATC goal with 12-hour pressure support goal today.  No acute distress, no acute overnight events.  Medications: Reviewed on Rounds  Physical Exam:  Vitals: Temp 97.3, pulse 73, respirations 26, BP 150/89, SPO2 98%  Ventilator Settings pressure support 12/5, FiO2 35%  General: Comfortable at this time Neck: supple Cardiovascular: no malignant arrhythmias Respiratory: Bilaterally coarse Skin: no rash seen on limited exam Musculoskeletal: No gross abnormality Psychiatric:unable to assess Neurologic:no involuntary movements         Lab Data:   Basic Metabolic Panel: Recent Labs  Lab 04/07/22 0249 04/08/22 0336 04/09/22 0555  NA 136 143 145  K 4.0 3.9 3.5  CL 100 102 104  CO2 30 33* 33*  GLUCOSE 121* 119* 112*  BUN 39* 35* 34*  CREATININE 0.73 0.70 0.71  CALCIUM 9.1 9.3 9.7  MG  --   --  2.0  PHOS  --   --  3.5    ABG: No results for input(s): "PHART", "PCO2ART", "PO2ART", "HCO3", "O2SAT" in the last 168 hours.  Liver Function Tests: Recent Labs  Lab 04/09/22 0555  AST 20  ALT 42  ALKPHOS 96  BILITOT 0.4  PROT 6.5  ALBUMIN 2.6*   No results for input(s): "LIPASE", "AMYLASE" in the last 168 hours. No results for input(s): "AMMONIA" in the last 168 hours.  CBC: Recent Labs  Lab 04/07/22 0249 04/07/22 0400 04/08/22 0336 04/09/22 0555 04/11/22 0623  WBC 12.0* 13.2* 9.6 7.1 6.0  NEUTROABS  --   --  6.8  --   --   HGB 6.6* 7.2* 7.0* 7.3* 9.5*  HCT 20.3* 22.6* 22.0* 23.1* 30.6*  MCV 88.6 89.7 90.2 88.8  89.2  PLT 260 291 271 324 347    Cardiac Enzymes: No results for input(s): "CKTOTAL", "CKMB", "CKMBINDEX", "TROPONINI" in the last 168 hours.  BNP (last 3 results) Recent Labs    04/08/22 0336  BNP 51.6    ProBNP (last 3 results) No results for input(s): "PROBNP" in the last 8760 hours.  Radiological Exams: No results found.  Assessment/Plan Active Problems:   Sickle cell trait (HCC)   Acute on chronic respiratory failure with hypoxia (HCC)   Pleural effusion, right   Tracheostomy status (HCC)   Healthcare-associated pneumonia  Acute on chronic respiratory failure with hypoxia-was successful completing 2-hour ATC goal yesterday.  Is working towards a 4-hour ATC goal with a 12-hour pressure support goal today.  Continue weaning per protocol and as patient tolerates. Right-sided pleural effusion -the patient's last chest x-ray showed what appears to be a subpulmonic right pleural effusion with elevation of the hemidiaphragm.  CT scan might be considered to determine how much fluid may be there. Tracheostomy-#6 Shiley remains in place.  Continue with trach care per protocol.  Healthcare associated pneumonia -patient apparently had Pseudomonas. culture was felt to be due to colonization ,we will continue to monitor. Sickle cell trait -appropriate pain management.   I have personally seen and evaluated the patient, evaluated laboratory and imaging results, formulated the assessment and plan and placed orders. The Patient requires  high complexity decision making with multiple systems involvement.  Rounds were done with the Respiratory Therapy Director and Staff therapists and discussed with nursing staff also.  Allyne Gee, MD [facility] Pulmonary Critical Care Medicine Sleep Medicine

## 2022-04-14 LAB — FUNGUS CULTURE RESULT

## 2022-04-14 LAB — FUNGUS CULTURE WITH STAIN

## 2022-04-14 LAB — FUNGAL ORGANISM REFLEX

## 2022-04-14 NOTE — Progress Notes (Addendum)
Pulmonary [place]   PULMONARY CRITICAL CARE SERVICE  PROGRESS NOTE     Sheila Fernandez  ENI:[id number]  DOB: Nov 03, 1950   DOA: 04/08/2022  Referring Physician: Satira Sark, MD  HPI: Sheila Fernandez is a 71 y.o. female being followed for ventilator/airway/oxygen weaning Acute on Chronic Respiratory Failure.  Patient seen lying in bed, currently on ATC 28%.  Was able to do only 2 out of 4-hour ATC goal yesterday.  We will reattempt 4-hour ATC goal today.  Family at bedside.  No acute distress, no acute overnight events.  Medications: Reviewed on Rounds  Physical Exam:  Vitals: Temp 97.3, pulse 80, respirations 27, BP 131/71, SPO2 97  Ventilator Settings ATC 28%  General: Comfortable at this time Neck: supple Cardiovascular: no malignant arrhythmias Respiratory: Right lung coarse, left lung clear Skin: no rash seen on limited exam Musculoskeletal: No gross abnormality Psychiatric:unable to assess Neurologic:no involuntary movements         Lab Data:   Basic Metabolic Panel: Recent Labs  Lab 04/08/22 0336 04/09/22 0555  NA 143 145  K 3.9 3.5  CL 102 104  CO2 33* 33*  GLUCOSE 119* 112*  BUN 35* 34*  CREATININE 0.70 0.71  CALCIUM 9.3 9.7  MG  --  2.0  PHOS  --  3.5    ABG: No results for input(s): "PHART", "PCO2ART", "PO2ART", "HCO3", "O2SAT" in the last 168 hours.  Liver Function Tests: Recent Labs  Lab 04/09/22 0555  AST 20  ALT 42  ALKPHOS 96  BILITOT 0.4  PROT 6.5  ALBUMIN 2.6*   No results for input(s): "LIPASE", "AMYLASE" in the last 168 hours. No results for input(s): "AMMONIA" in the last 168 hours.  CBC: Recent Labs  Lab 04/08/22 0336 04/09/22 0555 04/11/22 0623  WBC 9.6 7.1 6.0  NEUTROABS 6.8  --   --   HGB 7.0* 7.3* 9.5*  HCT 22.0* 23.1* 30.6*  MCV 90.2 88.8 89.2  PLT 271 324 347    Cardiac Enzymes: No results for input(s): "CKTOTAL", "CKMB", "CKMBINDEX", "TROPONINI" in  the last 168 hours.  BNP (last 3 results) Recent Labs    04/08/22 0336  BNP 51.6    ProBNP (last 3 results) No results for input(s): "PROBNP" in the last 8760 hours.  Radiological Exams: No results found.  Assessment/Plan Active Problems:   Sickle cell trait (HCC)   Acute on chronic respiratory failure with hypoxia (HCC)   Pleural effusion, right   Tracheostomy status (HCC)   Healthcare-associated pneumonia   Acute on chronic respiratory failure with hypoxia-was able to complete only 2 out of 4 hours ATC goal yesterday, will reattempt 4-hour ATC goal today with 12 hours of pressure support.  Continue weaning per protocol and as patient tolerates. Right-sided pleural effusion -the patient's last chest x-ray showed what appears to be a subpulmonic right pleural effusion with elevation of the hemidiaphragm.  CT scan might be considered to determine how much fluid may be there. Tracheostomy-#6 Shiley remains in place.  Continue with trach care per protocol.  Healthcare associated pneumonia -patient apparently had Pseudomonas. culture was felt to be due to colonization ,we will continue to monitor. Sickle cell trait -appropriate pain management.   I have personally seen and evaluated the patient, evaluated laboratory and imaging results, formulated the assessment and plan and placed orders. The Patient requires high complexity decision making with multiple systems involvement.  Rounds were done with the Respiratory Therapy Director and Staff therapists  and discussed with nursing staff also.  Allyne Gee, MD [facility] Pulmonary Critical Care Medicine Sleep Medicine

## 2022-04-15 NOTE — Progress Notes (Cosign Needed)
Pulmonary [place]   PULMONARY CRITICAL CARE SERVICE  PROGRESS NOTE     Sheila Fernandez  [id number]  DOB: 03-12-1951   DOA: 04/08/2022  Referring Physician: Satira Sark, MD  HPI: Sheila Fernandez is a 71 y.o. female being followed for ventilator/airway/oxygen weaning Acute on Chronic Respiratory Failure.  Patient seen lying in bed, was successful with her 4-hour ATC goal yesterday.  We will move forward with an 8-hour ATC goal and 8-hour pressure support goal today.  No acute distress, no acute overnight events.   Medications: Reviewed on Rounds  Physical Exam:  Vitals: Temp 97.2, pulse 71, respirations 10, BP 149/84, SPO2 100%  Ventilator Settings pressure support 12/5 FiO2 28%  General: Comfortable at this time Neck: supple Cardiovascular: no malignant arrhythmias Respiratory: Bilaterally diminished Skin: no rash seen on limited exam Musculoskeletal: No gross abnormality Psychiatric:unable to assess Neurologic:no involuntary movements         Lab Data:   Basic Metabolic Panel: Recent Labs  Lab 04/09/22 0555  NA 145  K 3.5  CL 104  CO2 33*  GLUCOSE 112*  BUN 34*  CREATININE 0.71  CALCIUM 9.7  MG 2.0  PHOS 3.5    ABG: No results for input(s): "PHART", "PCO2ART", "PO2ART", "HCO3", "O2SAT" in the last 168 hours.  Liver Function Tests: Recent Labs  Lab 04/09/22 0555  AST 20  ALT 42  ALKPHOS 96  BILITOT 0.4  PROT 6.5  ALBUMIN 2.6*   No results for input(s): "LIPASE", "AMYLASE" in the last 168 hours. No results for input(s): "AMMONIA" in the last 168 hours.  CBC: Recent Labs  Lab 04/09/22 0555 04/11/22 0623  WBC 7.1 6.0  HGB 7.3* 9.5*  HCT 23.1* 30.6*  MCV 88.8 89.2  PLT 324 347    Cardiac Enzymes: No results for input(s): "CKTOTAL", "CKMB", "CKMBINDEX", "TROPONINI" in the last 168 hours.  BNP (last 3 results) Recent Labs    04/08/22 0336  BNP 51.6    ProBNP (last 3  results) No results for input(s): "PROBNP" in the last 8760 hours.  Radiological Exams: No results found.  Assessment/Plan Active Problems:   Sickle cell trait (HCC)   Acute on chronic respiratory failure with hypoxia (HCC)   Pleural effusion, right   Tracheostomy status (HCC)   Healthcare-associated pneumonia   Acute on chronic respiratory failure with hypoxia-was able to complete 4 hours ATC goal yesterday. will attempt 8-hour ATC goal today with 8 hours of pressure support.  Continue weaning per protocol and as patient tolerates. Right-sided pleural effusion -the patient's last chest x-ray showed what appears to be a subpulmonic right pleural effusion with elevation of the hemidiaphragm.  CT scan might be considered to determine how much fluid may be there. Tracheostomy-#6 Shiley remains in place.  Continue with trach care per protocol.  Healthcare associated pneumonia -patient apparently had Pseudomonas. culture was felt to be due to colonization ,we will continue to monitor. Sickle cell trait -appropriate pain management.   I have personally seen and evaluated the patient, evaluated laboratory and imaging results, formulated the assessment and plan and placed orders. The Patient requires high complexity decision making with multiple systems involvement.  Rounds were done with the Respiratory Therapy Director and Staff therapists and discussed with nursing staff also.  Allyne Gee, MD [facility] Pulmonary Critical Care Medicine Sleep Medicine

## 2022-04-16 NOTE — Progress Notes (Addendum)
Pulmonary [place]   PULMONARY CRITICAL CARE SERVICE  PROGRESS NOTE     Sheila Fernandez  KPT:[id number]  DOB: [birth year]   DOA: 04/08/2022  Referring Physician: Satira Sark, MD  HPI: Sheila Fernandez is a 70 y.o. female being followed for ventilator/airway/oxygen weaning Acute on Chronic Respiratory Failure.  Patient seen lying in bed, family at bedside.  Currently on pressure support.  Was able to complete 8 hours of ATC yesterday.  We will move forward with a 12-hour ATC goal today.  No acute distress, no acute overnight events.  Medications: Reviewed on Rounds  Physical Exam:  Vitals: Temp 96.8, pulse 70, respirations 22, BP 137/80, SPO2 97%  Ventilator Settings pressure support 12/5, FiO2 28%  General: Comfortable at this time Neck: supple Cardiovascular: no malignant arrhythmias Respiratory: Bilaterally diminished Skin: no rash seen on limited exam Musculoskeletal: No gross abnormality Psychiatric:unable to assess Neurologic:no involuntary movements         Lab Data:   Basic Metabolic Panel: No results for input(s): "NA", "K", "CL", "CO2", "GLUCOSE", "BUN", "CREATININE", "CALCIUM", "MG", "PHOS" in the last 168 hours.  ABG: No results for input(s): "PHART", "PCO2ART", "PO2ART", "HCO3", "O2SAT" in the last 168 hours.  Liver Function Tests: No results for input(s): "AST", "ALT", "ALKPHOS", "BILITOT", "PROT", "ALBUMIN" in the last 168 hours. No results for input(s): "LIPASE", "AMYLASE" in the last 168 hours. No results for input(s): "AMMONIA" in the last 168 hours.  CBC: Recent Labs  Lab 04/11/22 0623  WBC 6.0  HGB 9.5*  HCT 30.6*  MCV 89.2  PLT 347    Cardiac Enzymes: No results for input(s): "CKTOTAL", "CKMB", "CKMBINDEX", "TROPONINI" in the last 168 hours.  BNP (last 3 results) Recent Labs    04/08/22 0336  BNP 51.6    ProBNP (last 3 results) No results for input(s): "PROBNP" in the last  8760 hours.  Radiological Exams: No results found.  Assessment/Plan Active Problems:   Sickle cell trait (HCC)   Acute on chronic respiratory failure with hypoxia (HCC)   Pleural effusion, right   Tracheostomy status (HCC)   Healthcare-associated pneumonia   Acute on chronic respiratory failure with hypoxia-was able to complete 8 hours ATC goal yesterday. will attempt 12-hour ATC goal today with 4 hours of pressure support.  Continue weaning per protocol and as patient tolerates. Right-sided pleural effusion -the patient's last chest x-ray showed what appears to be a subpulmonic right pleural effusion with elevation of the hemidiaphragm.  CT scan might be considered to determine how much fluid may be there. Tracheostomy-#6 Shiley remains in place.  Continue with trach care per protocol.  Healthcare associated pneumonia -patient apparently had Pseudomonas. culture was felt to be due to colonization ,we will continue to monitor. Sickle cell trait -appropriate pain management.   I have personally seen and evaluated the patient, evaluated laboratory and imaging results, formulated the assessment and plan and placed orders. The Patient requires high complexity decision making with multiple systems involvement.  Rounds were done with the Respiratory Therapy Director and Staff therapists and discussed with nursing staff also.  Allyne Gee, MD [facility] Pulmonary Critical Care Medicine Sleep Medicine

## 2022-04-17 LAB — CBC
HCT: 33.9 % — ABNORMAL LOW (ref 36.0–46.0)
Hemoglobin: 10.8 g/dL — ABNORMAL LOW (ref 12.0–15.0)
MCH: 28.1 pg (ref 26.0–34.0)
MCHC: 31.9 g/dL (ref 30.0–36.0)
MCV: 88.3 fL (ref 80.0–100.0)
Platelets: 210 10*3/uL (ref 150–400)
RBC: 3.84 MIL/uL — ABNORMAL LOW (ref 3.87–5.11)
RDW: 15.6 % — ABNORMAL HIGH (ref 11.5–15.5)
WBC: 4 10*3/uL (ref 4.0–10.5)
nRBC: 0 % (ref 0.0–0.2)

## 2022-04-17 LAB — COMPREHENSIVE METABOLIC PANEL
ALT: 18 U/L (ref 0–44)
AST: 16 U/L (ref 15–41)
Albumin: 3 g/dL — ABNORMAL LOW (ref 3.5–5.0)
Alkaline Phosphatase: 71 U/L (ref 38–126)
Anion gap: 10 (ref 5–15)
BUN: 42 mg/dL — ABNORMAL HIGH (ref 8–23)
CO2: 32 mmol/L (ref 22–32)
Calcium: 10.2 mg/dL (ref 8.9–10.3)
Chloride: 108 mmol/L (ref 98–111)
Creatinine, Ser: 0.78 mg/dL (ref 0.44–1.00)
GFR, Estimated: >60 mL/min (ref >60)
Glucose, Bld: 112 mg/dL — ABNORMAL HIGH (ref 70–99)
Potassium: 4.8 mmol/L (ref 3.5–5.1)
Sodium: 150 mmol/L — ABNORMAL HIGH (ref 135–145)
Total Bilirubin: 0.9 mg/dL (ref 0.3–1.2)
Total Protein: 6.9 g/dL (ref 6.5–8.1)

## 2022-04-17 LAB — MAGNESIUM: Magnesium: 2.2 mg/dL (ref 1.7–2.4)

## 2022-04-17 LAB — PHOSPHORUS: Phosphorus: 3.7 mg/dL (ref 2.5–4.6)

## 2022-04-17 NOTE — Progress Notes (Signed)
Pulmonary [place]   PULMONARY CRITICAL CARE SERVICE  PROGRESS NOTE     Sheila Fernandez  CBS:[id number]  DOB: Feb 08, 1951   DOA: 04/08/2022  Referring Physician: Satira Sark, MD  HPI: Sheila Fernandez is a 71 y.o. female being followed for ventilator/airway/oxygen weaning Acute on Chronic Respiratory Failure.  Patient is comfortable at this time without distress has been on the T collar 28% FiO2 with a goal of 16 hours  Medications: Reviewed on Rounds  Physical Exam:  Vitals: Temperature is 96.9 pulse of 81 respiratory rate is 34 blood pressure 139/81 saturations 98%  Ventilator Settings T collar FiO2 28%  General: Comfortable at this time Neck: supple Cardiovascular: no malignant arrhythmias Respiratory: Scattered coarse rhonchi expansion is equal Skin: no rash seen on limited exam Musculoskeletal: No gross abnormality Psychiatric:unable to assess Neurologic:no involuntary movements         Lab Data:   Basic Metabolic Panel: Recent Labs  Lab 04/17/22 0514  NA 150*  K 4.8  CL 108  CO2 32  GLUCOSE 112*  BUN 42*  CREATININE 0.78  CALCIUM 10.2  MG 2.2  PHOS 3.7    ABG: No results for input(s): "PHART", "PCO2ART", "PO2ART", "HCO3", "O2SAT" in the last 168 hours.  Liver Function Tests: Recent Labs  Lab 04/17/22 0514  AST 16  ALT 18  ALKPHOS 71  BILITOT 0.9  PROT 6.9  ALBUMIN 3.0*   No results for input(s): "LIPASE", "AMYLASE" in the last 168 hours. No results for input(s): "AMMONIA" in the last 168 hours.  CBC: Recent Labs  Lab 04/11/22 0623 04/17/22 0514  WBC 6.0 4.0  HGB 9.5* 10.8*  HCT 30.6* 33.9*  MCV 89.2 88.3  PLT 347 210    Cardiac Enzymes: No results for input(s): "CKTOTAL", "CKMB", "CKMBINDEX", "TROPONINI" in the last 168 hours.  BNP (last 3 results) Recent Labs    04/08/22 0336  BNP 51.6    ProBNP (last 3 results) No results for input(s): "PROBNP" in the last 8760  hours.  Radiological Exams: No results found.  Assessment/Plan Active Problems:   Sickle cell trait (HCC)   Acute on chronic respiratory failure with hypoxia (HCC)   Pleural effusion, right   Tracheostomy status (HCC)   Healthcare-associated pneumonia   Acute on chronic respiratory failure with hypoxia patient is going to continue with the weaning protocol going for 16-hour Sickle cell trait no change at baseline Pleural effusion follow-up x-rays as warranted Tracheostomy remains in place Healthcare associated pneumonia has been treated slowly improving   I have personally seen and evaluated the patient, evaluated laboratory and imaging results, formulated the assessment and plan and placed orders. The Patient requires high complexity decision making with multiple systems involvement.  Rounds were done with the Respiratory Therapy Director and Staff therapists and discussed with nursing staff also.  Allyne Gee, MD [facility] Pulmonary Critical Care Medicine Sleep Medicine

## 2022-04-18 LAB — BASIC METABOLIC PANEL
Anion gap: 7 (ref 5–15)
BUN: 41 mg/dL — ABNORMAL HIGH (ref 8–23)
CO2: 30 mmol/L (ref 22–32)
Calcium: 9.9 mg/dL (ref 8.9–10.3)
Chloride: 111 mmol/L (ref 98–111)
Creatinine, Ser: 0.83 mg/dL (ref 0.44–1.00)
GFR, Estimated: >60 mL/min (ref >60)
Glucose, Bld: 103 mg/dL — ABNORMAL HIGH (ref 70–99)
Potassium: 4.6 mmol/L (ref 3.5–5.1)
Sodium: 148 mmol/L — ABNORMAL HIGH (ref 135–145)

## 2022-04-18 LAB — CREATININE, URINE, RANDOM: Creatinine, Urine: 71 mg/dL

## 2022-04-18 LAB — TSH: TSH: 1.597 u[IU]/mL (ref 0.350–4.500)

## 2022-04-18 LAB — SODIUM, URINE, RANDOM: Sodium, Ur: 35 mmol/L

## 2022-04-18 NOTE — Progress Notes (Signed)
Pulmonary [place]   PULMONARY CRITICAL CARE SERVICE  PROGRESS NOTE     ALAYSHA JEFCOAT  GEZ:[id number]  DOB: April 26, 1951   DOA: 04/08/2022  Referring Physician: Satira Sark, MD  HPI: Sanskriti MYANA SCHLUP is a 71 y.o. female being followed for ventilator/airway/oxygen weaning Acute on Chronic Respiratory Failure.  Patient is off the ventilator and has been on T collar on 28% FiO2 the goal is for 20 hours  Medications: Reviewed on Rounds  Physical Exam:  Vitals: Temperature is 96.7 pulse 67 respiratory rate is 23 blood pressure 103/63 saturations 100%  Ventilator Settings on T collar FiO2 is 28%  General: Comfortable at this time Neck: supple Cardiovascular: no malignant arrhythmias Respiratory: No rhonchi very coarse breath sounds Skin: no rash seen on limited exam Musculoskeletal: No gross abnormality Psychiatric:unable to assess Neurologic:no involuntary movements         Lab Data:   Basic Metabolic Panel: Recent Labs  Lab 04/17/22 0514 04/18/22 0643  NA 150* 148*  K 4.8 4.6  CL 108 111  CO2 32 30  GLUCOSE 112* 103*  BUN 42* 41*  CREATININE 0.78 0.83  CALCIUM 10.2 9.9  MG 2.2  --   PHOS 3.7  --     ABG: No results for input(s): "PHART", "PCO2ART", "PO2ART", "HCO3", "O2SAT" in the last 168 hours.  Liver Function Tests: Recent Labs  Lab 04/17/22 0514  AST 16  ALT 18  ALKPHOS 71  BILITOT 0.9  PROT 6.9  ALBUMIN 3.0*   No results for input(s): "LIPASE", "AMYLASE" in the last 168 hours. No results for input(s): "AMMONIA" in the last 168 hours.  CBC: Recent Labs  Lab 04/17/22 0514  WBC 4.0  HGB 10.8*  HCT 33.9*  MCV 88.3  PLT 210    Cardiac Enzymes: No results for input(s): "CKTOTAL", "CKMB", "CKMBINDEX", "TROPONINI" in the last 168 hours.  BNP (last 3 results) Recent Labs    04/08/22 0336  BNP 51.6    ProBNP (last 3 results) No results for input(s): "PROBNP" in the last 8760  hours.  Radiological Exams: No results found.  Assessment/Plan Active Problems:   Sickle cell trait (HCC)   Acute on chronic respiratory failure with hypoxia (HCC)   Pleural effusion, right   Tracheostomy status (HCC)   Healthcare-associated pneumonia   Acute on chronic respiratory failure with hypoxia we will continue with T collar trials goal is 20 hours continue to advance as tolerated Sickle cell trait no change we will continue to follow along closely Tracheostomy remains in place Healthcare associated pneumonia has been treated we will continue to follow along Pleural effusion supportive care follow-up x-rays   I have personally seen and evaluated the patient, evaluated laboratory and imaging results, formulated the assessment and plan and placed orders. The Patient requires high complexity decision making with multiple systems involvement.  Rounds were done with the Respiratory Therapy Director and Staff therapists and discussed with nursing staff also.  Allyne Gee, MD [facility] Pulmonary Critical Care Medicine Sleep Medicine

## 2022-04-19 NOTE — Progress Notes (Signed)
Pulmonary [place]   PULMONARY CRITICAL CARE SERVICE  PROGRESS NOTE     Sheila Fernandez  VOP:[id number]  DOB: 05/23/1951   DOA: 04/08/2022  Referring Physician: Satira Sark, MD  HPI: Sheila Fernandez is a 71 y.o. female being followed for ventilator/airway/oxygen weaning Acute on Chronic Respiratory Failure. ***  Medications: Reviewed on Rounds  Physical Exam:  Vitals: ***  Ventilator Settings ***  General: Comfortable at this time Neck: supple Cardiovascular: no malignant arrhythmias Respiratory: *** Skin: no rash seen on limited exam Musculoskeletal: No gross abnormality Psychiatric:unable to assess Neurologic:no involuntary movements         Lab Data:   Basic Metabolic Panel: Recent Labs  Lab 04/17/22 0514 04/18/22 0643  NA 150* 148*  K 4.8 4.6  CL 108 111  CO2 32 30  GLUCOSE 112* 103*  BUN 42* 41*  CREATININE 0.78 0.83  CALCIUM 10.2 9.9  MG 2.2  --   PHOS 3.7  --     ABG: No results for input(s): "PHART", "PCO2ART", "PO2ART", "HCO3", "O2SAT" in the last 168 hours.  Liver Function Tests: Recent Labs  Lab 04/17/22 0514  AST 16  ALT 18  ALKPHOS 71  BILITOT 0.9  PROT 6.9  ALBUMIN 3.0*   No results for input(s): "LIPASE", "AMYLASE" in the last 168 hours. No results for input(s): "AMMONIA" in the last 168 hours.  CBC: Recent Labs  Lab 04/17/22 0514  WBC 4.0  HGB 10.8*  HCT 33.9*  MCV 88.3  PLT 210    Cardiac Enzymes: No results for input(s): "CKTOTAL", "CKMB", "CKMBINDEX", "TROPONINI" in the last 168 hours.  BNP (last 3 results) Recent Labs    04/08/22 0336  BNP 51.6    ProBNP (last 3 results) No results for input(s): "PROBNP" in the last 8760 hours.  Radiological Exams: No results found.  Assessment/Plan Active Problems:   Sickle cell trait (HCC)   Acute on chronic respiratory failure with hypoxia (HCC)   Pleural effusion, right   Tracheostomy status (HCC)    Healthcare-associated pneumonia   *** *** *** *** ***   I have personally seen and evaluated the patient, evaluated laboratory and imaging results, formulated the assessment and plan and placed orders. The Patient requires high complexity decision making with multiple systems involvement.  Rounds were done with the Respiratory Therapy Director and Staff therapists and discussed with nursing staff also.  Allyne Gee, MD [facility] Pulmonary Critical Care Medicine Sleep Medicine

## 2022-04-20 LAB — BASIC METABOLIC PANEL
Anion gap: 11 (ref 5–15)
BUN: 37 mg/dL — ABNORMAL HIGH (ref 8–23)
CO2: 32 mmol/L (ref 22–32)
Calcium: 10.7 mg/dL — ABNORMAL HIGH (ref 8.9–10.3)
Chloride: 105 mmol/L (ref 98–111)
Creatinine, Ser: 0.65 mg/dL (ref 0.44–1.00)
GFR, Estimated: >60 mL/min (ref >60)
Glucose, Bld: 117 mg/dL — ABNORMAL HIGH (ref 70–99)
Potassium: 4.6 mmol/L (ref 3.5–5.1)
Sodium: 148 mmol/L — ABNORMAL HIGH (ref 135–145)

## 2022-04-20 LAB — BLOOD GAS, ARTERIAL
Acid-Base Excess: 14.7 mmol/L — ABNORMAL HIGH (ref 0.0–2.0)
Bicarbonate: 41.3 mmol/L — ABNORMAL HIGH (ref 20.0–28.0)
O2 Saturation: 98.5 %
Patient temperature: 37
pCO2 arterial: 58 mmHg — ABNORMAL HIGH (ref 32–48)
pH, Arterial: 7.46 — ABNORMAL HIGH (ref 7.35–7.45)
pO2, Arterial: 77 mmHg — ABNORMAL LOW (ref 83–108)

## 2022-04-20 NOTE — Progress Notes (Signed)
Pulmonary [place]   PULMONARY CRITICAL CARE SERVICE  PROGRESS NOTE     Sheila Fernandez  JOI:[id number]  DOB: 22-Jun-1951   DOA: 04/08/2022  Referring Physician: Satira Sark, MD  HPI: Sheila Fernandez is a 71 y.o. female being followed for ventilator/airway/oxygen weaning Acute on Chronic Respiratory Failure.  Patient is comfortable without distress is on T collar will be going for 48 hours  Medications: Reviewed on Rounds  Physical Exam:  Vitals: Temperature is 96 pulse 69 respiratory rate is 18 blood pressure 146/88 saturations 100%  Ventilator Settings T collar 28% FiO2  General: Comfortable at this time Neck: supple Cardiovascular: no malignant arrhythmias Respiratory: Scattered rhonchi coarse breath sounds Skin: no rash seen on limited exam Musculoskeletal: No gross abnormality Psychiatric:unable to assess Neurologic:no involuntary movements         Lab Data:   Basic Metabolic Panel: Recent Labs  Lab 04/17/22 0514 04/18/22 0643 04/20/22 0716  NA 150* 148* 148*  K 4.8 4.6 4.6  CL 108 111 105  CO2 32 30 32  GLUCOSE 112* 103* 117*  BUN 42* 41* 37*  CREATININE 0.78 0.83 0.65  CALCIUM 10.2 9.9 10.7*  MG 2.2  --   --   PHOS 3.7  --   --     ABG: No results for input(s): "PHART", "PCO2ART", "PO2ART", "HCO3", "O2SAT" in the last 168 hours.  Liver Function Tests: Recent Labs  Lab 04/17/22 0514  AST 16  ALT 18  ALKPHOS 71  BILITOT 0.9  PROT 6.9  ALBUMIN 3.0*   No results for input(s): "LIPASE", "AMYLASE" in the last 168 hours. No results for input(s): "AMMONIA" in the last 168 hours.  CBC: Recent Labs  Lab 04/17/22 0514  WBC 4.0  HGB 10.8*  HCT 33.9*  MCV 88.3  PLT 210    Cardiac Enzymes: No results for input(s): "CKTOTAL", "CKMB", "CKMBINDEX", "TROPONINI" in the last 168 hours.  BNP (last 3 results) Recent Labs    04/08/22 0336  BNP 51.6    ProBNP (last 3 results) No  results for input(s): "PROBNP" in the last 8760 hours.  Radiological Exams: No results found.  Assessment/Plan Active Problems:   Sickle cell trait (HCC)   Acute on chronic respiratory failure with hypoxia (HCC)   Pleural effusion, right   Tracheostomy status (HCC)   Healthcare-associated pneumonia   Acute on chronic respiratory failure hypoxia we will continue with the T collar trials goal of 48 hours Sickle cell trait stable Pleural effusion supportive care follow x-rays Tracheostomy remains in place Healthcare associated pneumonia has been treated with antibiotic   I have personally seen and evaluated the patient, evaluated laboratory and imaging results, formulated the assessment and plan and placed orders. The Patient requires high complexity decision making with multiple systems involvement.  Rounds were done with the Respiratory Therapy Director and Staff therapists and discussed with nursing staff also.  Allyne Gee, MD [facility] Pulmonary Critical Care Medicine Sleep Medicine

## 2022-04-21 NOTE — Progress Notes (Signed)
Pulmonary [place]   PULMONARY CRITICAL CARE SERVICE  PROGRESS NOTE     Sheila Fernandez  NLG:[id number]  DOB: Aug 13, 1951   DOA: 04/08/2022  Referring Physician: Satira Sark, MD  HPI: Sheila Fernandez is a 71 y.o. female being followed for ventilator/airway/oxygen weaning Acute on Chronic Respiratory Failure.  Patient is comfortable without distress afebrile currently on 28% FiO2 with a goal of 48 hours  Medications: Reviewed on Rounds  Physical Exam:  Vitals: Temperature 96.9 pulse of 70 respiratory is 19 blood pressure 154/89 saturations 100%  Ventilator Settings off the ventilator on T collar FiO2 28%  General: Comfortable at this time Neck: supple Cardiovascular: no malignant arrhythmias Respiratory: No rhonchi no rales are noted at this time Skin: no rash seen on limited exam Musculoskeletal: No gross abnormality Psychiatric:unable to assess Neurologic:no involuntary movements         Lab Data:   Basic Metabolic Panel: Recent Labs  Lab 04/17/22 0514 04/18/22 0643 04/20/22 0716  NA 150* 148* 148*  K 4.8 4.6 4.6  CL 108 111 105  CO2 32 30 32  GLUCOSE 112* 103* 117*  BUN 42* 41* 37*  CREATININE 0.78 0.83 0.65  CALCIUM 10.2 9.9 10.7*  MG 2.2  --   --   PHOS 3.7  --   --     ABG: Recent Labs  Lab 04/20/22 1015  PHART 7.46*  PCO2ART 58*  PO2ART 77*  HCO3 41.3*  O2SAT 98.5    Liver Function Tests: Recent Labs  Lab 04/17/22 0514  AST 16  ALT 18  ALKPHOS 71  BILITOT 0.9  PROT 6.9  ALBUMIN 3.0*   No results for input(s): "LIPASE", "AMYLASE" in the last 168 hours. No results for input(s): "AMMONIA" in the last 168 hours.  CBC: Recent Labs  Lab 04/17/22 0514  WBC 4.0  HGB 10.8*  HCT 33.9*  MCV 88.3  PLT 210    Cardiac Enzymes: No results for input(s): "CKTOTAL", "CKMB", "CKMBINDEX", "TROPONINI" in the last 168 hours.  BNP (last 3 results) Recent Labs    04/08/22 0336  BNP  51.6    ProBNP (last 3 results) No results for input(s): "PROBNP" in the last 8760 hours.  Radiological Exams: No results found.  Assessment/Plan Active Problems:   Sickle cell trait (HCC)   Acute on chronic respiratory failure with hypoxia (HCC)   Pleural effusion, right   Tracheostomy status (HCC)   Healthcare-associated pneumonia   Acute on chronic respiratory failure hypoxia we will continue to wean goal of 48 hours on T collar Sickle cell trait no change we will continue to monitor and follow along closely Tracheostomy remains in place at this time Healthcare associated pneumonia has been treated we will continue to follow along Pleural effusion follow-up x-rays as needed   I have personally seen and evaluated the patient, evaluated laboratory and imaging results, formulated the assessment and plan and placed orders. The Patient requires high complexity decision making with multiple systems involvement.  Rounds were done with the Respiratory Therapy Director and Staff therapists and discussed with nursing staff also.  Allyne Gee, MD [facility] Pulmonary Critical Care Medicine Sleep Medicine

## 2022-04-22 LAB — BASIC METABOLIC PANEL
Anion gap: 9 (ref 5–15)
BUN: 43 mg/dL — ABNORMAL HIGH (ref 8–23)
CO2: 34 mmol/L — ABNORMAL HIGH (ref 22–32)
Calcium: 10.5 mg/dL — ABNORMAL HIGH (ref 8.9–10.3)
Chloride: 106 mmol/L (ref 98–111)
Creatinine, Ser: 0.82 mg/dL (ref 0.44–1.00)
GFR, Estimated: >60 mL/min (ref >60)
Glucose, Bld: 176 mg/dL — ABNORMAL HIGH (ref 70–99)
Potassium: 3.7 mmol/L (ref 3.5–5.1)
Sodium: 149 mmol/L — ABNORMAL HIGH (ref 135–145)

## 2022-04-22 NOTE — Progress Notes (Signed)
Pulmonary [place]   PULMONARY CRITICAL CARE SERVICE  PROGRESS NOTE     Sheila Fernandez  ERD:[id number]  DOB: March 09, 1951   DOA: 04/08/2022  Referring Physician: Satira Sark, MD  HPI: Sheila Fernandez is a 71 y.o. female being followed for ventilator/airway/oxygen weaning Acute on Chronic Respiratory Failure.  Patient is currently on T collar has been on 28% FiO2 the goal is for 72 hours  Medications: Reviewed on Rounds  Physical Exam:  Vitals: Temperature is 97.3 pulse 79 respiratory 21 blood pressure 158/92 saturations 100%  Ventilator Settings on T collar goal of 72 hours  General: Comfortable at this time Neck: supple Cardiovascular: no malignant arrhythmias Respiratory: No rhonchi no rales are noted at this time Skin: no rash seen on limited exam Musculoskeletal: No gross abnormality Psychiatric:unable to assess Neurologic:no involuntary movements         Lab Data:   Basic Metabolic Panel: Recent Labs  Lab 04/17/22 0514 04/18/22 0643 04/20/22 0716 04/22/22 0229  NA 150* 148* 148* 149*  K 4.8 4.6 4.6 3.7  CL 108 111 105 106  CO2 32 30 32 34*  GLUCOSE 112* 103* 117* 176*  BUN 42* 41* 37* 43*  CREATININE 0.78 0.83 0.65 0.82  CALCIUM 10.2 9.9 10.7* 10.5*  MG 2.2  --   --   --   PHOS 3.7  --   --   --     ABG: Recent Labs  Lab 04/20/22 1015  PHART 7.46*  PCO2ART 58*  PO2ART 77*  HCO3 41.3*  O2SAT 98.5    Liver Function Tests: Recent Labs  Lab 04/17/22 0514  AST 16  ALT 18  ALKPHOS 71  BILITOT 0.9  PROT 6.9  ALBUMIN 3.0*   No results for input(s): "LIPASE", "AMYLASE" in the last 168 hours. No results for input(s): "AMMONIA" in the last 168 hours.  CBC: Recent Labs  Lab 04/17/22 0514  WBC 4.0  HGB 10.8*  HCT 33.9*  MCV 88.3  PLT 210    Cardiac Enzymes: No results for input(s): "CKTOTAL", "CKMB", "CKMBINDEX", "TROPONINI" in the last 168 hours.  BNP (last 3  results) Recent Labs    04/08/22 0336  BNP 51.6    ProBNP (last 3 results) No results for input(s): "PROBNP" in the last 8760 hours.  Radiological Exams: No results found.  Assessment/Plan Active Problems:   Sickle cell trait (HCC)   Acute on chronic respiratory failure with hypoxia (HCC)   Pleural effusion, right   Tracheostomy status (HCC)   Healthcare-associated pneumonia   Acute on chronic respiratory failure hypoxia plan is going to be to continue with the T collar wean continue secretion management pulmonary toilet. Sickle cell trait no change we will continue to follow along Tracheostomy remains in place Healthcare associated pneumonia has been treated we will continue to follow along Pleural effusion supportive care   I have personally seen and evaluated the patient, evaluated laboratory and imaging results, formulated the assessment and plan and placed orders. The Patient requires high complexity decision making with multiple systems involvement.  Rounds were done with the Respiratory Therapy Director and Staff therapists and discussed with nursing staff also.  Allyne Gee, MD [facility] Pulmonary Critical Care Medicine Sleep Medicine

## 2022-04-22 NOTE — Progress Notes (Deleted)
Pulmonary [place]   PULMONARY CRITICAL CARE SERVICE  PROGRESS NOTE     Sheila Fernandez  MLJ:[id number]  DOB: 03-21-1951   DOA: 04/08/2022  Referring Physician: Satira Sark, MD  HPI: Sheila Fernandez is a 71 y.o. female being followed for ventilator/airway/oxygen weaning Acute on Chronic Respiratory Failure.  Patient is on 40% FiO2 with a goal of 4 hours  Medications: Reviewed on Rounds  Physical Exam:  Vitals: Temperature is 97.2 pulse 87 respiratory is 18 blood pressure 122/59 saturations 94%  Ventilator Settings on T collar FiO2 is 40%  General: Comfortable at this time Neck: supple Cardiovascular: no malignant arrhythmias Respiratory: Scattered rhonchi expansion is equal Skin: no rash seen on limited exam Musculoskeletal: No gross abnormality Psychiatric:unable to assess Neurologic:no involuntary movements         Lab Data:   Basic Metabolic Panel: Recent Labs  Lab 04/17/22 0514 04/18/22 0643 04/20/22 0716 04/22/22 0229  NA 150* 148* 148* 149*  K 4.8 4.6 4.6 3.7  CL 108 111 105 106  CO2 32 30 32 34*  GLUCOSE 112* 103* 117* 176*  BUN 42* 41* 37* 43*  CREATININE 0.78 0.83 0.65 0.82  CALCIUM 10.2 9.9 10.7* 10.5*  MG 2.2  --   --   --   PHOS 3.7  --   --   --     ABG: Recent Labs  Lab 04/20/22 1015  PHART 7.46*  PCO2ART 58*  PO2ART 77*  HCO3 41.3*  O2SAT 98.5    Liver Function Tests: Recent Labs  Lab 04/17/22 0514  AST 16  ALT 18  ALKPHOS 71  BILITOT 0.9  PROT 6.9  ALBUMIN 3.0*   No results for input(s): "LIPASE", "AMYLASE" in the last 168 hours. No results for input(s): "AMMONIA" in the last 168 hours.  CBC: Recent Labs  Lab 04/17/22 0514  WBC 4.0  HGB 10.8*  HCT 33.9*  MCV 88.3  PLT 210    Cardiac Enzymes: No results for input(s): "CKTOTAL", "CKMB", "CKMBINDEX", "TROPONINI" in the last 168 hours.  BNP (last 3 results) Recent Labs    04/08/22 0336  BNP 51.6     ProBNP (last 3 results) No results for input(s): "PROBNP" in the last 8760 hours.  Radiological Exams: No results found.  Assessment/Plan Active Problems:   Sickle cell trait (HCC)   Acute on chronic respiratory failure with hypoxia (HCC)   Pleural effusion, right   Tracheostomy status (HCC)   Healthcare-associated pneumonia   Acute on chronic respiratory failure with hypoxia we will continue with T collar trials patient currently is on 40% FiO2 today's goal is for 4 hours will advance as tolerated Sickle cell trait supportive care Pleural effusion follow x-rays as needed Tracheostomy status remains in place Healthcare associated pneumonia has been treated clinically improving   I have personally seen and evaluated the patient, evaluated laboratory and imaging results, formulated the assessment and plan and placed orders. The Patient requires high complexity decision making with multiple systems involvement.  Rounds were done with the Respiratory Therapy Director and Staff therapists and discussed with nursing staff also.  Allyne Gee, MD [facility] Pulmonary Critical Care Medicine Sleep Medicine

## 2022-04-23 LAB — BASIC METABOLIC PANEL
Anion gap: 6 (ref 5–15)
BUN: 36 mg/dL — ABNORMAL HIGH (ref 8–23)
CO2: 35 mmol/L — ABNORMAL HIGH (ref 22–32)
Calcium: 10.6 mg/dL — ABNORMAL HIGH (ref 8.9–10.3)
Chloride: 110 mmol/L (ref 98–111)
Creatinine, Ser: 0.72 mg/dL (ref 0.44–1.00)
GFR, Estimated: >60 mL/min (ref >60)
Glucose, Bld: 148 mg/dL — ABNORMAL HIGH (ref 70–99)
Potassium: 3.9 mmol/L (ref 3.5–5.1)
Sodium: 151 mmol/L — ABNORMAL HIGH (ref 135–145)

## 2022-04-23 NOTE — Progress Notes (Signed)
Pulmonary [place]   PULMONARY CRITICAL CARE SERVICE  PROGRESS NOTE     Sheila Fernandez  HAL:[id number]  DOB: 08-30-1951   DOA: 04/08/2022  Referring Physician: Satira Sark, MD  HPI: Sheila Fernandez is a 71 y.o. female being followed for ventilator/airway/oxygen weaning Acute on Chronic Respiratory Failure.  Patient is on T collar currently has been on 28% FiO2 with good saturation  Medications: Reviewed on Rounds  Physical Exam:  Vitals: Temperature is 98.2 pulse of 91 respiratory 26 blood pressure is 163/95 saturations 100%  Ventilator Settings off the ventilator on T collar  General: Comfortable at this time Neck: supple Cardiovascular: no malignant arrhythmias Respiratory: No rhonchi no rales are noted at this time Skin: no rash seen on limited exam Musculoskeletal: No gross abnormality Psychiatric:unable to assess Neurologic:no involuntary movements         Lab Data:   Basic Metabolic Panel: Recent Labs  Lab 04/17/22 0514 04/18/22 0643 04/20/22 0716 04/22/22 0229 04/23/22 0222  NA 150* 148* 148* 149* 151*  K 4.8 4.6 4.6 3.7 3.9  CL 108 111 105 106 110  CO2 32 30 32 34* 35*  GLUCOSE 112* 103* 117* 176* 148*  BUN 42* 41* 37* 43* 36*  CREATININE 0.78 0.83 0.65 0.82 0.72  CALCIUM 10.2 9.9 10.7* 10.5* 10.6*  MG 2.2  --   --   --   --   PHOS 3.7  --   --   --   --     ABG: Recent Labs  Lab 04/20/22 1015  PHART 7.46*  PCO2ART 58*  PO2ART 77*  HCO3 41.3*  O2SAT 98.5    Liver Function Tests: Recent Labs  Lab 04/17/22 0514  AST 16  ALT 18  ALKPHOS 71  BILITOT 0.9  PROT 6.9  ALBUMIN 3.0*   No results for input(s): "LIPASE", "AMYLASE" in the last 168 hours. No results for input(s): "AMMONIA" in the last 168 hours.  CBC: Recent Labs  Lab 04/17/22 0514  WBC 4.0  HGB 10.8*  HCT 33.9*  MCV 88.3  PLT 210    Cardiac Enzymes: No results for input(s): "CKTOTAL", "CKMB", "CKMBINDEX",  "TROPONINI" in the last 168 hours.  BNP (last 3 results) Recent Labs    04/08/22 0336  BNP 51.6    ProBNP (last 3 results) No results for input(s): "PROBNP" in the last 8760 hours.  Radiological Exams: No results found.  Assessment/Plan Active Problems:   Sickle cell trait (HCC)   Acute on chronic respiratory failure with hypoxia (HCC)   Pleural effusion, right   Tracheostomy status (HCC)   Healthcare-associated pneumonia   Acute on chronic respiratory failure with hypoxia patient is on T collar seems to be doing well resting patient is currently on 28% FiO2 Sickle cell trait appears to be under control Pleural effusion following x-rays Tracheostomy remains in place Healthcare associated pneumonia has been treated   I have personally seen and evaluated the patient, evaluated laboratory and imaging results, formulated the assessment and plan and placed orders. The Patient requires high complexity decision making with multiple systems involvement.  Rounds were done with the Respiratory Therapy Director and Staff therapists and discussed with nursing staff also.  Allyne Gee, MD [facility] Pulmonary Critical Care Medicine Sleep Medicine

## 2022-04-24 NOTE — Progress Notes (Addendum)
Pulmonary [place]   PULMONARY CRITICAL CARE SERVICE  PROGRESS NOTE     Sheila Fernandez  LDJ:[id number]  DOB: 21-Feb-1951   DOA: 04/08/2022  Referring Physician: Satira Sark, MD  HPI: Sheila Fernandez is a 71 y.o. female being followed for ventilator/airway/oxygen weaning Acute on Chronic Respiratory Failure.  Patient seen lying in bed, currently on ATC 20% with PMV in place.  Patient does remain very anxious which is made PMV use challenging.  She was able to complete 8 approximately 7 hours of ATC use yesterday.  Today she is complaining of a sore in her mouth.  No acute distress no acute distress, no acute overnight events. Medications: Reviewed on Rounds  Physical Exam:  Vitals: Temp 97.8, pulse 98, respirations 34, BP 116/73, SPO2 96%  Ventilator Settings ATC 28% with PMV  General: Comfortable at this time Neck: supple Cardiovascular: no malignant arrhythmias Respiratory: Bilaterally clear Skin: no rash seen on limited exam Musculoskeletal: No gross abnormality Psychiatric:unable to assess Neurologic:no involuntary movements         Lab Data:   Basic Metabolic Panel: Recent Labs  Lab 04/18/22 0643 04/20/22 0716 04/22/22 0229 04/23/22 0222  NA 148* 148* 149* 151*  K 4.6 4.6 3.7 3.9  CL 111 105 106 110  CO2 30 32 34* 35*  GLUCOSE 103* 117* 176* 148*  BUN 41* 37* 43* 36*  CREATININE 0.83 0.65 0.82 0.72  CALCIUM 9.9 10.7* 10.5* 10.6*    ABG: Recent Labs  Lab 04/20/22 1015  PHART 7.46*  PCO2ART 58*  PO2ART 77*  HCO3 41.3*  O2SAT 98.5    Liver Function Tests: No results for input(s): "AST", "ALT", "ALKPHOS", "BILITOT", "PROT", "ALBUMIN" in the last 168 hours. No results for input(s): "LIPASE", "AMYLASE" in the last 168 hours. No results for input(s): "AMMONIA" in the last 168 hours.  CBC: No results for input(s): "WBC", "NEUTROABS", "HGB", "HCT", "MCV", "PLT" in the last 168 hours.  Cardiac  Enzymes: No results for input(s): "CKTOTAL", "CKMB", "CKMBINDEX", "TROPONINI" in the last 168 hours.  BNP (last 3 results) Recent Labs    04/08/22 0336  BNP 51.6    ProBNP (last 3 results) No results for input(s): "PROBNP" in the last 8760 hours.  Radiological Exams: No results found.  Assessment/Plan Active Problems:   Sickle cell trait (HCC)   Acute on chronic respiratory failure with hypoxia (HCC)   Pleural effusion, right   Tracheostomy status ([place])   Healthcare-associated pneumonia   Acute on chronic respiratory failure with hypoxia-remains on ATC 20% with PMV placed.  She does have mild anxiety preventing the use of PMV for longer times.  She was however to tolerate it for about 7 hours yesterday.  We will continue PMV as tolerated.  Continue weaning per protocol.  Continue with supportive care. Sickle cell trait -appears to be under control.  Continue with medical management. Pleural effusion-continue to follow-up with x-rays. Tracheostomy-remains in place and is stable.  Continue with trach care per protocol. Healthcare associated pneumonia-has been treated and resolved.  Continue with supportive care.   I have personally seen and evaluated the patient, evaluated laboratory and imaging results, formulated the assessment and plan and placed orders. The Patient requires high complexity decision making with multiple systems involvement.  Rounds were done with the Respiratory Therapy Director and Staff therapists and discussed with nursing staff also.  Allyne Gee, MD [facility] Pulmonary Critical Care Medicine Sleep Medicine

## 2022-04-25 ENCOUNTER — Institutional Professional Consult (permissible substitution) (HOSPITAL_COMMUNITY): Payer: Medicare Other

## 2022-04-25 LAB — BASIC METABOLIC PANEL
Anion gap: 6 (ref 5–15)
BUN: 36 mg/dL — ABNORMAL HIGH (ref 8–23)
CO2: 33 mmol/L — ABNORMAL HIGH (ref 22–32)
Calcium: 10.5 mg/dL — ABNORMAL HIGH (ref 8.9–10.3)
Chloride: 106 mmol/L (ref 98–111)
Creatinine, Ser: 0.69 mg/dL (ref 0.44–1.00)
GFR, Estimated: >60 mL/min (ref >60)
Glucose, Bld: 142 mg/dL — ABNORMAL HIGH (ref 70–99)
Potassium: 4.1 mmol/L (ref 3.5–5.1)
Sodium: 145 mmol/L (ref 135–145)

## 2022-04-25 LAB — BLOOD GAS, ARTERIAL
Acid-Base Excess: 8.1 mmol/L — ABNORMAL HIGH (ref 0.0–2.0)
Bicarbonate: 35.1 mmol/L — ABNORMAL HIGH (ref 20.0–28.0)
O2 Saturation: 100 %
Patient temperature: 36.7
pCO2 arterial: 57 mmHg — ABNORMAL HIGH (ref 32–48)
pH, Arterial: 7.39 (ref 7.35–7.45)
pO2, Arterial: 111 mmHg — ABNORMAL HIGH (ref 83–108)

## 2022-04-25 LAB — CBC
HCT: 36.2 % (ref 36.0–46.0)
Hemoglobin: 10.9 g/dL — ABNORMAL LOW (ref 12.0–15.0)
MCH: 27 pg (ref 26.0–34.0)
MCHC: 30.1 g/dL (ref 30.0–36.0)
MCV: 89.8 fL (ref 80.0–100.0)
Platelets: 163 10*3/uL (ref 150–400)
RBC: 4.03 MIL/uL (ref 3.87–5.11)
RDW: 15.5 % (ref 11.5–15.5)
WBC: 10.2 10*3/uL (ref 4.0–10.5)
nRBC: 0 % (ref 0.0–0.2)

## 2022-04-25 LAB — ACID FAST CULTURE WITH REFLEXED SENSITIVITIES (MYCOBACTERIA): Acid Fast Culture: NEGATIVE

## 2022-04-25 LAB — MAGNESIUM: Magnesium: 2.3 mg/dL (ref 1.7–2.4)

## 2022-04-26 NOTE — Progress Notes (Cosign Needed)
Pulmonary [place]   PULMONARY CRITICAL CARE SERVICE  PROGRESS NOTE     JENNEL MARA  WUJ:[id number]  DOB: 1950/10/28   DOA: 04/08/2022  Referring Physician: Satira Sark, MD  HPI: Sheila Fernandez is a 71 y.o. female being followed for ventilator/airway/oxygen weaning Acute on Chronic Respiratory Failure.  Patient seen lying in bed, currently on full support.  She did have a rapid response last night due to a pulmonary plug.  We are can let her rest today and will reattempting weaning protocol tomorrow.  No acute distress.  Medications: Reviewed on Rounds  Physical Exam:  Vitals: Temp 98.7, pulse 80, respirations 31, BP 141/84, SPO2 98%  Ventilator Settings AC VC plus, FiO2 40%, tidal volume 480, rate 12, PEEP 5   General: Comfortable at this time Neck: supple Cardiovascular: no malignant arrhythmias Respiratory: Bilaterally clear Skin: no rash seen on limited exam Musculoskeletal: No gross abnormality Psychiatric:unable to assess Neurologic:no involuntary movements         Lab Data:   Basic Metabolic Panel: Recent Labs  Lab 04/20/22 0716 04/22/22 0229 04/23/22 0222 04/25/22 0502  NA 148* 149* 151* 145  K 4.6 3.7 3.9 4.1  CL 105 106 110 106  CO2 32 34* 35* 33*  GLUCOSE 117* 176* 148* 142*  BUN 37* 43* 36* 36*  CREATININE 0.65 0.82 0.72 0.69  CALCIUM 10.7* 10.5* 10.6* 10.5*  MG  --   --   --  2.3    ABG: Recent Labs  Lab 04/20/22 1015 04/25/22 0720  PHART 7.46* 7.39  PCO2ART 58* 57*  PO2ART 77* 111*  HCO3 41.3* 35.1*  O2SAT 98.5 100    Liver Function Tests: No results for input(s): "AST", "ALT", "ALKPHOS", "BILITOT", "PROT", "ALBUMIN" in the last 168 hours. No results for input(s): "LIPASE", "AMYLASE" in the last 168 hours. No results for input(s): "AMMONIA" in the last 168 hours.  CBC: Recent Labs  Lab 04/25/22 0502  WBC 10.2  HGB 10.9*  HCT 36.2  MCV 89.8  PLT 163    Cardiac  Enzymes: No results for input(s): "CKTOTAL", "CKMB", "CKMBINDEX", "TROPONINI" in the last 168 hours.  BNP (last 3 results) Recent Labs    04/08/22 0336  BNP 51.6    ProBNP (last 3 results) No results for input(s): "PROBNP" in the last 8760 hours.  Radiological Exams: DG Chest Port 1 View  Result Date: 04/25/2022 CLINICAL DATA:  Pleural effusion EXAM: PORTABLE CHEST 1 VIEW COMPARISON:  Portable exam 0909 hours compared to 04/08/2022 FINDINGS: Feeding tube extends into stomach. Tracheostomy tube projects over tracheal air column. Normal heart size, mediastinal contours, and pulmonary vascularity. Bibasilar effusions and atelectasis greater on RIGHT. Improved pulmonary infiltrate. No pneumothorax or acute osseous findings.  Click anguish a IMPRESSION: Bibasilar effusions and atelectasis greater on RIGHT. Electronically Signed   By: Lavonia Dana M.D.   On: 04/25/2022 09:19    Assessment/Plan Active Problems:   Sickle cell trait (HCC)   Acute on chronic respiratory failure with hypoxia (HCC)   Pleural effusion, right   Tracheostomy status ([place])   Healthcare-associated pneumonia   Acute on chronic respiratory failure with hypoxia-patient currently on full support.  We will continue weaning protocol tomorrow.  Continue with supportive care. Sickle cell trait -appears to be under control.  Continue with medical management. Pleural effusion-continue to follow-up with x-rays. Tracheostomy-remains in place and is stable.  Continue with trach care per protocol. Healthcare associated pneumonia-has been treated and resolved.  Continue  with supportive care.   I have personally seen and evaluated the patient, evaluated laboratory and imaging results, formulated the assessment and plan and placed orders. The Patient requires high complexity decision making with multiple systems involvement.  Rounds were done with the Respiratory Therapy Director and Staff therapists and discussed with nursing staff  also.  Allyne Gee, MD [facility] Pulmonary Critical Care Medicine Sleep Medicine

## 2022-04-27 NOTE — Progress Notes (Signed)
Pulmonary [place]   PULMONARY CRITICAL CARE SERVICE  PROGRESS NOTE     WANDY BOSSLER  HCW:[id number]  DOB: September 01, 1951   DOA: 04/08/2022  Referring Physician: Satira Sark, MD  HPI: Hydeia TERITA HEJL is a 71 y.o. female being followed for ventilator/airway/oxygen weaning Acute on Chronic Respiratory Failure. ***  Medications: Reviewed on Rounds  Physical Exam:  Vitals: ***  Ventilator Settings ***  General: Comfortable at this time Neck: supple Cardiovascular: no malignant arrhythmias Respiratory: *** Skin: no rash seen on limited exam Musculoskeletal: No gross abnormality Psychiatric:unable to assess Neurologic:no involuntary movements         Lab Data:   Basic Metabolic Panel: Recent Labs  Lab 04/22/22 0229 04/23/22 0222 04/25/22 0502  NA 149* 151* 145  K 3.7 3.9 4.1  CL 106 110 106  CO2 34* 35* 33*  GLUCOSE 176* 148* 142*  BUN 43* 36* 36*  CREATININE 0.82 0.72 0.69  CALCIUM 10.5* 10.6* 10.5*  MG  --   --  2.3    ABG: Recent Labs  Lab 04/25/22 0720  PHART 7.39  PCO2ART 57*  PO2ART 111*  HCO3 35.1*  O2SAT 100    Liver Function Tests: No results for input(s): "AST", "ALT", "ALKPHOS", "BILITOT", "PROT", "ALBUMIN" in the last 168 hours. No results for input(s): "LIPASE", "AMYLASE" in the last 168 hours. No results for input(s): "AMMONIA" in the last 168 hours.  CBC: Recent Labs  Lab 04/25/22 0502  WBC 10.2  HGB 10.9*  HCT 36.2  MCV 89.8  PLT 163    Cardiac Enzymes: No results for input(s): "CKTOTAL", "CKMB", "CKMBINDEX", "TROPONINI" in the last 168 hours.  BNP (last 3 results) Recent Labs    04/08/22 0336  BNP 51.6    ProBNP (last 3 results) No results for input(s): "PROBNP" in the last 8760 hours.  Radiological Exams: No results found.  Assessment/Plan Active Problems:   Sickle cell trait (HCC)   Acute on chronic respiratory failure with hypoxia (HCC)   Pleural  effusion, right   Tracheostomy status (HCC)   Healthcare-associated pneumonia   *** *** *** *** ***   I have personally seen and evaluated the patient, evaluated laboratory and imaging results, formulated the assessment and plan and placed orders. The Patient requires high complexity decision making with multiple systems involvement.  Rounds were done with the Respiratory Therapy Director and Staff therapists and discussed with nursing staff also.  Allyne Gee, MD [facility] Pulmonary Critical Care Medicine Sleep Medicine

## 2022-04-28 LAB — CBC
HCT: 26.1 % — ABNORMAL LOW (ref 36.0–46.0)
Hemoglobin: 8.2 g/dL — ABNORMAL LOW (ref 12.0–15.0)
MCH: 27.7 pg (ref 26.0–34.0)
MCHC: 31.4 g/dL (ref 30.0–36.0)
MCV: 88.2 fL (ref 80.0–100.0)
Platelets: 136 10*3/uL — ABNORMAL LOW (ref 150–400)
RBC: 2.96 MIL/uL — ABNORMAL LOW (ref 3.87–5.11)
RDW: 15.7 % — ABNORMAL HIGH (ref 11.5–15.5)
WBC: 8 10*3/uL (ref 4.0–10.5)
nRBC: 0 % (ref 0.0–0.2)

## 2022-04-28 LAB — BASIC METABOLIC PANEL
Anion gap: 5 (ref 5–15)
BUN: 59 mg/dL — ABNORMAL HIGH (ref 8–23)
CO2: 33 mmol/L — ABNORMAL HIGH (ref 22–32)
Calcium: 9.8 mg/dL (ref 8.9–10.3)
Chloride: 107 mmol/L (ref 98–111)
Creatinine, Ser: 1.19 mg/dL — ABNORMAL HIGH (ref 0.44–1.00)
GFR, Estimated: 49 mL/min — ABNORMAL LOW (ref >60)
Glucose, Bld: 64 mg/dL — ABNORMAL LOW (ref 70–99)
Potassium: 4.2 mmol/L (ref 3.5–5.1)
Sodium: 145 mmol/L (ref 135–145)

## 2022-04-28 LAB — MAGNESIUM: Magnesium: 2.3 mg/dL (ref 1.7–2.4)

## 2022-04-28 NOTE — Progress Notes (Cosign Needed)
Pulmonary [place]   PULMONARY CRITICAL CARE SERVICE  PROGRESS NOTE     Sheila Fernandez  [id number]:[id number]  DOB: 1950-12-14   DOA: 04/08/2022  Referring Physician: Satira Sark, MD  HPI: Sheila Fernandez is a 71 y.o. female being followed for ventilator/airway/oxygen weaning Acute on Chronic Respiratory Failure.  Patient seen lying in bed, currently on full support.  Unfortunately did fail pressure support trials yesterday and again this morning.  No acute distress, no acute overnight events.  Medications: Reviewed on Rounds  Physical Exam:  Vitals: Temp 97.9, pulse 93, respirations 44, BP 153/99, SPO2 97%  Ventilator Settings AC VC, FiO2 20%, tidal volume 400, rate 12, PEEP 5  General: Comfortable at this time Neck: supple Cardiovascular: no malignant arrhythmias Respiratory: Bilaterally diminished Skin: no rash seen on limited exam Musculoskeletal: No gross abnormality Psychiatric:unable to assess Neurologic:no involuntary movements         Lab Data:   Basic Metabolic Panel: Recent Labs  Lab 04/22/22 0229 04/23/22 0222 04/25/22 0502 04/28/22 0740  NA 149* 151* 145 145  K 3.7 3.9 4.1 4.2  CL 106 110 106 107  CO2 34* 35* 33* 33*  GLUCOSE 176* 148* 142* 64*  BUN 43* 36* 36* 59*  CREATININE 0.82 0.72 0.69 1.19*  CALCIUM 10.5* 10.6* 10.5* 9.8  MG  --   --  2.3 2.3    ABG: Recent Labs  Lab 04/25/22 0720  PHART 7.39  PCO2ART 57*  PO2ART 111*  HCO3 35.1*  O2SAT 100    Liver Function Tests: No results for input(s): "AST", "ALT", "ALKPHOS", "BILITOT", "PROT", "ALBUMIN" in the last 168 hours. No results for input(s): "LIPASE", "AMYLASE" in the last 168 hours. No results for input(s): "AMMONIA" in the last 168 hours.  CBC: Recent Labs  Lab 04/25/22 0502 04/28/22 0740  WBC 10.2 8.0  HGB 10.9* 8.2*  HCT 36.2 26.1*  MCV 89.8 88.2  PLT 163 136*    Cardiac Enzymes: No results for input(s):  "CKTOTAL", "CKMB", "CKMBINDEX", "TROPONINI" in the last 168 hours.  BNP (last 3 results) Recent Labs    04/08/22 0336  BNP 51.6    ProBNP (last 3 results) No results for input(s): "PROBNP" in the last 8760 hours.  Radiological Exams: No results found.  Assessment/Plan Active Problems:   Sickle cell trait (HCC)   Acute on chronic respiratory failure with hypoxia (HCC)   Pleural effusion, right   Tracheostomy status ([place])   Healthcare-associated pneumonia  Acute on chronic respiratory failure with hypoxia-patient remains on full support.  Unfortunately failed pressure support yesterday and again today.  Attempting to continue weaning per protocol.  We will continue with pulmonary toilet. Pleural effusion -.no change we will continue with supportive care Sickle cell trait -at baseline.  Continue with supportive care. Tracheostomy-remains in place and stable.  Continue trach care per protocol. Healthcare associated pneumonia -has been treated with antibiotics.   I have personally seen and evaluated the patient, evaluated laboratory and imaging results, formulated the assessment and plan and placed orders. The Patient requires high complexity decision making with multiple systems involvement.  Rounds were done with the Respiratory Therapy Director and Staff therapists and discussed with nursing staff also.  Allyne Gee, MD [facility] Pulmonary Critical Care Medicine Sleep Medicine

## 2022-04-29 LAB — CULTURE, RESPIRATORY W GRAM STAIN

## 2022-04-29 LAB — CBC
HCT: 26.1 % — ABNORMAL LOW (ref 36.0–46.0)
Hemoglobin: 8.2 g/dL — ABNORMAL LOW (ref 12.0–15.0)
MCH: 27.7 pg (ref 26.0–34.0)
MCHC: 31.4 g/dL (ref 30.0–36.0)
MCV: 88.2 fL (ref 80.0–100.0)
Platelets: 136 10*3/uL — ABNORMAL LOW (ref 150–400)
RBC: 2.96 MIL/uL — ABNORMAL LOW (ref 3.87–5.11)
RDW: 15.9 % — ABNORMAL HIGH (ref 11.5–15.5)
WBC: 7.5 10*3/uL (ref 4.0–10.5)
nRBC: 0 % (ref 0.0–0.2)

## 2022-04-29 NOTE — Progress Notes (Cosign Needed)
Pulmonary [place]   PULMONARY CRITICAL CARE SERVICE  PROGRESS NOTE     Sheila Fernandez  FVC:[id number]  DOB: [birth year]   DOA: 04/08/2022  Referring Physician: Satira Sark, MD  HPI: Sheila Fernandez is a 71 y.o. female being followed for ventilator/airway/oxygen weaning Acute on Chronic Respiratory Failure.  Patient seen lying in bed, currently on pressure support.  Has a pressure support goal of 2 hours today.  Previously has been failing pressure support.  No acute distress, no acute overnight events.  Medications: Reviewed on Rounds  Physical Exam:  Vitals: Temp 97.3, pulse 88, respirations 24, BP 98/60, SPO2 100%  Ventilator Settings pressure support 12/5, FiO2 28%  General: Comfortable at this time Neck: supple Cardiovascular: no malignant arrhythmias Respiratory: Bilaterally diminished Skin: no rash seen on limited exam Musculoskeletal: No gross abnormality Psychiatric:unable to assess Neurologic:no involuntary movements         Lab Data:   Basic Metabolic Panel: Recent Labs  Lab 04/23/22 0222 04/25/22 0502 04/28/22 0740  NA 151* 145 145  K 3.9 4.1 4.2  CL 110 106 107  CO2 35* 33* 33*  GLUCOSE 148* 142* 64*  BUN 36* 36* 59*  CREATININE 0.72 0.69 1.19*  CALCIUM 10.6* 10.5* 9.8  MG  --  2.3 2.3    ABG: Recent Labs  Lab 04/25/22 0720  PHART 7.39  PCO2ART 57*  PO2ART 111*  HCO3 35.1*  O2SAT 100    Liver Function Tests: No results for input(s): "AST", "ALT", "ALKPHOS", "BILITOT", "PROT", "ALBUMIN" in the last 168 hours. No results for input(s): "LIPASE", "AMYLASE" in the last 168 hours. No results for input(s): "AMMONIA" in the last 168 hours.  CBC: Recent Labs  Lab 04/25/22 0502 04/28/22 0740 04/29/22 0355  WBC 10.2 8.0 7.5  HGB 10.9* 8.2* 8.2*  HCT 36.2 26.1* 26.1*  MCV 89.8 88.2 88.2  PLT 163 136* 136*    Cardiac Enzymes: No results for input(s): "CKTOTAL", "CKMB",  "CKMBINDEX", "TROPONINI" in the last 168 hours.  BNP (last 3 results) Recent Labs    04/08/22 0336  BNP 51.6    ProBNP (last 3 results) No results for input(s): "PROBNP" in the last 8760 hours.  Radiological Exams: No results found.  Assessment/Plan Active Problems:   Sickle cell trait (HCC)   Acute on chronic respiratory failure with hypoxia (HCC)   Pleural effusion, right   Tracheostomy status ([place])   Healthcare-associated pneumonia  Acute on chronic respiratory failure with hypoxia-previously has been feeling weaning trials on pressure support.  Currently attempting a 2-hour pressure support goal today.  Attempting to continue weaning per protocol.  We will continue with pulmonary toilet. Pleural effusion -.no change we will continue with supportive care Sickle cell trait -at baseline.  Continue with supportive care. Tracheostomy-remains in place and stable.  Continue trach care per protocol. Healthcare associated pneumonia -has been treated with antibiotics.   I have personally seen and evaluated the patient, evaluated laboratory and imaging results, formulated the assessment and plan and placed orders. The Patient requires high complexity decision making with multiple systems involvement.  Rounds were done with the Respiratory Therapy Director and Staff therapists and discussed with nursing staff also.  Allyne Gee, MD [facility] Pulmonary Critical Care Medicine Sleep Medicine

## 2022-04-30 ENCOUNTER — Institutional Professional Consult (permissible substitution) (HOSPITAL_COMMUNITY): Payer: Medicare Other

## 2022-04-30 NOTE — Progress Notes (Cosign Needed)
Pulmonary [place]   PULMONARY CRITICAL CARE SERVICE  PROGRESS NOTE     Sheila Fernandez  FWY:[id number]  DOB: 07-15-1951   DOA: 04/08/2022  Referring Physician: Satira Sark, MD  HPI: Sheila Fernandez is a 71 y.o. female being followed for ventilator/airway/oxygen weaning Acute on Chronic Respiratory Failure.  Patient seen lying in bed, currently on pressure support with a 12-hour goal.  Originally she had only a 2-hour goal yesterday but was able to do 9 without any issues.  We have placed an order to advance her to 12 hours.  No acute distress, no acute overnight events.  Medications: Reviewed on Rounds  Physical Exam:  Vitals: Temp 97.8, pulse 88, respirations 20, BP 144/82, SPO2 100%  Ventilator Settings pressure support 12/5 FiO2 28%  General: Comfortable at this time Neck: supple Cardiovascular: no malignant arrhythmias Respiratory: Bilaterally diminished Skin: no rash seen on limited exam Musculoskeletal: No gross abnormality Psychiatric:unable to assess Neurologic:no involuntary movements         Lab Data:   Basic Metabolic Panel: Recent Labs  Lab 04/25/22 0502 04/28/22 0740  NA 145 145  K 4.1 4.2  CL 106 107  CO2 33* 33*  GLUCOSE 142* 64*  BUN 36* 59*  CREATININE 0.69 1.19*  CALCIUM 10.5* 9.8  MG 2.3 2.3    ABG: Recent Labs  Lab 04/25/22 0720  PHART 7.39  PCO2ART 57*  PO2ART 111*  HCO3 35.1*  O2SAT 100    Liver Function Tests: No results for input(s): "AST", "ALT", "ALKPHOS", "BILITOT", "PROT", "ALBUMIN" in the last 168 hours. No results for input(s): "LIPASE", "AMYLASE" in the last 168 hours. No results for input(s): "AMMONIA" in the last 168 hours.  CBC: Recent Labs  Lab 04/25/22 0502 04/28/22 0740 04/29/22 0355  WBC 10.2 8.0 7.5  HGB 10.9* 8.2* 8.2*  HCT 36.2 26.1* 26.1*  MCV 89.8 88.2 88.2  PLT 163 136* 136*    Cardiac Enzymes: No results for input(s): "CKTOTAL",  "CKMB", "CKMBINDEX", "TROPONINI" in the last 168 hours.  BNP (last 3 results) Recent Labs    04/08/22 0336  BNP 51.6    ProBNP (last 3 results) No results for input(s): "PROBNP" in the last 8760 hours.  Radiological Exams: No results found.  Assessment/Plan Active Problems:   Sickle cell trait (HCC)   Acute on chronic respiratory failure with hypoxia (HCC)   Pleural effusion, right   Tracheostomy status (HCC)   Healthcare-associated pneumonia   Acute on chronic respiratory failure with hypoxia-had a 2-hour pressure support goal yesterday but was able to go 9 without issue so we have advanced her to 12 hours today.  Attempting to continue weaning per protocol.  We will continue with pulmonary toilet. Pleural effusion -.no change we will continue with supportive care Sickle cell trait -at baseline.  Continue with supportive care. Tracheostomy-remains in place and stable.  Continue trach care per protocol. Healthcare associated pneumonia -has been treated with antibiotics.   I have personally seen and evaluated the patient, evaluated laboratory and imaging results, formulated the assessment and plan and placed orders. The Patient requires high complexity decision making with multiple systems involvement.  Rounds were done with the Respiratory Therapy Director and Staff therapists and discussed with nursing staff also.  Allyne Gee, MD [facility] Pulmonary Critical Care Medicine Sleep Medicine

## 2022-05-01 ENCOUNTER — Institutional Professional Consult (permissible substitution) (HOSPITAL_COMMUNITY): Payer: Medicare Other

## 2022-05-01 LAB — CBC
HCT: 26.2 % — ABNORMAL LOW (ref 36.0–46.0)
Hemoglobin: 8.2 g/dL — ABNORMAL LOW (ref 12.0–15.0)
MCH: 27.4 pg (ref 26.0–34.0)
MCHC: 31.3 g/dL (ref 30.0–36.0)
MCV: 87.6 fL (ref 80.0–100.0)
Platelets: 155 10*3/uL (ref 150–400)
RBC: 2.99 MIL/uL — ABNORMAL LOW (ref 3.87–5.11)
RDW: 15.7 % — ABNORMAL HIGH (ref 11.5–15.5)
WBC: 5.5 10*3/uL (ref 4.0–10.5)
nRBC: 0 % (ref 0.0–0.2)

## 2022-05-01 LAB — BASIC METABOLIC PANEL
Anion gap: 6 (ref 5–15)
BUN: 36 mg/dL — ABNORMAL HIGH (ref 8–23)
CO2: 28 mmol/L (ref 22–32)
Calcium: 9.5 mg/dL (ref 8.9–10.3)
Chloride: 108 mmol/L (ref 98–111)
Creatinine, Ser: 0.85 mg/dL (ref 0.44–1.00)
GFR, Estimated: >60 mL/min (ref >60)
Glucose, Bld: 127 mg/dL — ABNORMAL HIGH (ref 70–99)
Potassium: 4.2 mmol/L (ref 3.5–5.1)
Sodium: 142 mmol/L (ref 135–145)

## 2022-05-01 LAB — MAGNESIUM: Magnesium: 2 mg/dL (ref 1.7–2.4)

## 2022-05-01 MED ORDER — GADOBUTROL 1 MMOL/ML IV SOLN
5.0000 mL | Freq: Once | INTRAVENOUS | Status: AC | PRN
  Administered 2022-05-01: 5 mL via INTRAVENOUS

## 2022-05-01 NOTE — Progress Notes (Signed)
Attempted to get pt at '@1'$ :00pm , RN stated that respiratory wasn't able to come down due to machine after we planned time '@10'$ :30am

## 2022-05-01 NOTE — Progress Notes (Signed)
Rec'd pt from [facility] RT down in MRI.  Pt placed on [facility] MRI vent for procedure w/ 100% fio2.  No apparent RT complications noted while on [facility] MRI vent.  [facility] RT remained w/ pt t/o procedure, this RT was at procedure for [facility] MRI vent.  Upon completion of procedure, [facility] RT placed back on their vent.

## 2022-05-01 NOTE — Progress Notes (Signed)
Pulmonary [place]   PULMONARY CRITICAL CARE SERVICE  PROGRESS NOTE     Sheila Fernandez  CLE:[id number]  DOB: 1951/09/22   DOA: 04/08/2022  Referring Physician: Satira Sark, MD  HPI: Sheila Fernandez is a 71 y.o. female being followed for ventilator/airway/oxygen weaning Acute on Chronic Respiratory Failure.  Patient resting comfortably at this time without distress patient has been on the ventilator and full support supposed be doing 16 hours on pressure support today yesterday was able to complete 12 hours  Medications: Reviewed on Rounds  Physical Exam:  Vitals: Temperature is 97.3 pulse of 86 respiratory 18 blood pressure is 98/62 saturations 100%  Ventilator Settings assist-control FiO2 28% tidal volume 373 PEEP of 5  General: Comfortable at this time Neck: supple Cardiovascular: no malignant arrhythmias Respiratory: No rhonchi very coarse breath sounds Skin: no rash seen on limited exam Musculoskeletal: No gross abnormality Psychiatric:unable to assess Neurologic:no involuntary movements         Lab Data:   Basic Metabolic Panel: Recent Labs  Lab 04/25/22 0502 04/28/22 0740 05/01/22 0411  NA 145 145 142  K 4.1 4.2 4.2  CL 106 107 108  CO2 33* 33* 28  GLUCOSE 142* 64* 127*  BUN 36* 59* 36*  CREATININE 0.69 1.19* 0.85  CALCIUM 10.5* 9.8 9.5  MG 2.3 2.3 2.0    ABG: Recent Labs  Lab 04/25/22 0720  PHART 7.39  PCO2ART 57*  PO2ART 111*  HCO3 35.1*  O2SAT 100    Liver Function Tests: No results for input(s): "AST", "ALT", "ALKPHOS", "BILITOT", "PROT", "ALBUMIN" in the last 168 hours. No results for input(s): "LIPASE", "AMYLASE" in the last 168 hours. No results for input(s): "AMMONIA" in the last 168 hours.  CBC: Recent Labs  Lab 04/25/22 0502 04/28/22 0740 04/29/22 0355 05/01/22 0411  WBC 10.2 8.0 7.5 5.5  HGB 10.9* 8.2* 8.2* 8.2*  HCT 36.2 26.1* 26.1* 26.2*  MCV 89.8 88.2 88.2 87.6   PLT 163 136* 136* 155    Cardiac Enzymes: No results for input(s): "CKTOTAL", "CKMB", "CKMBINDEX", "TROPONINI" in the last 168 hours.  BNP (last 3 results) Recent Labs    04/08/22 0336  BNP 51.6    ProBNP (last 3 results) No results for input(s): "PROBNP" in the last 8760 hours.  Radiological Exams: CT ABDOMEN WO CONTRAST  Result Date: 05/01/2022 CLINICAL DATA:  Evaluate anatomy for possible percutaneous gastrostomy tube placement. EXAM: CT ABDOMEN WITHOUT CONTRAST TECHNIQUE: Multidetector CT imaging of the abdomen was performed following the standard protocol without IV contrast. RADIATION DOSE REDUCTION: This exam was performed according to the departmental dose-optimization program which includes automated exposure control, adjustment of the mA and/or kV according to patient size and/or use of iterative reconstruction technique. COMPARISON:  CTA chest, abdomen and pelvis 03/11/2012 FINDINGS: Lower chest: Volume loss with air bronchograms in both lower lobes, right side greater than left. Pneumonia cannot be cannot be excluded. No pleural effusions. Hepatobiliary: Cholecystectomy.  Normal appearance of the liver. Pancreas: Unremarkable. No pancreatic ductal dilatation or surrounding inflammatory changes. Spleen: Normal in size without focal abnormality. Adrenals/Urinary Tract: Adrenal glands are within normal limits and stable. Normal appearance of both kidneys without stones or hydronephrosis. Stomach/Bowel: Feeding tube extends into the stomach and terminates near the duodenal bulb. The stomach is situated along the anterior abdominal wall. The colon is caudal to the stomach. There is oral contrast within colon. No significant bowel distension and no evidence for bowel obstruction. Vascular/Lymphatic: Abdominal aorta  is tortuous without aneurysm. Few atherosclerotic calcifications in the common iliac arteries. No significant lymph node enlargement in the abdomen. Other: Negative for ascites.   No evidence for a ventral hernia. Musculoskeletal: No acute bone abnormality. IMPRESSION: 1. Anatomy is amendable for percutaneous gastrostomy tube placement. 2. Volume loss and air bronchograms in bilateral lower lobes, right side greater than left. Cannot exclude underlying pneumonia. No pleural effusions. Electronically Signed   By: Markus Daft M.D.   On: 05/01/2022 08:24    Assessment/Plan Active Problems:   Sickle cell trait (HCC)   Acute on chronic respiratory failure with hypoxia (HCC)   Pleural effusion, right   Tracheostomy status (HCC)   Healthcare-associated pneumonia   Acute on chronic respiratory failure with hypoxia plan is going to be to continue with the weaning protocol with a 16-hour goal on pressure support Sickle cell trait no change we will continue to monitor and follow along Tracheostomy remains in place we will continue with supportive care Healthcare associated pneumonia has been treated with antibiotics Pleural effusion continue to monitor follow-up on chest film   I have personally seen and evaluated the patient, evaluated laboratory and imaging results, formulated the assessment and plan and placed orders. The Patient requires high complexity decision making with multiple systems involvement.  Rounds were done with the Respiratory Therapy Director and Staff therapists and discussed with nursing staff also.  Allyne Gee, MD [facility] Pulmonary Critical Care Medicine Sleep Medicine

## 2022-05-02 MED ORDER — CEFAZOLIN SODIUM-DEXTROSE 2-4 GM/100ML-% IV SOLN: 2.0000 g | Freq: Three times a day (TID) | INTRAVENOUS | Status: DC

## 2022-05-02 NOTE — Progress Notes (Signed)
Pulmonary [place]   PULMONARY CRITICAL CARE SERVICE  PROGRESS NOTE     Sheila Fernandez  EUM:[id number]  DOB: 1951-08-08   DOA: 04/08/2022  Referring Physician: Satira Sark, MD  HPI: Sheila Fernandez is a 71 y.o. female being followed for ventilator/airway/oxygen weaning Acute on Chronic Respiratory Failure.  Patient is on pressure support mode currently has been on 28% FiO2  Medications: Reviewed on Rounds  Physical Exam:  Vitals: Temperature is 97.1 pulse of 78 respiratory is 18 blood pressure 96/65 saturations 100%  Ventilator Settings pressure support FiO2 is 28% pressure of 10/5 tidal volume 321  General: Comfortable at this time Neck: supple Cardiovascular: no malignant arrhythmias Respiratory: Scattered rhonchi expansion is equal Skin: no rash seen on limited exam Musculoskeletal: No gross abnormality Psychiatric:unable to assess Neurologic:no involuntary movements         Lab Data:   Basic Metabolic Panel: Recent Labs  Lab 04/28/22 0740 05/01/22 0411  NA 145 142  K 4.2 4.2  CL 107 108  CO2 33* 28  GLUCOSE 64* 127*  BUN 59* 36*  CREATININE 1.19* 0.85  CALCIUM 9.8 9.5  MG 2.3 2.0    ABG: No results for input(s): "PHART", "PCO2ART", "PO2ART", "HCO3", "O2SAT" in the last 168 hours.  Liver Function Tests: No results for input(s): "AST", "ALT", "ALKPHOS", "BILITOT", "PROT", "ALBUMIN" in the last 168 hours. No results for input(s): "LIPASE", "AMYLASE" in the last 168 hours. No results for input(s): "AMMONIA" in the last 168 hours.  CBC: Recent Labs  Lab 04/28/22 0740 04/29/22 0355 05/01/22 0411  WBC 8.0 7.5 5.5  HGB 8.2* 8.2* 8.2*  HCT 26.1* 26.1* 26.2*  MCV 88.2 88.2 87.6  PLT 136* 136* 155    Cardiac Enzymes: No results for input(s): "CKTOTAL", "CKMB", "CKMBINDEX", "TROPONINI" in the last 168 hours.  BNP (last 3 results) Recent Labs    04/08/22 0336  BNP 51.6    ProBNP (last  3 results) No results for input(s): "PROBNP" in the last 8760 hours.  Radiological Exams: MR CERVICAL SPINE W WO CONTRAST  Result Date: 05/01/2022 CLINICAL DATA:  Left arm weakness, concern for spinal canal stenosis EXAM: MRI CERVICAL SPINE WITHOUT AND WITH CONTRAST TECHNIQUE: Multiplanar and multiecho pulse sequences of the cervical spine, to include the craniocervical junction and cervicothoracic junction, were obtained without and with intravenous contrast. CONTRAST:  41m GADAVIST GADOBUTROL 1 MMOL/ML IV SOLN COMPARISON:  03/14/2022 FINDINGS: Evaluation is somewhat limited by motion artifact. Alignment: Unchanged trace retrolisthesis of C3 on C4 and C5 on C6. No new listhesis. Vertebrae: No acute fracture or suspicious osseous lesion. No abnormal enhancement. Cord: Normal signal and morphology.  No abnormal enhancement. Posterior Fossa, vertebral arteries, paraspinal tissues: Negative. Disc levels: C2-C3: No significant disc bulge. Uncovertebral and facet arthropathy. No spinal canal stenosis. Mild bilateral neural foraminal narrowing, unchanged. C3-C4: No significant disc bulge. Left-greater-than-right facet and uncovertebral hypertrophy. Ligamentum flavum hypertrophy. Mild spinal canal stenosis, unchanged. Severe left and moderate right neural foraminal narrowing, unchanged. C4-C5: Minimal disc bulge. Facet and uncovertebral hypertrophy. Mild spinal canal stenosis, unchanged. Moderate bilateral neural foraminal narrowing, unchanged. C5-C6: Mild disc bulge with disc osteophyte complex. Uncovertebral and facet arthropathy. Mild spinal canal stenosis, unchanged. Evaluation of the neural foramina is limited by motion artifact, but there appears to the moderate bilateral neural foraminal narrowing. C6-C7: Minimal disc bulge. No spinal canal stenosis. Mild left neural foraminal narrowing, unchanged. C7-T1: No significant disc bulge. Facet arthropathy. No spinal canal stenosis or neuroforaminal  narrowing.  IMPRESSION: 1. Unchanged appearance of the cervical spine. No new abnormal enhancement. 2. Evaluation for degenerative changes is occasionally limited by motion artifact. Within this limitation, unchanged degenerative findings compared to 03/14/2022. Electronically Signed   By: Merilyn Baba M.D.   On: 05/01/2022 17:45   CT ABDOMEN WO CONTRAST  Result Date: 05/01/2022 CLINICAL DATA:  Evaluate anatomy for possible percutaneous gastrostomy tube placement. EXAM: CT ABDOMEN WITHOUT CONTRAST TECHNIQUE: Multidetector CT imaging of the abdomen was performed following the standard protocol without IV contrast. RADIATION DOSE REDUCTION: This exam was performed according to the departmental dose-optimization program which includes automated exposure control, adjustment of the mA and/or kV according to patient size and/or use of iterative reconstruction technique. COMPARISON:  CTA chest, abdomen and pelvis 03/11/2012 FINDINGS: Lower chest: Volume loss with air bronchograms in both lower lobes, right side greater than left. Pneumonia cannot be cannot be excluded. No pleural effusions. Hepatobiliary: Cholecystectomy.  Normal appearance of the liver. Pancreas: Unremarkable. No pancreatic ductal dilatation or surrounding inflammatory changes. Spleen: Normal in size without focal abnormality. Adrenals/Urinary Tract: Adrenal glands are within normal limits and stable. Normal appearance of both kidneys without stones or hydronephrosis. Stomach/Bowel: Feeding tube extends into the stomach and terminates near the duodenal bulb. The stomach is situated along the anterior abdominal wall. The colon is caudal to the stomach. There is oral contrast within colon. No significant bowel distension and no evidence for bowel obstruction. Vascular/Lymphatic: Abdominal aorta is tortuous without aneurysm. Few atherosclerotic calcifications in the common iliac arteries. No significant lymph node enlargement in the abdomen. Other: Negative for  ascites.  No evidence for a ventral hernia. Musculoskeletal: No acute bone abnormality. IMPRESSION: 1. Anatomy is amendable for percutaneous gastrostomy tube placement. 2. Volume loss and air bronchograms in bilateral lower lobes, right side greater than left. Cannot exclude underlying pneumonia. No pleural effusions. Electronically Signed   By: Markus Daft M.D.   On: 05/01/2022 08:24    Assessment/Plan Active Problems:   Sickle cell trait (HCC)   Acute on chronic respiratory failure with hypoxia (HCC)   Pleural effusion, right   Tracheostomy status (HCC)   Healthcare-associated pneumonia   Acute on chronic respiratory failure with hypoxia at this time patient is on pressure support and the plan is going to be to continue to advance the weaning Sickle cell trait no change continue with supportive care Pleural effusion status post drainage Tracheostomy remains in place Healthcare associated pneumonia has been treated slowly improving   I have personally seen and evaluated the patient, evaluated laboratory and imaging results, formulated the assessment and plan and placed orders. The Patient requires high complexity decision making with multiple systems involvement.  Rounds were done with the Respiratory Therapy Director and Staff therapists and discussed with nursing staff also.  Allyne Gee, MD [facility] Pulmonary Critical Care Medicine Sleep Medicine

## 2022-05-02 NOTE — Consult Note (Signed)
Chief Complaint: Patient was seen in consultation today for No chief complaint on file.  at the request of Dr. Rollene Rotunda  Referring Physician(s): Dr. Rollene Rotunda  Supervising Physician: Juliet Rude  Patient Status: [facility]  History of Present Illness: Sheila Fernandez is a 71 y.o. female who was admitted to the hospital initially with pneumonia and experienced respiratory failure requiring vent and trach.  She was not tolerating weaning and was transferred to [facility].  She has been dependent on nutrition via NG tube during much of this time.  As she continues to be unable to meet nutritional needs, IR has been consulted for placement of percutaneous gastrostomy tube.  Patient expresses eagerness to be rid of the NG tube and looks forward to building her swallow back.   Past Medical History:  Diagnosis Date   Adenomyosis    Anemia    Arthritis    Hypertension    Iritis, recurrent    Orthostatic hypotension 01/21/2019   Sickle cell trait ([place])    Uterine fibroid     Past Surgical History:  Procedure Laterality Date   BREAST BIOPSY Left 1986   benign excisional no scar seen    BREAST LUMPECTOMY Left    Pt does not recall having lumpectomy   CESAREAN SECTION     CHOLECYSTECTOMY     FOOT SURGERY     cyst removed    Allergies: Ativan [lorazepam] and Codeine  Medications: Prior to Admission medications   Medication Sig Start Date End Date Taking? Authorizing Provider  albuterol (PROVENTIL) (2.5 MG/3ML) 0.083% nebulizer solution Take 3 mLs (2.5 mg total) by nebulization every 4 (four) hours as needed for wheezing or shortness of breath. 04/08/22   Mick Sell, PA-C  docusate (COLACE) 50 MG/5ML liquid Place 10 mLs (100 mg total) into feeding tube 2 (two) times daily as needed for mild constipation. 04/08/22   Mick Sell, PA-C  guaiFENesin (ROBITUSSIN) 100 MG/5ML liquid Place 15 mLs into feeding tube every 6 (six) hours. 04/08/22   Mick Sell, PA-C  lip balm  (CARMEX) ointment Apply topically as needed for lip care. 04/08/22   Mick Sell, PA-C  Multiple Vitamin (MULTIVITAMIN WITH MINERALS) TABS tablet Place 1 tablet into feeding tube daily. 04/09/22   Mick Sell, PA-C  ondansetron (ZOFRAN) 4 MG/2ML SOLN injection Inject 2 mLs (4 mg total) into the vein every 6 (six) hours as needed for nausea. 04/08/22   Mick Sell, PA-C  pantoprazole (PROTONIX) 40 MG tablet Take 40 mg by mouth daily. 02/20/22   [provider]  polyethylene glycol (MIRALAX / GLYCOLAX) 17 g packet Place 17 g into feeding tube daily as needed for moderate constipation. 04/08/22   Mick Sell, PA-C  QUEtiapine (SEROQUEL) 50 MG tablet Place 1 tablet (50 mg total) into feeding tube 2 (two) times daily. 04/08/22   Mick Sell, PA-C  thiamine 100 MG tablet Place 1 tablet (100 mg total) into feeding tube daily. 04/09/22   Mick Sell, PA-C  tobramycin, PF, (TOBI) 300 MG/5ML nebulizer solution Take 5 mLs (300 mg total) by nebulization 2 (two) times daily. 04/08/22   Mick Sell, PA-C     Family History  Problem Relation Age of Onset   Cancer Mother 18       Pancreatic   Hypertension Mother    Stroke Father    Cancer Father 49       Prostate   Hypertension Father  Breast cancer Sister    Diabetes Sister    HIV Sister    Cancer Sister        Cancer r/t HIV   Lupus Daughter    Diabetes Maternal Grandmother    Diabetes Brother    Hypertension Brother    Kidney disease Brother    Cancer Brother        Prostate   Colon cancer Neg Hx    Esophageal cancer Neg Hx    Stomach cancer Neg Hx    Colon polyps Neg Hx     Social History   Socioeconomic History   Marital status: Divorced    Spouse name: Not on file   Number of children: 7   Years of education: Not on file   Highest education level: Not on file  Occupational History   Not on file  Tobacco Use   Smoking status: Former    Packs/day: 1.00    Years: 15.00    Total pack years: 15.00    Types:  Cigarettes    Quit date: 11/28/2003    Years since quitting: 18.4   Smokeless tobacco: Never  Vaping Use   Vaping Use: Never used  Substance and Sexual Activity   Alcohol use: No   Drug use: No   Sexual activity: Not Currently    Birth control/protection: None  Other Topics Concern   Not on file  Social History Narrative   Right handed   Social Determinants of Health   Financial Resource Strain: Not on file  Food Insecurity: Not on file  Transportation Needs: Not on file  Physical Activity: Not on file  Stress: Not on file  Social Connections: Not on file    Review of Systems: A 12 point ROS discussed and pertinent positives are indicated in the HPI above.  All other systems are negative.  Vital Signs: HR 106  Physical Exam  Imaging: MR CERVICAL SPINE W WO CONTRAST  Result Date: 05/01/2022 CLINICAL DATA:  Left arm weakness, concern for spinal canal stenosis EXAM: MRI CERVICAL SPINE WITHOUT AND WITH CONTRAST TECHNIQUE: Multiplanar and multiecho pulse sequences of the cervical spine, to include the craniocervical junction and cervicothoracic junction, were obtained without and with intravenous contrast. CONTRAST:  40m GADAVIST GADOBUTROL 1 MMOL/ML IV SOLN COMPARISON:  03/14/2022 FINDINGS: Evaluation is somewhat limited by motion artifact. Alignment: Unchanged trace retrolisthesis of C3 on C4 and C5 on C6. No new listhesis. Vertebrae: No acute fracture or suspicious osseous lesion. No abnormal enhancement. Cord: Normal signal and morphology.  No abnormal enhancement. Posterior Fossa, vertebral arteries, paraspinal tissues: Negative. Disc levels: C2-C3: No significant disc bulge. Uncovertebral and facet arthropathy. No spinal canal stenosis. Mild bilateral neural foraminal narrowing, unchanged. C3-C4: No significant disc bulge. Left-greater-than-right facet and uncovertebral hypertrophy. Ligamentum flavum hypertrophy. Mild spinal canal stenosis, unchanged. Severe left and moderate right  neural foraminal narrowing, unchanged. C4-C5: Minimal disc bulge. Facet and uncovertebral hypertrophy. Mild spinal canal stenosis, unchanged. Moderate bilateral neural foraminal narrowing, unchanged. C5-C6: Mild disc bulge with disc osteophyte complex. Uncovertebral and facet arthropathy. Mild spinal canal stenosis, unchanged. Evaluation of the neural foramina is limited by motion artifact, but there appears to the moderate bilateral neural foraminal narrowing. C6-C7: Minimal disc bulge. No spinal canal stenosis. Mild left neural foraminal narrowing, unchanged. C7-T1: No significant disc bulge. Facet arthropathy. No spinal canal stenosis or neuroforaminal narrowing. IMPRESSION: 1. Unchanged appearance of the cervical spine. No new abnormal enhancement. 2. Evaluation for degenerative changes is occasionally limited by motion artifact.  Within this limitation, unchanged degenerative findings compared to 03/14/2022. Electronically Signed   By: Merilyn Baba M.D.   On: 05/01/2022 17:45   CT ABDOMEN WO CONTRAST  Result Date: 05/01/2022 CLINICAL DATA:  Evaluate anatomy for possible percutaneous gastrostomy tube placement. EXAM: CT ABDOMEN WITHOUT CONTRAST TECHNIQUE: Multidetector CT imaging of the abdomen was performed following the standard protocol without IV contrast. RADIATION DOSE REDUCTION: This exam was performed according to the departmental dose-optimization program which includes automated exposure control, adjustment of the mA and/or kV according to patient size and/or use of iterative reconstruction technique. COMPARISON:  CTA chest, abdomen and pelvis 03/11/2012 FINDINGS: Lower chest: Volume loss with air bronchograms in both lower lobes, right side greater than left. Pneumonia cannot be cannot be excluded. No pleural effusions. Hepatobiliary: Cholecystectomy.  Normal appearance of the liver. Pancreas: Unremarkable. No pancreatic ductal dilatation or surrounding inflammatory changes. Spleen: Normal in size  without focal abnormality. Adrenals/Urinary Tract: Adrenal glands are within normal limits and stable. Normal appearance of both kidneys without stones or hydronephrosis. Stomach/Bowel: Feeding tube extends into the stomach and terminates near the duodenal bulb. The stomach is situated along the anterior abdominal wall. The colon is caudal to the stomach. There is oral contrast within colon. No significant bowel distension and no evidence for bowel obstruction. Vascular/Lymphatic: Abdominal aorta is tortuous without aneurysm. Few atherosclerotic calcifications in the common iliac arteries. No significant lymph node enlargement in the abdomen. Other: Negative for ascites.  No evidence for a ventral hernia. Musculoskeletal: No acute bone abnormality. IMPRESSION: 1. Anatomy is amendable for percutaneous gastrostomy tube placement. 2. Volume loss and air bronchograms in bilateral lower lobes, right side greater than left. Cannot exclude underlying pneumonia. No pleural effusions. Electronically Signed   By: Markus Daft M.D.   On: 05/01/2022 08:24   DG Chest Port 1 View  Result Date: 04/25/2022 CLINICAL DATA:  Pleural effusion EXAM: PORTABLE CHEST 1 VIEW COMPARISON:  Portable exam 0909 hours compared to 04/08/2022 FINDINGS: Feeding tube extends into stomach. Tracheostomy tube projects over tracheal air column. Normal heart size, mediastinal contours, and pulmonary vascularity. Bibasilar effusions and atelectasis greater on RIGHT. Improved pulmonary infiltrate. No pneumothorax or acute osseous findings.  Click anguish a IMPRESSION: Bibasilar effusions and atelectasis greater on RIGHT. Electronically Signed   By: Lavonia Dana M.D.   On: 04/25/2022 09:19   DG Abd Portable 1V  Result Date: 04/08/2022 CLINICAL DATA:  NG tube EXAM: PORTABLE ABDOMEN - 1 VIEW COMPARISON:  03/27/2022 FINDINGS: Esophageal tube tip overlies the duodenal bulb region. Nonobstructed gas pattern with moderate stool. IMPRESSION: Esophageal tube  tip overlies the expected location of duodenal bulb. Electronically Signed   By: Donavan Foil M.D.   On: 04/08/2022 20:41   DG CHEST PORT 1 VIEW  Result Date: 04/08/2022 CLINICAL DATA:  Tracheostomy EXAM: PORTABLE CHEST 1 VIEW COMPARISON:  04/08/2022, 04/07/2022, 04/05/2022 FINDINGS: Right upper extremity central venous catheter tip over cavoatrial region. Tracheostomy tube tip about 4.8 cm superior to the carina. Esophageal tube tip below the diaphragm. Cardiomegaly with elevated right diaphragm. Airspace disease at the right base. Vascular congestion. No pneumothorax. IMPRESSION: 1. Tracheostomy tube with tip about 4.8 cm superior to carina. 2. Cardiomegaly with vascular congestion. Similar airspace disease at the right greater than left lung base. Electronically Signed   By: Donavan Foil M.D.   On: 04/08/2022 20:41   DG CHEST PORT 1 VIEW  Result Date: 04/08/2022 CLINICAL DATA:  On ventilator. Respiratory failure. Pleural effusion. EXAM: PORTABLE CHEST 1 VIEW  COMPARISON:  04/07/2022 FINDINGS: Tracheostomy tube remains in appropriate position. Feeding tube tip is again seen overlying the pylorus or duodenal bulb. Right arm PICC line remains in appropriate position. Mild cardiomegaly remains stable. Elevation of right hemidiaphragm is again seen. Subpulmonic right pleural effusion cannot be excluded. There is persistent opacity in the right lower lung, without significant change. IMPRESSION: Feeding tube tip overlies the pylorus or duodenal bulb. Stable right lower lung opacity, which may be due to infiltrate or atelectasis. Stable elevation of right hemidiaphragm; subpulmonic right pleural effusion cannot be excluded. Electronically Signed   By: Marlaine Hind M.D.   On: 04/08/2022 08:13   DG CHEST PORT 1 VIEW  Result Date: 04/07/2022 CLINICAL DATA:  71 year old female with pleural effusion. EXAM: PORTABLE CHEST 1 VIEW COMPARISON:  Portable chest 04/05/2022 and earlier. FINDINGS: Portable AP upright  view at 1113 hours. Stable tracheostomy. Enteric feeding tube tip is in the right upper quadrant likely at the distal stomach. Stable right PICC line. Improved right lung volume and ventilation from yesterday, with substantially less pleural fluid visible on that side. Patchy and confluent residual right lung base opacity. No left pleural effusion is evident. Stable left lung ventilation. No overt edema. No pneumothorax. Normal cardiac size and mediastinal contours. Right upper quadrant surgical clips. Negative visible bowel gas. No acute osseous abnormality identified. IMPRESSION: 1. Improved right lung volume and ventilation from yesterday with substantially less pleural fluid evident. Ongoing Patchy and confluent right lung base opacity is nonspecific. 2. Left lung is stable, negative. 3. Enteric feeding tube tip at the distal stomach level. Stable tracheostomy and right PICC line. Electronically Signed   By: Genevie Ann M.D.   On: 04/07/2022 11:37   DG CHEST PORT 1 VIEW  Result Date: 04/05/2022 CLINICAL DATA:  Shortness of breath and fatigue. EXAM: PORTABLE CHEST 1 VIEW COMPARISON:  03/27/2022 FINDINGS: Tracheostomy tube remains in place. A feeding tube passes into the stomach although the distal tip position is not included on the film. Right PICC line tip overlies the expected location of the upper right atrium. Increased volume loss right hemithorax with progressive basilar collapse/consolidation and increasing right pleural effusion. Left lung remains relatively clear. Telemetry leads overlie the chest. IMPRESSION: Increased volume loss right hemithorax with progressive right basilar collapse/consolidation and increasing right pleural effusion. Electronically Signed   By: Misty Stanley M.D.   On: 04/05/2022 11:58    Labs:  CBC: Recent Labs    04/25/22 0502 04/28/22 0740 04/29/22 0355 05/01/22 0411  WBC 10.2 8.0 7.5 5.5  HGB 10.9* 8.2* 8.2* 8.2*  HCT 36.2 26.1* 26.1* 26.2*  PLT 163 136* 136* 155     COAGS: No results for input(s): "INR", "APTT" in the last 8760 hours.  BMP: Recent Labs    04/23/22 0222 04/25/22 0502 04/28/22 0740 05/01/22 0411  NA 151* 145 145 142  K 3.9 4.1 4.2 4.2  CL 110 106 107 108  CO2 35* 33* 33* 28  GLUCOSE 148* 142* 64* 127*  BUN 36* 36* 59* 36*  CALCIUM 10.6* 10.5* 9.8 9.5  CREATININE 0.72 0.69 1.19* 0.85  GFRNONAA >60 >60 49* >60    LIVER FUNCTION TESTS: Recent Labs    03/24/22 0419 03/29/22 0638 04/09/22 0555 04/17/22 0514  BILITOT 0.4 0.8 0.4 0.9  AST 93* '16 20 16  '$ ALT 169* 67* 42 18  ALKPHOS 54 64 96 71  PROT 4.8* 6.0* 6.5 6.9  ALBUMIN 2.0* 2.3* 2.6* 3.0*    Assessment and Plan:  71 year old female with respiratory failure who is trach and vent dependent in LTAC.  Additionally, unable to meet nutritional needs via PO route, requiring feeds through NG tube.  Now G-tube requested.  Lovenox to be held Sunday in preparation of placement.   NPO including tube feeds at MN.  Labs to be drawn Monday morning.  Orders communicated verbally with read back to nurse, Mardene Celeste, and placed into Epic.  Allergy to codeine confirmed.  Thorough discussion held with daughter, Tonette Bihari, by phone and separately with the patient.  Will tentatively plan for placement Monday 8/7, schedule permitting.   Risks and benefits image guided gastrostomy tube placement was discussed with the patient and daughter including, but not limited to the need for a barium enema during the procedure, bleeding, infection, peritonitis and/or damage to adjacent structures.  All of the patient's questions were answered, patient is agreeable to proceed.  Consents signed and in IR.    Thank you for this interesting consult.  I greatly enjoyed meeting [place] and look forward to participating in their care.  A copy of this report was sent to the requesting provider on this date.  Electronically Signed: Pasty Spillers, PA 05/02/2022, 2:25 PM   I spent a total  of 40 Minutes in face to face in clinical consultation, greater than 50% of which was counseling/coordinating care for g-tube placement.

## 2022-05-03 ENCOUNTER — Institutional Professional Consult (permissible substitution) (HOSPITAL_COMMUNITY): Payer: Medicare Other

## 2022-05-03 LAB — BLOOD GAS, ARTERIAL
Acid-Base Excess: 9 mmol/L — ABNORMAL HIGH (ref 0.0–2.0)
Bicarbonate: 37.1 mmol/L — ABNORMAL HIGH (ref 20.0–28.0)
O2 Saturation: 99.6 %
Patient temperature: 37
pCO2 arterial: 72 mmHg (ref 32–48)
pH, Arterial: 7.32 — ABNORMAL LOW (ref 7.35–7.45)
pO2, Arterial: 127 mmHg — ABNORMAL HIGH (ref 83–108)

## 2022-05-03 LAB — COMPREHENSIVE METABOLIC PANEL
ALT: 179 U/L — ABNORMAL HIGH (ref 0–44)
AST: 158 U/L — ABNORMAL HIGH (ref 15–41)
Albumin: 3.4 g/dL — ABNORMAL LOW (ref 3.5–5.0)
Alkaline Phosphatase: 81 U/L (ref 38–126)
Anion gap: 7 (ref 5–15)
BUN: 30 mg/dL — ABNORMAL HIGH (ref 8–23)
CO2: 31 mmol/L (ref 22–32)
Calcium: 9.8 mg/dL (ref 8.9–10.3)
Chloride: 109 mmol/L (ref 98–111)
Creatinine, Ser: 0.56 mg/dL (ref 0.44–1.00)
GFR, Estimated: >60 mL/min (ref >60)
Glucose, Bld: 153 mg/dL — ABNORMAL HIGH (ref 70–99)
Potassium: 3.9 mmol/L (ref 3.5–5.1)
Sodium: 147 mmol/L — ABNORMAL HIGH (ref 135–145)
Total Bilirubin: 0.8 mg/dL (ref 0.3–1.2)
Total Protein: 7.2 g/dL (ref 6.5–8.1)

## 2022-05-03 LAB — MAGNESIUM: Magnesium: 2 mg/dL (ref 1.7–2.4)

## 2022-05-03 LAB — TROPONIN I (HIGH SENSITIVITY)
Troponin I (High Sensitivity): 86 ng/L — ABNORMAL HIGH (ref <18)
Troponin I (High Sensitivity): 93 ng/L — ABNORMAL HIGH (ref <18)

## 2022-05-03 LAB — PHOSPHORUS: Phosphorus: 3.8 mg/dL (ref 2.5–4.6)

## 2022-05-03 LAB — LACTIC ACID, PLASMA
Lactic Acid, Venous: 0.8 mmol/L (ref 0.5–1.9)
Lactic Acid, Venous: 1.9 mmol/L (ref 0.5–1.9)

## 2022-05-04 LAB — BLOOD GAS, ARTERIAL
Acid-Base Excess: 11.7 mmol/L — ABNORMAL HIGH (ref 0.0–2.0)
Bicarbonate: 37.4 mmol/L — ABNORMAL HIGH (ref 20.0–28.0)
O2 Saturation: 98.9 %
Patient temperature: 37
pCO2 arterial: 55 mmHg — ABNORMAL HIGH (ref 32–48)
pH, Arterial: 7.44 (ref 7.35–7.45)
pO2, Arterial: 87 mmHg (ref 83–108)

## 2022-05-04 LAB — CBC
HCT: 29.6 % — ABNORMAL LOW (ref 36.0–46.0)
Hemoglobin: 9.3 g/dL — ABNORMAL LOW (ref 12.0–15.0)
MCH: 27.8 pg (ref 26.0–34.0)
MCHC: 31.4 g/dL (ref 30.0–36.0)
MCV: 88.6 fL (ref 80.0–100.0)
Platelets: 213 10*3/uL (ref 150–400)
RBC: 3.34 MIL/uL — ABNORMAL LOW (ref 3.87–5.11)
RDW: 15.5 % (ref 11.5–15.5)
WBC: 13.8 10*3/uL — ABNORMAL HIGH (ref 4.0–10.5)
nRBC: 0 % (ref 0.0–0.2)

## 2022-05-04 LAB — COMPREHENSIVE METABOLIC PANEL
ALT: 154 U/L — ABNORMAL HIGH (ref 0–44)
AST: 115 U/L — ABNORMAL HIGH (ref 15–41)
Albumin: 3.4 g/dL — ABNORMAL LOW (ref 3.5–5.0)
Alkaline Phosphatase: 78 U/L (ref 38–126)
Anion gap: 4 — ABNORMAL LOW (ref 5–15)
BUN: 35 mg/dL — ABNORMAL HIGH (ref 8–23)
CO2: 31 mmol/L (ref 22–32)
Calcium: 9.7 mg/dL (ref 8.9–10.3)
Chloride: 109 mmol/L (ref 98–111)
Creatinine, Ser: 0.68 mg/dL (ref 0.44–1.00)
GFR, Estimated: >60 mL/min (ref >60)
Glucose, Bld: 219 mg/dL — ABNORMAL HIGH (ref 70–99)
Potassium: 5.3 mmol/L — ABNORMAL HIGH (ref 3.5–5.1)
Sodium: 144 mmol/L (ref 135–145)
Total Bilirubin: 0.5 mg/dL (ref 0.3–1.2)
Total Protein: 7 g/dL (ref 6.5–8.1)

## 2022-05-04 LAB — TROPONIN I (HIGH SENSITIVITY): Troponin I (High Sensitivity): 95 ng/L — ABNORMAL HIGH (ref <18)

## 2022-05-04 NOTE — Progress Notes (Signed)
Pulmonary [place]   PULMONARY CRITICAL CARE SERVICE  PROGRESS NOTE     Sheila Fernandez  WYO:[id number]  DOB: 06-09-1951   DOA: 04/08/2022  Referring Physician: Satira Sark, MD  HPI: Sheila Fernandez is a 71 y.o. female being followed for ventilator/airway/oxygen weaning Acute on Chronic Respiratory Failure.  Patient apparently coded yesterday and now is back on the ventilator on assist control mode full support on 35%  Medications: Reviewed on Rounds  Physical Exam:  Vitals: Temperature is 98.0 pulse 74 respiratory 18 blood pressure 140/68 saturations 100%  Ventilator Settings assist control FiO2 35% tidal volume 400 PEEP 5  General: Comfortable at this time Neck: supple Cardiovascular: no malignant arrhythmias Respiratory: Scattered rhonchi expansion equal Skin: no rash seen on limited exam Musculoskeletal: No gross abnormality Psychiatric:unable to assess Neurologic:no involuntary movements         Lab Data:   Basic Metabolic Panel: Recent Labs  Lab 04/28/22 0740 05/01/22 0411 05/03/22 1733 05/04/22 0143  NA 145 142 147* 144  K 4.2 4.2 3.9 5.3*  CL 107 108 109 109  CO2 33* '28 31 31  '$ GLUCOSE 64* 127* 153* 219*  BUN 59* 36* 30* 35*  CREATININE 1.19* 0.85 0.56 0.68  CALCIUM 9.8 9.5 9.8 9.7  MG 2.3 2.0 2.0  --   PHOS  --   --  3.8  --     ABG: Recent Labs  Lab 05/03/22 1714  PHART 7.32*  PCO2ART 72*  PO2ART 127*  HCO3 37.1*  O2SAT 99.6    Liver Function Tests: Recent Labs  Lab 05/03/22 1733 05/04/22 0143  AST 158* 115*  ALT 179* 154*  ALKPHOS 81 78  BILITOT 0.8 0.5  PROT 7.2 7.0  ALBUMIN 3.4* 3.4*   No results for input(s): "LIPASE", "AMYLASE" in the last 168 hours. No results for input(s): "AMMONIA" in the last 168 hours.  CBC: Recent Labs  Lab 04/28/22 0740 04/29/22 0355 05/01/22 0411 05/04/22 0143  WBC 8.0 7.5 5.5 13.8*  HGB 8.2* 8.2* 8.2* 9.3*  HCT 26.1* 26.1* 26.2*  29.6*  MCV 88.2 88.2 87.6 88.6  PLT 136* 136* 155 213    Cardiac Enzymes: No results for input(s): "CKTOTAL", "CKMB", "CKMBINDEX", "TROPONINI" in the last 168 hours.  BNP (last 3 results) Recent Labs    04/08/22 0336  BNP 51.6    ProBNP (last 3 results) No results for input(s): "PROBNP" in the last 8760 hours.  Radiological Exams: DG Chest Port 1 View  Result Date: 05/03/2022 CLINICAL DATA:  [id number] weakness EXAM: PORTABLE CHEST 1 VIEW COMPARISON:  April 17, 2022 FINDINGS: The cardiomediastinal silhouette is unchanged in contour.Tracheostomy. The enteric tube courses through the chest to the abdomen beyond the field-of-view. No pleural effusion. No pneumothorax. Similar appearance of a RIGHT greater than LEFT lower lobe opacity along the paramediastinal border. No acute pleuroparenchymal abnormality. Visualized abdomen is unremarkable. IMPRESSION: Similar appearance of bibasilar opacities. Electronically Signed   By: Valentino Saxon M.D.   On: 05/03/2022 17:16    Assessment/Plan Active Problems:   Sickle cell trait (HCC)   Acute on chronic respiratory failure with hypoxia (HCC)   Pleural effusion, right   Tracheostomy status (HCC)   Healthcare-associated pneumonia   Acute on chronic respiratory failure hypoxia holding on the weaning after the CODE BLUE Pleural effusion supportive care follow-up x-ray showing some areas of atelectasis Sickle cell trait supportive care Tracheostomy remains in place Healthcare associated pneumonia has been treated with antibiotics improving  clinically   I have personally seen and evaluated the patient, evaluated laboratory and imaging results, formulated the assessment and plan and placed orders. The Patient requires high complexity decision making with multiple systems involvement.  Rounds were done with the Respiratory Therapy Director and Staff therapists and discussed with nursing staff also.  Allyne Gee, MD [facility] Pulmonary Critical Care  Medicine Sleep Medicine

## 2022-05-05 ENCOUNTER — Other Ambulatory Visit (HOSPITAL_COMMUNITY): Payer: Medicare Other

## 2022-05-05 HISTORY — PX: IR GASTROSTOMY TUBE MOD SED: IMG625

## 2022-05-05 LAB — COMPREHENSIVE METABOLIC PANEL
ALT: 88 U/L — ABNORMAL HIGH (ref 0–44)
AST: 34 U/L (ref 15–41)
Albumin: 3.4 g/dL — ABNORMAL LOW (ref 3.5–5.0)
Alkaline Phosphatase: 76 U/L (ref 38–126)
Anion gap: 10 (ref 5–15)
BUN: 29 mg/dL — ABNORMAL HIGH (ref 8–23)
CO2: 30 mmol/L (ref 22–32)
Calcium: 10.3 mg/dL (ref 8.9–10.3)
Chloride: 110 mmol/L (ref 98–111)
Creatinine, Ser: 0.59 mg/dL (ref 0.44–1.00)
GFR, Estimated: >60 mL/min (ref >60)
Glucose, Bld: 90 mg/dL (ref 70–99)
Potassium: 4.3 mmol/L (ref 3.5–5.1)
Sodium: 150 mmol/L — ABNORMAL HIGH (ref 135–145)
Total Bilirubin: 0.7 mg/dL (ref 0.3–1.2)
Total Protein: 6.8 g/dL (ref 6.5–8.1)

## 2022-05-05 MED ORDER — FENTANYL CITRATE (PF) 100 MCG/2ML IJ SOLN
INTRAMUSCULAR | Status: AC
  Filled 2022-05-05: qty 2

## 2022-05-05 MED ORDER — LORAZEPAM 2 MG/ML IJ SOLN: INTRAMUSCULAR | Status: AC | PRN

## 2022-05-05 MED ORDER — IOHEXOL 300 MG/ML  SOLN
100.0000 mL | Freq: Once | INTRAMUSCULAR | Status: AC | PRN
  Administered 2022-05-05: 20 mL

## 2022-05-05 MED ORDER — MIDAZOLAM HCL 2 MG/2ML IJ SOLN
INTRAMUSCULAR | Status: AC
  Filled 2022-05-05: qty 2

## 2022-05-05 MED ORDER — MIDAZOLAM HCL 2 MG/2ML IJ SOLN
INTRAMUSCULAR | Status: AC | PRN
  Administered 2022-05-05: .5 mg via INTRAVENOUS
  Administered 2022-05-05: 1 mg via INTRAVENOUS

## 2022-05-05 MED ORDER — CEFAZOLIN SODIUM-DEXTROSE 2-4 GM/100ML-% IV SOLN
INTRAVENOUS | Status: AC
  Filled 2022-05-05: qty 100

## 2022-05-05 MED ORDER — FENTANYL CITRATE (PF) 100 MCG/2ML IJ SOLN
INTRAMUSCULAR | Status: AC | PRN
  Administered 2022-05-05 (×2): 25 ug via INTRAVENOUS

## 2022-05-05 MED ORDER — LIDOCAINE HCL 1 % IJ SOLN
INTRAMUSCULAR | Status: AC
  Administered 2022-05-05: 10 mL
  Filled 2022-05-05: qty 20

## 2022-05-05 NOTE — Sedation Documentation (Signed)
Rep[ort given to 5E RN 

## 2022-05-05 NOTE — Sedation Documentation (Signed)
Vital signs stable. 

## 2022-05-05 NOTE — Sedation Documentation (Signed)
Pt  tolerated procedure well.

## 2022-05-05 NOTE — Sedation Documentation (Signed)
Patient is resting comfortably. Procedure started °

## 2022-05-05 NOTE — Progress Notes (Signed)
Pulmonary [place]   PULMONARY CRITICAL CARE SERVICE  PROGRESS NOTE     Sheila Fernandez  AJO:[id number]  DOB: Jun 07, 1951   DOA: 04/08/2022  Referring Physician: Satira Sark, MD  HPI: Sheila Fernandez is a 71 y.o. female being followed for ventilator/airway/oxygen weaning Acute on Chronic Respiratory Failure.  Patient is on assist-control mode has been on 28% FiO2 currently on PEEP of 5 appears to be comfortable without distress  Medications: Reviewed on Rounds  Physical Exam:  Vitals: Temperature is 97.3 pulse 81 respiratory rate 17 blood pressure 148/83 saturation is 100%  Ventilator Settings assist-control FiO2 is 28% tidal volume 400 PEEP of 5  General: Comfortable at this time Neck: supple Cardiovascular: no malignant arrhythmias Respiratory: Scattered coarse rhonchi noted bilaterally Skin: no rash seen on limited exam Musculoskeletal: No gross abnormality Psychiatric:unable to assess Neurologic:no involuntary movements         Lab Data:   Basic Metabolic Panel: Recent Labs  Lab 05/01/22 0411 05/03/22 1733 05/04/22 0143 05/05/22 1105  NA 142 147* 144 150*  K 4.2 3.9 5.3* 4.3  CL 108 109 109 110  CO2 '28 31 31 30  '$ GLUCOSE 127* 153* 219* 90  BUN 36* 30* 35* 29*  CREATININE 0.85 0.56 0.68 0.59  CALCIUM 9.5 9.8 9.7 10.3  MG 2.0 2.0  --   --   PHOS  --  3.8  --   --     ABG: Recent Labs  Lab 05/03/22 1714 05/04/22 1245  PHART 7.32* 7.44  PCO2ART 72* 55*  PO2ART 127* 87  HCO3 37.1* 37.4*  O2SAT 99.6 98.9    Liver Function Tests: Recent Labs  Lab 05/03/22 1733 05/04/22 0143 05/05/22 1105  AST 158* 115* 34  ALT 179* 154* 88*  ALKPHOS 81 78 76  BILITOT 0.8 0.5 0.7  PROT 7.2 7.0 6.8  ALBUMIN 3.4* 3.4* 3.4*   No results for input(s): "LIPASE", "AMYLASE" in the last 168 hours. No results for input(s): "AMMONIA" in the last 168 hours.  CBC: Recent Labs  Lab 04/29/22 0355 05/01/22 0411  05/04/22 0143  WBC 7.5 5.5 13.8*  HGB 8.2* 8.2* 9.3*  HCT 26.1* 26.2* 29.6*  MCV 88.2 87.6 88.6  PLT 136* 155 213    Cardiac Enzymes: No results for input(s): "CKTOTAL", "CKMB", "CKMBINDEX", "TROPONINI" in the last 168 hours.  BNP (last 3 results) Recent Labs    04/08/22 0336  BNP 51.6    ProBNP (last 3 results) No results for input(s): "PROBNP" in the last 8760 hours.  Radiological Exams: DG Chest Port 1 View  Result Date: 05/03/2022 CLINICAL DATA:  [id number] weakness EXAM: PORTABLE CHEST 1 VIEW COMPARISON:  April 17, 2022 FINDINGS: The cardiomediastinal silhouette is unchanged in contour.Tracheostomy. The enteric tube courses through the chest to the abdomen beyond the field-of-view. No pleural effusion. No pneumothorax. Similar appearance of a RIGHT greater than LEFT lower lobe opacity along the paramediastinal border. No acute pleuroparenchymal abnormality. Visualized abdomen is unremarkable. IMPRESSION: Similar appearance of bibasilar opacities. Electronically Signed   By: Valentino Saxon M.D.   On: 05/03/2022 17:16    Assessment/Plan Active Problems:   Sickle cell trait (HCC)   Acute on chronic respiratory failure with hypoxia (HCC)   Pleural effusion, right   Tracheostomy status (HCC)   Healthcare-associated pneumonia   Acute on chronic respiratory failure hypoxia will continue with assist control mode for now patient's RSBI mechanics will be assessed to try to see if ready for  weaning again Sickle cell trait supportive care Pleural effusion no change Tracheostomy remains in place Healthcare associated pneumonia chest x-ray results as above   I have personally seen and evaluated the patient, evaluated laboratory and imaging results, formulated the assessment and plan and placed orders. The Patient requires high complexity decision making with multiple systems involvement.  Rounds were done with the Respiratory Therapy Director and Staff therapists and discussed with  nursing staff also.  Allyne Gee, MD [facility] Pulmonary Critical Care Medicine Sleep Medicine

## 2022-05-05 NOTE — Procedures (Signed)
Interventional Radiology Procedure Note  Procedure: Placement of percutaneous 20F pull-through gastrostomy tube. Complications: None Recommendations: - NPO except for sips and chips remainder of today and overnight - Maintain G-tube to LWS until tomorrow morning  - May advance diet as tolerated and begin using tube tomorrow morning  Signed,   Nykiah Ma S. Pao Haffey, DO   

## 2022-05-06 LAB — BASIC METABOLIC PANEL
Anion gap: 7 (ref 5–15)
BUN: 21 mg/dL (ref 8–23)
CO2: 30 mmol/L (ref 22–32)
Calcium: 9.8 mg/dL (ref 8.9–10.3)
Chloride: 108 mmol/L (ref 98–111)
Creatinine, Ser: 0.56 mg/dL (ref 0.44–1.00)
GFR, Estimated: >60 mL/min (ref >60)
Glucose, Bld: 138 mg/dL — ABNORMAL HIGH (ref 70–99)
Potassium: 4.6 mmol/L (ref 3.5–5.1)
Sodium: 145 mmol/L (ref 135–145)

## 2022-05-06 NOTE — Progress Notes (Signed)
Patient seen for g-tube site s/p placement of  20 Fr pull-through gastrostomy tube on 8.7.23 in IR with Dr. Earleen Newport   Insertion site is clean, dry, dressed and appropriately tender to palpation. Tube is to low intermittent wall suction on exam. No bleeding or leakage. Per staff tube has not yet been used.    Ok to begin using g-tube for medicines, tube feeds, free water, etc.  Please call IR for any questions or concerns regarding the g-tube.

## 2022-05-06 NOTE — Progress Notes (Signed)
Pulmonary [place]   PULMONARY CRITICAL CARE SERVICE  PROGRESS NOTE     KASHEENA SAMBRANO  ZRA:[id number]  DOB: 09-Jan-1951   DOA: 04/08/2022  Referring Physician: Satira Sark, MD  HPI: Margueritte ALESA ECHEVARRIA is a 71 y.o. female being followed for ventilator/airway/oxygen weaning Acute on Chronic Respiratory Failure.  Patient is comfortable at this time without distress has been doing 16 hours of pressure support  Medications: Reviewed on Rounds  Physical Exam:  Vitals: Temperature is 96.7 pulse 97 respiratory rate is 27 blood pressure 154/90 saturations 98 percent  Ventilator Settings assist-control FiO2 28% PEEP of 5  General: Comfortable at this time Neck: supple Cardiovascular: no malignant arrhythmias Respiratory: No rhonchi coarse breath sounds Skin: no rash seen on limited exam Musculoskeletal: No gross abnormality Psychiatric:unable to assess Neurologic:no involuntary movements         Lab Data:   Basic Metabolic Panel: Recent Labs  Lab 05/01/22 0411 05/03/22 1733 05/04/22 0143 05/05/22 1105 05/06/22 0620  NA 142 147* 144 150* 145  K 4.2 3.9 5.3* 4.3 4.6  CL 108 109 109 110 108  CO2 '28 31 31 30 30  '$ GLUCOSE 127* 153* 219* 90 138*  BUN 36* 30* 35* 29* 21  CREATININE 0.85 0.56 0.68 0.59 0.56  CALCIUM 9.5 9.8 9.7 10.3 9.8  MG 2.0 2.0  --   --   --   PHOS  --  3.8  --   --   --     ABG: Recent Labs  Lab 05/03/22 1714 05/04/22 1245  PHART 7.32* 7.44  PCO2ART 72* 55*  PO2ART 127* 87  HCO3 37.1* 37.4*  O2SAT 99.6 98.9    Liver Function Tests: Recent Labs  Lab 05/03/22 1733 05/04/22 0143 05/05/22 1105  AST 158* 115* 34  ALT 179* 154* 88*  ALKPHOS 81 78 76  BILITOT 0.8 0.5 0.7  PROT 7.2 7.0 6.8  ALBUMIN 3.4* 3.4* 3.4*   No results for input(s): "LIPASE", "AMYLASE" in the last 168 hours. No results for input(s): "AMMONIA" in the last 168 hours.  CBC: Recent Labs  Lab 05/01/22 0411  05/04/22 0143  WBC 5.5 13.8*  HGB 8.2* 9.3*  HCT 26.2* 29.6*  MCV 87.6 88.6  PLT 155 213    Cardiac Enzymes: No results for input(s): "CKTOTAL", "CKMB", "CKMBINDEX", "TROPONINI" in the last 168 hours.  BNP (last 3 results) Recent Labs    04/08/22 0336  BNP 51.6    ProBNP (last 3 results) No results for input(s): "PROBNP" in the last 8760 hours.  Radiological Exams: IR GASTROSTOMY TUBE MOD SED  Result Date: 05/05/2022 INDICATION: 71 year old female referred for percutaneous gastrostomy EXAM: PERC PLACEMENT GASTROSTOMY MEDICATIONS: 2 g Ancef; Antibiotics were administered within 1 hour of the procedure. ANESTHESIA/SEDATION: Versed 1.5 mg IV; Fentanyl 50 mcg IV Moderate Sedation Time:  15 minutes The patient was continuously monitored during the procedure by the interventional radiology nurse under my direct supervision. CONTRAST:  59m OMNIPAQUE IOHEXOL 300 MG/ML SOLN - administered into the gastric lumen. FLUOROSCOPY: Radiation Exposure Index (as provided by the fluoroscopic device): 7 mGy Kerma COMPLICATIONS: None PROCEDURE: Informed written consent was obtained from the patient's family after a thorough discussion of the procedural risks, benefits and alternatives. All questions were addressed. Maximal Sterile Barrier Technique was utilized including caps, mask, sterile gowns, sterile gloves, sterile drape, hand hygiene and skin antiseptic. A timeout was performed prior to the initiation of the procedure. The procedure, risks, benefits, and alternatives were  explained to the patient. Questions regarding the procedure were encouraged and answered. The patient understands and consents to the procedure. The epigastrium was prepped with Betadine in a sterile fashion, and a sterile drape was applied covering the operative field. A sterile gown and sterile gloves were used for the procedure. A 5-French orogastric tube is placed under fluoroscopic guidance. Scout imaging of the abdomen confirms  barium within the transverse colon. The stomach was distended with gas. Under fluoroscopic guidance, an 18 gauge needle was utilized to puncture the anterior wall of the body of the stomach. An Amplatz wire was advanced through the needle passing a T fastener into the lumen of the stomach. The T fastener was secured for gastropexy. A 9-French sheath was inserted. A snare was advanced through the 9-French sheath. A Britta Mccreedy was advanced through the orogastric tube. It was snared then pulled out the oral cavity, pulling the snare, as well. The leading edge of the gastrostomy was attached to the snare. It was then pulled down the esophagus and out the percutaneous site. It was secured in place. Contrast was injected. No complication IMPRESSION: Status post fluoroscopic placed percutaneous gastrostomy tube, with 20 [place] pull-through. Signed, Dulcy Fanny. Nadene Rubins, RPVI [facility] Inter[facility] Radiology [facility] Radiology Electronically Signed   By: Corrie Mckusick D.O.   On: 05/05/2022 16:36    Assessment/Plan Active Problems:   Sickle cell trait (HCC)   Acute on chronic respiratory failure with hypoxia (HCC)   Pleural effusion, right   Tracheostomy status ([place])   Healthcare-associated pneumonia   Acute on chronic respiratory failure hypoxia patient is going to wean on pressure support once again.  Will continue to advance as tolerated Sickle cell trait no change we will continue to follow along Tracheostomy remains in place Healthcare associated pneumonia has been treated Pleural effusion at baseline follow-up x-rays   I have personally seen and evaluated the patient, evaluated laboratory and imaging results, formulated the assessment and plan and placed orders. The Patient requires high complexity decision making with multiple systems involvement.  Rounds were done with the Respiratory Therapy Director and Staff therapists and discussed with nursing staff also.  Allyne Gee,  MD [facility] Pulmonary Critical Care Medicine Sleep Medicine

## 2022-05-07 NOTE — Progress Notes (Signed)
Pulmonary [place]   PULMONARY CRITICAL CARE SERVICE  PROGRESS NOTE     Sheila Fernandez  PYP:[id number]  DOB: [birth year]   DOA: 04/08/2022  Referring Physician: Satira Sark, MD  HPI: Sheila Fernandez is a 71 y.o. female being followed for ventilator/airway/oxygen weaning Acute on Chronic Respiratory Failure.  Patient is on the ventilator full support assist control mode supposed to do 16 hours of pressure support today  Medications: Reviewed on Rounds  Physical Exam:  Vitals: Temperature 96.9 pulse of 64 respiratory 23 blood pressure 120/79 saturations 100%  Ventilator Settings assist-control FiO2 is 35% tidal volume 400 PEEP 5  General: Comfortable at this time Neck: supple Cardiovascular: no malignant arrhythmias Respiratory: Scattered rhonchi very coarse breath sound Skin: no rash seen on limited exam Musculoskeletal: No gross abnormality Psychiatric:unable to assess Neurologic:no involuntary movements         Lab Data:   Basic Metabolic Panel: Recent Labs  Lab 05/01/22 0411 05/03/22 1733 05/04/22 0143 05/05/22 1105 05/06/22 0620  NA 142 147* 144 150* 145  K 4.2 3.9 5.3* 4.3 4.6  CL 108 109 109 110 108  CO2 '28 31 31 30 30  '$ GLUCOSE 127* 153* 219* 90 138*  BUN 36* 30* 35* 29* 21  CREATININE 0.85 0.56 0.68 0.59 0.56  CALCIUM 9.5 9.8 9.7 10.3 9.8  MG 2.0 2.0  --   --   --   PHOS  --  3.8  --   --   --     ABG: Recent Labs  Lab 05/03/22 1714 05/04/22 1245  PHART 7.32* 7.44  PCO2ART 72* 55*  PO2ART 127* 87  HCO3 37.1* 37.4*  O2SAT 99.6 98.9    Liver Function Tests: Recent Labs  Lab 05/03/22 1733 05/04/22 0143 05/05/22 1105  AST 158* 115* 34  ALT 179* 154* 88*  ALKPHOS 81 78 76  BILITOT 0.8 0.5 0.7  PROT 7.2 7.0 6.8  ALBUMIN 3.4* 3.4* 3.4*   No results for input(s): "LIPASE", "AMYLASE" in the last 168 hours. No results for input(s): "AMMONIA" in the last 168 hours.  CBC: Recent  Labs  Lab 05/01/22 0411 05/04/22 0143  WBC 5.5 13.8*  HGB 8.2* 9.3*  HCT 26.2* 29.6*  MCV 87.6 88.6  PLT 155 213    Cardiac Enzymes: No results for input(s): "CKTOTAL", "CKMB", "CKMBINDEX", "TROPONINI" in the last 168 hours.  BNP (last 3 results) Recent Labs    04/08/22 0336  BNP 51.6    ProBNP (last 3 results) No results for input(s): "PROBNP" in the last 8760 hours.  Radiological Exams: IR GASTROSTOMY TUBE MOD SED  Result Date: 05/05/2022 INDICATION: 71 year old female referred for percutaneous gastrostomy EXAM: PERC PLACEMENT GASTROSTOMY MEDICATIONS: 2 g Ancef; Antibiotics were administered within 1 hour of the procedure. ANESTHESIA/SEDATION: Versed 1.5 mg IV; Fentanyl 50 mcg IV Moderate Sedation Time:  15 minutes The patient was continuously monitored during the procedure by the interventional radiology nurse under my direct supervision. CONTRAST:  71m OMNIPAQUE IOHEXOL 300 MG/ML SOLN - administered into the gastric lumen. FLUOROSCOPY: Radiation Exposure Index (as provided by the fluoroscopic device): 7 mGy Kerma COMPLICATIONS: None PROCEDURE: Informed written consent was obtained from the patient's family after a thorough discussion of the procedural risks, benefits and alternatives. All questions were addressed. Maximal Sterile Barrier Technique was utilized including caps, mask, sterile gowns, sterile gloves, sterile drape, hand hygiene and skin antiseptic. A timeout was performed prior to the initiation of the procedure. The procedure, risks,  benefits, and alternatives were explained to the patient. Questions regarding the procedure were encouraged and answered. The patient understands and consents to the procedure. The epigastrium was prepped with Betadine in a sterile fashion, and a sterile drape was applied covering the operative field. A sterile gown and sterile gloves were used for the procedure. A 5-French orogastric tube is placed under fluoroscopic guidance. Scout imaging  of the abdomen confirms barium within the transverse colon. The stomach was distended with gas. Under fluoroscopic guidance, an 18 gauge needle was utilized to puncture the anterior wall of the body of the stomach. An Amplatz wire was advanced through the needle passing a T fastener into the lumen of the stomach. The T fastener was secured for gastropexy. A 9-French sheath was inserted. A snare was advanced through the 9-French sheath. A Britta Mccreedy was advanced through the orogastric tube. It was snared then pulled out the oral cavity, pulling the snare, as well. The leading edge of the gastrostomy was attached to the snare. It was then pulled down the esophagus and out the percutaneous site. It was secured in place. Contrast was injected. No complication IMPRESSION: Status post fluoroscopic placed percutaneous gastrostomy tube, with 20 [place] pull-through. Signed, Dulcy Fanny. Nadene Rubins, RPVI [facility]ascular and Interventional Radiology Specialists [facility] Radiology Electronically Signed   By: Corrie Mckusick D.O.   On: 05/05/2022 16:36    Assessment/Plan Active Problems:   Sickle cell trait (HCC)   Acute on chronic respiratory failure with hypoxia (HCC)   Pleural effusion, right   Tracheostomy status (HCC)   Healthcare-associated pneumonia   Acute on chronic respiratory failure hypoxia we will continue with try to wean on pressure support as tolerated continue secretion management pulmonary toilet. Sickle cell trait no change Pleural effusion following on x-rays Tracheostomy remains in place Healthcare associated pneumonia has been treated   I have personally seen and evaluated the patient, evaluated laboratory and imaging results, formulated the assessment and plan and placed orders. The Patient requires high complexity decision making with multiple systems involvement.  Rounds were done with the Respiratory Therapy Director and Staff therapists and discussed with nursing staff also.  Allyne Gee, MD [facility] Pulmonary Critical Care Medicine Sleep Medicine

## 2022-05-08 LAB — CBC
HCT: 26.2 % — ABNORMAL LOW (ref 36.0–46.0)
Hemoglobin: 8.5 g/dL — ABNORMAL LOW (ref 12.0–15.0)
MCH: 27.4 pg (ref 26.0–34.0)
MCHC: 32.4 g/dL (ref 30.0–36.0)
MCV: 84.5 fL (ref 80.0–100.0)
Platelets: 196 10*3/uL (ref 150–400)
RBC: 3.1 MIL/uL — ABNORMAL LOW (ref 3.87–5.11)
RDW: 15.5 % (ref 11.5–15.5)
WBC: 9.5 10*3/uL (ref 4.0–10.5)
nRBC: 0 % (ref 0.0–0.2)

## 2022-05-08 LAB — COMPREHENSIVE METABOLIC PANEL
ALT: 42 U/L (ref 0–44)
AST: 18 U/L (ref 15–41)
Albumin: 3 g/dL — ABNORMAL LOW (ref 3.5–5.0)
Alkaline Phosphatase: 74 U/L (ref 38–126)
Anion gap: 6 (ref 5–15)
BUN: 36 mg/dL — ABNORMAL HIGH (ref 8–23)
CO2: 33 mmol/L — ABNORMAL HIGH (ref 22–32)
Calcium: 9.9 mg/dL (ref 8.9–10.3)
Chloride: 102 mmol/L (ref 98–111)
Creatinine, Ser: 0.73 mg/dL (ref 0.44–1.00)
GFR, Estimated: >60 mL/min (ref >60)
Glucose, Bld: 86 mg/dL (ref 70–99)
Potassium: 3.8 mmol/L (ref 3.5–5.1)
Sodium: 141 mmol/L (ref 135–145)
Total Bilirubin: 0.5 mg/dL (ref 0.3–1.2)
Total Protein: 6.4 g/dL — ABNORMAL LOW (ref 6.5–8.1)

## 2022-05-08 LAB — MAGNESIUM: Magnesium: 1.8 mg/dL (ref 1.7–2.4)

## 2022-05-08 NOTE — Progress Notes (Signed)
Pulmonary [place]   PULMONARY CRITICAL CARE SERVICE  PROGRESS NOTE     Sheila Fernandez  DJS:[id number]  DOB: 02-Oct-1950   DOA: 04/08/2022  Referring Physician: Satira Sark, MD  HPI: Sheila Fernandez is a 71 y.o. female being followed for ventilator/airway/oxygen weaning Acute on Chronic Respiratory Failure.  Patient is on assist control mode currently on 28% FiO2 did 16 hours of pressure support yesterday  Medications: Reviewed on Rounds  Physical Exam:  Vitals: Temperature is 97.2 pulse 72 respiratory is 18 blood pressure is 146/86 saturations 97%  Ventilator Settings assist-control FiO2 28% tidal volume 400 PEEP 5  General: Comfortable at this time Neck: supple Cardiovascular: no malignant arrhythmias Respiratory: Scattered rhonchi expansion is equal Skin: no rash seen on limited exam Musculoskeletal: No gross abnormality Psychiatric:unable to assess Neurologic:no involuntary movements         Lab Data:   Basic Metabolic Panel: Recent Labs  Lab 05/03/22 1733 05/04/22 0143 05/05/22 1105 05/06/22 0620 05/08/22 0518  NA 147* 144 150* 145 141  K 3.9 5.3* 4.3 4.6 3.8  CL 109 109 110 108 102  CO2 '31 31 30 30 '$ 33*  GLUCOSE 153* 219* 90 138* 86  BUN 30* 35* 29* 21 36*  CREATININE 0.56 0.68 0.59 0.56 0.73  CALCIUM 9.8 9.7 10.3 9.8 9.9  MG 2.0  --   --   --  1.8  PHOS 3.8  --   --   --   --     ABG: Recent Labs  Lab 05/03/22 1714 05/04/22 1245  PHART 7.32* 7.44  PCO2ART 72* 55*  PO2ART 127* 87  HCO3 37.1* 37.4*  O2SAT 99.6 98.9    Liver Function Tests: Recent Labs  Lab 05/03/22 1733 05/04/22 0143 05/05/22 1105 05/08/22 0518  AST 158* 115* 34 18  ALT 179* 154* 88* 42  ALKPHOS 81 78 76 74  BILITOT 0.8 0.5 0.7 0.5  PROT 7.2 7.0 6.8 6.4*  ALBUMIN 3.4* 3.4* 3.4* 3.0*   No results for input(s): "LIPASE", "AMYLASE" in the last 168 hours. No results for input(s): "AMMONIA" in the last 168  hours.  CBC: Recent Labs  Lab 05/04/22 0143 05/08/22 0518  WBC 13.8* 9.5  HGB 9.3* 8.5*  HCT 29.6* 26.2*  MCV 88.6 84.5  PLT 213 196    Cardiac Enzymes: No results for input(s): "CKTOTAL", "CKMB", "CKMBINDEX", "TROPONINI" in the last 168 hours.  BNP (last 3 results) Recent Labs    04/08/22 0336  BNP 51.6    ProBNP (last 3 results) No results for input(s): "PROBNP" in the last 8760 hours.  Radiological Exams: No results found.  Assessment/Plan Active Problems:   Sickle cell trait (HCC)   Acute on chronic respiratory failure with hypoxia (HCC)   Pleural effusion, right   Tracheostomy status ([place])   Healthcare-associated pneumonia   Acute on chronic respiratory failure with hypoxia we will continue with weaning supposed to start with T collar today Sickle cell trait no change we will continue to follow-up Tracheostomy remains in place Healthcare associated pneumonia has been treated Pleural effusion supportive care follow-up x-rays   I have personally seen and evaluated the patient, evaluated laboratory and imaging results, formulated the assessment and plan and placed orders. The Patient requires high complexity decision making with multiple systems involvement.  Rounds were done with the Respiratory Therapy Director and Staff therapists and discussed with nursing staff also.  Allyne Gee, MD [facility] Pulmonary Critical Care Medicine Sleep  Medicine

## 2022-05-09 NOTE — Progress Notes (Signed)
Pulmonary [place]   PULMONARY CRITICAL CARE SERVICE  PROGRESS NOTE     Sheila Fernandez  RKY:[id number]  DOB: 1950/10/03   DOA: 04/08/2022  Referring Physician: Satira Sark, MD  HPI: Sheila Fernandez is a 71 y.o. female being followed for ventilator/airway/oxygen weaning Acute on Chronic Respiratory Failure.  Patient did 2 hours of T collar yesterday supposed to be doing 4 hours today  Medications: Reviewed on Rounds  Physical Exam:  Vitals: Temperature is 97.3 pulse 70 respiratory 20 blood pressure 135/70 saturations 100%  Ventilator Settings currently on pressure support FiO2 28% pressure 12/5  General: Comfortable at this time Neck: supple Cardiovascular: no malignant arrhythmias Respiratory: Scattered coarse rhonchi are noted bilaterally Skin: no rash seen on limited exam Musculoskeletal: No gross abnormality Psychiatric:unable to assess Neurologic:no involuntary movements         Lab Data:   Basic Metabolic Panel: Recent Labs  Lab 05/03/22 1733 05/04/22 0143 05/05/22 1105 05/06/22 0620 05/08/22 0518  NA 147* 144 150* 145 141  K 3.9 5.3* 4.3 4.6 3.8  CL 109 109 110 108 102  CO2 '31 31 30 30 '$ 33*  GLUCOSE 153* 219* 90 138* 86  BUN 30* 35* 29* 21 36*  CREATININE 0.56 0.68 0.59 0.56 0.73  CALCIUM 9.8 9.7 10.3 9.8 9.9  MG 2.0  --   --   --  1.8  PHOS 3.8  --   --   --   --     ABG: Recent Labs  Lab 05/03/22 1714 05/04/22 1245  PHART 7.32* 7.44  PCO2ART 72* 55*  PO2ART 127* 87  HCO3 37.1* 37.4*  O2SAT 99.6 98.9    Liver Function Tests: Recent Labs  Lab 05/03/22 1733 05/04/22 0143 05/05/22 1105 05/08/22 0518  AST 158* 115* 34 18  ALT 179* 154* 88* 42  ALKPHOS 81 78 76 74  BILITOT 0.8 0.5 0.7 0.5  PROT 7.2 7.0 6.8 6.4*  ALBUMIN 3.4* 3.4* 3.4* 3.0*   No results for input(s): "LIPASE", "AMYLASE" in the last 168 hours. No results for input(s): "AMMONIA" in the last 168  hours.  CBC: Recent Labs  Lab 05/04/22 0143 05/08/22 0518  WBC 13.8* 9.5  HGB 9.3* 8.5*  HCT 29.6* 26.2*  MCV 88.6 84.5  PLT 213 196    Cardiac Enzymes: No results for input(s): "CKTOTAL", "CKMB", "CKMBINDEX", "TROPONINI" in the last 168 hours.  BNP (last 3 results) Recent Labs    04/08/22 0336  BNP 51.6    ProBNP (last 3 results) No results for input(s): "PROBNP" in the last 8760 hours.  Radiological Exams: No results found.  Assessment/Plan Active Problems:   Sickle cell trait (HCC)   Acute on chronic respiratory failure with hypoxia (HCC)   Pleural effusion, right   Tracheostomy status (HCC)   Healthcare-associated pneumonia   Chronic respiratory failure hypoxia plan is going to be to wean on T collar for up to 4 hours today Sickle cell trait no change we will continue to monitor along closely. Tracheostomy remains in place Healthcare associated pneumonia has been treated Pleural effusion will continue to monitor patient's fluid status   I have personally seen and evaluated the patient, evaluated laboratory and imaging results, formulated the assessment and plan and placed orders. The Patient requires high complexity decision making with multiple systems involvement.  Rounds were done with the Respiratory Therapy Director and Staff therapists and discussed with nursing staff also.  Allyne Gee, MD [facility] Pulmonary Critical  Care Medicine Sleep Medicine

## 2022-05-10 ENCOUNTER — Other Ambulatory Visit (HOSPITAL_COMMUNITY): Payer: Medicare Other

## 2022-05-10 NOTE — Consult Note (Signed)
Referring Physician: S. Owens Shark, MD  Sheila Fernandez is an 71 y.o. female.                       Chief Complaint: Hypertension, uncontrolled in patient with Anemia and Sickle cell  HPI: 71 years old female with acute on chronic respiratory failure, s/p tracheostomy, Sickle cell anemia and arthritis has hypertension treated with hydralazine, amlodipine and metoprolol.  Past Medical History:  Diagnosis Date   Adenomyosis    Anemia    Arthritis    Hypertension    Iritis, recurrent    Orthostatic hypotension 01/21/2019   Sickle cell trait ([place])    Uterine fibroid       Past Surgical History:  Procedure Laterality Date   BREAST BIOPSY Left 1986   benign excisional no scar seen    BREAST LUMPECTOMY Left    Pt does not recall having lumpectomy   CESAREAN SECTION     CHOLECYSTECTOMY     FOOT SURGERY     cyst removed   IR GASTROSTOMY TUBE MOD SED  05/05/2022    Family History  Problem Relation Age of Onset   Cancer Mother 71       Pancreatic   Hypertension Mother    Stroke Father    Cancer Father 67       Prostate   Hypertension Father    Breast cancer Sister    Diabetes Sister    HIV Sister    Cancer Sister        Cancer r/t HIV   Lupus Daughter    Diabetes Maternal Grandmother    Diabetes Brother    Hypertension Brother    Kidney disease Brother    Cancer Brother        Prostate   Colon cancer Neg Hx    Esophageal cancer Neg Hx    Stomach cancer Neg Hx    Colon polyps Neg Hx    Social History:  reports that she quit smoking about 18 years ago. Her smoking use included cigarettes. She has a 15.00 pack-year smoking history. She has never used smokeless tobacco. She reports that she does not drink alcohol and does not use drugs.  Allergies:  Allergies  Allergen Reactions   Ativan [Lorazepam] Other (See Comments)    Severe lethargy and unable to remember events    Codeine Nausea And Vomiting    Medications Prior to Admission  Medication Sig Dispense Refill    albuterol (PROVENTIL) (2.5 MG/3ML) 0.083% nebulizer solution Take 3 mLs (2.5 mg total) by nebulization every 4 (four) hours as needed for wheezing or shortness of breath. 75 mL 12   docusate (COLACE) 50 MG/5ML liquid Place 10 mLs (100 mg total) into feeding tube 2 (two) times daily as needed for mild constipation. 100 mL 0   guaiFENesin (ROBITUSSIN) 100 MG/5ML liquid Place 15 mLs into feeding tube every 6 (six) hours. 120 mL 0   lip balm (CARMEX) ointment Apply topically as needed for lip care. 7 g 0   Multiple Vitamin (MULTIVITAMIN WITH MINERALS) TABS tablet Place 1 tablet into feeding tube daily. 30 tablet 0   ondansetron (ZOFRAN) 4 MG/2ML SOLN injection Inject 2 mLs (4 mg total) into the vein every 6 (six) hours as needed for nausea. 2 mL 0   pantoprazole (PROTONIX) 40 MG tablet Take 40 mg by mouth daily.     polyethylene glycol (MIRALAX / GLYCOLAX) 17 g packet Place 17 g into feeding tube daily  as needed for moderate constipation. 14 each 0   QUEtiapine (SEROQUEL) 50 MG tablet Place 1 tablet (50 mg total) into feeding tube 2 (two) times daily. 60 tablet 0   thiamine 100 MG tablet Place 1 tablet (100 mg total) into feeding tube daily. 30 tablet 0   tobramycin, PF, (TOBI) 300 MG/5ML nebulizer solution Take 5 mLs (300 mg total) by nebulization 2 (two) times daily. 280 mL 0    No results found for this or any previous visit (from the past 48 hour(s)). DG Chest Port 1 View  Result Date: 05/10/2022 CLINICAL DATA:  Respiratory failure EXAM: PORTABLE CHEST 1 VIEW COMPARISON:  05/03/2022 FINDINGS: Tracheostomy tube in place. Gastrostomy tube balloon projects over the left upper quadrant. Previously seen transesophageal enteric tube has been removed. Heart size within normal limits. Low lung volumes. Mild bibasilar atelectasis. No new airspace consolidation. No pleural effusion or pneumothorax. IMPRESSION: Low lung volumes with mild bibasilar atelectasis. No new airspace consolidation. Electronically  Signed   By: Davina Poke D.O.   On: 05/10/2022 13:30    Review Of Systems Constitutional: H/O fever, chills, chronic weight loss. Eyes: No vision change, wears reading glasses. No discharge or pain. Ears: No hearing loss, No tinnitus. Respiratory: No asthma, COPD, positive pneumonias, shortness of breath. No hemoptysis. Cardiovascular: No chest pain, palpitation, leg edema. Gastrointestinal: Positive nausea, vomiting, no diarrhea, constipation. No GI bleed. No hepatitis. Genitourinary: No dysuria, hematuria, kidney stone. No incontinance. Neurological: No headache, stroke, seizures.  Psychiatry: No psych facility admission for anxiety, depression, suicide. No detox. Skin: No rash. Musculoskeletal: Positive joint pain, no fibromyalgia. No neck pain, back pain. Lymphadenopathy: No lymphadenopathy. Hematology: Positive anemia, no easy bruising.   Blood pressure (!) 180/82, pulse 100, resp. rate 20, SpO2 96 %. There is no height or weight on file to calculate BMI. General appearance: alert, cooperative, appears stated age and moderate respiratory distress Head: Normocephalic, atraumatic. Eyes: Brown eyes, pale conjunctiva, corneas clear.  Neck: No adenopathy, no carotid bruit, no JVD, supple, symmetrical, tracheostomy present. Resp: Coarse to auscultation bilaterally. Cardio: Regular rate and rhythm, S1, S2 normal, II/VI systolic murmur, no click, rub or gallop GI: Soft, non-tender; bowel sounds normal; no organomegaly. Extremities: No edema, cyanosis or clubbing. Skin: Warm and dry.  Neurologic: Alert and oriented X 3, normal strength.  Assessment/Plan Acute on chronic respiratory failure with hypoxia Hypertension Sickle cell anemia Pneumonia S/P tracheostomy Arthritis  Plan: Continue amlodipine and hydralazine. Add losartan 50 mg. Daily, increase up to 100 mg. If needed. Change metoprolol to carvedilol 12.5 mg. Bid for improved BP control. Add small dose isosorbide  mononitrate 15 mg. followed by 30 mg. daily  Time spent: Review of old records, Lab, x-rays, EKG, other cardiac tests, examination, discussion with patient over 70 minutes.  Birdie Riddle, MD  05/10/2022, 3:26 PM

## 2022-05-11 NOTE — Progress Notes (Cosign Needed)
Pulmonary [place]   PULMONARY CRITICAL CARE SERVICE  PROGRESS NOTE     LITSY EPTING  WVP:[id number]  DOB: [birth year]   DOA: 04/08/2022  Referring Physician: Satira Sark, MD  HPI: Sheila Fernandez is a 71 y.o. female being followed for ventilator/airway/oxygen weaning Acute on Chronic Respiratory Failure.  Patient seen lying in bed, currently on full support.  Family at bedside.  According to respiratory therapy she remains very anxious.  Does have a 2-hour ATC goal today.  No acute distress, no acute overnight events.  Medications: Reviewed on Rounds  Physical Exam:  Vitals: Temp 96.8, pulse 69, respirations 24, BP 142/81, SPO2 100%  Ventilator Settings AC VC plus, FiO2 20%, tidal volume 400, rate 12, PEEP 5  General: Comfortable at this time Neck: supple Cardiovascular: no malignant arrhythmias Respiratory: Bilaterally diminished Skin: no rash seen on limited exam Musculoskeletal: No gross abnormality Psychiatric:unable to assess Neurologic:no involuntary movements         Lab Data:   Basic Metabolic Panel: Recent Labs  Lab 05/05/22 1105 05/06/22 0620 05/08/22 0518  NA 150* 145 141  K 4.3 4.6 3.8  CL 110 108 102  CO2 30 30 33*  GLUCOSE 90 138* 86  BUN 29* 21 36*  CREATININE 0.59 0.56 0.73  CALCIUM 10.3 9.8 9.9  MG  --   --  1.8    ABG: No results for input(s): "PHART", "PCO2ART", "PO2ART", "HCO3", "O2SAT" in the last 168 hours.  Liver Function Tests: Recent Labs  Lab 05/05/22 1105 05/08/22 0518  AST 34 18  ALT 88* 42  ALKPHOS 76 74  BILITOT 0.7 0.5  PROT 6.8 6.4*  ALBUMIN 3.4* 3.0*   No results for input(s): "LIPASE", "AMYLASE" in the last 168 hours. No results for input(s): "AMMONIA" in the last 168 hours.  CBC: Recent Labs  Lab 05/08/22 0518  WBC 9.5  HGB 8.5*  HCT 26.2*  MCV 84.5  PLT 196    Cardiac Enzymes: No results for input(s): "CKTOTAL", "CKMB", "CKMBINDEX",  "TROPONINI" in the last 168 hours.  BNP (last 3 results) Recent Labs    04/08/22 0336  BNP 51.6    ProBNP (last 3 results) No results for input(s): "PROBNP" in the last 8760 hours.  Radiological Exams: DG Chest Port 1 View  Result Date: 05/10/2022 CLINICAL DATA:  Respiratory failure EXAM: PORTABLE CHEST 1 VIEW COMPARISON:  05/03/2022 FINDINGS: Tracheostomy tube in place. Gastrostomy tube balloon projects over the left upper quadrant. Previously seen transesophageal enteric tube has been removed. Heart size within normal limits. Low lung volumes. Mild bibasilar atelectasis. No new airspace consolidation. No pleural effusion or pneumothorax. IMPRESSION: Low lung volumes with mild bibasilar atelectasis. No new airspace consolidation. Electronically Signed   By: Davina Poke D.O.   On: 05/10/2022 13:30    Assessment/Plan Active Problems:   Sickle cell trait (HCC)   Acute on chronic respiratory failure with hypoxia (HCC)   Pleural effusion, right   Tracheostomy status (HCC)   Healthcare-associated pneumonia   Chronic respiratory failure hypoxia-currently on full support, has a 2-hour ATC goal today.  Unfortunately she has been failing her ATC attempts due to severe anxiety.  Continue weaning per protocol.  Continue with supportive care. Sickle cell trait-overall no change.  Continue with medical management. Tracheostomy-remains in place and stable.  Continue with trach care per protocol. Healthcare associated pneumonia-has been treated.  Continue to monitor. Pleural effusion-last chest film on August 12 showed no current effusion.  Continue to monitor.   I have personally seen and evaluated the patient, evaluated laboratory and imaging results, formulated the assessment and plan and placed orders. The Patient requires high complexity decision making with multiple systems involvement.  Rounds were done with the Respiratory Therapy Director and Staff therapists and discussed with  nursing staff also.  Allyne Gee, MD [facility] Pulmonary Critical Care Medicine Sleep Medicine

## 2022-05-12 LAB — BASIC METABOLIC PANEL
Anion gap: 6 (ref 5–15)
BUN: 42 mg/dL — ABNORMAL HIGH (ref 8–23)
CO2: 32 mmol/L (ref 22–32)
Calcium: 9.7 mg/dL (ref 8.9–10.3)
Chloride: 105 mmol/L (ref 98–111)
Creatinine, Ser: 0.69 mg/dL (ref 0.44–1.00)
GFR, Estimated: >60 mL/min (ref >60)
Glucose, Bld: 122 mg/dL — ABNORMAL HIGH (ref 70–99)
Potassium: 4.9 mmol/L (ref 3.5–5.1)
Sodium: 143 mmol/L (ref 135–145)

## 2022-05-12 LAB — CBC
HCT: 27.9 % — ABNORMAL LOW (ref 36.0–46.0)
Hemoglobin: 8.8 g/dL — ABNORMAL LOW (ref 12.0–15.0)
MCH: 27.4 pg (ref 26.0–34.0)
MCHC: 31.5 g/dL (ref 30.0–36.0)
MCV: 86.9 fL (ref 80.0–100.0)
Platelets: 207 10*3/uL (ref 150–400)
RBC: 3.21 MIL/uL — ABNORMAL LOW (ref 3.87–5.11)
RDW: 16.1 % — ABNORMAL HIGH (ref 11.5–15.5)
WBC: 10 10*3/uL (ref 4.0–10.5)
nRBC: 0 % (ref 0.0–0.2)

## 2022-05-12 LAB — MAGNESIUM: Magnesium: 2.4 mg/dL (ref 1.7–2.4)

## 2022-05-12 NOTE — Telephone Encounter (Signed)
From patient.

## 2022-05-12 NOTE — Progress Notes (Cosign Needed)
Pulmonary [place]   PULMONARY CRITICAL CARE SERVICE  PROGRESS NOTE     Sheila Fernandez  [id number]  DOB: 12-Nov-1950   DOA: 04/08/2022  Referring Physician: Satira Sark, MD  HPI: Sheila Fernandez is a 71 y.o. female being followed for ventilator/airway/oxygen weaning Acute on Chronic Respiratory Failure.  Patient seen lying in bed, currently on full support.  Was successful completing 2-hour ATC goal yesterday, we will move forward with a 4-hour ATC goal and 12-hour pressure support goal today.  No acute distress, no acute overnight events.  Medications: Reviewed on Rounds  Physical Exam:  Vitals: Temp 96.2, pulse 64, respirations 19, BP 124/80, SPO2 100%  Ventilator Settings AC VC plus, FiO2 20%, tidal volume 400, rate 12, PEEP 5  General: Comfortable at this time Neck: supple Cardiovascular: no malignant arrhythmias Respiratory: Bilaterally diminished Skin: no rash seen on limited exam Musculoskeletal: No gross abnormality Psychiatric:unable to assess Neurologic:no involuntary movements         Lab Data:   Basic Metabolic Panel: Recent Labs  Lab 05/05/22 1105 05/06/22 0620 05/08/22 0518  NA 150* 145 141  K 4.3 4.6 3.8  CL 110 108 102  CO2 30 30 33*  GLUCOSE 90 138* 86  BUN 29* 21 36*  CREATININE 0.59 0.56 0.73  CALCIUM 10.3 9.8 9.9  MG  --   --  1.8    ABG: No results for input(s): "PHART", "PCO2ART", "PO2ART", "HCO3", "O2SAT" in the last 168 hours.  Liver Function Tests: Recent Labs  Lab 05/05/22 1105 05/08/22 0518  AST 34 18  ALT 88* 42  ALKPHOS 76 74  BILITOT 0.7 0.5  PROT 6.8 6.4*  ALBUMIN 3.4* 3.0*   No results for input(s): "LIPASE", "AMYLASE" in the last 168 hours. No results for input(s): "AMMONIA" in the last 168 hours.  CBC: Recent Labs  Lab 05/08/22 0518 05/12/22 0824  WBC 9.5 10.0  HGB 8.5* 8.8*  HCT 26.2* 27.9*  MCV 84.5 86.9  PLT 196 207    Cardiac  Enzymes: No results for input(s): "CKTOTAL", "CKMB", "CKMBINDEX", "TROPONINI" in the last 168 hours.  BNP (last 3 results) Recent Labs    04/08/22 0336  BNP 51.6    ProBNP (last 3 results) No results for input(s): "PROBNP" in the last 8760 hours.  Radiological Exams: DG Chest Port 1 View  Result Date: 05/10/2022 CLINICAL DATA:  Respiratory failure EXAM: PORTABLE CHEST 1 VIEW COMPARISON:  05/03/2022 FINDINGS: Tracheostomy tube in place. Gastrostomy tube balloon projects over the left upper quadrant. Previously seen transesophageal enteric tube has been removed. Heart size within normal limits. Low lung volumes. Mild bibasilar atelectasis. No new airspace consolidation. No pleural effusion or pneumothorax. IMPRESSION: Low lung volumes with mild bibasilar atelectasis. No new airspace consolidation. Electronically Signed   By: Davina Poke D.O.   On: 05/10/2022 13:30    Assessment/Plan Active Problems:   Sickle cell trait (HCC)   Acute on chronic respiratory failure with hypoxia (HCC)   Pleural effusion, right   Tracheostomy status (HCC)   Healthcare-associated pneumonia   Chronic respiratory failure hypoxia-currently on full support, has a 4-hour ATC goal today.  Successfully completed 2-hour ATC goal yesterday.  Continue weaning per protocol.  Continue with supportive care. Sickle cell trait-overall no change.  Continue with medical management. Tracheostomy-remains in place and stable.  Continue with trach care per protocol. Healthcare associated pneumonia-has been treated.  Continue to monitor. Pleural effusion-last chest film on August 12 showed no  current effusion.  Continue to monitor.  I have personally seen and evaluated the patient, evaluated laboratory and imaging results, formulated the assessment and plan and placed orders. The Patient requires high complexity decision making with multiple systems involvement.  Rounds were done with the Respiratory Therapy Director and  Staff therapists and discussed with nursing staff also.  Allyne Gee, MD [facility] Pulmonary Critical Care Medicine Sleep Medicine

## 2022-05-13 NOTE — Progress Notes (Addendum)
Pulmonary [place]   PULMONARY CRITICAL CARE SERVICE  PROGRESS NOTE     Sheila Fernandez  [id number]:[id number]  DOB: 03/13/1951   DOA: 04/08/2022  Referring Physician: Satira Sark, MD  HPI:  Sheila Fernandez is a 71 y.o. female being followed for ventilator/airway/oxygen weaning Acute on Chronic Respiratory Failure.  Patient seen lying in bed, currently on ATC.  Was successful completing 4-hour ATC goal yesterday.  Currently working on a 8-hour ATC goal and 8-hour pressure support goal today.  No acute distress, no acute overnight events.  Medications: Reviewed on Rounds  Physical Exam:  Vitals: Temp 97.6, pulse 63, respirations 18, BP 101/62, SPO2 100%  Ventilator Settings ATC 28%  General: Comfortable at this time Neck: supple Cardiovascular: no malignant arrhythmias Respiratory: Bilaterally coarse Skin: no rash seen on limited exam Musculoskeletal: No gross abnormality Psychiatric:unable to assess Neurologic:no involuntary movements         Lab Data:   Basic Metabolic Panel: Recent Labs  Lab 05/08/22 0518 05/12/22 0824  NA 141 143  K 3.8 4.9  CL 102 105  CO2 33* 32  GLUCOSE 86 122*  BUN 36* 42*  CREATININE 0.73 0.69  CALCIUM 9.9 9.7  MG 1.8 2.4    ABG: No results for input(s): "PHART", "PCO2ART", "PO2ART", "HCO3", "O2SAT" in the last 168 hours.  Liver Function Tests: Recent Labs  Lab 05/08/22 0518  AST 18  ALT 42  ALKPHOS 74  BILITOT 0.5  PROT 6.4*  ALBUMIN 3.0*   No results for input(s): "LIPASE", "AMYLASE" in the last 168 hours. No results for input(s): "AMMONIA" in the last 168 hours.  CBC: Recent Labs  Lab 05/08/22 0518 05/12/22 0824  WBC 9.5 10.0  HGB 8.5* 8.8*  HCT 26.2* 27.9*  MCV 84.5 86.9  PLT 196 207    Cardiac Enzymes: No results for input(s): "CKTOTAL", "CKMB", "CKMBINDEX", "TROPONINI" in the last 168 hours.  BNP (last 3 results) Recent Labs    04/08/22 0336  BNP  51.6    ProBNP (last 3 results) No results for input(s): "PROBNP" in the last 8760 hours.  Radiological Exams: No results found.  Assessment/Plan Active Problems:   Sickle cell trait (HCC)   Acute on chronic respiratory failure with hypoxia (HCC)   Pleural effusion, right   Tracheostomy status (HCC)   Healthcare-associated pneumonia  Chronic respiratory failure hypoxia-currently on ATC.  We will be working towards an 8-hour ATC goal and 8-hour pressure support goal today.  Successfully completed 4-hour ATC goal yesterday.  Continue weaning per protocol.  Continue with supportive care. Sickle cell trait-overall no change.  Continue with medical management. Tracheostomy-remains in place and stable.  Continue with trach care per protocol. Healthcare associated pneumonia-has been treated.  Continue to monitor. Pleural effusion-last chest film on August 12 showed no current effusion.  Continue to monitor.   I have personally seen and evaluated the patient, evaluated laboratory and imaging results, formulated the assessment and plan and placed orders. The Patient requires high complexity decision making with multiple systems involvement.  Rounds were done with the Respiratory Therapy Director and Staff therapists and discussed with nursing staff also.  Allyne Gee, MD [facility] Pulmonary Critical Care Medicine Sleep Medicine

## 2022-05-14 NOTE — Progress Notes (Addendum)
Pulmonary [place]   PULMONARY CRITICAL CARE SERVICE  PROGRESS NOTE     Sheila Fernandez  MBW:[id number]  DOB: 10/20/1950   DOA: 04/08/2022  Referring Physician: Satira Sark, MD  HPI: Sheila Fernandez is a 71 y.o. female being followed for ventilator/airway/oxygen weaning Acute on Chronic Respiratory Failure.  Patient seen lying in bed, currently remains on ATC 40% with PMV in place.  Can go ahead and move forward with capping trials as tolerated today.  No acute distress  Medications: Reviewed on Rounds  Physical Exam:  Vitals: -  Ventilator Settings ATC 40% with PMV in place  General: Comfortable at this time Neck: supple Cardiovascular: no malignant arrhythmias Respiratory: Bilaterally coarse Skin: no rash seen on limited exam Musculoskeletal: No gross abnormality Psychiatric:unable to assess Neurologic:no involuntary movements         Lab Data:   Basic Metabolic Panel: Recent Labs  Lab 05/08/22 0518 05/12/22 0824  NA 141 143  K 3.8 4.9  CL 102 105  CO2 33* 32  GLUCOSE 86 122*  BUN 36* 42*  CREATININE 0.73 0.69  CALCIUM 9.9 9.7  MG 1.8 2.4    ABG: No results for input(s): "PHART", "PCO2ART", "PO2ART", "HCO3", "O2SAT" in the last 168 hours.  Liver Function Tests: Recent Labs  Lab 05/08/22 0518  AST 18  ALT 42  ALKPHOS 74  BILITOT 0.5  PROT 6.4*  ALBUMIN 3.0*   No results for input(s): "LIPASE", "AMYLASE" in the last 168 hours. No results for input(s): "AMMONIA" in the last 168 hours.  CBC: Recent Labs  Lab 05/08/22 0518 05/12/22 0824  WBC 9.5 10.0  HGB 8.5* 8.8*  HCT 26.2* 27.9*  MCV 84.5 86.9  PLT 196 207    Cardiac Enzymes: No results for input(s): "CKTOTAL", "CKMB", "CKMBINDEX", "TROPONINI" in the last 168 hours.  BNP (last 3 results) Recent Labs    04/08/22 0336  BNP 51.6    ProBNP (last 3 results) No results for input(s): "PROBNP" in the last 8760  hours.  Radiological Exams: No results found.  Assessment/Plan Active Problems:   Sickle cell trait (HCC)   Acute on chronic respiratory failure with hypoxia (HCC)   Pleural effusion, right   Tracheostomy status (HCC)   Healthcare-associated pneumonia   Chronic respiratory failure hypoxia-currently remains on ATC 40% PMV in place, and tolerating well.  We will go ahead and start capping trials as tolerated today.  Continue with supportive care. Sickle cell trait-overall no change.  Continue with medical management. Tracheostomy-remains in place and stable.  Continue with trach care per protocol. Healthcare associated pneumonia-has been treated.  Continue to monitor. Pleural effusion-last chest film on August 12 showed no current effusion.  Continue to monitor.   I have personally seen and evaluated the patient, evaluated laboratory and imaging results, formulated the assessment and plan and placed orders. The Patient requires high complexity decision making with multiple systems involvement.  Rounds were done with the Respiratory Therapy Director and Staff therapists and discussed with nursing staff also.  Allyne Gee, MD [facility] Pulmonary Critical Care Medicine Sleep Medicine

## 2022-05-15 LAB — CBC
HCT: 25 % — ABNORMAL LOW (ref 36.0–46.0)
Hemoglobin: 7.9 g/dL — ABNORMAL LOW (ref 12.0–15.0)
MCH: 27.9 pg (ref 26.0–34.0)
MCHC: 31.6 g/dL (ref 30.0–36.0)
MCV: 88.3 fL (ref 80.0–100.0)
Platelets: 203 10*3/uL (ref 150–400)
RBC: 2.83 MIL/uL — ABNORMAL LOW (ref 3.87–5.11)
RDW: 16.6 % — ABNORMAL HIGH (ref 11.5–15.5)
WBC: 9.6 10*3/uL (ref 4.0–10.5)
nRBC: 0 % (ref 0.0–0.2)

## 2022-05-15 LAB — BASIC METABOLIC PANEL
Anion gap: 5 (ref 5–15)
BUN: 32 mg/dL — ABNORMAL HIGH (ref 8–23)
CO2: 34 mmol/L — ABNORMAL HIGH (ref 22–32)
Calcium: 9.6 mg/dL (ref 8.9–10.3)
Chloride: 107 mmol/L (ref 98–111)
Creatinine, Ser: 0.8 mg/dL (ref 0.44–1.00)
GFR, Estimated: >60 mL/min (ref >60)
Glucose, Bld: 140 mg/dL — ABNORMAL HIGH (ref 70–99)
Potassium: 4.3 mmol/L (ref 3.5–5.1)
Sodium: 146 mmol/L — ABNORMAL HIGH (ref 135–145)

## 2022-05-15 LAB — MAGNESIUM: Magnesium: 2.2 mg/dL (ref 1.7–2.4)

## 2022-05-15 NOTE — Progress Notes (Signed)
Pulmonary [place]   PULMONARY CRITICAL CARE SERVICE  PROGRESS NOTE     Sheila Fernandez  ERX:[id number]  DOB: 1950-11-28   DOA: 04/08/2022  Referring Physician: Satira Sark, MD  HPI: Sheila Fernandez is a 71 y.o. female being followed for ventilator/airway/oxygen weaning Acute on Chronic Respiratory Failure.  Patient currently on the ventilator and full support has been on assist control mode currently is requiring 28% FiO2.  Supposed to be weaning on T collar for 8 hours today  Medications: Reviewed on Rounds  Physical Exam:  Vitals: Temperature 96.8 pulse 61 respiratory rate is 18 blood pressure 115/64 saturations 100%  Ventilator Settings assist-control FiO2 is 28% tidal volume 400 PEEP of 5  General: Comfortable at this time Neck: supple Cardiovascular: no malignant arrhythmias Respiratory: Coarse rhonchi expansion is equal Skin: no rash seen on limited exam Musculoskeletal: No gross abnormality Psychiatric:unable to assess Neurologic:no involuntary movements         Lab Data:   Basic Metabolic Panel: Recent Labs  Lab 05/12/22 0824 05/15/22 0530  NA 143 146*  K 4.9 4.3  CL 105 107  CO2 32 34*  GLUCOSE 122* 140*  BUN 42* 32*  CREATININE 0.69 0.80  CALCIUM 9.7 9.6  MG 2.4 2.2    ABG: No results for input(s): "PHART", "PCO2ART", "PO2ART", "HCO3", "O2SAT" in the last 168 hours.  Liver Function Tests: No results for input(s): "AST", "ALT", "ALKPHOS", "BILITOT", "PROT", "ALBUMIN" in the last 168 hours. No results for input(s): "LIPASE", "AMYLASE" in the last 168 hours. No results for input(s): "AMMONIA" in the last 168 hours.  CBC: Recent Labs  Lab 05/12/22 0824 05/15/22 0530  WBC 10.0 9.6  HGB 8.8* 7.9*  HCT 27.9* 25.0*  MCV 86.9 88.3  PLT 207 203    Cardiac Enzymes: No results for input(s): "CKTOTAL", "CKMB", "CKMBINDEX", "TROPONINI" in the last 168 hours.  BNP (last 3 results) Recent  Labs    04/08/22 0336  BNP 51.6    ProBNP (last 3 results) No results for input(s): "PROBNP" in the last 8760 hours.  Radiological Exams: No results found.  Assessment/Plan Active Problems:   Sickle cell trait (HCC)   Acute on chronic respiratory failure with hypoxia (HCC)   Pleural effusion, right   Tracheostomy status (HCC)   Healthcare-associated pneumonia   Acute on chronic respiratory failure hypoxia plan is to continue with weaning goal of 8 hours off the ventilator Sickle cell trait no change we will continue to monitor Pleural effusion at baseline we will continue with supportive care Healthcare associated pneumonia has been treated we will continue to follow Tracheostomy remains in place   I have personally seen and evaluated the patient, evaluated laboratory and imaging results, formulated the assessment and plan and placed orders. The Patient requires high complexity decision making with multiple systems involvement.  Rounds were done with the Respiratory Therapy Director and Staff therapists and discussed with nursing staff also.  Allyne Gee, MD [facility] Pulmonary Critical Care Medicine Sleep Medicine

## 2022-05-16 LAB — BASIC METABOLIC PANEL
Anion gap: 2 — ABNORMAL LOW (ref 5–15)
BUN: 28 mg/dL — ABNORMAL HIGH (ref 8–23)
CO2: 32 mmol/L (ref 22–32)
Calcium: 9.5 mg/dL (ref 8.9–10.3)
Chloride: 109 mmol/L (ref 98–111)
Creatinine, Ser: 0.68 mg/dL (ref 0.44–1.00)
GFR, Estimated: >60 mL/min (ref >60)
Glucose, Bld: 98 mg/dL (ref 70–99)
Potassium: 4.1 mmol/L (ref 3.5–5.1)
Sodium: 143 mmol/L (ref 135–145)

## 2022-05-16 NOTE — Progress Notes (Signed)
Pulmonary [place]   PULMONARY CRITICAL CARE SERVICE  PROGRESS NOTE     [name]  UXN:[id number]  DOB: 07-Oct-1950   DOA: 04/08/2022  Referring Physician: Satira Sark, MD  HPI: Sheila Fernandez is a 71 y.o. female being followed for ventilator/airway/oxygen weaning Acute on Chronic Respiratory Failure. ***  Medications: Reviewed on Rounds  Physical Exam:  Vitals: ***  Ventilator Settings ***  General: Comfortable at this time Neck: supple Cardiovascular: no malignant arrhythmias Respiratory: *** Skin: no rash seen on limited exam Musculoskeletal: No gross abnormality Psychiatric:unable to assess Neurologic:no involuntary movements         Lab Data:   Basic Metabolic Panel: Recent Labs  Lab 05/12/22 0824 05/15/22 0530 05/16/22 0602  NA 143 146* 143  K 4.9 4.3 4.1  CL 105 107 109  CO2 32 34* 32  GLUCOSE 122* 140* 98  BUN 42* 32* 28*  CREATININE 0.69 0.80 0.68  CALCIUM 9.7 9.6 9.5  MG 2.4 2.2  --     ABG: No results for input(s): "PHART", "PCO2ART", "PO2ART", "HCO3", "O2SAT" in the last 168 hours.  Liver Function Tests: No results for input(s): "AST", "ALT", "ALKPHOS", "BILITOT", "PROT", "ALBUMIN" in the last 168 hours. No results for input(s): "LIPASE", "AMYLASE" in the last 168 hours. No results for input(s): "AMMONIA" in the last 168 hours.  CBC: Recent Labs  Lab 05/12/22 0824 05/15/22 0530  WBC 10.0 9.6  HGB 8.8* 7.9*  HCT 27.9* 25.0*  MCV 86.9 88.3  PLT 207 203    Cardiac Enzymes: No results for input(s): "CKTOTAL", "CKMB", "CKMBINDEX", "TROPONINI" in the last 168 hours.  BNP (last 3 results) Recent Labs    04/08/22 0336  BNP 51.6    ProBNP (last 3 results) No results for input(s): "PROBNP" in the last 8760 hours.  Radiological Exams: No results found.  Assessment/Plan Active Problems:   Sickle cell trait (HCC)   Acute on chronic respiratory failure with hypoxia  (HCC)   Pleural effusion, right   Tracheostomy status (HCC)   Healthcare-associated pneumonia   *** *** *** *** ***   I have personally seen and evaluated the patient, evaluated laboratory and imaging results, formulated the assessment and plan and placed orders. The Patient requires high complexity decision making with multiple systems involvement.  Rounds were done with the Respiratory Therapy Director and Staff therapists and discussed with nursing staff also.  Allyne Gee, MD [facility] Pulmonary Critical Care Medicine Sleep Medicine

## 2022-05-18 LAB — CBC
HCT: 28.4 % — ABNORMAL LOW (ref 36.0–46.0)
Hemoglobin: 8.7 g/dL — ABNORMAL LOW (ref 12.0–15.0)
MCH: 27.4 pg (ref 26.0–34.0)
MCHC: 30.6 g/dL (ref 30.0–36.0)
MCV: 89.3 fL (ref 80.0–100.0)
Platelets: 220 10*3/uL (ref 150–400)
RBC: 3.18 MIL/uL — ABNORMAL LOW (ref 3.87–5.11)
RDW: 17.1 % — ABNORMAL HIGH (ref 11.5–15.5)
WBC: 9.7 10*3/uL (ref 4.0–10.5)
nRBC: 0 % (ref 0.0–0.2)

## 2022-05-18 LAB — BASIC METABOLIC PANEL
Anion gap: 6 (ref 5–15)
BUN: 46 mg/dL — ABNORMAL HIGH (ref 8–23)
CO2: 30 mmol/L (ref 22–32)
Calcium: 9.6 mg/dL (ref 8.9–10.3)
Chloride: 105 mmol/L (ref 98–111)
Creatinine, Ser: 0.85 mg/dL (ref 0.44–1.00)
GFR, Estimated: >60 mL/min (ref >60)
Glucose, Bld: 101 mg/dL — ABNORMAL HIGH (ref 70–99)
Potassium: 4.5 mmol/L (ref 3.5–5.1)
Sodium: 141 mmol/L (ref 135–145)

## 2022-05-18 NOTE — Progress Notes (Addendum)
Pulmonary [place]   PULMONARY CRITICAL CARE SERVICE  PROGRESS NOTE     Sheila Fernandez  YHC:[id number]  DOB: [birth year]   DOA: 04/08/2022  Referring Physician: Satira Sark, MD  HPI: [name] is a 71 y.o. female being followed for ventilator/airway/oxygen weaning Acute on Chronic Respiratory Failure.  Patient seen sitting up in bed, currently on T-bar 28%.  Has a reattempt of 16-hour goal T-bar today.  Yesterday she did have a 16-hour goal but for unknown reason was placed back on pressure support prior to and time.  Does have a small amount of secretions that remain present.  Patient appears to still have a lot of anxiety.  Medications: Reviewed on Rounds  Physical Exam:  Vitals: Temp 98.1, pulse 68, respirations 30, BP 101/58, SPO2 100%  Ventilator Settings T-bar 28%  General: Comfortable at this time Neck: supple Cardiovascular: no malignant arrhythmias Respiratory: Bilaterally coarse Skin: no rash seen on limited exam Musculoskeletal: No gross abnormality Psychiatric:unable to assess Neurologic:no involuntary movements         Lab Data:   Basic Metabolic Panel: Recent Labs  Lab 05/12/22 0824 05/15/22 0530 05/16/22 0602  NA 143 146* 143  K 4.9 4.3 4.1  CL 105 107 109  CO2 32 34* 32  GLUCOSE 122* 140* 98  BUN 42* 32* 28*  CREATININE 0.69 0.80 0.68  CALCIUM 9.7 9.6 9.5  MG 2.4 2.2  --     ABG: No results for input(s): "PHART", "PCO2ART", "PO2ART", "HCO3", "O2SAT" in the last 168 hours.  Liver Function Tests: No results for input(s): "AST", "ALT", "ALKPHOS", "BILITOT", "PROT", "ALBUMIN" in the last 168 hours. No results for input(s): "LIPASE", "AMYLASE" in the last 168 hours. No results for input(s): "AMMONIA" in the last 168 hours.  CBC: Recent Labs  Lab 05/12/22 0824 05/15/22 0530  WBC 10.0 9.6  HGB 8.8* 7.9*  HCT 27.9* 25.0*  MCV 86.9 88.3  PLT 207 203    Cardiac Enzymes: No  results for input(s): "CKTOTAL", "CKMB", "CKMBINDEX", "TROPONINI" in the last 168 hours.  BNP (last 3 results) Recent Labs    04/08/22 0336  BNP 51.6    ProBNP (last 3 results) No results for input(s): "PROBNP" in the last 8760 hours.  Radiological Exams: No results found.  Assessment/Plan Active Problems:   Sickle cell trait (HCC)   Acute on chronic respiratory failure with hypoxia (HCC)   Pleural effusion, right   Tracheostomy status (HCC)   Healthcare-associated pneumonia   Chronic respiratory failure hypoxia-currently remains on ATC 28% with a 16-hour goal.  Continue with supportive care. Sickle cell trait-overall no change.  Continue with medical management. Tracheostomy-remains in place and stable.  Continue with trach care per protocol. Healthcare associated pneumonia-has been treated.  Continue to monitor. Pleural effusion-last chest film on August 12 showed no current effusion.  Continue to monitor.   I have personally seen and evaluated the patient, evaluated laboratory and imaging results, formulated the assessment and plan and placed orders. The Patient requires high complexity decision making with multiple systems involvement.  Rounds were done with the Respiratory Therapy Director and Staff therapists and discussed with nursing staff also.  Allyne Gee, MD [facility] Pulmonary Critical Care Medicine Sleep Medicine

## 2022-05-18 NOTE — Progress Notes (Signed)
Pulmonary [place]   PULMONARY CRITICAL CARE SERVICE  PROGRESS NOTE     DAYSY SANTINI  QMG:[id number]  DOB: May 20, 1951   DOA: 04/08/2022  Referring Physician: Satira Sark, MD  HPI: Katlyne JASA DUNDON is a 71 y.o. female being followed for ventilator/airway/oxygen weaning Acute on Chronic Respiratory Failure.  Patient is off the ventilator supposed to be completing 24 hours of T collar  Medications: Reviewed on Rounds  Physical Exam:  Vitals: Temperature is 96.3 pulse 72 respiratory 23 blood pressure 136/84 saturations 98%  Ventilator Settings on T collar  General: Comfortable at this time Neck: supple Cardiovascular: no malignant arrhythmias Respiratory: No rhonchi no rales are noted Skin: no rash seen on limited exam Musculoskeletal: No gross abnormality Psychiatric:unable to assess Neurologic:no involuntary movements         Lab Data:   Basic Metabolic Panel: Recent Labs  Lab 05/12/22 0824 05/15/22 0530 05/16/22 0602 05/18/22 0553  NA 143 146* 143 141  K 4.9 4.3 4.1 4.5  CL 105 107 109 105  CO2 32 34* 32 30  GLUCOSE 122* 140* 98 101*  BUN 42* 32* 28* 46*  CREATININE 0.69 0.80 0.68 0.85  CALCIUM 9.7 9.6 9.5 9.6  MG 2.4 2.2  --   --     ABG: No results for input(s): "PHART", "PCO2ART", "PO2ART", "HCO3", "O2SAT" in the last 168 hours.  Liver Function Tests: No results for input(s): "AST", "ALT", "ALKPHOS", "BILITOT", "PROT", "ALBUMIN" in the last 168 hours. No results for input(s): "LIPASE", "AMYLASE" in the last 168 hours. No results for input(s): "AMMONIA" in the last 168 hours.  CBC: Recent Labs  Lab 05/12/22 0824 05/15/22 0530 05/18/22 0553  WBC 10.0 9.6 9.7  HGB 8.8* 7.9* 8.7*  HCT 27.9* 25.0* 28.4*  MCV 86.9 88.3 89.3  PLT 207 203 220    Cardiac Enzymes: No results for input(s): "CKTOTAL", "CKMB", "CKMBINDEX", "TROPONINI" in the last 168 hours.  BNP (last 3 results) Recent Labs     04/08/22 0336  BNP 51.6    ProBNP (last 3 results) No results for input(s): "PROBNP" in the last 8760 hours.  Radiological Exams: No results found.  Assessment/Plan Active Problems:   Sickle cell trait (HCC)   Acute on chronic respiratory failure with hypoxia (HCC)   Pleural effusion, right   Tracheostomy status (HCC)   Healthcare-associated pneumonia   Acute on chronic respiratory failure hypoxia we will continue with T-piece continue secretion management pulmonary toilet Sickle cell trait no change Tracheostomy remains in place for now Healthcare associated pneumonia has been treated Pleural effusion following on x-rays   I have personally seen and evaluated the patient, evaluated laboratory and imaging results, formulated the assessment and plan and placed orders. The Patient requires high complexity decision making with multiple systems involvement.  Rounds were done with the Respiratory Therapy Director and Staff therapists and discussed with nursing staff also.  Allyne Gee, MD [facility] Pulmonary Critical Care Medicine Sleep Medicine

## 2022-05-19 LAB — MULTIPLE MYELOMA PANEL, SERUM
Albumin SerPl Elph-Mcnc: 3.4 g/dL (ref 2.9–4.4)
Albumin/Glob SerPl: 1.1 (ref 0.7–1.7)
Alpha 1: 0.2 g/dL (ref 0.0–0.4)
Alpha2 Glob SerPl Elph-Mcnc: 0.7 g/dL (ref 0.4–1.0)
B-Globulin SerPl Elph-Mcnc: 0.8 g/dL (ref 0.7–1.3)
Gamma Glob SerPl Elph-Mcnc: 1.3 g/dL (ref 0.4–1.8)
Globulin, Total: 3.1 g/dL (ref 2.2–3.9)
IgA: 286 mg/dL (ref 64–422)
IgG (Immunoglobin G), Serum: 1407 mg/dL (ref 586–1602)
IgM (Immunoglobulin M), Srm: 76 mg/dL (ref 26–217)
Total Protein ELP: 6.5 g/dL (ref 6.0–8.5)

## 2022-05-19 NOTE — Progress Notes (Signed)
Pulmonary [place]   PULMONARY CRITICAL CARE SERVICE  PROGRESS NOTE     Sheila Fernandez  [id number]:[id number]  DOB: 11-26-1950   DOA: 04/08/2022  Referring Physician: Satira Sark, MD  HPI: Sheila Fernandez is a 71 y.o. female being followed for ventilator/airway/oxygen weaning Acute on Chronic Respiratory Failure.  Patient is on T collar currently goal is for 24 hours  Medications: Reviewed on Rounds  Physical Exam:  Vitals: Temperature is 97.1 pulse 64 respiratory rate is 19 blood pressure is 121/76 saturations 100%  Ventilator Settings patient is on T collar goal of 24 hours  General: Comfortable at this time Neck: supple Cardiovascular: no malignant arrhythmias Respiratory: Scattered rhonchi expansion is equal Skin: no rash seen on limited exam Musculoskeletal: No gross abnormality Psychiatric:unable to assess Neurologic:no involuntary movements         Lab Data:   Basic Metabolic Panel: Recent Labs  Lab 05/15/22 0530 05/16/22 0602 05/18/22 0553  NA 146* 143 141  K 4.3 4.1 4.5  CL 107 109 105  CO2 34* 32 30  GLUCOSE 140* 98 101*  BUN 32* 28* 46*  CREATININE 0.80 0.68 0.85  CALCIUM 9.6 9.5 9.6  MG 2.2  --   --     ABG: No results for input(s): "PHART", "PCO2ART", "PO2ART", "HCO3", "O2SAT" in the last 168 hours.  Liver Function Tests: No results for input(s): "AST", "ALT", "ALKPHOS", "BILITOT", "PROT", "ALBUMIN" in the last 168 hours. No results for input(s): "LIPASE", "AMYLASE" in the last 168 hours. No results for input(s): "AMMONIA" in the last 168 hours.  CBC: Recent Labs  Lab 05/15/22 0530 05/18/22 0553  WBC 9.6 9.7  HGB 7.9* 8.7*  HCT 25.0* 28.4*  MCV 88.3 89.3  PLT 203 220    Cardiac Enzymes: No results for input(s): "CKTOTAL", "CKMB", "CKMBINDEX", "TROPONINI" in the last 168 hours.  BNP (last 3 results) Recent Labs    04/08/22 0336  BNP 51.6    ProBNP (last 3 results) No  results for input(s): "PROBNP" in the last 8760 hours.  Radiological Exams: No results found.  Assessment/Plan Active Problems:   Sickle cell trait (HCC)   Acute on chronic respiratory failure with hypoxia (HCC)   Pleural effusion, right   Tracheostomy status (HCC)   Healthcare-associated pneumonia   Acute on chronic respiratory failure hypoxia we will continue with T collar trials Sickle cell trait no change we will continue to follow along Pleural effusion supportive care Tracheostomy remains in place Healthcare associated pneumonia has been treated we will continue to follow along   I have personally seen and evaluated the patient, evaluated laboratory and imaging results, formulated the assessment and plan and placed orders. The Patient requires high complexity decision making with multiple systems involvement.  Rounds were done with the Respiratory Therapy Director and Staff therapists and discussed with nursing staff also.  Allyne Gee, MD [facility] Pulmonary Critical Care Medicine Sleep Medicine

## 2022-05-20 NOTE — Progress Notes (Signed)
Pulmonary [place]   PULMONARY CRITICAL CARE SERVICE  PROGRESS NOTE     Sheila Fernandez  [id number]:[id number]  DOB: 09-05-1951   DOA: 04/08/2022  Referring Physician: Satira Sark, MD  HPI: Sheila Fernandez is a 71 y.o. female being followed for ventilator/airway/oxygen weaning Acute on Chronic Respiratory Failure.  Patient currently is on T collar has been on 28% FiO2  Medications: Reviewed on Rounds  Physical Exam:  Vitals: Temperature is 97.9 pulse of 77 respiratory 22 blood pressure 124/79 saturations 100%  Ventilator Settings on T collar FiO2 is 28%  General: Comfortable at this time Neck: supple Cardiovascular: no malignant arrhythmias Respiratory: No rhonchi no rales are noted at this time Skin: no rash seen on limited exam Musculoskeletal: No gross abnormality Psychiatric:unable to assess Neurologic:no involuntary movements         Lab Data:   Basic Metabolic Panel: Recent Labs  Lab 05/15/22 0530 05/16/22 0602 05/18/22 0553  NA 146* 143 141  K 4.3 4.1 4.5  CL 107 109 105  CO2 34* 32 30  GLUCOSE 140* 98 101*  BUN 32* 28* 46*  CREATININE 0.80 0.68 0.85  CALCIUM 9.6 9.5 9.6  MG 2.2  --   --     ABG: No results for input(s): "PHART", "PCO2ART", "PO2ART", "HCO3", "O2SAT" in the last 168 hours.  Liver Function Tests: No results for input(s): "AST", "ALT", "ALKPHOS", "BILITOT", "PROT", "ALBUMIN" in the last 168 hours. No results for input(s): "LIPASE", "AMYLASE" in the last 168 hours. No results for input(s): "AMMONIA" in the last 168 hours.  CBC: Recent Labs  Lab 05/15/22 0530 05/18/22 0553  WBC 9.6 9.7  HGB 7.9* 8.7*  HCT 25.0* 28.4*  MCV 88.3 89.3  PLT 203 220    Cardiac Enzymes: No results for input(s): "CKTOTAL", "CKMB", "CKMBINDEX", "TROPONINI" in the last 168 hours.  BNP (last 3 results) Recent Labs    04/08/22 0336  BNP 51.6    ProBNP (last 3 results) No results for input(s):  "PROBNP" in the last 8760 hours.  Radiological Exams: No results found.  Assessment/Plan Active Problems:   Sickle cell trait (HCC)   Acute on chronic respiratory failure with hypoxia (HCC)   Pleural effusion, right   Tracheostomy status (HCC)   Healthcare-associated pneumonia   Acute on chronic respiratory failure with hypoxia we will continue to wean on T collar now the goal will be 72 hours go ahead and change the trach out to a cuffless Sickle cell trait supportive care Tracheostomy will be changed out as indicated Healthcare associated pneumonia has been treated Pleural effusion following x-rays periodically   I have personally seen and evaluated the patient, evaluated laboratory and imaging results, formulated the assessment and plan and placed orders. The Patient requires high complexity decision making with multiple systems involvement.  Rounds were done with the Respiratory Therapy Director and Staff therapists and discussed with nursing staff also.  Allyne Gee, MD [facility] Pulmonary Critical Care Medicine Sleep Medicine

## 2022-05-21 LAB — BASIC METABOLIC PANEL
Anion gap: 8 (ref 5–15)
BUN: 38 mg/dL — ABNORMAL HIGH (ref 8–23)
CO2: 33 mmol/L — ABNORMAL HIGH (ref 22–32)
Calcium: 9.7 mg/dL (ref 8.9–10.3)
Chloride: 110 mmol/L (ref 98–111)
Creatinine, Ser: 0.73 mg/dL (ref 0.44–1.00)
GFR, Estimated: >60 mL/min (ref >60)
Glucose, Bld: 142 mg/dL — ABNORMAL HIGH (ref 70–99)
Potassium: 4.4 mmol/L (ref 3.5–5.1)
Sodium: 151 mmol/L — ABNORMAL HIGH (ref 135–145)

## 2022-05-21 NOTE — Progress Notes (Signed)
Pulmonary [place]   PULMONARY CRITICAL CARE SERVICE  PROGRESS NOTE     Sheila Fernandez  PNT:[id number]  DOB: [birth year]   DOA: 04/08/2022  Referring Physician: Satira Sark, MD  HPI: Sheila Fernandez is a 71 y.o. female being followed for ventilator/airway/oxygen weaning Acute on Chronic Respiratory Failure.  Patient is resting comfortably without distress patient has been on T collar has been 28% FiO2 completing 72 hours  Medications: Reviewed on Rounds  Physical Exam:  Vitals: Temperature is 97.3 pulse 76 respiratory rate 16 blood pressure is 133/82 saturations 98%  Ventilator Settings T collar FiO2 28%  General: Comfortable at this time Neck: supple Cardiovascular: no malignant arrhythmias Respiratory: No rhonchi no rales are noted at this time Skin: no rash seen on limited exam Musculoskeletal: No gross abnormality Psychiatric:unable to assess Neurologic:no involuntary movements         Lab Data:   Basic Metabolic Panel: Recent Labs  Lab 05/15/22 0530 05/16/22 0602 05/18/22 0553 05/21/22 0853  NA 146* 143 141 151*  K 4.3 4.1 4.5 4.4  CL 107 109 105 110  CO2 34* 32 30 33*  GLUCOSE 140* 98 101* 142*  BUN 32* 28* 46* 38*  CREATININE 0.80 0.68 0.85 0.73  CALCIUM 9.6 9.5 9.6 9.7  MG 2.2  --   --   --     ABG: No results for input(s): "PHART", "PCO2ART", "PO2ART", "HCO3", "O2SAT" in the last 168 hours.  Liver Function Tests: No results for input(s): "AST", "ALT", "ALKPHOS", "BILITOT", "PROT", "ALBUMIN" in the last 168 hours. No results for input(s): "LIPASE", "AMYLASE" in the last 168 hours. No results for input(s): "AMMONIA" in the last 168 hours.  CBC: Recent Labs  Lab 05/15/22 0530 05/18/22 0553  WBC 9.6 9.7  HGB 7.9* 8.7*  HCT 25.0* 28.4*  MCV 88.3 89.3  PLT 203 220    Cardiac Enzymes: No results for input(s): "CKTOTAL", "CKMB", "CKMBINDEX", "TROPONINI" in the last 168 hours.  BNP  (last 3 results) Recent Labs    04/08/22 0336  BNP 51.6    ProBNP (last 3 results) No results for input(s): "PROBNP" in the last 8760 hours.  Radiological Exams: No results found.  Assessment/Plan Active Problems:   Sickle cell trait (HCC)   Acute on chronic respiratory failure with hypoxia (HCC)   Pleural effusion, right   Tracheostomy status (HCC)   Healthcare-associated pneumonia   Acute on chronic respiratory failure hypoxia we will continue with T-piece patient is doing well now completing 72 hours Sickle cell trait not active we will continue to monitor closely Pleural effusion we will continue with supportive care Tracheostomy remains in place Healthcare associated pneumonia has been treated improving clinically   I have personally seen and evaluated the patient, evaluated laboratory and imaging results, formulated the assessment and plan and placed orders. The Patient requires high complexity decision making with multiple systems involvement.  Rounds were done with the Respiratory Therapy Director and Staff therapists and discussed with nursing staff also.  Allyne Gee, MD [facility] Pulmonary Critical Care Medicine Sleep Medicine

## 2022-05-22 LAB — SODIUM: Sodium: 149 mmol/L — ABNORMAL HIGH (ref 135–145)

## 2022-05-22 NOTE — Progress Notes (Addendum)
Pulmonary [place]   PULMONARY CRITICAL CARE SERVICE  PROGRESS NOTE     VELNA HEDGECOCK  OZD:[id number]  DOB: 05-Aug-1951   DOA: 04/08/2022  Referring Physician: Satira Sark, MD  HPI: Sheila Fernandez is a 71 y.o. female being followed for ventilator/airway/oxygen weaning Acute on Chronic Respiratory Failure.  Patient seen lying in bed, has now been off the vent for 96 hours.  Currently on ATC 28%.  She does continue to have a large amount of anxiety.  We are not planning for decannulation on this admission.  Medications: Reviewed on Rounds  Physical Exam:  Vitals: Temp 97.6, pulse 84, respirations 34, BP 100/91, SPO2 98%  Ventilator Settings ATC 28%  General: Comfortable at this time Neck: supple Cardiovascular: no malignant arrhythmias Respiratory: Bilaterally diminished Skin: no rash seen on limited exam Musculoskeletal: No gross abnormality Psychiatric:unable to assess Neurologic:no involuntary movements         Lab Data:   Basic Metabolic Panel: Recent Labs  Lab 05/16/22 0602 05/18/22 0553 05/21/22 0853 05/22/22 0618  NA 143 141 151* 149*  K 4.1 4.5 4.4  --   CL 109 105 110  --   CO2 32 30 33*  --   GLUCOSE 98 101* 142*  --   BUN 28* 46* 38*  --   CREATININE 0.68 0.85 0.73  --   CALCIUM 9.5 9.6 9.7  --     ABG: No results for input(s): "PHART", "PCO2ART", "PO2ART", "HCO3", "O2SAT" in the last 168 hours.  Liver Function Tests: No results for input(s): "AST", "ALT", "ALKPHOS", "BILITOT", "PROT", "ALBUMIN" in the last 168 hours. No results for input(s): "LIPASE", "AMYLASE" in the last 168 hours. No results for input(s): "AMMONIA" in the last 168 hours.  CBC: Recent Labs  Lab 05/18/22 0553  WBC 9.7  HGB 8.7*  HCT 28.4*  MCV 89.3  PLT 220    Cardiac Enzymes: No results for input(s): "CKTOTAL", "CKMB", "CKMBINDEX", "TROPONINI" in the last 168 hours.  BNP (last 3 results) Recent Labs     04/08/22 0336  BNP 51.6    ProBNP (last 3 results) No results for input(s): "PROBNP" in the last 8760 hours.  Radiological Exams: No results found.  Assessment/Plan Active Problems:   Sickle cell trait (HCC)   Acute on chronic respiratory failure with hypoxia (HCC)   Pleural effusion, right   Tracheostomy status (HCC)   Healthcare-associated pneumonia   Acute on chronic respiratory failure hypoxia-has now been off the ventilator and on ATC for 96 hours.  Doing well on 28%.  Continue with supportive care. Sickle cell trait -not active we will continue to monitor closely/ Pleural effusion -last chest film free of pleural effusion.   Tracheostomy-remains in place.  No plan for decannulation this admission.  Continue with trach care per protocol. Healthcare associated pneumonia -has been treated and is improving clinically.    I have personally seen and evaluated the patient, evaluated laboratory and imaging results, formulated the assessment and plan and placed orders. The Patient requires high complexity decision making with multiple systems involvement.  Rounds were done with the Respiratory Therapy Director and Staff therapists and discussed with nursing staff also.  Allyne Gee, MD [facility] Pulmonary Critical Care Medicine Sleep Medicine

## 2022-05-23 ENCOUNTER — Other Ambulatory Visit (HOSPITAL_COMMUNITY): Payer: Medicare Other

## 2022-05-23 LAB — CK TOTAL AND CKMB (NOT AT ARMC)
CK, MB: 1.6 ng/mL (ref 0.5–5.0)
CK, MB: 1.4 ng/mL (ref 0.5–5.0)
Relative Index: INVALID (ref 0.0–2.5)
Relative Index: INVALID (ref 0.0–2.5)
Total CK: 19 U/L — ABNORMAL LOW (ref 38–234)
Total CK: 16 U/L — ABNORMAL LOW (ref 38–234)

## 2022-05-23 LAB — BASIC METABOLIC PANEL
Anion gap: 6 (ref 5–15)
BUN: 49 mg/dL — ABNORMAL HIGH (ref 8–23)
CO2: 33 mmol/L — ABNORMAL HIGH (ref 22–32)
Calcium: 9.4 mg/dL (ref 8.9–10.3)
Chloride: 106 mmol/L (ref 98–111)
Creatinine, Ser: 0.83 mg/dL (ref 0.44–1.00)
GFR, Estimated: >60 mL/min (ref >60)
Glucose, Bld: 245 mg/dL — ABNORMAL HIGH (ref 70–99)
Potassium: 4.6 mmol/L (ref 3.5–5.1)
Sodium: 145 mmol/L (ref 135–145)

## 2022-05-23 LAB — LACTIC ACID, PLASMA: Lactic Acid, Venous: 1.7 mmol/L (ref 0.5–1.9)

## 2022-05-23 LAB — MAGNESIUM: Magnesium: 2.1 mg/dL (ref 1.7–2.4)

## 2022-05-23 NOTE — Progress Notes (Cosign Needed)
Pulmonary [place]   PULMONARY CRITICAL CARE SERVICE  PROGRESS NOTE     Sheila Fernandez  LPF:[id number]  DOB: March 13, 1951   DOA: 04/08/2022  Referring Physician: Satira Sark, MD  HPI: Sheila Fernandez is a 71 y.o. female being followed for ventilator/airway/oxygen weaning Acute on Chronic Respiratory Failure.  Patient seen lying in bed, previously had been on 4 days of ATC 20% however at this moment there has been a rapid response called due to bradycardia and desaturation.  She is placed back on full support at this time.  Her anxiety is noted to be severe and remains present.  Medications: Reviewed on Rounds  Physical Exam:  Vitals: Temp 97.9, pulse 67, respirations 15, BP 173/74, SPO2 100%  Ventilator Settings AC VC plus, FiO2 100%, tidal volume 400, rate 12, PEEP 5  General: Comfortable at this time Neck: supple Cardiovascular: no malignant arrhythmias Respiratory: Bilaterally diminished Skin: no rash seen on limited exam Musculoskeletal: No gross abnormality Psychiatric:unable to assess Neurologic:no involuntary movements         Lab Data:   Basic Metabolic Panel: Recent Labs  Lab 05/18/22 0553 05/21/22 0853 05/22/22 0618 05/23/22 1145  NA 141 151* 149* 145  K 4.5 4.4  --  4.6  CL 105 110  --  106  CO2 30 33*  --  33*  GLUCOSE 101* 142*  --  245*  BUN 46* 38*  --  49*  CREATININE 0.85 0.73  --  0.83  CALCIUM 9.6 9.7  --  9.4  MG  --   --   --  2.1    ABG: No results for input(s): "PHART", "PCO2ART", "PO2ART", "HCO3", "O2SAT" in the last 168 hours.  Liver Function Tests: No results for input(s): "AST", "ALT", "ALKPHOS", "BILITOT", "PROT", "ALBUMIN" in the last 168 hours. No results for input(s): "LIPASE", "AMYLASE" in the last 168 hours. No results for input(s): "AMMONIA" in the last 168 hours.  CBC: Recent Labs  Lab 05/18/22 0553  WBC 9.7  HGB 8.7*  HCT 28.4*  MCV 89.3  PLT 220     Cardiac Enzymes: Recent Labs  Lab 05/23/22 1145  CKTOTAL 19*  CKMB 1.6    BNP (last 3 results) Recent Labs    04/08/22 0336  BNP 51.6    ProBNP (last 3 results) No results for input(s): "PROBNP" in the last 8760 hours.  Radiological Exams: DG Chest Port 1 View  Result Date: 05/23/2022 CLINICAL DATA:  Bradycardia EXAM: PORTABLE CHEST 1 VIEW COMPARISON:  Previous studies including the examination of 05/10/2022 FINDINGS: Tip of tracheostomy is 5.9 cm above the carina. Increased density is seen in the medial lower lung fields with interval worsening. Lateral CP angles are indistinct. There is no pneumothorax. IMPRESSION: Infiltrates are seen in both lower lung fields suggesting atelectasis/pneumonia with interval worsening Electronically Signed   By: Elmer Picker M.D.   On: 05/23/2022 12:17    Assessment/Plan Active Problems:   Sickle cell trait (HCC)   Acute on chronic respiratory failure with hypoxia (HCC)   Pleural effusion, right   Tracheostomy status (HCC)   Healthcare-associated pneumonia  Acute on chronic respiratory failure hypoxia-prior to this morning patient had been on ATC 28% for 4 days.  Rapid response called today requiring patient to be placed back on full support .continue with supportive care. Sickle cell trait -not active we will continue to monitor closely/ Pleural effusion -last chest film free of pleural effusion.   Tracheostomy-remains in  place.  No plan for decannulation this admission.  Continue with trach care per protocol. Healthcare associated pneumonia -has been treated and is improving clinically.   I have personally seen and evaluated the patient, evaluated laboratory and imaging results, formulated the assessment and plan and placed orders. The Patient requires high complexity decision making with multiple systems involvement.  Rounds were done with the Respiratory Therapy Director and Staff therapists and discussed with nursing staff  also.  Allyne Gee, MD [facility] Pulmonary Critical Care Medicine Sleep Medicine

## 2022-05-24 ENCOUNTER — Encounter (HOSPITAL_COMMUNITY): Payer: Self-pay

## 2022-05-24 ENCOUNTER — Inpatient Hospital Stay (HOSPITAL_COMMUNITY)
Admission: AD | Admit: 2022-05-24 | Discharge: 2022-05-26 | DRG: 308 | Disposition: A | Discharge status: Other Acute Inpatient Hospital | Payer: Medicare Other | Source: Other Acute Inpatient Hospital | Attending: Pulmonary Disease | Admitting: Pulmonary Disease

## 2022-05-24 ENCOUNTER — Other Ambulatory Visit (HOSPITAL_COMMUNITY): Payer: Medicare Other

## 2022-05-24 DIAGNOSIS — R001 Bradycardia, unspecified: Principal | ICD-10-CM

## 2022-05-24 DIAGNOSIS — Z803 Family history of malignant neoplasm of breast: Secondary | ICD-10-CM | POA: Diagnosis not present

## 2022-05-24 DIAGNOSIS — Z87891 Personal history of nicotine dependence: Secondary | ICD-10-CM

## 2022-05-24 DIAGNOSIS — Z8249 Family history of ischemic heart disease and other diseases of the circulatory system: Secondary | ICD-10-CM

## 2022-05-24 DIAGNOSIS — R579 Shock, unspecified: Secondary | ICD-10-CM | POA: Diagnosis present

## 2022-05-24 DIAGNOSIS — Y95 Nosocomial condition: Secondary | ICD-10-CM | POA: Diagnosis present

## 2022-05-24 DIAGNOSIS — M1990 Unspecified osteoarthritis, unspecified site: Secondary | ICD-10-CM | POA: Diagnosis present

## 2022-05-24 DIAGNOSIS — J189 Pneumonia, unspecified organism: Secondary | ICD-10-CM | POA: Diagnosis present

## 2022-05-24 DIAGNOSIS — F419 Anxiety disorder, unspecified: Secondary | ICD-10-CM | POA: Diagnosis present

## 2022-05-24 DIAGNOSIS — Z930 Tracheostomy status: Secondary | ICD-10-CM

## 2022-05-24 DIAGNOSIS — Z833 Family history of diabetes mellitus: Secondary | ICD-10-CM | POA: Diagnosis not present

## 2022-05-24 DIAGNOSIS — J9621 Acute and chronic respiratory failure with hypoxia: Secondary | ICD-10-CM | POA: Diagnosis present

## 2022-05-24 DIAGNOSIS — I10 Essential (primary) hypertension: Secondary | ICD-10-CM | POA: Diagnosis present

## 2022-05-24 DIAGNOSIS — G5600 Carpal tunnel syndrome, unspecified upper limb: Secondary | ICD-10-CM | POA: Diagnosis present

## 2022-05-24 DIAGNOSIS — D571 Sickle-cell disease without crisis: Secondary | ICD-10-CM | POA: Diagnosis present

## 2022-05-24 LAB — CBC
HCT: 26 % — ABNORMAL LOW (ref 36.0–46.0)
HCT: 26.4 % — ABNORMAL LOW (ref 36.0–46.0)
Hemoglobin: 8.1 g/dL — ABNORMAL LOW (ref 12.0–15.0)
Hemoglobin: 8.3 g/dL — ABNORMAL LOW (ref 12.0–15.0)
MCH: 28.1 pg (ref 26.0–34.0)
MCH: 28.3 pg (ref 26.0–34.0)
MCHC: 31.2 g/dL (ref 30.0–36.0)
MCHC: 31.4 g/dL (ref 30.0–36.0)
MCV: 90.3 fL (ref 80.0–100.0)
MCV: 90.1 fL (ref 80.0–100.0)
Platelets: 244 10*3/uL (ref 150–400)
Platelets: 230 10*3/uL (ref 150–400)
RBC: 2.88 MIL/uL — ABNORMAL LOW (ref 3.87–5.11)
RBC: 2.93 MIL/uL — ABNORMAL LOW (ref 3.87–5.11)
RDW: 17.4 % — ABNORMAL HIGH (ref 11.5–15.5)
RDW: 17.6 % — ABNORMAL HIGH (ref 11.5–15.5)
WBC: 13.4 10*3/uL — ABNORMAL HIGH (ref 4.0–10.5)
WBC: 9.8 10*3/uL (ref 4.0–10.5)
nRBC: 0 % (ref 0.0–0.2)
nRBC: 0 % (ref 0.0–0.2)

## 2022-05-24 LAB — MAGNESIUM: Magnesium: 2.1 mg/dL (ref 1.7–2.4)

## 2022-05-24 LAB — BASIC METABOLIC PANEL
Anion gap: 5 (ref 5–15)
Anion gap: 7 (ref 5–15)
BUN: 63 mg/dL — ABNORMAL HIGH (ref 8–23)
BUN: 39 mg/dL — ABNORMAL HIGH (ref 8–23)
CO2: 33 mmol/L — ABNORMAL HIGH (ref 22–32)
CO2: 31 mmol/L (ref 22–32)
Calcium: 9.4 mg/dL (ref 8.9–10.3)
Calcium: 9.2 mg/dL (ref 8.9–10.3)
Chloride: 106 mmol/L (ref 98–111)
Chloride: 107 mmol/L (ref 98–111)
Creatinine, Ser: 0.98 mg/dL (ref 0.44–1.00)
Creatinine, Ser: 0.68 mg/dL (ref 0.44–1.00)
GFR, Estimated: >60 mL/min (ref >60)
GFR, Estimated: >60 mL/min (ref >60)
Glucose, Bld: 159 mg/dL — ABNORMAL HIGH (ref 70–99)
Glucose, Bld: 221 mg/dL — ABNORMAL HIGH (ref 70–99)
Potassium: 4.5 mmol/L (ref 3.5–5.1)
Potassium: 4.4 mmol/L (ref 3.5–5.1)
Sodium: 144 mmol/L (ref 135–145)
Sodium: 145 mmol/L (ref 135–145)

## 2022-05-24 LAB — CK TOTAL AND CKMB (NOT AT ARMC)
CK, MB: 1 ng/mL (ref 0.5–5.0)
Relative Index: INVALID (ref 0.0–2.5)
Total CK: 19 U/L — ABNORMAL LOW (ref 38–234)

## 2022-05-24 LAB — MRSA NEXT GEN BY PCR, NASAL: MRSA by PCR Next Gen: NOT DETECTED

## 2022-05-24 LAB — PROCALCITONIN: Procalcitonin: 0.1 ng/mL

## 2022-05-24 MED ORDER — ACETAMINOPHEN 325 MG PO TABS
650.0000 mg | ORAL_TABLET | Freq: Four times a day (QID) | ORAL | Status: DC | PRN
  Administered 2022-05-25: 650 mg via ORAL
  Filled 2022-05-24: qty 2

## 2022-05-24 MED ORDER — THIAMINE HCL 100 MG PO TABS
100.0000 mg | ORAL_TABLET | Freq: Every day | ORAL | Status: DC
Start: 2022-05-25 — End: 2022-05-26
  Administered 2022-05-25: 100 mg via ORAL
  Filled 2022-05-24 (×3): qty 1

## 2022-05-24 MED ORDER — ENOXAPARIN SODIUM 40 MG/0.4ML IJ SOSY
40.0000 mg | PREFILLED_SYRINGE | INTRAMUSCULAR | Status: DC
  Administered 2022-05-25 – 2022-05-26 (×2): 40 mg via SUBCUTANEOUS
  Filled 2022-05-24 (×2): qty 0.4

## 2022-05-24 MED ORDER — MELATONIN 3 MG PO TABS
3.0000 mg | ORAL_TABLET | Freq: Every day | ORAL | Status: DC
  Administered 2022-05-24 – 2022-05-25 (×2): 3 mg via ORAL
  Filled 2022-05-24 (×2): qty 1

## 2022-05-24 MED ORDER — SODIUM CHLORIDE 0.9 % IV SOLN: 2.0000 g | Freq: Two times a day (BID) | INTRAVENOUS | Status: DC

## 2022-05-24 MED ORDER — PREDNISONE 20 MG PO TABS
20.0000 mg | ORAL_TABLET | Freq: Every day | ORAL | Status: DC
  Administered 2022-05-25 – 2022-05-26 (×2): 20 mg via ORAL
  Filled 2022-05-24 (×2): qty 1

## 2022-05-24 MED ORDER — PANTOPRAZOLE SODIUM 40 MG PO TBEC
40.0000 mg | DELAYED_RELEASE_TABLET | Freq: Every day | ORAL | Status: DC
Start: 2022-05-25 — End: 2022-05-26
  Administered 2022-05-25: 40 mg via ORAL
  Filled 2022-05-24: qty 1

## 2022-05-24 MED ORDER — SODIUM CHLORIDE 0.9 % IV SOLN
1.0000 g | Freq: Two times a day (BID) | INTRAVENOUS | Status: DC
  Administered 2022-05-24 – 2022-05-25 (×2): 1 g via INTRAVENOUS
  Filled 2022-05-24 (×3): qty 20

## 2022-05-24 MED ORDER — CLONAZEPAM 0.5 MG PO TABS
0.5000 mg | ORAL_TABLET | Freq: Four times a day (QID) | ORAL | Status: DC | PRN
  Administered 2022-05-25 – 2022-05-26 (×4): 0.5 mg via ORAL
  Filled 2022-05-24 (×4): qty 1

## 2022-05-24 MED ORDER — SODIUM CHLORIDE 3 % IN NEBU
4.0000 mL | INHALATION_SOLUTION | Freq: Two times a day (BID) | RESPIRATORY_TRACT | Status: DC
  Administered 2022-05-24 – 2022-05-26 (×4): 4 mL via RESPIRATORY_TRACT
  Filled 2022-05-24 (×4): qty 15

## 2022-05-24 MED ORDER — METRONIDAZOLE 500 MG/100ML IV SOLN: 500.0000 mg | Freq: Two times a day (BID) | INTRAVENOUS | Status: DC

## 2022-05-24 MED ORDER — DOCUSATE SODIUM 100 MG PO CAPS: 100.0000 mg | ORAL_CAPSULE | Freq: Two times a day (BID) | ORAL | Status: DC | PRN

## 2022-05-24 MED ORDER — BISACODYL 10 MG RE SUPP
10.0000 mg | Freq: Every day | RECTAL | Status: DC | PRN
Start: 2022-05-24 — End: 2022-05-26

## 2022-05-24 MED ORDER — FREE WATER
30.0000 mL | Status: DC
  Administered 2022-05-24 – 2022-05-26 (×20): 30 mL

## 2022-05-24 MED ORDER — POLYETHYLENE GLYCOL 3350 17 G PO PACK: 17.0000 g | PACK | Freq: Every day | ORAL | Status: DC | PRN

## 2022-05-24 MED ORDER — OSMOLITE 1.5 CAL PO LIQD: 1000.0000 mL | ORAL | Status: DC

## 2022-05-24 MED ORDER — IPRATROPIUM-ALBUTEROL 0.5-2.5 (3) MG/3ML IN SOLN
3.0000 mL | Freq: Four times a day (QID) | RESPIRATORY_TRACT | Status: DC | PRN
  Administered 2022-05-24 – 2022-05-25 (×2): 3 mL via RESPIRATORY_TRACT
  Filled 2022-05-24 (×2): qty 3

## 2022-05-24 MED ORDER — ORAL CARE MOUTH RINSE
15.0000 mL | OROMUCOSAL | Status: DC
  Administered 2022-05-24 – 2022-05-26 (×22): 15 mL via OROMUCOSAL

## 2022-05-24 MED ORDER — ORAL CARE MOUTH RINSE
15.0000 mL | OROMUCOSAL | Status: DC | PRN
Start: 2022-05-24 — End: 2022-05-26

## 2022-05-24 MED ORDER — ASPIRIN 81 MG PO TBEC
81.0000 mg | DELAYED_RELEASE_TABLET | Freq: Every day | ORAL | Status: DC
  Administered 2022-05-25: 81 mg via ORAL
  Filled 2022-05-24: qty 1

## 2022-05-24 MED ORDER — GUAIFENESIN 200 MG PO TABS
200.0000 mg | ORAL_TABLET | Freq: Four times a day (QID) | ORAL | Status: DC
  Administered 2022-05-24 – 2022-05-26 (×7): 200 mg via ORAL
  Filled 2022-05-24 (×8): qty 1

## 2022-05-24 MED ORDER — OSMOLITE 1.5 CAL PO LIQD
1000.0000 mL | ORAL | Status: DC
  Administered 2022-05-24 – 2022-05-25 (×2): 1000 mL

## 2022-05-24 NOTE — H&P (Addendum)
NAME:  Sheila Fernandez, MRN:  [id number], DOB:  08/30/1951, LOS: 0 ADMISSION DATE:  05/24/2022, CONSULTATION DATE:  05/24/22 REFERRING MD:  Owens Shark, CHIEF COMPLAINT:  Syncope   History of Present Illness:  71 year old woman with history of sickle cell trait who had long complex hospitalization at [facility] on from June 13 through April 08 2022 for recurrent respiratory failure, pseudomonas PNA ultimately requiring tracheostomy.  She was transferred to [facility] for ongoing ventilator wean.  Her stay there has been complicated by recurrent bradycardic events with syncope.  This has been limiting her ability to be decannulated.  She is sent here for additional cardiology evaluation.  She is currently comfortable on full vent support.  Denies pain, N/V/Chest pain, palpitations.  Able to write questions/answers on white board.  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  Objective   Blood pressure (!) 160/94, pulse 100, temperature 98.2 F (36.8 C), temperature source Oral, resp. rate (!) 25.    Vent Mode: Other (Comment) FiO2 (%):  [30 %] 30 % Set Rate:  [12 bmp] 12 bmp Vt Set:  [400 mL] 400 mL PEEP:  [5 cmH20] 5 cmH20   Intake/Output Summary (Last 24 hours) at 05/24/2022 1702 Last data filed at 05/24/2022 1600 Gross per 24 hour  Intake --  Output 250 ml  Net -250 ml   There were no vitals filed for this visit.  Examination: General: no distress HENT: trach in place Lungs: rhonci bases, otherwise clear; bedside US showing volume loss on R without effusion Cardiovascular: RRR, ext warm Abdomen: soft, +BS, PEG in place Extremities: no edema Neuro: moves all 4 ext to command Skin: no rashses  Resolved Hospital Problem list   N/A  Assessment & Plan:  Recurrent  symptomatic bradycardic episodes Presumed recurrent HCAP, hx pseudomonas Trach dependence- the bradycardic events above seem to be limiting her ability to wean off vent. Shock- associated with bradycardic events, responds to levophed  - Levophed to MAP 65 - f/u echo - Cardiology [facility]) to weigh in regarding further workup of these bradycardic spells; ?pacemaker eligibility - Check tracheal aspirate, switch to meropenem - Start hypertonic nebs and CPT - Other chronic meds as ordered, appreciate pharmD help - [facility] has agreed to take patient back once these bradycardia episodes have a definitive solution  Best Practice (right click and "Reselect all SmartList Selections" daily)   Diet/type: tubefeeds DVT prophylaxis: LMWH GI prophylaxis: PPI Lines: N/A Foley:  N/A Code Status:  full code Last date of multidisciplinary goals of care discussion [pending]  Labs   CBC: Recent Labs  Lab 05/18/22 0553 05/24/22 0320  WBC 9.7 13.4*  HGB 8.7* 8.1*  HCT 28.4* 26.0*  MCV 89.3 90.3  PLT 220 600    Basic Metabolic Panel: Recent Labs  Lab 05/18/22 0553 05/21/22 0853 05/22/22 0618 05/23/22 1145 05/24/22 0320  NA 141 151* 149* 145 144  K 4.5 4.4  --  4.6 4.5  CL 105 110  --  106 106  CO2 30 33*  --  33* 33*  GLUCOSE 101* 142*  --  245* 159*  BUN 46* 38*  --  49* 63*  CREATININE 0.85 0.73  --  0.83 0.98  CALCIUM 9.6 9.7  --  9.4 9.4  MG  --   --   --  2.1 2.1   GFR: CrCl cannot be calculated (Unknown ideal weight.). Recent Labs  Lab 05/18/22 0553 05/23/22 1617 05/24/22 0320  WBC 9.7  --  13.4*  LATICACIDVEN  --  1.7  --     Liver Function Tests: No results for input(s): "AST", "ALT", "ALKPHOS", "BILITOT", "PROT", "ALBUMIN" in the last 168 hours. No results for input(s): "LIPASE", "AMYLASE" in the last 168 hours. No results for input(s): "AMMONIA" in the last 168 hours.  ABG    Component Value Date/Time   PHART 7.44 05/04/2022 1245   PCO2ART 55 (H)  05/04/2022 1245   PO2ART 87 05/04/2022 1245   HCO3 37.4 (H) 05/04/2022 1245   TCO2 30 03/22/2022 0403   ACIDBASEDEF 2.4 (H) 03/17/2022 1150   O2SAT 98.9 05/04/2022 1245     Coagulation Profile: No results for input(s): "INR", "PROTIME" in the last 168 hours.  Cardiac Enzymes: Recent Labs  Lab 05/23/22 1145 05/23/22 1859 05/24/22 0320  CKTOTAL 19* 16* 19*  CKMB 1.6 1.4 1.0    HbA1C: Hemoglobin A1C  Date/Time Value Ref Range Status  04/16/2015 04:25 PM 5.5  Final   Hgb A1c MFr Bld  Date/Time Value Ref Range Status  09/06/2013 02:25 AM 5.5 <5.7 % Final    Comment:    (NOTE)                                                                       According to the ADA Clinical Practice Recommendations for 2011, when HbA1c is used as a screening test:  >=6.5%   Diagnostic of Diabetes Mellitus           (if abnormal result is confirmed) 5.7-6.4%   Increased risk of developing Diabetes Mellitus References:Diagnosis and Classification of Diabetes Mellitus,Diabetes QZES,9233,00(TMAUQ 1):S62-S69 and Standards of Medical Care in         Diabetes - 2011,Diabetes JFHL,4562,56 (Suppl 1):S11-S61.    CBG: No results for input(s): "GLUCAP" in the last 168 hours.  Review of Systems:    Positive Symptoms in bold:  Constitutional fevers, chills, weight loss, fatigue, anorexia, malaise  Eyes decreased vision, double vision, eye irritation  Ears, Nose, Mouth, Throat sore throat, trouble swallowing, sinus congestion  Cardiovascular chest pain, paroxysmal nocturnal dyspnea, lower ext edema, palpitations   Respiratory SOB, cough, DOE, hemoptysis, wheezing  Gastrointestinal nausea, vomiting, diarrhea  Genitourinary burning with urination, trouble urinating  Musculoskeletal joint aches, joint swelling, back pain  Integumentary  rashes, skin lesions  Neurological focal weakness, focal numbness, trouble speaking, headaches  Psychiatric depression, anxiety, confusion  Endocrine polyuria,  polydipsia, cold intolerance, heat intolerance  Hematologic abnormal bruising, abnormal bleeding, unexplained nose bleeds  Allergic/Immunologic recurrent infections, hives, swollen lymph nodes     Past Medical History:  She,  has a past medical history of Adenomyosis, Anemia, Arthritis, Hypertension, Iritis, recurrent, Orthostatic hypotension (01/21/2019), Sickle cell trait ([place]), and Uterine fibroid.   Surgical History:   Past Surgical History:  Procedure Laterality Date   BREAST BIOPSY Left 1986   benign excisional no scar seen    BREAST LUMPECTOMY Left    Pt does not recall having lumpectomy   CESAREAN SECTION     CHOLECYSTECTOMY     FOOT SURGERY     cyst removed  IR GASTROSTOMY TUBE MOD SED  05/05/2022     Social History:   reports that she quit smoking about 18 years ago. Her smoking use included cigarettes. She has a 15.00 pack-year smoking history. She has never used smokeless tobacco. She reports that she does not drink alcohol and does not use drugs.   Family History:  Her family history includes Breast cancer in her sister; Cancer in her brother and sister; Cancer (age of onset: 20) in her mother; Cancer (age of onset: 72) in her father; Diabetes in her brother, maternal grandmother, and sister; HIV in her sister; Hypertension in her brother, father, and mother; Kidney disease in her brother; Lupus in her daughter; Stroke in her father. There is no history of Colon cancer, Esophageal cancer, Stomach cancer, or Colon polyps.   Allergies Allergies  Allergen Reactions   Ativan [Lorazepam] Other (See Comments)    Severe lethargy and unable to remember events    Codeine Nausea And Vomiting     Home Medications  Prior to Admission medications   Medication Sig Start Date End Date Taking? Authorizing Provider  albuterol (PROVENTIL) (2.5 MG/3ML) 0.083% nebulizer solution Take 3 mLs (2.5 mg total) by nebulization every 4 (four) hours as needed for wheezing or shortness of  breath. 04/08/22   Mick Sell, PA-C  docusate (COLACE) 50 MG/5ML liquid Place 10 mLs (100 mg total) into feeding tube 2 (two) times daily as needed for mild constipation. 04/08/22   Mick Sell, PA-C  guaiFENesin (ROBITUSSIN) 100 MG/5ML liquid Place 15 mLs into feeding tube every 6 (six) hours. 04/08/22   Mick Sell, PA-C  lip balm (CARMEX) ointment Apply topically as needed for lip care. Patient taking differently: Apply 1 Application topically as needed for lip care. 04/08/22   Mick Sell, PA-C  Multiple Vitamin (MULTIVITAMIN WITH MINERALS) TABS tablet Place 1 tablet into feeding tube daily. 04/09/22   Mick Sell, PA-C  ondansetron (ZOFRAN) 4 MG/2ML SOLN injection Inject 2 mLs (4 mg total) into the vein every 6 (six) hours as needed for nausea. 04/08/22   Mick Sell, PA-C  pantoprazole (PROTONIX) 40 MG tablet Take 40 mg by mouth daily. 02/20/22   [provider]  polyethylene glycol (MIRALAX / GLYCOLAX) 17 g packet Place 17 g into feeding tube daily as needed for moderate constipation. 04/08/22   Mick Sell, PA-C  QUEtiapine (SEROQUEL) 50 MG tablet Place 1 tablet (50 mg total) into feeding tube 2 (two) times daily. 04/08/22   Mick Sell, PA-C  thiamine 100 MG tablet Place 1 tablet (100 mg total) into feeding tube daily. 04/09/22   Mick Sell, PA-C  tobramycin, PF, (TOBI) 300 MG/5ML nebulizer solution Take 5 mLs (300 mg total) by nebulization 2 (two) times daily. 04/08/22   Mick Sell, PA-C     Critical care time: 31 mins

## 2022-05-24 NOTE — Consult Note (Signed)
Ref: Margretta Sidle, MD   Subjective:  Resting comfortably. Had episode of hypotension yesterday improved with IV fluids. Improved HR without B-blocker use.  Objective:  Vital Signs in the last 24 hours:  P: 64, R: 16, BP : 120/70, O2 sat 100 % on 28 % FiO2.  Physical Exam: BP Readings from Last 1 Encounters:  05/05/22 (!) 180/82     Wt Readings from Last 1 Encounters:  04/08/22 55.4 kg    Weight change:  There is no height or weight on file to calculate BMI. HEENT: [place]/AT, Eyes-Brown, Conjunctiva-Pale, Sclera-Non-icteric Neck: No JVD, No bruit, Trachea midline. Lungs:  Clearing, Bilateral. Cardiac:  Regular rhythm, normal S1 and S2, no S3. II/VI systolic murmur. Abdomen:  Soft, non-tender. BS present. Extremities:  No edema present. No cyanosis. No clubbing. CNS: AxOx1, Cranial nerves grossly intact, moves all 4 extremities.  Skin: Warm and dry.   Intake/Output from previous day: No intake/output data recorded.    Lab Results: BMET    Component Value Date/Time   NA 144 05/24/2022 0320   NA 145 05/23/2022 1145   NA 149 (H) 05/22/2022 0618   NA 138 06/10/2017 1238   K 4.5 05/24/2022 0320   K 4.6 05/23/2022 1145   K 4.4 05/21/2022 0853   K 4.0 06/10/2017 1238   CL 106 05/24/2022 0320   CL 106 05/23/2022 1145   CL 110 05/21/2022 0853   CO2 33 (H) 05/24/2022 0320   CO2 33 (H) 05/23/2022 1145   CO2 33 (H) 05/21/2022 0853   CO2 23 06/10/2017 1238   GLUCOSE 159 (H) 05/24/2022 0320   GLUCOSE 245 (H) 05/23/2022 1145   GLUCOSE 142 (H) 05/21/2022 0853   GLUCOSE 93 06/10/2017 1238   BUN 63 (H) 05/24/2022 0320   BUN 49 (H) 05/23/2022 1145   BUN 38 (H) 05/21/2022 0853   BUN 18.5 06/10/2017 1238   CREATININE 0.98 05/24/2022 0320   CREATININE 0.83 05/23/2022 1145   CREATININE 0.73 05/21/2022 0853   CREATININE 0.8 06/10/2017 1238   CREATININE 0.71 03/06/2015 0826   CREATININE 0.65 02/03/2011 1255   CALCIUM 9.4 05/24/2022 0320   CALCIUM 9.4 05/23/2022 1145   CALCIUM  9.7 05/21/2022 0853   CALCIUM 9.8 06/10/2017 1238   GFRNONAA >60 05/24/2022 0320   GFRNONAA >60 05/23/2022 1145   GFRNONAA >60 05/21/2022 0853   GFRNONAA >60 02/03/2011 1255   GFRAA >90 09/06/2013 0225   GFRAA >90 09/05/2013 1859   GFRAA >60 02/03/2011 1255   CBC    Component Value Date/Time   WBC 13.4 (H) 05/24/2022 0320   RBC 2.88 (L) 05/24/2022 0320   HGB 8.1 (L) 05/24/2022 0320   HGB 12.1 06/10/2017 1238   HCT 26.0 (L) 05/24/2022 0320   HCT 36.7 06/10/2017 1238   PLT 244 05/24/2022 0320   PLT 227 06/10/2017 1238   MCV 90.3 05/24/2022 0320   MCV 82.7 06/10/2017 1238   MCH 28.1 05/24/2022 0320   MCHC 31.2 05/24/2022 0320   RDW 17.4 (H) 05/24/2022 0320   RDW 15.8 (H) 06/10/2017 1238   LYMPHSABS 1.7 04/08/2022 0336   LYMPHSABS 2.3 06/10/2017 1238   MONOABS 0.6 04/08/2022 0336   MONOABS 0.2 06/10/2017 1238   EOSABS 0.3 04/08/2022 0336   EOSABS 0.2 06/10/2017 1238   BASOSABS 0.1 04/08/2022 0336   BASOSABS 0.0 06/10/2017 1238   HEPATIC Function Panel Recent Labs    05/04/22 0143 05/05/22 1105 05/08/22 0518  PROT 7.0 6.8 6.4*  ALBUMIN 3.4* 3.4* 3.0*  AST 115* 34 18  ALT 154* 88* 42  ALKPHOS 78 76 74   HEMOGLOBIN A1C Lab Results  Component Value Date   MPG 111 09/06/2013   CARDIAC ENZYMES Lab Results  Component Value Date   CKTOTAL 19 (L) 05/24/2022   CKMB 1.0 05/24/2022   TROPONINI <0.30 09/06/2013   TROPONINI <0.30 09/06/2013   TROPONINI <0.30 09/06/2013   BNP No results for input(s): "PROBNP" in the last 8760 hours. TSH Recent Labs    03/29/22 0638 04/09/22 0555 04/18/22 0643  TSH 1.709 0.802 1.597   CHOLESTEROL No results for input(s): "CHOL" in the last 8760 hours.  Scheduled Meds:  Continuous Infusions:   ceFAZolin (ANCEF) IV     PRN Meds:.  Assessment/Plan:  Sinus bradycardia, resolved Adverse effect of b-blocker HTN Pneumonia S/P tracheostomy Sickle cell anemia  Plan: Awaiting echocardiogram. Continue emdical  treatment.   LOS: 0 days   Time spent including chart review, lab review, examination, discussion with patient/Family : 30 min   Dixie Dials  MD  05/24/2022, 10:49 AM

## 2022-05-24 NOTE — Consult Note (Signed)
Ref: Margretta Sidle, MD   Subjective:  Episode of severe bradycardia improves with IV levophed use. Family asking for ATTR-CM testing as suggested to them by Dr. Einar Gip She is back on ventilator support.  Objective:  Vital Signs in the last 24 hours:  P: 70, R: 16 BP: 120/60, O2 sat 100 % on 40 % FiO2.  Physical Exam: BP Readings from Last 1 Encounters:  05/05/22 (!) 180/82     Wt Readings from Last 1 Encounters:  04/08/22 55.4 kg    Weight change:  There is no height or weight on file to calculate BMI. HEENT: [place]/AT, Eyes-Brown, Conjunctiva-Pale, Sclera-Non-icteric Neck: No JVD, No bruit, Tracheostomy present. Lungs:  Clear, Bilateral. Cardiac:  Regular rhythm, normal S1 and S2, no S3. II/VI systolic murmur. Abdomen:  Soft, non-tender. BS present. Extremities:  No edema present. No cyanosis. No clubbing. CNS: AxOx3, Cranial nerves grossly intact, moves all 4 extremities.  Skin: Warm and dry.   Intake/Output from previous day: No intake/output data recorded.    Lab Results: BMET    Component Value Date/Time   NA 144 05/24/2022 0320   NA 145 05/23/2022 1145   NA 149 (H) 05/22/2022 0618   NA 138 06/10/2017 1238   K 4.5 05/24/2022 0320   K 4.6 05/23/2022 1145   K 4.4 05/21/2022 0853   K 4.0 06/10/2017 1238   CL 106 05/24/2022 0320   CL 106 05/23/2022 1145   CL 110 05/21/2022 0853   CO2 33 (H) 05/24/2022 0320   CO2 33 (H) 05/23/2022 1145   CO2 33 (H) 05/21/2022 0853   CO2 23 06/10/2017 1238   GLUCOSE 159 (H) 05/24/2022 0320   GLUCOSE 245 (H) 05/23/2022 1145   GLUCOSE 142 (H) 05/21/2022 0853   GLUCOSE 93 06/10/2017 1238   BUN 63 (H) 05/24/2022 0320   BUN 49 (H) 05/23/2022 1145   BUN 38 (H) 05/21/2022 0853   BUN 18.5 06/10/2017 1238   CREATININE 0.98 05/24/2022 0320   CREATININE 0.83 05/23/2022 1145   CREATININE 0.73 05/21/2022 0853   CREATININE 0.8 06/10/2017 1238   CREATININE 0.71 03/06/2015 0826   CREATININE 0.65 02/03/2011 1255   CALCIUM 9.4 05/24/2022  0320   CALCIUM 9.4 05/23/2022 1145   CALCIUM 9.7 05/21/2022 0853   CALCIUM 9.8 06/10/2017 1238   GFRNONAA >60 05/24/2022 0320   GFRNONAA >60 05/23/2022 1145   GFRNONAA >60 05/21/2022 0853   GFRNONAA >60 02/03/2011 1255   GFRAA >90 09/06/2013 0225   GFRAA >90 09/05/2013 1859   GFRAA >60 02/03/2011 1255   CBC    Component Value Date/Time   WBC 13.4 (H) 05/24/2022 0320   RBC 2.88 (L) 05/24/2022 0320   HGB 8.1 (L) 05/24/2022 0320   HGB 12.1 06/10/2017 1238   HCT 26.0 (L) 05/24/2022 0320   HCT 36.7 06/10/2017 1238   PLT 244 05/24/2022 0320   PLT 227 06/10/2017 1238   MCV 90.3 05/24/2022 0320   MCV 82.7 06/10/2017 1238   MCH 28.1 05/24/2022 0320   MCHC 31.2 05/24/2022 0320   RDW 17.4 (H) 05/24/2022 0320   RDW 15.8 (H) 06/10/2017 1238   LYMPHSABS 1.7 04/08/2022 0336   LYMPHSABS 2.3 06/10/2017 1238   MONOABS 0.6 04/08/2022 0336   MONOABS 0.2 06/10/2017 1238   EOSABS 0.3 04/08/2022 0336   EOSABS 0.2 06/10/2017 1238   BASOSABS 0.1 04/08/2022 0336   BASOSABS 0.0 06/10/2017 1238   HEPATIC Function Panel Recent Labs    05/04/22 0143 05/05/22 1105 05/08/22 0518  PROT 7.0 6.8 6.4*  ALBUMIN 3.4* 3.4* 3.0*  AST 115* 34 18  ALT 154* 88* 42  ALKPHOS 78 76 74   HEMOGLOBIN A1C Lab Results  Component Value Date   MPG 111 09/06/2013   CARDIAC ENZYMES Lab Results  Component Value Date   CKTOTAL 19 (L) 05/24/2022   CKMB 1.0 05/24/2022   TROPONINI <0.30 09/06/2013   TROPONINI <0.30 09/06/2013   TROPONINI <0.30 09/06/2013   BNP No results for input(s): "PROBNP" in the last 8760 hours. TSH Recent Labs    03/29/22 0638 04/09/22 0555 04/18/22 0643  TSH 1.709 0.802 1.597   CHOLESTEROL No results for input(s): "CHOL" in the last 8760 hours.  Scheduled Meds:  Continuous Infusions:   ceFAZolin (ANCEF) IV     PRN Meds:.  Assessment/Plan:  Severe bradycardia on B-blocker use. HTN Sickle cell anemia Pneumonia S/P tracheostomy  Plan: DC carvedilol and  metoprolol. Agree with levophed support till B-blocker is out of system.   LOS: 0 days   Time spent including chart review, lab review, examination, discussion with patient/Doctor/Nurse/Family : 40 min   Dixie Dials  MD  05/24/2022, 10:12 AM

## 2022-05-24 NOTE — Progress Notes (Signed)
Pharmacy Antibiotic Note  Sheila Fernandez is a 71 y.o. female readmitted on 05/24/2022 from [facility] after prolonged complicated hospitalization at [facility]. Pt has hx HCAP and Pseudomonas and was on cefepime/metrondiazole at [facility], pharmacy has been consulted for meropenem dosing. CrCl ~ 40.  Plan: Meropenem 1g IV q12h     Temp (24hrs), Avg:98.2 F (36.8 C), Min:98.2 F (36.8 C), Max:98.2 F (36.8 C)  Recent Labs  Lab 05/18/22 0553 05/21/22 0853 05/23/22 1145 05/23/22 1617 05/24/22 0320  WBC 9.7  --   --   --  13.4*  CREATININE 0.85 0.73 0.83  --  0.98  LATICACIDVEN  --   --   --  1.7  --     CrCl cannot be calculated (Unknown ideal weight.).    Allergies  Allergen Reactions   Ativan [Lorazepam] Other (See Comments)    Severe lethargy and unable to remember events    Codeine Nausea And Vomiting    Arrie Senate, PharmD, BCPS, [facility] Clinical Pharmacist [phone] Please check AMION for all [place] numbers 05/24/2022

## 2022-05-25 ENCOUNTER — Other Ambulatory Visit: Payer: Self-pay

## 2022-05-25 ENCOUNTER — Encounter (HOSPITAL_COMMUNITY): Payer: Self-pay | Admitting: Internal Medicine

## 2022-05-25 DIAGNOSIS — R001 Bradycardia, unspecified: Secondary | ICD-10-CM | POA: Diagnosis not present

## 2022-05-25 LAB — BASIC METABOLIC PANEL
Anion gap: 5 (ref 5–15)
BUN: 40 mg/dL — ABNORMAL HIGH (ref 8–23)
CO2: 34 mmol/L — ABNORMAL HIGH (ref 22–32)
Calcium: 9.1 mg/dL (ref 8.9–10.3)
Chloride: 108 mmol/L (ref 98–111)
Creatinine, Ser: 0.64 mg/dL (ref 0.44–1.00)
GFR, Estimated: >60 mL/min (ref >60)
Glucose, Bld: 152 mg/dL — ABNORMAL HIGH (ref 70–99)
Potassium: 4.2 mmol/L (ref 3.5–5.1)
Sodium: 147 mmol/L — ABNORMAL HIGH (ref 135–145)

## 2022-05-25 LAB — CBC
HCT: 24.6 % — ABNORMAL LOW (ref 36.0–46.0)
Hemoglobin: 7.7 g/dL — ABNORMAL LOW (ref 12.0–15.0)
MCH: 28.5 pg (ref 26.0–34.0)
MCHC: 31.3 g/dL (ref 30.0–36.0)
MCV: 91.1 fL (ref 80.0–100.0)
Platelets: 203 10*3/uL (ref 150–400)
RBC: 2.7 MIL/uL — ABNORMAL LOW (ref 3.87–5.11)
RDW: 18.1 % — ABNORMAL HIGH (ref 11.5–15.5)
WBC: 9.7 10*3/uL (ref 4.0–10.5)
nRBC: 0 % (ref 0.0–0.2)

## 2022-05-25 LAB — ECHOCARDIOGRAM COMPLETE
Area-P 1/2: 4.21 cm2
S' Lateral: 2 cm
Weight: 1954.16 oz

## 2022-05-25 LAB — CULTURE, RESPIRATORY W GRAM STAIN

## 2022-05-25 LAB — MAGNESIUM: Magnesium: 2.2 mg/dL (ref 1.7–2.4)

## 2022-05-25 MED ORDER — HYDRALAZINE HCL 20 MG/ML IJ SOLN
10.0000 mg | INTRAMUSCULAR | Status: DC | PRN
  Administered 2022-05-25: 10 mg via INTRAVENOUS
  Filled 2022-05-25 (×2): qty 1

## 2022-05-25 MED ORDER — SODIUM CHLORIDE 0.9 % IV SOLN
1.0000 g | Freq: Three times a day (TID) | INTRAVENOUS | Status: DC
  Administered 2022-05-25 – 2022-05-26 (×3): 1 g via INTRAVENOUS
  Filled 2022-05-25 (×5): qty 20

## 2022-05-25 MED ORDER — CHLORHEXIDINE GLUCONATE CLOTH 2 % EX PADS
6.0000 | MEDICATED_PAD | Freq: Every day | CUTANEOUS | Status: DC
  Administered 2022-05-25 – 2022-05-26 (×2): 6 via TOPICAL

## 2022-05-25 NOTE — Progress Notes (Addendum)
NAME:  Sheila Fernandez, MRN:  [id number], DOB:  May 10, 1951, LOS: 1 ADMISSION DATE:  05/24/2022, CONSULTATION DATE:  05/24/22 REFERRING MD:  Owens Shark, CHIEF COMPLAINT:  Syncope   History of Present Illness:  71 year old woman with history of sickle cell trait who had long complex hospitalization at [facility] on from June 13 through April 08 2022 for recurrent respiratory failure, pseudomonas PNA ultimately requiring tracheostomy.  She was transferred to [facility] for ongoing ventilator wean.  Her stay there has been complicated by recurrent bradycardic events with syncope.  This has been limiting her ability to be decannulated.  She is sent here for additional cardiology evaluation.  She is currently comfortable on full vent support.  Denies pain, N/V/Chest pain, palpitations.  Able to write questions/answers on white board.  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  Cardiology consulted yesterday who held the Coreg and Lopressor No more episodes of bradycardia noted overnight  Objective   Blood pressure 103/69, pulse 64, temperature 98.7 F (37.1 C), temperature source Oral, resp. rate 16, weight 60 kg, SpO2 100 %.    Vent Mode: Other (Comment) FiO2 (%):  [30 %] 30 % Set Rate:  [12 bmp] 12 bmp Vt Set:  [400 mL] 400 mL PEEP:  [5 cmH20] 5 cmH20 Plateau Pressure:  [12 cmH20-16 cmH20] 16 cmH20   Intake/Output Summary (Last 24 hours) at 05/25/2022 0740 Last data filed at 05/25/2022 0400 Gross per 24 hour  Intake 780.06 ml  Output 970 ml  Net -189.94 ml   Filed Weights   05/25/22 0500  Weight: 60 kg    Examination: Gen:      No acute distress, chronically ill-appearing HEENT:  EOMI, sclera anicteric Neck:     No masses; no thyromegaly,  tracheostomy Lungs:    Tattered rhonchi CV:         Regular rate and rhythm; no murmurs Abd:      PEG tube in place, positive bowel sounds Ext:    No edema; adequate peripheral perfusion Skin:      Warm and dry; no rash Neuro:   Labs/imaging reviewed Significant for sodium 147, hemoglobin 7.7, platelets 203 Echocardiogram yesterday shows LVEF 78-29%, grade 1 diastolic dysfunction with normal RV systolic size and function   Resolved Hospital Problem list   N/A  Assessment & Plan:  Recurrent symptomatic bradycardic episodes Continue telemetry monitoring.  Echo is unremarkable Cardiology is consulted.  Coreg and Lopressor held Has been off Levophed since yesterday  Presumed recurrent HCAP, hx pseudomonas Trach dependence- the bradycardic events above seem to be limiting her ability to wean off vent. Checking tracheal aspirate cultures Antibiotics switched to meropenem Hypertonic saline, chest physiotherapy, bronchial hygiene Routine trach care  Anxiety Klonopin as needed  Anemia No active bleed.  Monitor CBC.  [facility] patient [facility] once these bradycardia episodes have a definitive solution  Best Practice (right click and "Reselect all SmartList Selections" daily)   Diet/type: tubefeeds DVT prophylaxis: LMWH GI prophylaxis: PPI Lines: N/A Foley:  N/A Code Status:  full code Last date of multidisciplinary goals of care discussion [pending]  Critical care time:    The patient is critically ill with multiple organ system failure and requires high complexity decision making for assessment and support,  frequent evaluation and titration of therapies, advanced monitoring, review of radiographic studies and interpretation of complex data.   Critical Care Time devoted to patient care services, exclusive of separately billable procedures, described in this note is 45 minutes.   Marshell Garfinkel MD [facility] See Amion for  pager  If no response to pager , please call [phone] until 7pm After 7:00 pm call Elink  [phone] 05/25/2022, 7:41 AM

## 2022-05-25 NOTE — Progress Notes (Signed)
Foley inserted at 1730. Foley to be removed 05/26/22 at 1730 after 24 hour urine specimen collected.

## 2022-05-25 NOTE — Progress Notes (Signed)
Dr. Doylene Canard paged r/t 24 hour urine order. Pt incontinent of urine and stool, unable to collect specimen without foley. MD gave order for foley for 24 hours. Foley to be removed after 24 hour urine specimen collected.

## 2022-05-25 NOTE — Progress Notes (Signed)
RT placed patient on ATC 8L 35% per MD. Patient tolerating well at this time and no distress noted. RN aware. RT will continue to monitor.

## 2022-05-25 NOTE — Consult Note (Signed)
Ref: Margretta Sidle, MD   Subjective:  Awake. HR in 90's. BP elevated periodically. Sinus rhythm continues. Patient has carpal tunnel syndrome and Echocardiogram shows borderline LVH with hyperdynamic LV systolic function. LVEF 70-75 % ATTR CM is 90 % in males. Hence ATTR CM is less likely.   Objective:  Vital Signs in the last 24 hours: Temp:  [98.2 F (36.8 C)-98.7 F (37.1 C)] 98.4 F (36.9 C) (08/27 0800) Pulse Rate:  [64-100] 82 (08/27 0800) Cardiac Rhythm: Normal sinus rhythm (08/27 0800) Resp:  [8-25] 21 (08/27 0800) BP: (81-160)/(60-94) 123/92 (08/27 0800) SpO2:  [98 %-100 %] 99 % (08/27 0800) FiO2 (%):  [30 %-35 %] 30 % (08/27 0800) Weight:  [60 kg] 60 kg (08/27 0500)  Physical Exam: BP Readings from Last 1 Encounters:  05/25/22 (!) 123/92     Wt Readings from Last 1 Encounters:  05/25/22 60 kg    Weight change:  Body mass index is 24.99 kg/m. HEENT: [place]/AT, Eyes-Brown, Conjunctiva-Pale, Sclera-Non-icteric Neck: No JVD, No bruit, Tracheostomy present. Lungs:  Coarse, Bilateral. Cardiac:  Regular rhythm, normal S1 and S2, no S3. II/VI systolic murmur. Abdomen:  Soft, non-tender. BS present. Extremities:  No edema present. No cyanosis. No clubbing. CNS: AxOx3, Cranial nerves grossly intact, moves all 4 extremities.  Skin: Warm and dry.   Intake/Output from previous day: 08/26 0701 - 08/27 0700 In: 780.1 [NG/GT:650; IV Piggyback:100.1] Out: 970 [Urine:970]    Lab Results: BMET    Component Value Date/Time   NA 147 (H) 05/25/2022 0526   NA 145 05/24/2022 1837   NA 144 05/24/2022 0320   NA 138 06/10/2017 1238   K 4.2 05/25/2022 0526   K 4.4 05/24/2022 1837   K 4.5 05/24/2022 0320   K 4.0 06/10/2017 1238   CL 108 05/25/2022 0526   CL 107 05/24/2022 1837   CL 106 05/24/2022 0320   CO2 34 (H) 05/25/2022 0526   CO2 31 05/24/2022 1837   CO2 33 (H) 05/24/2022 0320   CO2 23 06/10/2017 1238   GLUCOSE 152 (H) 05/25/2022 0526   GLUCOSE 221 (H) 05/24/2022  1837   GLUCOSE 159 (H) 05/24/2022 0320   GLUCOSE 93 06/10/2017 1238   BUN 40 (H) 05/25/2022 0526   BUN 39 (H) 05/24/2022 1837   BUN 63 (H) 05/24/2022 0320   BUN 18.5 06/10/2017 1238   CREATININE 0.64 05/25/2022 0526   CREATININE 0.68 05/24/2022 1837   CREATININE 0.98 05/24/2022 0320   CREATININE 0.8 06/10/2017 1238   CREATININE 0.71 03/06/2015 0826   CREATININE 0.65 02/03/2011 1255   CALCIUM 9.1 05/25/2022 0526   CALCIUM 9.2 05/24/2022 1837   CALCIUM 9.4 05/24/2022 0320   CALCIUM 9.8 06/10/2017 1238   GFRNONAA >60 05/25/2022 0526   GFRNONAA >60 05/24/2022 1837   GFRNONAA >60 05/24/2022 0320   GFRNONAA >60 02/03/2011 1255   GFRAA >90 09/06/2013 0225   GFRAA >90 09/05/2013 1859   GFRAA >60 02/03/2011 1255   CBC    Component Value Date/Time   WBC 9.7 05/25/2022 0526   RBC 2.70 (L) 05/25/2022 0526   HGB 7.7 (L) 05/25/2022 0526   HGB 12.1 06/10/2017 1238   HCT 24.6 (L) 05/25/2022 0526   HCT 36.7 06/10/2017 1238   PLT 203 05/25/2022 0526   PLT 227 06/10/2017 1238   MCV 91.1 05/25/2022 0526   MCV 82.7 06/10/2017 1238   MCH 28.5 05/25/2022 0526   MCHC 31.3 05/25/2022 0526   RDW 18.1 (H) 05/25/2022 0526   RDW  15.8 (H) 06/10/2017 1238   LYMPHSABS 1.7 04/08/2022 0336   LYMPHSABS 2.3 06/10/2017 1238   MONOABS 0.6 04/08/2022 0336   MONOABS 0.2 06/10/2017 1238   EOSABS 0.3 04/08/2022 0336   EOSABS 0.2 06/10/2017 1238   BASOSABS 0.1 04/08/2022 0336   BASOSABS 0.0 06/10/2017 1238   HEPATIC Function Panel Recent Labs    05/04/22 0143 05/05/22 1105 05/08/22 0518  PROT 7.0 6.8 6.4*  ALBUMIN 3.4* 3.4* 3.0*  AST 115* 34 18  ALT 154* 88* 42  ALKPHOS 78 76 74   HEMOGLOBIN A1C Lab Results  Component Value Date   MPG 111 09/06/2013   CARDIAC ENZYMES Lab Results  Component Value Date   CKTOTAL 19 (L) 05/24/2022   CKMB 1.0 05/24/2022   TROPONINI <0.30 09/06/2013   TROPONINI <0.30 09/06/2013   TROPONINI <0.30 09/06/2013   BNP No results for input(s): "PROBNP" in the  last 8760 hours. TSH Recent Labs    03/29/22 0638 04/09/22 0555 04/18/22 0643  TSH 1.709 0.802 1.597   CHOLESTEROL No results for input(s): "CHOL" in the last 8760 hours.  Scheduled Meds:  aspirin EC  81 mg Oral Daily   Chlorhexidine Gluconate Cloth  6 each Topical Daily   enoxaparin (LOVENOX) injection  40 mg Subcutaneous Q24H   free water  30 mL Per Tube Q2H   guaiFENesin  200 mg Oral Q6H   melatonin  3 mg Oral QHS   mouth rinse  15 mL Mouth Rinse Q2H   pantoprazole  40 mg Oral Daily   predniSONE  20 mg Oral Q breakfast   sodium chloride HYPERTONIC  4 mL Nebulization BID   thiamine  100 mg Oral Daily   Continuous Infusions:  feeding supplement (OSMOLITE 1.5 CAL) 45 mL/hr at 05/25/22 0800   meropenem (MERREM) IV Stopped (05/24/22 2116)   PRN Meds:.acetaminophen, bisacodyl, clonazePAM, docusate sodium, hydrALAZINE, ipratropium-albuterol, mouth rinse, polyethylene glycol  Assessment/Plan:  Acute on chronic respiratory failure with hypoxia  Sinus bradycardia resolved HTN Bilateral pneumonia, HCM Sickle cell anemia S/P tracheostomy  Plan: Use hydralazine for BP control as needed. Will check serum and urine protein electrophoresis. May use small amount of B-blocker if needed for sinus tachycardia.   LOS: 1 day   Time spent including chart review, lab review, examination, discussion with patient/Family : 30 min   Dixie Dials  MD  05/25/2022, 10:02 AM

## 2022-05-26 ENCOUNTER — Institutional Professional Consult (permissible substitution)
Admission: AD | Admit: 2022-05-26 | Discharge: 2022-08-08 | Payer: Medicare Other | Source: Other Acute Inpatient Hospital

## 2022-05-26 DIAGNOSIS — R001 Bradycardia, unspecified: Secondary | ICD-10-CM | POA: Diagnosis not present

## 2022-05-26 DIAGNOSIS — Z930 Tracheostomy status: Secondary | ICD-10-CM | POA: Diagnosis not present

## 2022-05-26 DIAGNOSIS — J90 Pleural effusion, not elsewhere classified: Secondary | ICD-10-CM | POA: Diagnosis present

## 2022-05-26 DIAGNOSIS — J9621 Acute and chronic respiratory failure with hypoxia: Secondary | ICD-10-CM | POA: Diagnosis not present

## 2022-05-26 DIAGNOSIS — J151 Pneumonia due to Pseudomonas: Secondary | ICD-10-CM | POA: Diagnosis present

## 2022-05-26 DIAGNOSIS — D573 Sickle-cell trait: Secondary | ICD-10-CM | POA: Diagnosis present

## 2022-05-26 LAB — CBC
HCT: 30 % — ABNORMAL LOW (ref 36.0–46.0)
Hemoglobin: 9.2 g/dL — ABNORMAL LOW (ref 12.0–15.0)
MCH: 28.4 pg (ref 26.0–34.0)
MCHC: 30.7 g/dL (ref 30.0–36.0)
MCV: 92.6 fL (ref 80.0–100.0)
Platelets: 221 10*3/uL (ref 150–400)
RBC: 3.24 MIL/uL — ABNORMAL LOW (ref 3.87–5.11)
RDW: 18 % — ABNORMAL HIGH (ref 11.5–15.5)
WBC: 10 10*3/uL (ref 4.0–10.5)
nRBC: 0 % (ref 0.0–0.2)

## 2022-05-26 LAB — BASIC METABOLIC PANEL
Anion gap: 4 — ABNORMAL LOW (ref 5–15)
BUN: 21 mg/dL (ref 8–23)
CO2: 36 mmol/L — ABNORMAL HIGH (ref 22–32)
Calcium: 9.2 mg/dL (ref 8.9–10.3)
Chloride: 109 mmol/L (ref 98–111)
Creatinine, Ser: 0.52 mg/dL (ref 0.44–1.00)
GFR, Estimated: >60 mL/min (ref >60)
Glucose, Bld: 136 mg/dL — ABNORMAL HIGH (ref 70–99)
Potassium: 4.4 mmol/L (ref 3.5–5.1)
Sodium: 149 mmol/L — ABNORMAL HIGH (ref 135–145)

## 2022-05-26 LAB — GLUCOSE, CAPILLARY: Glucose-Capillary: 169 mg/dL — ABNORMAL HIGH (ref 70–99)

## 2022-05-26 LAB — MAGNESIUM: Magnesium: 2.1 mg/dL (ref 1.7–2.4)

## 2022-05-26 MED ORDER — ACETAMINOPHEN 160 MG/5ML PO SOLN: 650.0000 mg | Freq: Four times a day (QID) | ORAL | Status: DC | PRN

## 2022-05-26 MED ORDER — ORAL CARE MOUTH RINSE: 15.0000 mL | OROMUCOSAL | 0 refills | Status: AC

## 2022-05-26 MED ORDER — ASPIRIN 81 MG PO CHEW: 81.0000 mg | CHEWABLE_TABLET | Freq: Every day | ORAL | Status: AC

## 2022-05-26 MED ORDER — CHLORHEXIDINE GLUCONATE CLOTH 2 % EX PADS: 6.0000 | MEDICATED_PAD | Freq: Every day | CUTANEOUS | Status: AC

## 2022-05-26 MED ORDER — PANTOPRAZOLE SODIUM 40 MG PO PACK: 40.0000 mg | PACK | Freq: Every day | ORAL | Status: AC

## 2022-05-26 MED ORDER — IPRATROPIUM-ALBUTEROL 0.5-2.5 (3) MG/3ML IN SOLN: 3.0000 mL | Freq: Four times a day (QID) | RESPIRATORY_TRACT | Status: AC | PRN

## 2022-05-26 MED ORDER — PANTOPRAZOLE 2 MG/ML SUSPENSION
40.0000 mg | Freq: Every day | ORAL | Status: DC
  Administered 2022-05-26: 40 mg
  Filled 2022-05-26: qty 20

## 2022-05-26 MED ORDER — DOCUSATE SODIUM 50 MG/5ML PO LIQD: 100.0000 mg | Freq: Two times a day (BID) | ORAL | 0 refills | Status: AC | PRN

## 2022-05-26 MED ORDER — CLONAZEPAM 0.5 MG PO TABS: 0.5000 mg | ORAL_TABLET | Freq: Four times a day (QID) | ORAL | 0 refills | Status: AC | PRN

## 2022-05-26 MED ORDER — FREE WATER: 200.0000 mL | Status: AC

## 2022-05-26 MED ORDER — CLONAZEPAM 0.5 MG PO TABS: 0.5000 mg | ORAL_TABLET | Freq: Four times a day (QID) | ORAL | Status: DC | PRN

## 2022-05-26 MED ORDER — DOCUSATE SODIUM 50 MG/5ML PO LIQD
100.0000 mg | Freq: Two times a day (BID) | ORAL | Status: DC | PRN
Start: 2022-05-26 — End: 2022-05-26

## 2022-05-26 MED ORDER — ACETAMINOPHEN 160 MG/5ML PO SOLN: 650.0000 mg | Freq: Four times a day (QID) | ORAL | 0 refills | Status: AC | PRN

## 2022-05-26 MED ORDER — GUAIFENESIN 100 MG/5ML PO LIQD: 10.0000 mL | Freq: Three times a day (TID) | ORAL | 0 refills | Status: AC

## 2022-05-26 MED ORDER — ASPIRIN 81 MG PO CHEW
81.0000 mg | CHEWABLE_TABLET | Freq: Every day | ORAL | Status: DC
  Administered 2022-05-26: 81 mg
  Filled 2022-05-26: qty 1

## 2022-05-26 MED ORDER — FREE WATER
200.0000 mL | Status: DC
  Administered 2022-05-26 (×3): 200 mL

## 2022-05-26 MED ORDER — HYDRALAZINE HCL 20 MG/ML IJ SOLN: 10.0000 mg | INTRAMUSCULAR | Status: AC | PRN

## 2022-05-26 MED ORDER — SODIUM CHLORIDE 0.9 % IV SOLN: 1.0000 g | Freq: Three times a day (TID) | INTRAVENOUS | Status: AC

## 2022-05-26 MED ORDER — PREDNISONE 20 MG PO TABS: 20.0000 mg | ORAL_TABLET | Freq: Every day | ORAL | Status: AC

## 2022-05-26 MED ORDER — PREDNISONE 20 MG PO TABS: 20.0000 mg | ORAL_TABLET | Freq: Every day | ORAL | Status: DC

## 2022-05-26 MED ORDER — POLYETHYLENE GLYCOL 3350 17 G PO PACK: 17.0000 g | PACK | Freq: Every day | ORAL | 0 refills | Status: AC | PRN

## 2022-05-26 MED ORDER — THIAMINE HCL 100 MG PO TABS
100.0000 mg | ORAL_TABLET | Freq: Every day | ORAL | Status: DC
  Administered 2022-05-26: 100 mg
  Filled 2022-05-26: qty 1

## 2022-05-26 MED ORDER — POLYETHYLENE GLYCOL 3350 17 G PO PACK: 17.0000 g | PACK | Freq: Every day | ORAL | Status: DC | PRN

## 2022-05-26 MED ORDER — OSMOLITE 1.5 CAL PO LIQD: 1000.0000 mL | ORAL | 0 refills | Status: AC

## 2022-05-26 MED ORDER — GUAIFENESIN 100 MG/5ML PO LIQD
10.0000 mL | Freq: Three times a day (TID) | ORAL | Status: DC
  Administered 2022-05-26 (×2): 10 mL via ORAL
  Filled 2022-05-26 (×2): qty 10

## 2022-05-26 MED ORDER — ORAL CARE MOUTH RINSE: 15.0000 mL | OROMUCOSAL | 0 refills | Status: AC | PRN

## 2022-05-26 MED ORDER — HYDRALAZINE HCL 10 MG PO TABS
10.0000 mg | ORAL_TABLET | Freq: Three times a day (TID) | ORAL | Status: DC
  Administered 2022-05-26: 10 mg via ORAL
  Filled 2022-05-26: qty 1

## 2022-05-26 MED ORDER — SODIUM CHLORIDE 3 % IN NEBU: 4.0000 mL | INHALATION_SOLUTION | Freq: Two times a day (BID) | RESPIRATORY_TRACT | 12 refills | Status: AC

## 2022-05-26 NOTE — Progress Notes (Signed)
NAME:  Sheila Fernandez, MRN:  [id number], DOB:  07-22-1951, LOS: 2 ADMISSION DATE:  05/24/2022, CONSULTATION DATE:  05/24/22 REFERRING MD:  Owens Shark, CHIEF COMPLAINT:  Syncope   History of Present Illness:  71 year old woman with history of sickle cell trait who had long complex hospitalization at [facility] on from June 13 through April 08 2022 for recurrent respiratory failure, pseudomonas PNA ultimately requiring tracheostomy.  She was transferred to [facility] for ongoing ventilator wean.  Her stay there has been complicated by recurrent bradycardic events with syncope.  This has been limiting her ability to be decannulated.  She is sent here for additional cardiology evaluation.  She is currently comfortable on full vent support.   Able to write questions/answers on white board.  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 Off pressors. On trach collar   Objective   Blood pressure (!) 143/82, pulse 89, temperature 97.9 F (36.6 C), temperature source Oral, resp. rate (!) 27, weight 54.6 kg, SpO2 100 %.    FiO2 (%):  [35 %] 35 %   Intake/Output Summary (Last 24 hours) at 05/26/2022 0948 Last data filed at 05/26/2022 0900 Gross per 24 hour  Intake 1424.74 ml  Output 565 ml  Net 859.74 ml    Filed Weights   05/25/22 0500 05/26/22 0500  Weight: 60 kg 54.6 kg    Examination: Gen:      No acute distress, chronically ill-appearing HEENT:  EOMI, sclera anicteric Neck:     No JVD, no thyromegaly, tracheostomy Lungs:    Decreased breath sounds bilateral, no accessory muscle use CV:         Regular rate and rhythm; no murmurs Abd:      PEG tube in place, soft, nontender Ext:    No edema; adequate peripheral perfusion Skin:      Warm and dry; no  rash Neuro: Awake, eyes open, does not follow commands  Labs/imaging reviewed Significant for sodium 149, hemoglobin improved from 7.7-9.2 Echocardiogram shows LVEF 94-85%, grade 1 diastolic dysfunction with normal RV systolic size and function  Chest x-ray 8/25 shows bibasal atelectasis/infiltrates   Resolved Hospital Problem list   N/A  Assessment & Plan:  Recurrent symptomatic bradycardic episodes Continue telemetry monitoring.  Echo is unremarkable Cardiology following.  Coreg and Lopressor held Off Levophed, resume low-dose hydralazine for hypertension. Losartan still on hold  Presumed recurrent HCAP, hx pseudomonas Trach dependence- the bradycardic events above seem to be limiting her ability to wean off vent. Checking tracheal aspirate cultures Antibiotics switched to meropenem Hypertonic saline, chest physiotherapy, bronchial hygiene Routine trach care  Anxiety Klonopin as needed  Anemia No active bleed.  Monitor CBC.  [facility] agreed to [facility] patient [facility] once these bradycardia episodes resolved. Okay to transfer back  Best Practice (right click and "Reselect all SmartList Selections" daily)   Diet/type: tubefeeds DVT prophylaxis: LMWH GI prophylaxis: PPI Lines: N/A Foley:  N/A Code Status:  full code Last date of multidisciplinary goals of care discussion [pending]  Critical care time:  NA    Shandora Koogler V. Elsworth Soho MD  Pulmonary & Critical care See Amion for pager  If no response to pager , please call [phone] until 7pm After 7:00 pm call Elink  [phone] 05/26/2022, 9:48 AM

## 2022-05-26 NOTE — Discharge Summary (Signed)
Physician Discharge Summary   Patient ID: Sheila Fernandez MRN: [id number] DOB/AGE: December 05, 1950 71 y.o.  Admit date: 05/24/2022 Discharge date: 05/26/2022                     Discharge Plan by Diagnosis   Recurrent symptomatic bradycardic episodes - now resolved after AV nodal blockers held. Echo unremarkable. - Continue to hold Carvedilol, Lopressor, Losartan. - Supportive care. - Cardiology follow up.  Presumed recurrent HCAP, hx pseudomonas. Trach dependence - the bradycardic events above have apparently been limiting her ability to wean off vent. - Continue Meropenem. - Follow cultures. - Continue Chest PT. - Routine trach care. - CXR intermittently.  Anxiety. - Klonopin as needed.   Anemia - no evidence of bleeding. -Follow H/H and monitor for signs of bleeding.    Stable for transfer back to [facility].    Discharge Summary  Sheila Fernandez is a 71 y.o. y/o female with a PMH of sickle cell trait who had a long complex hospitalization at [facility] from June 13 through April 08, 2022 for recurrent respiratory failure secondary to pseudomonas PNA and ultimately requiring tracheostomy. She was then discharged to [facility] for ongoing ventilator wean. Her stay there had been complicated by recurrent bradycardic events with syncope. This had limited her ability to be decannulated. She was subsequently sent to [facility] ED 8/26 for further cardiology evaluation.  She was evaluated by cardiology. Echo was obtained and demonstrated borderline LVH with LVEF 70-75% and G1DD. Carvedilol and Metoprolol were held.  Bradycardia resolved and she remained in NSR.  On 8/28, she was deemed medically stable and was cleared for discharge back to [facility]   8/26 transferred from [facility] to [facility] ED. 8/28 discharged back to [facility].   Significant Diagnostic Studies  Echo 8/26 > EF 70-75%, G1DD.  Antimicrobials  Meropenem 8/26 >  Consults   Cardiology.  Objective:  Blood pressure (!) 150/74, pulse (!) 106, temperature 97.9 F (36.6 C), temperature source Oral, resp. rate (!) 32, weight 54.6 kg, SpO2 100 %.    FiO2 (%):  [35 %] 35 %   Intake/Output Summary (Last 24 hours) at 05/26/2022 1129 Last data filed at 05/26/2022 1100 Gross per 24 hour  Intake 1379.74 ml  Output 945 ml  Net 434.74 ml   Filed Weights   05/25/22 0500 05/26/22 0500  Weight: 60 kg 54.6 kg    Physical Examination: General: Adult female, resting in bed, in NAD. Neuro: Awake, eyes open, not following commands. HEENT: [place]/AT. EOMI, sclerae anicteric. Trach C/D/I. Cardiovascular: RRR, no M/R/G.  Lungs: Respirations even and unlabored.  CTA bilaterally, No W/R/R. Abdomen: BS x 4, soft, NT/ND.  Musculoskeletal: No gross deformities, no edema.  Skin: Intact, warm, no rashes.   Discharge Labs:  BMET Recent Labs  Lab 05/23/22 1145 05/24/22 0320 05/24/22 1837 05/25/22 0526 05/26/22 0600  NA 145 144 145 147* 149*  K 4.6 4.5 4.4 4.2 4.4  CL 106 106 107 108 109  CO2 33* 33* 31 34* 36*  GLUCOSE 245* 159* 221* 152* 136*  BUN 49* 63* 39* 40* 21  CREATININE 0.83 0.98 0.68 0.64 0.52  CALCIUM 9.4 9.4 9.2 9.1 9.2  MG 2.1 2.1  --  2.2 2.1    CBC Recent Labs  Lab 05/24/22 1837 05/25/22 0526 05/26/22 0600  HGB 8.3* 7.7* 9.2*  HCT 26.4* 24.6* 30.0*  WBC 9.8 9.7 10.0  PLT 230 203 221    Anti-Coagulation No results for input(s): "INR" in the last 168 hours.        Allergies as of 05/26/2022       Reactions   Ativan [lorazepam] Other (See Comments)   Severe lethargy and unable to remember events    Codeine Nausea And Vomiting        Medication List     STOP taking these medications    acetaminophen 325 MG tablet Commonly known as: TYLENOL Replaced by: acetaminophen 160 MG/5ML solution   albuterol (2.5 MG/3ML) 0.083% nebulizer solution Commonly known as: PROVENTIL   amLODipine 5 MG tablet Commonly known as: NORVASC    bisacodyl 10 MG suppository Commonly known as: DULCOLAX   enoxaparin 40 MG/0.4ML injection Commonly known as: LOVENOX   feeding supplement (PRO-STAT 64) Liqd   ferrous sulfate 300 (60 Fe) MG/5ML syrup   hydrALAZINE 10 MG tablet Commonly known as: APRESOLINE Replaced by: hydrALAZINE 20 MG/ML injection   isosorbide dinitrate 20 MG tablet Commonly known as: ISORDIL   lip balm ointment   losartan 50 MG tablet Commonly known as: COZAAR   melatonin 3 MG Tabs tablet   multivitamin with minerals Tabs tablet   ondansetron 4 MG/2ML Soln injection Commonly known as: ZOFRAN   pantoprazole 40 MG tablet Commonly known as: PROTONIX Replaced by: pantoprazole sodium 40 mg   phenol 1.4 % Liqd Commonly known as: CHLORASEPTIC   QUEtiapine 50 MG tablet Commonly known as: SEROQUEL   sodium polystyrene 15 GM/60ML suspension Commonly known as: KAYEXALATE   TAB-A-VITE/BETA CAROTENE PO   thiamine 100 MG tablet Commonly known as: VITAMIN B1   tobramycin (PF) 300 MG/5ML nebulizer solution Commonly known as: TOBI       TAKE these medications    acetaminophen 160 MG/5ML solution Commonly known as: TYLENOL Place 20.3 mLs (650 mg total) into feeding tube every 6 (six) hours as needed for moderate pain, mild pain or fever. Replaces: acetaminophen 325 MG tablet   aspirin 81 MG chewable tablet Place 1 tablet (81 mg total) into feeding tube daily. Start taking on: May 27, 2022 What changed: how to take this   Chlorhexidine Gluconate Cloth 2 % Pads Apply 6 each topically daily.   clonazePAM 0.5 MG tablet Commonly known as: KLONOPIN Place 1 tablet (0.5 mg total) into feeding tube every 6 (six) hours as needed (anxiety). What changed:  how to take this reasons to take this   docusate 50 MG/5ML liquid Commonly known as: COLACE Place 10 mLs (100 mg total) into feeding tube 2 (two) times daily as needed for mild constipation. What changed: Another medication with the same  name was removed. Continue taking this medication, and follow the directions you see here.   feeding supplement (OSMOLITE 1.5 CAL) Liqd Place 1,000 mLs into feeding tube continuous.   free water Soln Place 200 mLs into feeding tube every 2 (two) hours.   guaiFENesin 100 MG/5ML liquid Commonly known as: ROBITUSSIN Take 10 mLs by mouth every 8 (eight) hours. What changed:  how much to take how to take this when to take this Another medication with the same name was removed. Continue taking this medication, and follow the directions you see here.   hydrALAZINE 20 MG/ML injection Commonly known as: APRESOLINE Inject 0.5 mLs (10 mg total) into the vein every 4 (four) hours as needed (for BP over 160/100). Replaces: hydrALAZINE 10 MG tablet   ipratropium-albuterol 0.5-2.5 (3) MG/3ML Soln Commonly known as: DUONEB Take 3  mLs by nebulization every 6 (six) hours as needed. What changed:  how to take this when to take this reasons to take this   meropenem 1 g in sodium chloride 0.9 % 100 mL Inject 1 g into the vein every 8 (eight) hours.   mouth rinse Liqd solution 15 mLs by Mouth Rinse route every 2 (two) hours.   mouth rinse Liqd solution 15 mLs by Mouth Rinse route as needed (oral care).   pantoprazole sodium 40 mg Commonly known as: PROTONIX Place 40 mg into feeding tube daily. Replaces: pantoprazole 40 MG tablet   polyethylene glycol 17 g packet Commonly known as: MIRALAX / GLYCOLAX Place 17 g into feeding tube daily as needed for moderate constipation. What changed:  when to take this reasons to take this   predniSONE 20 MG tablet Commonly known as: DELTASONE Place 1 tablet (20 mg total) into feeding tube daily with breakfast. Start taking on: May 27, 2022 What changed: how to take this   sodium chloride HYPERTONIC 3 % nebulizer solution Take 4 mLs by nebulization 2 (two) times daily.         Disposition: [facility]   Discharge  Condition:  Irene L Cabral has met maximum benefit of inpatient care and is medically stable and cleared for discharge.  Patient is pending follow up as above.     Time spent on discharge: Greater than 30 minutes.    Montey Hora, [facility] For pager details, please see AMION or use Epic chat  After 1900, please call [place] for cross coverage needs 05/26/2022, 11:29 AM

## 2022-05-26 NOTE — TOC Transition Note (Signed)
Transition of Care (TOC) - CM/SW Discharge Note Marvetta Gibbons RN, BSN Transitions of Care Unit 4E- RN Case Manager See Treatment Team for direct phone #  2H cross coverage  Patient Details  Name: Sheila Fernandez MRN: [id number] Date of Birth: 11-06-1950  Transition of Care [facility]) CM/SW Contact:  Dawayne Patricia, RN Phone Number: 05/26/2022, 11:33 AM   Clinical Narrative:    Pt stable to transition back to [facility]. CM has spoken with [facility] liaison- Anderson Malta who confirms that [facility] can accept pt back today with bed available.   Team has been updated and plan will be for pt to return to [facility] today.   RN can call report to [phone]. Pt to go to Room 5E22 Receiving doc [place] will sign off for now as intervention is no longer needed. Please re-consult  if new needs arise, or contact RNCM assigned to treatment team for further questions/concerns.      Final next level of care: Long Term Acute Care (LTAC) Barriers to Discharge: No Barriers Identified   Patient Goals and CMS Choice Patient states their goals for this hospitalization and ongoing recovery are:: Bed available to return to [facility]   Choice offered to / list presented to : Adult Children  Discharge Placement               [facility]- [facility]        Discharge Plan and Services In-house Referral: NA Discharge Planning Services: CM Consult Post Acute Care Choice: Long Term Acute Care (LTAC)          DME Arranged: N/A DME Agency: NA       HH Arranged: NA HH Agency: NA        Social Determinants of Health (SDOH) Interventions     Readmission Risk Interventions     No data to display

## 2022-05-26 NOTE — Consult Note (Signed)
Ref: Margretta Sidle, MD   Subjective:  Awake. Respiratory distress continues. Monitor: Sinus rhythm. O2 sat 100 % on 35 % FiO2. Protein electrophoresis is in process and urine electrophoresis is pending. No sinus pauses.  Objective:  Vital Signs in the last 24 hours: Temp:  [97.5 F (36.4 C)-98.2 F (36.8 C)] 97.9 F (36.6 C) (08/28 0800) Pulse Rate:  [72-122] 89 (08/28 0900) Cardiac Rhythm: Normal sinus rhythm (08/27 2000) Resp:  [15-32] 27 (08/28 0900) BP: (109-149)/(68-118) 143/82 (08/28 0900) SpO2:  [96 %-100 %] 100 % (08/28 0900) FiO2 (%):  [35 %] 35 % (08/28 0747) Weight:  [54.6 kg] 54.6 kg (08/28 0500)  Physical Exam: BP Readings from Last 1 Encounters:  05/26/22 (!) 143/82     Wt Readings from Last 1 Encounters:  05/26/22 54.6 kg    Weight change: -5.4 kg Body mass index is 22.74 kg/m. HEENT: [place]/AT, Eyes-Brown, Conjunctiva-Pale, Sclera-Non-icteric Neck: No JVD, No bruit, Tracheostomy in place. Lungs:  Clearing, Bilateral. Cardiac:  Regular rhythm, normal S1 and S2, no S3. II/VI systolic murmur. Abdomen:  Soft, non-tender. BS present. Extremities:  No edema present. No cyanosis. No clubbing. CNS: AxOx3, Cranial nerves grossly intact, moves all 4 extremities.  Skin: Warm and dry.   Intake/Output from previous day: 08/27 0701 - 08/28 0700 In: 1369.7 [NG/GT:1170; IV Piggyback:199.7] Out: 565 [Urine:315]    Lab Results: BMET    Component Value Date/Time   NA 149 (H) 05/26/2022 0600   NA 147 (H) 05/25/2022 0526   NA 145 05/24/2022 1837   NA 138 06/10/2017 1238   K 4.4 05/26/2022 0600   K 4.2 05/25/2022 0526   K 4.4 05/24/2022 1837   K 4.0 06/10/2017 1238   CL 109 05/26/2022 0600   CL 108 05/25/2022 0526   CL 107 05/24/2022 1837   CO2 36 (H) 05/26/2022 0600   CO2 34 (H) 05/25/2022 0526   CO2 31 05/24/2022 1837   CO2 23 06/10/2017 1238   GLUCOSE 136 (H) 05/26/2022 0600   GLUCOSE 152 (H) 05/25/2022 0526   GLUCOSE 221 (H) 05/24/2022 1837   GLUCOSE  93 06/10/2017 1238   BUN 21 05/26/2022 0600   BUN 40 (H) 05/25/2022 0526   BUN 39 (H) 05/24/2022 1837   BUN 18.5 06/10/2017 1238   CREATININE 0.52 05/26/2022 0600   CREATININE 0.64 05/25/2022 0526   CREATININE 0.68 05/24/2022 1837   CREATININE 0.8 06/10/2017 1238   CREATININE 0.71 03/06/2015 0826   CREATININE 0.65 02/03/2011 1255   CALCIUM 9.2 05/26/2022 0600   CALCIUM 9.1 05/25/2022 0526   CALCIUM 9.2 05/24/2022 1837   CALCIUM 9.8 06/10/2017 1238   GFRNONAA >60 05/26/2022 0600   GFRNONAA >60 05/25/2022 0526   GFRNONAA >60 05/24/2022 1837   GFRNONAA >60 02/03/2011 1255   GFRAA >90 09/06/2013 0225   GFRAA >90 09/05/2013 1859   GFRAA >60 02/03/2011 1255   CBC    Component Value Date/Time   WBC 10.0 05/26/2022 0600   RBC 3.24 (L) 05/26/2022 0600   HGB 9.2 (L) 05/26/2022 0600   HGB 12.1 06/10/2017 1238   HCT 30.0 (L) 05/26/2022 0600   HCT 36.7 06/10/2017 1238   PLT 221 05/26/2022 0600   PLT 227 06/10/2017 1238   MCV 92.6 05/26/2022 0600   MCV 82.7 06/10/2017 1238   MCH 28.4 05/26/2022 0600   MCHC 30.7 05/26/2022 0600   RDW 18.0 (H) 05/26/2022 0600   RDW 15.8 (H) 06/10/2017 1238   LYMPHSABS 1.7 04/08/2022 0336   LYMPHSABS  2.3 06/10/2017 1238   MONOABS 0.6 04/08/2022 0336   MONOABS 0.2 06/10/2017 1238   EOSABS 0.3 04/08/2022 0336   EOSABS 0.2 06/10/2017 1238   BASOSABS 0.1 04/08/2022 0336   BASOSABS 0.0 06/10/2017 1238   HEPATIC Function Panel Recent Labs    05/04/22 0143 05/05/22 1105 05/08/22 0518  PROT 7.0 6.8 6.4*  ALBUMIN 3.4* 3.4* 3.0*  AST 115* 34 18  ALT 154* 88* 42  ALKPHOS 78 76 74   HEMOGLOBIN A1C Lab Results  Component Value Date   MPG 111 09/06/2013   CARDIAC ENZYMES Lab Results  Component Value Date   CKTOTAL 19 (L) 05/24/2022   CKMB 1.0 05/24/2022   TROPONINI <0.30 09/06/2013   TROPONINI <0.30 09/06/2013   TROPONINI <0.30 09/06/2013   BNP No results for input(s): "PROBNP" in the last 8760 hours. TSH Recent Labs    03/29/22 0638  04/09/22 0555 04/18/22 0643  TSH 1.709 0.802 1.597   CHOLESTEROL No results for input(s): "CHOL" in the last 8760 hours.  Scheduled Meds:  aspirin  81 mg Per Tube Daily   Chlorhexidine Gluconate Cloth  6 each Topical Daily   enoxaparin (LOVENOX) injection  40 mg Subcutaneous Q24H   free water  30 mL Per Tube Q2H   guaiFENesin  10 mL Oral Q8H   melatonin  3 mg Oral QHS   mouth rinse  15 mL Mouth Rinse Q2H   pantoprazole  40 mg Per Tube Daily   [START ON 05/27/2022] predniSONE  20 mg Per Tube Q breakfast   sodium chloride HYPERTONIC  4 mL Nebulization BID   thiamine  100 mg Per Tube Daily   Continuous Infusions:  feeding supplement (OSMOLITE 1.5 CAL) 45 mL/hr at 05/26/22 0900   meropenem (MERREM) IV Stopped (05/26/22 0659)   PRN Meds:.acetaminophen (TYLENOL) oral liquid 160 mg/5 mL, bisacodyl, clonazePAM, docusate, hydrALAZINE, ipratropium-albuterol, mouth rinse, polyethylene glycol  Assessment/Plan:  Acute on chronic respiratory failure with hypoxia Sinus bradycardia, resolved HTN Bilateral pneumonia 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   LOS: 2 days   Time spent including chart review, lab review, examination, discussion with patient/Nurse : 30 min   Dixie Dials  MD  05/26/2022, 9:26 AM

## 2022-05-27 ENCOUNTER — Other Ambulatory Visit (HOSPITAL_COMMUNITY): Payer: Self-pay

## 2022-05-27 DIAGNOSIS — D573 Sickle-cell trait: Secondary | ICD-10-CM

## 2022-05-27 DIAGNOSIS — J151 Pneumonia due to Pseudomonas: Secondary | ICD-10-CM

## 2022-05-27 DIAGNOSIS — Z930 Tracheostomy status: Secondary | ICD-10-CM

## 2022-05-27 DIAGNOSIS — J9621 Acute and chronic respiratory failure with hypoxia: Secondary | ICD-10-CM

## 2022-05-27 DIAGNOSIS — J90 Pleural effusion, not elsewhere classified: Secondary | ICD-10-CM

## 2022-05-27 LAB — BLOOD GAS, ARTERIAL
Acid-Base Excess: 13.8 mmol/L — ABNORMAL HIGH (ref 0.0–2.0)
Bicarbonate: 40 mmol/L — ABNORMAL HIGH (ref 20.0–28.0)
O2 Saturation: 100 %
Patient temperature: 37
pCO2 arterial: 55 mmHg — ABNORMAL HIGH (ref 32–48)
pH, Arterial: 7.47 — ABNORMAL HIGH (ref 7.35–7.45)
pO2, Arterial: 88 mmHg (ref 83–108)

## 2022-05-27 LAB — PROTEIN ELECTROPHORESIS, SERUM
A/G Ratio: 1.1 (ref 0.7–1.7)
Albumin ELP: 2.7 g/dL — ABNORMAL LOW (ref 2.9–4.4)
Alpha-1-Globulin: 0.2 g/dL (ref 0.0–0.4)
Alpha-2-Globulin: 0.6 g/dL (ref 0.4–1.0)
Beta Globulin: 0.7 g/dL (ref 0.7–1.3)
Gamma Globulin: 1 g/dL (ref 0.4–1.8)
Globulin, Total: 2.5 g/dL (ref 2.2–3.9)
Total Protein ELP: 5.2 g/dL — ABNORMAL LOW (ref 6.0–8.5)

## 2022-05-27 NOTE — Consult Note (Signed)
Ref: Margretta Sidle, MD Ref: Dr. Saul Fordyce  Subjective:  Awake. HR-90's. BP: 160/99, O2 sat 100 % on FiO2 28 % by trach collar.  Objective:  Vital Signs in the last 24 hours: Temp:  [97.9 F (36.6 C)] 97.9 F (36.6 C) (08/28 1100) Pulse Rate:  [89-117] 103 (08/28 1500) Resp:  [19-34] 28 (08/28 1500) BP: (138-156)/(74-94) 156/91 (08/28 1500) SpO2:  [99 %-100 %] 100 % (08/28 1500) FiO2 (%):  [35 %] 35 % (08/28 1150)  Physical Exam: BP Readings from Last 1 Encounters:  05/26/22 (!) 156/91     Wt Readings from Last 1 Encounters:  05/26/22 54.6 kg    Weight change:  There is no height or weight on file to calculate BMI. HEENT: [place]/AT, Eyes-Brown, Conjunctiva-Pale, Sclera-Non-icteric Neck: No JVD, No bruit, Trachea midline. Lungs:  Clearing, Bilateral. Cardiac:  Regular rhythm, normal S1 and S2, no S3. II/VI systolic murmur. Abdomen:  Soft, non-tender. BS present. Extremities:  No edema present. No cyanosis. No clubbing. CNS: AxOx3, Cranial nerves grossly intact, moves all 4 extremities.  Skin: Warm and dry.   Intake/Output from previous day: No intake/output data recorded.    Lab Results: BMET    Component Value Date/Time   NA 149 (H) 05/26/2022 0600   NA 147 (H) 05/25/2022 0526   NA 145 05/24/2022 1837   NA 138 06/10/2017 1238   K 4.4 05/26/2022 0600   K 4.2 05/25/2022 0526   K 4.4 05/24/2022 1837   K 4.0 06/10/2017 1238   CL 109 05/26/2022 0600   CL 108 05/25/2022 0526   CL 107 05/24/2022 1837   CO2 36 (H) 05/26/2022 0600   CO2 34 (H) 05/25/2022 0526   CO2 31 05/24/2022 1837   CO2 23 06/10/2017 1238   GLUCOSE 136 (H) 05/26/2022 0600   GLUCOSE 152 (H) 05/25/2022 0526   GLUCOSE 221 (H) 05/24/2022 1837   GLUCOSE 93 06/10/2017 1238   BUN 21 05/26/2022 0600   BUN 40 (H) 05/25/2022 0526   BUN 39 (H) 05/24/2022 1837   BUN 18.5 06/10/2017 1238   CREATININE 0.52 05/26/2022 0600   CREATININE 0.64 05/25/2022 0526   CREATININE 0.68 05/24/2022 1837   CREATININE 0.8  06/10/2017 1238   CREATININE 0.71 03/06/2015 0826   CREATININE 0.65 02/03/2011 1255   CALCIUM 9.2 05/26/2022 0600   CALCIUM 9.1 05/25/2022 0526   CALCIUM 9.2 05/24/2022 1837   CALCIUM 9.8 06/10/2017 1238   GFRNONAA >60 05/26/2022 0600   GFRNONAA >60 05/25/2022 0526   GFRNONAA >60 05/24/2022 1837   GFRNONAA >60 02/03/2011 1255   GFRAA >90 09/06/2013 0225   GFRAA >90 09/05/2013 1859   GFRAA >60 02/03/2011 1255   CBC    Component Value Date/Time   WBC 10.0 05/26/2022 0600   RBC 3.24 (L) 05/26/2022 0600   HGB 9.2 (L) 05/26/2022 0600   HGB 12.1 06/10/2017 1238   HCT 30.0 (L) 05/26/2022 0600   HCT 36.7 06/10/2017 1238   PLT 221 05/26/2022 0600   PLT 227 06/10/2017 1238   MCV 92.6 05/26/2022 0600   MCV 82.7 06/10/2017 1238   MCH 28.4 05/26/2022 0600   MCHC 30.7 05/26/2022 0600   RDW 18.0 (H) 05/26/2022 0600   RDW 15.8 (H) 06/10/2017 1238   LYMPHSABS 1.7 04/08/2022 0336   LYMPHSABS 2.3 06/10/2017 1238   MONOABS 0.6 04/08/2022 0336   MONOABS 0.2 06/10/2017 1238   EOSABS 0.3 04/08/2022 0336   EOSABS 0.2 06/10/2017 1238   BASOSABS 0.1 04/08/2022 0336  BASOSABS 0.0 06/10/2017 1238   HEPATIC Function Panel Recent Labs    05/04/22 0143 05/05/22 1105 05/08/22 0518  PROT 7.0 6.8 6.4*  ALBUMIN 3.4* 3.4* 3.0*  AST 115* 34 18  ALT 154* 88* 42  ALKPHOS 78 76 74   HEMOGLOBIN A1C Lab Results  Component Value Date   MPG 111 09/06/2013   CARDIAC ENZYMES Lab Results  Component Value Date   CKTOTAL 19 (L) 05/24/2022   CKMB 1.0 05/24/2022   TROPONINI <0.30 09/06/2013   TROPONINI <0.30 09/06/2013   TROPONINI <0.30 09/06/2013   BNP No results for input(s): "PROBNP" in the last 8760 hours. TSH Recent Labs    03/29/22 0638 04/09/22 0555 04/18/22 0643  TSH 1.709 0.802 1.597   CHOLESTEROL No results for input(s): "CHOL" in the last 8760 hours.  Scheduled Meds: Continuous Infusions: PRN Meds:.  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 Daily. Protein electrophoresis in process.   LOS: 0 days   Time spent including chart review, lab review, examination, discussion with patient/Nurse : 30 min   Dixie Dials  MD  05/27/2022, 8:48 AM

## 2022-05-27 NOTE — Progress Notes (Cosign Needed)
Pulmonary [place]   PULMONARY CRITICAL CARE SERVICE  PROGRESS NOTE     Sheila Fernandez  VPX:[id number]  DOB: October 08, 1950   DOA: 05/26/2022  Referring Physician: Satira Sark, MD  HPI: Sheila Fernandez is a 71 y.o. female being followed for ventilator/airway/oxygen weaning Acute on Chronic Respiratory Failure.  Patient seen lying in bed patient seen lying in bed, recently came back up from acute hospital.  She was sent back up on ATC 40% but we have titrated her down to 28%. Given her well-documented history of bradycardia and rapid response around day 3 or 4 of ATC, we have discussed with internal medicine that she may benefit from remaining on the ventilator until she becomes cardiac stable. We'll go ahead and get an ABG this morning and see where she is at. Her issue does appear  to be all cards related but we dont want to further stress her out if that is the case.  Medications: Reviewed on Rounds  Physical Exam:  Vitals: Temp 97.0, pulse 68, respirations 24, BP 121/74, Sp02 100%  Ventilator Settings ATC 28%  General: Comfortable at this time Neck: supple Cardiovascular: no malignant arrhythmias Respiratory: bilaterally diminished Skin: no rash seen on limited exam Musculoskeletal: No gross abnormality Psychiatric:unable to assess Neurologic:no involuntary movements         Lab Data:   Basic Metabolic Panel: Recent Labs  Lab 05/23/22 1145 05/24/22 0320 05/24/22 1837 05/25/22 0526 05/26/22 0600  NA 145 144 145 147* 149*  K 4.6 4.5 4.4 4.2 4.4  CL 106 106 107 108 109  CO2 33* 33* 31 34* 36*  GLUCOSE 245* 159* 221* 152* 136*  BUN 49* 63* 39* 40* 21  CREATININE 0.83 0.98 0.68 0.64 0.52  CALCIUM 9.4 9.4 9.2 9.1 9.2  MG 2.1 2.1  --  2.2 2.1    ABG: Recent Labs  Lab 05/27/22 1105  PHART 7.47*  PCO2ART 55*  PO2ART 88  HCO3 40.0*  O2SAT 100    Liver Function Tests: No results for input(s): "AST",  "ALT", "ALKPHOS", "BILITOT", "PROT", "ALBUMIN" in the last 168 hours. No results for input(s): "LIPASE", "AMYLASE" in the last 168 hours. No results for input(s): "AMMONIA" in the last 168 hours.  CBC: Recent Labs  Lab 05/24/22 0320 05/24/22 1837 05/25/22 0526 05/26/22 0600  WBC 13.4* 9.8 9.7 10.0  HGB 8.1* 8.3* 7.7* 9.2*  HCT 26.0* 26.4* 24.6* 30.0*  MCV 90.3 90.1 91.1 92.6  PLT 244 230 203 221    Cardiac Enzymes: Recent Labs  Lab 05/23/22 1145 05/23/22 1859 05/24/22 0320  CKTOTAL 19* 16* 19*  CKMB 1.6 1.4 1.0    BNP (last 3 results) Recent Labs    04/08/22 0336  BNP 51.6    ProBNP (last 3 results) No results for input(s): "PROBNP" in the last 8760 hours.  Radiological Exams: DG CHEST PORT 1 VIEW  Result Date: 05/27/2022 CLINICAL DATA:  Pneumonia. EXAM: PORTABLE CHEST 1 VIEW COMPARISON:  01/21/2022 FINDINGS: Tracheostomy tube tip is stable above the carina. Low lung volumes. Persistent bilateral lower lobe opacities compatible with atelectasis and or consolidation. Unchanged from previous exam. IMPRESSION: Low lung volumes with persistent bilateral lower lobe opacities compatible with atelectasis and/or consolidation. Electronically Signed   By: Kerby Moors M.D.   On: 05/27/2022 06:05    Assessment/Plan Active Problems:   Sickle cell trait (HCC)   Acute on chronic respiratory failure with hypoxia (HCC)   Pneumonia of right lung due  to Pseudomonas species ([place])   Pleural effusion, right   Tracheostomy dependence (HCC)   Acute on chronic respiratory failure hypoxia-Currently on ATC 28%. Pending ABG. We'll discuss with IM about where we want to start with weaning trials as talkd about above. Continue with supportive care. Sickle cell trait -not active we will continue to monitor closely/ Pleural effusion -last chest film free of pleural effusion.   Tracheostomy-remains in place.  No plan for decannulation this admission.  Continue with trach care per  protocol. Healthcare associated pneumonia -has been treated and is improving clinically.   I have personally seen and evaluated the patient, evaluated laboratory and imaging results, formulated the assessment and plan and placed orders. The Patient requires high complexity decision making with multiple systems involvement.  Rounds were done with the Respiratory Therapy Director and Staff therapists and discussed with nursing staff also.  Allyne Gee, MD [facility] Pulmonary Critical Care Medicine Sleep Medicine

## 2022-05-28 LAB — BASIC METABOLIC PANEL
Anion gap: 10 (ref 5–15)
BUN: 27 mg/dL — ABNORMAL HIGH (ref 8–23)
CO2: 31 mmol/L (ref 22–32)
Calcium: 9.2 mg/dL (ref 8.9–10.3)
Chloride: 96 mmol/L — ABNORMAL LOW (ref 98–111)
Creatinine, Ser: 0.74 mg/dL (ref 0.44–1.00)
GFR, Estimated: >60 mL/min (ref >60)
Glucose, Bld: 157 mg/dL — ABNORMAL HIGH (ref 70–99)
Potassium: 4.3 mmol/L (ref 3.5–5.1)
Sodium: 137 mmol/L (ref 135–145)

## 2022-05-28 LAB — CBC
HCT: 24.9 % — ABNORMAL LOW (ref 36.0–46.0)
Hemoglobin: 8 g/dL — ABNORMAL LOW (ref 12.0–15.0)
MCH: 28.8 pg (ref 26.0–34.0)
MCHC: 32.1 g/dL (ref 30.0–36.0)
MCV: 89.6 fL (ref 80.0–100.0)
Platelets: 186 10*3/uL (ref 150–400)
RBC: 2.78 MIL/uL — ABNORMAL LOW (ref 3.87–5.11)
RDW: 17.6 % — ABNORMAL HIGH (ref 11.5–15.5)
WBC: 8.9 10*3/uL (ref 4.0–10.5)
nRBC: 0 % (ref 0.0–0.2)

## 2022-05-28 LAB — MAGNESIUM: Magnesium: 1.7 mg/dL (ref 1.7–2.4)

## 2022-05-28 NOTE — Consult Note (Signed)
Ref: Margretta Sidle, MD   Subjective:  Awake. VS stable. Protein electrophoresis is negative for M spike and positive for hypoalbuminemia. 24 hour urine collection attempts failed.  Objective:  Vital Signs in the last 24 hours:  P:92, R: 24, BP: 104/69, O2 sat 99% with 28 % FiO2 by trach collar.  Physical Exam: BP Readings from Last 1 Encounters:  05/26/22 (!) 156/91     Wt Readings from Last 1 Encounters:  05/26/22 54.6 kg    Weight change:  There is no height or weight on file to calculate BMI. HEENT: [place]/AT, Eyes-Brown, Conjunctiva-Pale, Sclera-Non-icteric Neck: No JVD, No bruit, Trachea midline. Lungs:  Clearing, Bilateral. Cardiac:  Regular rhythm, normal S1 and S2, no S3. II/VI systolic murmur. Abdomen:  Soft, non-tender. BS present. Extremities:  No edema present. No cyanosis. No clubbing. CNS: AxOx3, Cranial nerves grossly intact, moves all 4 extremities.  Skin: Warm and dry.   Intake/Output from previous day: No intake/output data recorded.    Lab Results: BMET    Component Value Date/Time   NA 137 05/28/2022 0744   NA 149 (H) 05/26/2022 0600   NA 147 (H) 05/25/2022 0526   NA 138 06/10/2017 1238   K 4.3 05/28/2022 0744   K 4.4 05/26/2022 0600   K 4.2 05/25/2022 0526   K 4.0 06/10/2017 1238   CL 96 (L) 05/28/2022 0744   CL 109 05/26/2022 0600   CL 108 05/25/2022 0526   CO2 31 05/28/2022 0744   CO2 36 (H) 05/26/2022 0600   CO2 34 (H) 05/25/2022 0526   CO2 23 06/10/2017 1238   GLUCOSE 157 (H) 05/28/2022 0744   GLUCOSE 136 (H) 05/26/2022 0600   GLUCOSE 152 (H) 05/25/2022 0526   GLUCOSE 93 06/10/2017 1238   BUN 27 (H) 05/28/2022 0744   BUN 21 05/26/2022 0600   BUN 40 (H) 05/25/2022 0526   BUN 18.5 06/10/2017 1238   CREATININE 0.74 05/28/2022 0744   CREATININE 0.52 05/26/2022 0600   CREATININE 0.64 05/25/2022 0526   CREATININE 0.8 06/10/2017 1238   CREATININE 0.71 03/06/2015 0826   CREATININE 0.65 02/03/2011 1255   CALCIUM 9.2 05/28/2022 0744    CALCIUM 9.2 05/26/2022 0600   CALCIUM 9.1 05/25/2022 0526   CALCIUM 9.8 06/10/2017 1238   GFRNONAA >60 05/28/2022 0744   GFRNONAA >60 05/26/2022 0600   GFRNONAA >60 05/25/2022 0526   GFRNONAA >60 02/03/2011 1255   GFRAA >90 09/06/2013 0225   GFRAA >90 09/05/2013 1859   GFRAA >60 02/03/2011 1255   CBC    Component Value Date/Time   WBC 8.9 05/28/2022 0744   RBC 2.78 (L) 05/28/2022 0744   HGB 8.0 (L) 05/28/2022 0744   HGB 12.1 06/10/2017 1238   HCT 24.9 (L) 05/28/2022 0744   HCT 36.7 06/10/2017 1238   PLT 186 05/28/2022 0744   PLT 227 06/10/2017 1238   MCV 89.6 05/28/2022 0744   MCV 82.7 06/10/2017 1238   MCH 28.8 05/28/2022 0744   MCHC 32.1 05/28/2022 0744   RDW 17.6 (H) 05/28/2022 0744   RDW 15.8 (H) 06/10/2017 1238   LYMPHSABS 1.7 04/08/2022 0336   LYMPHSABS 2.3 06/10/2017 1238   MONOABS 0.6 04/08/2022 0336   MONOABS 0.2 06/10/2017 1238   EOSABS 0.3 04/08/2022 0336   EOSABS 0.2 06/10/2017 1238   BASOSABS 0.1 04/08/2022 0336   BASOSABS 0.0 06/10/2017 1238   HEPATIC Function Panel Recent Labs    05/04/22 0143 05/05/22 1105 05/08/22 0518  PROT 7.0 6.8 6.4*  ALBUMIN 3.4*  3.4* 3.0*  AST 115* 34 18  ALT 154* 88* 42  ALKPHOS 78 76 74   HEMOGLOBIN A1C Lab Results  Component Value Date   MPG 111 09/06/2013   CARDIAC ENZYMES Lab Results  Component Value Date   CKTOTAL 19 (L) 05/24/2022   CKMB 1.0 05/24/2022   TROPONINI <0.30 09/06/2013   TROPONINI <0.30 09/06/2013   TROPONINI <0.30 09/06/2013   BNP No results for input(s): "PROBNP" in the last 8760 hours. TSH Recent Labs    03/29/22 0638 04/09/22 0555 04/18/22 0643  TSH 1.709 0.802 1.597   CHOLESTEROL No results for input(s): "CHOL" in the last 8760 hours.  Scheduled Meds: Continuous Infusions: PRN Meds:.  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 Continue medical treatment   LOS: 0 days   Time spent including chart review, lab review, examination, discussion with patient/Nurse : 30 min   Dixie Dials  MD  05/28/2022, 8:25 PM

## 2022-05-28 NOTE — Progress Notes (Signed)
Pulmonary [place]   PULMONARY CRITICAL CARE SERVICE  PROGRESS NOTE     Sheila Fernandez  FUX:[id number]  DOB: 01-04-1951   DOA: 05/26/2022  Referring Physician: Satira Sark, MD  HPI: Sheila Fernandez is a 71 y.o. female being followed for ventilator/airway/oxygen weaning Acute on Chronic Respiratory Failure. ***  Medications: Reviewed on Rounds  Physical Exam:  Vitals: ***  Ventilator Settings ***  General: Comfortable at this time Neck: supple Cardiovascular: no malignant arrhythmias Respiratory: *** Skin: no rash seen on limited exam Musculoskeletal: No gross abnormality Psychiatric:unable to assess Neurologic:no involuntary movements         Lab Data:   Basic Metabolic Panel: Recent Labs  Lab 05/23/22 1145 05/24/22 0320 05/24/22 1837 05/25/22 0526 05/26/22 0600 05/28/22 0744  NA 145 144 145 147* 149* 137  K 4.6 4.5 4.4 4.2 4.4 4.3  CL 106 106 107 108 109 96*  CO2 33* 33* 31 34* 36* 31  GLUCOSE 245* 159* 221* 152* 136* 157*  BUN 49* 63* 39* 40* 21 27*  CREATININE 0.83 0.98 0.68 0.64 0.52 0.74  CALCIUM 9.4 9.4 9.2 9.1 9.2 9.2  MG 2.1 2.1  --  2.2 2.1 1.7    ABG: Recent Labs  Lab 05/27/22 1105  PHART 7.47*  PCO2ART 55*  PO2ART 88  HCO3 40.0*  O2SAT 100    Liver Function Tests: No results for input(s): "AST", "ALT", "ALKPHOS", "BILITOT", "PROT", "ALBUMIN" in the last 168 hours. No results for input(s): "LIPASE", "AMYLASE" in the last 168 hours. No results for input(s): "AMMONIA" in the last 168 hours.  CBC: Recent Labs  Lab 05/24/22 0320 05/24/22 1837 05/25/22 0526 05/26/22 0600 05/28/22 0744  WBC 13.4* 9.8 9.7 10.0 8.9  HGB 8.1* 8.3* 7.7* 9.2* 8.0*  HCT 26.0* 26.4* 24.6* 30.0* 24.9*  MCV 90.3 90.1 91.1 92.6 89.6  PLT 244 230 203 221 186    Cardiac Enzymes: Recent Labs  Lab 05/23/22 1145 05/23/22 1859 05/24/22 0320  CKTOTAL 19* 16* 19*  CKMB 1.6 1.4 1.0    BNP (last  3 results) Recent Labs    04/08/22 0336  BNP 51.6    ProBNP (last 3 results) No results for input(s): "PROBNP" in the last 8760 hours.  Radiological Exams: DG CHEST PORT 1 VIEW  Result Date: 05/27/2022 CLINICAL DATA:  Pneumonia. EXAM: PORTABLE CHEST 1 VIEW COMPARISON:  01/21/2022 FINDINGS: Tracheostomy tube tip is stable above the carina. Low lung volumes. Persistent bilateral lower lobe opacities compatible with atelectasis and or consolidation. Unchanged from previous exam. IMPRESSION: Low lung volumes with persistent bilateral lower lobe opacities compatible with atelectasis and/or consolidation. Electronically Signed   By: Kerby Moors M.D.   On: 05/27/2022 06:05    Assessment/Plan Active Problems:   Sickle cell trait (HCC)   Acute on chronic respiratory failure with hypoxia (HCC)   Pneumonia of right lung due to Pseudomonas species (HCC)   Pleural effusion, right   Tracheostomy dependence (HCC)   *** *** *** *** ***   I have personally seen and evaluated the patient, evaluated laboratory and imaging results, formulated the assessment and plan and placed orders. The Patient requires high complexity decision making with multiple systems involvement.  Rounds were done with the Respiratory Therapy Director and Staff therapists and discussed with nursing staff also.  Allyne Gee, MD [facility] Pulmonary Critical Care Medicine Sleep Medicine

## 2022-05-29 LAB — MAGNESIUM: Magnesium: 2.1 mg/dL (ref 1.7–2.4)

## 2022-05-29 NOTE — Progress Notes (Signed)
Pulmonary [place]   PULMONARY CRITICAL CARE SERVICE  PROGRESS NOTE     Sheila Fernandez  ALP:[id number]  DOB: 09-14-1951   DOA: 05/26/2022  Referring Physician: Satira Sark, MD  HPI: Sheila Fernandez is a 71 y.o. female being followed for ventilator/airway/oxygen weaning Acute on Chronic Respiratory Failure.  Patient currently is on T collar has been on 28% FiO2 goes back on the ventilator at night  Medications: Reviewed on Rounds  Physical Exam:  Vitals: Temperature is 97.0 pulse 70 respiratory rate is 19 blood pressure 107/70 saturations 100%  Ventilator Settings T collar FiO2 is 28%  General: Comfortable at this time Neck: supple Cardiovascular: no malignant arrhythmias Respiratory: Scattered rhonchi expansion is equal Skin: no rash seen on limited exam Musculoskeletal: No gross abnormality Psychiatric:unable to assess Neurologic:no involuntary movements         Lab Data:   Basic Metabolic Panel: Recent Labs  Lab 05/24/22 0320 05/24/22 1837 05/25/22 0526 05/26/22 0600 05/28/22 0744 05/29/22 0345  NA 144 145 147* 149* 137  --   K 4.5 4.4 4.2 4.4 4.3  --   CL 106 107 108 109 96*  --   CO2 33* 31 34* 36* 31  --   GLUCOSE 159* 221* 152* 136* 157*  --   BUN 63* 39* 40* 21 27*  --   CREATININE 0.98 0.68 0.64 0.52 0.74  --   CALCIUM 9.4 9.2 9.1 9.2 9.2  --   MG 2.1  --  2.2 2.1 1.7 2.1    ABG: Recent Labs  Lab 05/27/22 1105  PHART 7.47*  PCO2ART 55*  PO2ART 88  HCO3 40.0*  O2SAT 100    Liver Function Tests: No results for input(s): "AST", "ALT", "ALKPHOS", "BILITOT", "PROT", "ALBUMIN" in the last 168 hours. No results for input(s): "LIPASE", "AMYLASE" in the last 168 hours. No results for input(s): "AMMONIA" in the last 168 hours.  CBC: Recent Labs  Lab 05/24/22 0320 05/24/22 1837 05/25/22 0526 05/26/22 0600 05/28/22 0744  WBC 13.4* 9.8 9.7 10.0 8.9  HGB 8.1* 8.3* 7.7* 9.2* 8.0*  HCT  26.0* 26.4* 24.6* 30.0* 24.9*  MCV 90.3 90.1 91.1 92.6 89.6  PLT 244 230 203 221 186    Cardiac Enzymes: Recent Labs  Lab 05/23/22 1145 05/23/22 1859 05/24/22 0320  CKTOTAL 19* 16* 19*  CKMB 1.6 1.4 1.0    BNP (last 3 results) Recent Labs    04/08/22 0336  BNP 51.6    ProBNP (last 3 results) No results for input(s): "PROBNP" in the last 8760 hours.  Radiological Exams: No results found.  Assessment/Plan Active Problems:   Sickle cell trait (HCC)   Acute on chronic respiratory failure with hypoxia (HCC)   Pneumonia of right lung due to Pseudomonas species (HCC)   Pleural effusion, right   Tracheostomy dependence (HCC)   Acute on chronic respiratory failure hypoxia we will continue with T collar on 28% FiO2 ventilator at night.  If patient is able to tolerate Sickle cell trait no change we will continue to monitor Pneumonia treated Pleural effusion status post drainage continue to monitor Tracheostomy remains in place   I have personally seen and evaluated the patient, evaluated laboratory and imaging results, formulated the assessment and plan and placed orders. The Patient requires high complexity decision making with multiple systems involvement.  Rounds were done with the Respiratory Therapy Director and Staff therapists and discussed with nursing staff also.  Allyne Gee, MD [facility]  Pulmonary Critical Care Medicine Sleep Medicine

## 2022-05-30 ENCOUNTER — Other Ambulatory Visit (HOSPITAL_COMMUNITY): Payer: Self-pay

## 2022-05-30 MED ORDER — IOHEXOL 350 MG/ML SOLN
75.0000 mL | Freq: Once | INTRAVENOUS | Status: AC | PRN
  Administered 2022-05-30: 75 mL via INTRAVENOUS

## 2022-05-30 NOTE — Progress Notes (Signed)
Pulmonary [place]   PULMONARY CRITICAL CARE SERVICE  PROGRESS NOTE     Sheila Fernandez  SWN:[id number]  DOB: 02-28-1951   DOA: 05/26/2022  Referring Physician: Satira Sark, MD  HPI: Sheila Fernandez is a 71 y.o. female being followed for ventilator/airway/oxygen weaning Acute on Chronic Respiratory Failure.  Patient currently is on full support assist control mode is on 28% FiO2  Medications: Reviewed on Rounds  Physical Exam:  Vitals: Temperature is 97.1 pulse of 91 respiratory rate is 22 blood pressure 117/69 saturations 100%  Ventilator Settings assist-control FiO2 is 28% tidal volume 400 PEEP of 5  General: Comfortable at this time Neck: supple Cardiovascular: no malignant arrhythmias Respiratory: Scattered coarse rhonchi Skin: no rash seen on limited exam Musculoskeletal: No gross abnormality Psychiatric:unable to assess Neurologic:no involuntary movements         Lab Data:   Basic Metabolic Panel: Recent Labs  Lab 05/24/22 0320 05/24/22 1837 05/25/22 0526 05/26/22 0600 05/28/22 0744 05/29/22 0345  NA 144 145 147* 149* 137  --   K 4.5 4.4 4.2 4.4 4.3  --   CL 106 107 108 109 96*  --   CO2 33* 31 34* 36* 31  --   GLUCOSE 159* 221* 152* 136* 157*  --   BUN 63* 39* 40* 21 27*  --   CREATININE 0.98 0.68 0.64 0.52 0.74  --   CALCIUM 9.4 9.2 9.1 9.2 9.2  --   MG 2.1  --  2.2 2.1 1.7 2.1    ABG: Recent Labs  Lab 05/27/22 1105  PHART 7.47*  PCO2ART 55*  PO2ART 88  HCO3 40.0*  O2SAT 100    Liver Function Tests: No results for input(s): "AST", "ALT", "ALKPHOS", "BILITOT", "PROT", "ALBUMIN" in the last 168 hours. No results for input(s): "LIPASE", "AMYLASE" in the last 168 hours. No results for input(s): "AMMONIA" in the last 168 hours.  CBC: Recent Labs  Lab 05/24/22 0320 05/24/22 1837 05/25/22 0526 05/26/22 0600 05/28/22 0744  WBC 13.4* 9.8 9.7 10.0 8.9  HGB 8.1* 8.3* 7.7* 9.2* 8.0*   HCT 26.0* 26.4* 24.6* 30.0* 24.9*  MCV 90.3 90.1 91.1 92.6 89.6  PLT 244 230 203 221 186    Cardiac Enzymes: Recent Labs  Lab 05/23/22 1145 05/23/22 1859 05/24/22 0320  CKTOTAL 19* 16* 19*  CKMB 1.6 1.4 1.0    BNP (last 3 results) Recent Labs    04/08/22 0336  BNP 51.6    ProBNP (last 3 results) No results for input(s): "PROBNP" in the last 8760 hours.  Radiological Exams: No results found.  Assessment/Plan Active Problems:   Sickle cell trait (HCC)   Acute on chronic respiratory failure with hypoxia (HCC)   Pneumonia of right lung due to Pseudomonas species (HCC)   Pleural effusion, right   Tracheostomy dependence (HCC)   Acute on chronic respiratory failure with hypoxia she has been failing attempts at weaning now is back on full support currently on assist control mode Sickle cell trait no change we will continue to follow along Pneumonia due to Pseudomonas supportive care Pleural effusion no change Tracheostomy remains in place   I have personally seen and evaluated the patient, evaluated laboratory and imaging results, formulated the assessment and plan and placed orders. The Patient requires high complexity decision making with multiple systems involvement.  Rounds were done with the Respiratory Therapy Director and Staff therapists and discussed with nursing staff also.  Allyne Gee, MD [facility]  Pulmonary Critical Care Medicine Sleep Medicine

## 2022-05-30 NOTE — Consult Note (Signed)
Infectious Disease Consultation   Sheila Fernandez  FMB:[id number]  DOB: Jan 19, 1951  DOA: 05/26/2022  Requesting physician: Dr. Owens Shark  Reason for consultation: Antibiotic recommendations  History of Present Illness: Sheila Fernandez is an 71 y.o. female with medical history significant for hypertension, orthostatic hypotension, arthritis, sickle cell trait, carpal tunnel syndrome who was initially admitted to the acute facility due to chest pain, abdominal pain complicated with shortness of breath.  CT of the abdomen and pelvis was unremarkable.  Patient had hypotensive episodes after she was started on esmolol because of hypertension and subsequently required Levophed because of hypotensive episodes.  This was accompanied with encephalopathy requiring intubation.  She was admitted to the ICU at acute facility on 03/11/2022.  On 03/13/2022 patient was extubated but had episodes of encephalopathy complicated with bradycardia.  There was also concern for seizures therefore patient was loaded with Keppra and reintubated.  EEG on 03/14/2022 was unremarkable.  She had imaging done which showed mild foraminal narrowing at bilateral C2-C3 with severe left and moderate right foraminal stenosis at C3-C4.  Moderate foraminal narrowing at C4-C5 worse on the left than right.  She had difficult extubation.  Patient failed spontaneous breathing trial and developed right-sided pneumonia likely secondary to aspiration.  She was started on Unasyn but continued to spike fevers therefore it was switched to IV Zosyn.  She was extubated on 03/17/2022 but required reintubation due to hypercapnia.  She had tracheostomy done on 03/18/2022.  Bronchoalveolar lavage cultures resulted with Pseudomonas and she received treatment with antibiotics.  [facility]  She initially improved.  However, she had 3 rapid responses on 05/03/2022 during which time she was noted to be asystolic and received CPR.   Again on 05/13/2022 patient was noted to have hypoxemia and rapid response was called.  On 05/23/2022 she was again bradycardic with heart rate in the 30s and was placed back on mechanical ventilation.  Chest x-ray showed bilateral infiltrate.  She was started on empiric IV antibiotics and was also started on Levophed drip and transferred to [facility].  She had echocardiogram which showed LVEF 70-75% and grade 1 diastolic dysfunction.  She was evaluated by cardiology at [facility]  Her carvedilol and metoprolol were held.  Eventually her bradycardia resolved and she remained in normal sinus rhythm and she was discharged back to [place]:  As per HPI otherwise 10 point review of systems negative.   Past Medical History: Past Medical History:  Diagnosis Date   Adenomyosis    Anemia    Arthritis    Hypertension    Iritis, recurrent    Orthostatic hypotension 01/21/2019   Sickle cell trait ([place])    Uterine fibroid     Past Surgical History: Past Surgical History:  Procedure Laterality Date   BREAST BIOPSY Left 1986   benign excisional no scar seen    BREAST LUMPECTOMY Left    Pt does not recall having lumpectomy   CESAREAN SECTION     CHOLECYSTECTOMY     FOOT SURGERY     cyst removed   IR GASTROSTOMY TUBE MOD SED  05/05/2022     Allergies:   Allergies  Allergen Reactions   Ativan [Lorazepam] Other (See Comments)    Severe lethargy and unable to remember events    Codeine Nausea And Vomiting     Social History:  reports that she quit  smoking about 18 years ago. Her smoking use included cigarettes. She has a 15.00 pack-year smoking history. She has never used smokeless tobacco. She reports that she does not drink alcohol and does not use drugs.   Family History: Family History  Problem Relation Age of Onset   Cancer Mother 44       Pancreatic   Hypertension Mother    Stroke Father    Cancer Father 86       Prostate   Hypertension  Father    Breast cancer Sister    Diabetes Sister    HIV Sister    Cancer Sister        Cancer r/t HIV   Lupus Daughter    Diabetes Maternal Grandmother    Diabetes Brother    Hypertension Brother    Kidney disease Brother    Cancer Brother        Prostate   Colon cancer Neg Hx    Esophageal cancer Neg Hx    Stomach cancer Neg Hx    Colon polyps Neg Hx    Physical Exam: Vitals: Temperature 97, pulse 70, respiratory 19, blood pressure 107/70, oxygen saturation 100%  Constitutional: Awake, ill-appearing Head: Atraumatic, normocephalic Eyes: PERLA ENMT: external ears and nose appear normal, normal hearing, moist oral mucosa Neck: Has trach  CVS: S1-S2  Respiratory: Decreased breath sounds lower lobes, rhonchi, no wheezing Abdomen: soft nontender, positive bowel sounds Musculoskeletal: no edema Neuro: Debility with generalized weakness Psych: No agitation  Skin: no rashes    Data reviewed:  I have personally reviewed following labs and imaging studies Labs:  CBC: Recent Labs  Lab 05/24/22 0320 05/24/22 1837 05/25/22 0526 05/26/22 0600 05/28/22 0744  WBC 13.4* 9.8 9.7 10.0 8.9  HGB 8.1* 8.3* 7.7* 9.2* 8.0*  HCT 26.0* 26.4* 24.6* 30.0* 24.9*  MCV 90.3 90.1 91.1 92.6 89.6  PLT 244 230 203 221 660    Basic Metabolic Panel: Recent Labs  Lab 05/24/22 0320 05/24/22 1837 05/25/22 0526 05/26/22 0600 05/28/22 0744 05/29/22 0345  NA 144 145 147* 149* 137  --   K 4.5 4.4 4.2 4.4 4.3  --   CL 106 107 108 109 96*  --   CO2 33* 31 34* 36* 31  --   GLUCOSE 159* 221* 152* 136* 157*  --   BUN 63* 39* 40* 21 27*  --   CREATININE 0.98 0.68 0.64 0.52 0.74  --   CALCIUM 9.4 9.2 9.1 9.2 9.2  --   MG 2.1  --  2.2 2.1 1.7 2.1   GFR Estimated Creatinine Clearance: 48.7 mL/min (by C-G formula based on SCr of 0.74 mg/dL). Liver Function Tests: No results for input(s): "AST", "ALT", "ALKPHOS", "BILITOT", "PROT", "ALBUMIN" in the last 168 hours. No results for input(s):  "LIPASE", "AMYLASE" in the last 168 hours. No results for input(s): "AMMONIA" in the last 168 hours. Coagulation profile No results for input(s): "INR", "PROTIME" in the last 168 hours.  Cardiac Enzymes: Recent Labs  Lab 05/24/22 0320  CKTOTAL 19*  CKMB 1.0   BNP: Invalid input(s): "POCBNP" CBG: Recent Labs  Lab 05/26/22 1147  GLUCAP 169*   D-Dimer No results for input(s): "DDIMER" in the last 72 hours. Hgb A1c No results for input(s): "HGBA1C" in the last 72 hours. Lipid Profile No results for input(s): "CHOL", "HDL", "LDLCALC", "TRIG", "CHOLHDL", "LDLDIRECT" in the last 72 hours. Thyroid function studies No results for input(s): "TSH", "T4TOTAL", "T3FREE", "THYROIDAB" in the last 72 hours.  Invalid  input(s): "FREET3" Anemia work up No results for input(s): "VITAMINB12", "FOLATE", "FERRITIN", "TIBC", "IRON", "RETICCTPCT" in the last 72 hours. Urinalysis    Component Value Date/Time   COLORURINE YELLOW 03/11/2022 1819   APPEARANCEUR CLEAR 03/11/2022 1819   LABSPEC 1.010 03/11/2022 1819   PHURINE 7.0 03/11/2022 1819   GLUCOSEU NEGATIVE 03/11/2022 1819   HGBUR NEGATIVE 03/11/2022 1819   BILIRUBINUR NEGATIVE 03/11/2022 1819   KETONESUR 5 (A) 03/11/2022 1819   PROTEINUR NEGATIVE 03/11/2022 1819   UROBILINOGEN 0.2 09/05/2013 1903   NITRITE NEGATIVE 03/11/2022 1819   LEUKOCYTESUR SMALL (A) 03/11/2022 1819     Sepsis Labs Recent Labs  Lab 05/24/22 1837 05/25/22 0526 05/26/22 0600 05/28/22 0744  WBC 9.8 9.7 10.0 8.9   Microbiology Recent Results (from the past 240 hour(s))  Culture, Respiratory w Gram Stain     Status: None   Collection Time: 05/23/22  2:03 PM   Specimen: Tracheal Aspirate; Respiratory  Result Value Ref Range Status   Specimen Description TRACHEAL ASPIRATE  Final   Special Requests NONE  Final   Gram Stain   Final    ABUNDANT WBC PRESENT, PREDOMINANTLY PMN FEW GRAM POSITIVE COCCI IN PAIRS FEW GRAM NEGATIVE RODS Performed at [facility], [place] [address], [place], [place] [address]    Culture MODERATE PSEUDOMONAS AERUGINOSA  Final   Report Status 05/25/2022 FINAL  Final   Organism ID, Bacteria PSEUDOMONAS AERUGINOSA  Final      Susceptibility   Pseudomonas aeruginosa - MIC*    CEFTAZIDIME 8 SENSITIVE Sensitive     CIPROFLOXACIN 0.5 SENSITIVE Sensitive     GENTAMICIN 4 SENSITIVE Sensitive     IMIPENEM 1 SENSITIVE Sensitive     PIP/TAZO 16 SENSITIVE Sensitive     CEFEPIME 2 SENSITIVE Sensitive     * MODERATE PSEUDOMONAS AERUGINOSA  MRSA Next Gen by PCR, Nasal     Status: None   Collection Time: 05/24/22  5:09 PM   Specimen: Nasal Mucosa; Nasal Swab  Result Value Ref Range Status   MRSA by PCR Next Gen NOT DETECTED NOT DETECTED Final    Comment: (NOTE) The GeneXpert MRSA Assay (FDA approved for NASAL specimens only), is one component of a comprehensive MRSA colonization surveillance program. It is not intended to diagnose MRSA infection nor to guide or monitor treatment for MRSA infections. Test performance is not FDA approved in patients less than 19 years old. Performed at [facility], [place] [address], [place], [place] [address]      Inpatient Medications:   Scheduled Meds: Please see MAR   Impression/Recommendations Active Problems:    Acute on chronic respiratory failure with hypoxia (HCC)   Pneumonia due to Pseudomonas species (HCC)   Pleural effusion, right   Tracheostomy dependence (HCC) Sickle cell trait (HCC) Bradycardia with syncopal episodes Cervical radiculopathy with bilateral carpal tunnel syndrome Dysphagia/protein calorie malnutrition  Acute on chronic respiratory failure with hypoxemia/recurrent pneumonia secondary to Pseudomonas/pleural effusion: Patient has chronic trach.  She has had recurrent pneumonia with Pseudomonas and received treatment with multiple rounds of antibiotics.  Recent respiratory cultures from 05/23/2022 again showing Pseudomonas.  She is on treatment with  meropenem.  At this time I would recommend to extend the meropenem for another week.  Would also recommend to add tobramycin nebulizers.  Recommend to check chest CT which can be done without contrast if concern for renal compromise.  Unfortunately she also has dysphagia and because of this she is high risk for recurrent aspiration  and aspiration pneumonia.  She also has had bradycardia with unresponsive/syncopal episodes which could have also been contributors to the aspiration pneumonia episodes.  Unfortunately she continues to have trach and also at risk for recurrent tracheobronchitis with recurrent bronchopneumonia.  Please monitor CBC, BUN/creatinine while on antibiotics.   Management of pleural effusion per the primary team.  Sickle cell trait: Management per the primary team.  Bradycardia with syncopal episodes: Patient has had bradycardia and became unresponsive from the bradycardia at which time she has had these aspiration episodes which resulted in the pneumonia.  Cardiology consulted and following.  She may need to be evaluated for a pacemaker if continues to be symptomatic from the bradycardia. Further management per primary team and cardiology.  Dysphagia/protein calorie malnutrition: Due to her dysphagia she is high risk for recurrent aspiration and aspiration pneumonia.  Management of PCM per the primary team.  Due to her complex medical problems she is at risk for worsening and decompensation.   Thank you for this consultation. Plan of care discussed with the primary team and pharmacy.  Coe Angelos M.D.

## 2022-05-31 LAB — MAGNESIUM: Magnesium: 1.9 mg/dL (ref 1.7–2.4)

## 2022-05-31 LAB — BASIC METABOLIC PANEL
Anion gap: 6 (ref 5–15)
BUN: 20 mg/dL (ref 8–23)
CO2: 30 mmol/L (ref 22–32)
Calcium: 8.7 mg/dL — ABNORMAL LOW (ref 8.9–10.3)
Chloride: 97 mmol/L — ABNORMAL LOW (ref 98–111)
Creatinine, Ser: 0.44 mg/dL (ref 0.44–1.00)
GFR, Estimated: >60 mL/min (ref >60)
Glucose, Bld: 124 mg/dL — ABNORMAL HIGH (ref 70–99)
Potassium: 3.9 mmol/L (ref 3.5–5.1)
Sodium: 133 mmol/L — ABNORMAL LOW (ref 135–145)

## 2022-05-31 LAB — CBC
HCT: 19.7 % — ABNORMAL LOW (ref 36.0–46.0)
Hemoglobin: 6.5 g/dL — CL (ref 12.0–15.0)
MCH: 28.9 pg (ref 26.0–34.0)
MCHC: 33 g/dL (ref 30.0–36.0)
MCV: 87.6 fL (ref 80.0–100.0)
Platelets: 143 10*3/uL — ABNORMAL LOW (ref 150–400)
RBC: 2.25 MIL/uL — ABNORMAL LOW (ref 3.87–5.11)
RDW: 17.8 % — ABNORMAL HIGH (ref 11.5–15.5)
WBC: 4.9 10*3/uL (ref 4.0–10.5)
nRBC: 0 % (ref 0.0–0.2)

## 2022-05-31 LAB — PREPARE RBC (CROSSMATCH)

## 2022-05-31 LAB — HEMOGLOBIN AND HEMATOCRIT, BLOOD
HCT: 29.9 % — ABNORMAL LOW (ref 36.0–46.0)
Hemoglobin: 10.2 g/dL — ABNORMAL LOW (ref 12.0–15.0)

## 2022-05-31 NOTE — Progress Notes (Signed)
Pulmonary [place]   PULMONARY CRITICAL CARE SERVICE  PROGRESS NOTE     Sheila Fernandez  XVQ:[id number]  DOB: Jan 01, 1951   DOA: 05/26/2022  Referring Physician: Satira Sark, MD  HPI: Sheila Fernandez is a 71 y.o. female being followed for ventilator/airway/oxygen weaning Acute on Chronic Respiratory Failure.  Patient is full support on the ventilator had a hemoglobin of 6.9 and is going to be transfused  Medications: Reviewed on Rounds  Physical Exam:  Vitals: Temperature is 98.0 pulse 72 respiratory rate is 20 blood pressure is 113/65 saturations 100%  Ventilator Settings on assist control FiO2 is 28% tidal volume is 390 PEEP of 5  General: Comfortable at this time Neck: supple Cardiovascular: no malignant arrhythmias Respiratory: Coarse rhonchi expansion is equal Skin: no rash seen on limited exam Musculoskeletal: No gross abnormality Psychiatric:unable to assess Neurologic:no involuntary movements         Lab Data:   Basic Metabolic Panel: Recent Labs  Lab 05/24/22 1837 05/25/22 0526 05/26/22 0600 05/28/22 0744 05/29/22 0345 05/31/22 0347  NA 145 147* 149* 137  --  133*  K 4.4 4.2 4.4 4.3  --  3.9  CL 107 108 109 96*  --  97*  CO2 31 34* 36* 31  --  30  GLUCOSE 221* 152* 136* 157*  --  124*  BUN 39* 40* 21 27*  --  20  CREATININE 0.68 0.64 0.52 0.74  --  0.44  CALCIUM 9.2 9.1 9.2 9.2  --  8.7*  MG  --  2.2 2.1 1.7 2.1 1.9    ABG: Recent Labs  Lab 05/27/22 1105  PHART 7.47*  PCO2ART 55*  PO2ART 88  HCO3 40.0*  O2SAT 100    Liver Function Tests: No results for input(s): "AST", "ALT", "ALKPHOS", "BILITOT", "PROT", "ALBUMIN" in the last 168 hours. No results for input(s): "LIPASE", "AMYLASE" in the last 168 hours. No results for input(s): "AMMONIA" in the last 168 hours.  CBC: Recent Labs  Lab 05/24/22 1837 05/25/22 0526 05/26/22 0600 05/28/22 0744 05/31/22 0347  WBC 9.8 9.7 10.0  8.9 4.9  HGB 8.3* 7.7* 9.2* 8.0* 6.5*  HCT 26.4* 24.6* 30.0* 24.9* 19.7*  MCV 90.1 91.1 92.6 89.6 87.6  PLT 230 203 221 186 143*    Cardiac Enzymes: No results for input(s): "CKTOTAL", "CKMB", "CKMBINDEX", "TROPONINI" in the last 168 hours.  BNP (last 3 results) Recent Labs    04/08/22 0336  BNP 51.6    ProBNP (last 3 results) No results for input(s): "PROBNP" in the last 8760 hours.  Radiological Exams: CT CHEST W CONTRAST  Result Date: 05/30/2022 CLINICAL DATA:  Pneumonia, effusion, difficulty breathing EXAM: CT CHEST WITH CONTRAST TECHNIQUE: Multidetector CT imaging of the chest was performed during intravenous contrast administration. RADIATION DOSE REDUCTION: This exam was performed according to the departmental dose-optimization program which includes automated exposure control, adjustment of the mA and/or kV according to patient size and/or use of iterative reconstruction technique. CONTRAST:  68m OMNIPAQUE IOHEXOL 350 MG/ML SOLN COMPARISON:  Previous studies including chest radiograph done on 05/27/2022 FINDINGS: Cardiovascular: There is homogeneous enhancement in thoracic aorta. There are no intraluminal filling defects in central pulmonary artery branches. Evaluation of small peripheral branches in the lower lung fields is limited by infiltrates. Mediastinum/Nodes: No significant lymphadenopathy is seen. Tracheostomy is noted in place. Lungs/Pleura: There is large infiltrate in right lower lobe with air bronchograms. There is moderate sized infiltrate in the left lower lobe with  air bronchogram. There are no signs of alveolar pulmonary edema. There is minimal left pleural effusion. There is no pneumothorax. Upper Abdomen: There is fatty infiltration of the liver. Musculoskeletal: Unremarkable. IMPRESSION: There is no evidence of thoracic aortic dissection. There is no evidence of central pulmonary artery embolism. Evaluation of small peripheral pulmonary artery branches in the lower  lung fields is limited by infiltrates. Infiltrates are seen in both lower lung fields, more so on the right side suggesting atelectasis/pneumonia. Minimal left pleural effusion. Fatty liver. Electronically Signed   By: Elmer Picker M.D.   On: 05/30/2022 13:12    Assessment/Plan Active Problems:   Sickle cell trait (HCC)   Acute on chronic respiratory failure with hypoxia (HCC)   Pneumonia of right lung due to Pseudomonas species (HCC)   Pleural effusion, right   Tracheostomy dependence (HCC)   Acute on chronic respiratory failure hypoxia plan holding off on the wean today patient will be transfused today currently is on assist control mode has been on 28% FiO2 Sickle cell trait no change we will continue to monitor and follow along closely Pneumonia due to Pseudomonas treated Pleural effusion no change we will continue to follow along Tracheostomy remains in place   I have personally seen and evaluated the patient, evaluated laboratory and imaging results, formulated the assessment and plan and placed orders. The Patient requires high complexity decision making with multiple systems involvement.  Rounds were done with the Respiratory Therapy Director and Staff therapists and discussed with nursing staff also.  Allyne Gee, MD [facility] Pulmonary Critical Care Medicine Sleep Medicine

## 2022-06-01 LAB — TYPE AND SCREEN
ABO/RH(D): O NEG
Antibody Screen: NEGATIVE
Unit division: 0

## 2022-06-01 LAB — CBC
HCT: 26.8 % — ABNORMAL LOW (ref 36.0–46.0)
Hemoglobin: 9.1 g/dL — ABNORMAL LOW (ref 12.0–15.0)
MCH: 29.2 pg (ref 26.0–34.0)
MCHC: 34 g/dL (ref 30.0–36.0)
MCV: 85.9 fL (ref 80.0–100.0)
Platelets: 171 10*3/uL (ref 150–400)
RBC: 3.12 MIL/uL — ABNORMAL LOW (ref 3.87–5.11)
RDW: 16.8 % — ABNORMAL HIGH (ref 11.5–15.5)
WBC: 6.1 10*3/uL (ref 4.0–10.5)
nRBC: 0 % (ref 0.0–0.2)

## 2022-06-01 LAB — BPAM RBC
Blood Product Expiration Date: 202309082359
ISSUE DATE / TIME: 202309021048
Unit Type and Rh: 9500

## 2022-06-01 LAB — COMPREHENSIVE METABOLIC PANEL
ALT: 49 U/L — ABNORMAL HIGH (ref 0–44)
AST: 24 U/L (ref 15–41)
Albumin: 2.8 g/dL — ABNORMAL LOW (ref 3.5–5.0)
Alkaline Phosphatase: 61 U/L (ref 38–126)
Anion gap: 7 (ref 5–15)
BUN: 16 mg/dL (ref 8–23)
CO2: 30 mmol/L (ref 22–32)
Calcium: 9.1 mg/dL (ref 8.9–10.3)
Chloride: 98 mmol/L (ref 98–111)
Creatinine, Ser: 0.42 mg/dL — ABNORMAL LOW (ref 0.44–1.00)
GFR, Estimated: >60 mL/min (ref >60)
Glucose, Bld: 94 mg/dL (ref 70–99)
Potassium: 3.9 mmol/L (ref 3.5–5.1)
Sodium: 135 mmol/L (ref 135–145)
Total Bilirubin: 1.1 mg/dL (ref 0.3–1.2)
Total Protein: 5.4 g/dL — ABNORMAL LOW (ref 6.5–8.1)

## 2022-06-01 LAB — MAGNESIUM: Magnesium: 1.9 mg/dL (ref 1.7–2.4)

## 2022-06-01 NOTE — Progress Notes (Signed)
Pulmonary [place]   PULMONARY CRITICAL CARE SERVICE  PROGRESS NOTE     Sheila Fernandez  FBP:[id number]  DOB: [birth year]   DOA: 05/26/2022  Referring Physician: Satira Sark, MD  HPI: Sheila Fernandez is a 71 y.o. female being followed for ventilator/airway/oxygen weaning Acute on Chronic Respiratory Failure.  Patient is on the ventilator and full support currently assist control mode has been on 28% FiO2 with good saturations.  Medications: Reviewed on Rounds  Physical Exam:  Vitals: Temperature is 97.4 pulse of 61 respiratory rate is 22 blood pressure 140/71 saturations 100%  Ventilator Settings assist-control FiO2 28% tidal volume 400 PEEP of 5  General: Comfortable at this time Neck: supple Cardiovascular: no malignant arrhythmias Respiratory: Scattered rhonchi expansion is equal Skin: no rash seen on limited exam Musculoskeletal: No gross abnormality Psychiatric:unable to assess Neurologic:no involuntary movements         Lab Data:   Basic Metabolic Panel: Recent Labs  Lab 05/26/22 0600 05/28/22 0744 05/29/22 0345 05/31/22 0347 06/01/22 0313  NA 149* 137  --  133* 135  K 4.4 4.3  --  3.9 3.9  CL 109 96*  --  97* 98  CO2 36* 31  --  30 30  GLUCOSE 136* 157*  --  124* 94  BUN 21 27*  --  20 16  CREATININE 0.52 0.74  --  0.44 0.42*  CALCIUM 9.2 9.2  --  8.7* 9.1  MG 2.1 1.7 2.1 1.9 1.9    ABG: Recent Labs  Lab 05/27/22 1105  PHART 7.47*  PCO2ART 55*  PO2ART 88  HCO3 40.0*  O2SAT 100    Liver Function Tests: Recent Labs  Lab 06/01/22 0313  AST 24  ALT 49*  ALKPHOS 61  BILITOT 1.1  PROT 5.4*  ALBUMIN 2.8*   No results for input(s): "LIPASE", "AMYLASE" in the last 168 hours. No results for input(s): "AMMONIA" in the last 168 hours.  CBC: Recent Labs  Lab 05/26/22 0600 05/28/22 0744 05/31/22 0347 05/31/22 1653 06/01/22 0313  WBC 10.0 8.9 4.9  --  6.1  HGB 9.2* 8.0* 6.5* 10.2*  9.1*  HCT 30.0* 24.9* 19.7* 29.9* 26.8*  MCV 92.6 89.6 87.6  --  85.9  PLT 221 186 143*  --  171    Cardiac Enzymes: No results for input(s): "CKTOTAL", "CKMB", "CKMBINDEX", "TROPONINI" in the last 168 hours.  BNP (last 3 results) Recent Labs    04/08/22 0336  BNP 51.6    ProBNP (last 3 results) No results for input(s): "PROBNP" in the last 8760 hours.  Radiological Exams: CT CHEST W CONTRAST  Result Date: 05/30/2022 CLINICAL DATA:  Pneumonia, effusion, difficulty breathing EXAM: CT CHEST WITH CONTRAST TECHNIQUE: Multidetector CT imaging of the chest was performed during intravenous contrast administration. RADIATION DOSE REDUCTION: This exam was performed according to the departmental dose-optimization program which includes automated exposure control, adjustment of the mA and/or kV according to patient size and/or use of iterative reconstruction technique. CONTRAST:  65m OMNIPAQUE IOHEXOL 350 MG/ML SOLN COMPARISON:  Previous studies including chest radiograph done on 05/27/2022 FINDINGS: Cardiovascular: There is homogeneous enhancement in thoracic aorta. There are no intraluminal filling defects in central pulmonary artery branches. Evaluation of small peripheral branches in the lower lung fields is limited by infiltrates. Mediastinum/Nodes: No significant lymphadenopathy is seen. Tracheostomy is noted in place. Lungs/Pleura: There is large infiltrate in right lower lobe with air bronchograms. There is moderate sized infiltrate in the left lower  lobe with air bronchogram. There are no signs of alveolar pulmonary edema. There is minimal left pleural effusion. There is no pneumothorax. Upper Abdomen: There is fatty infiltration of the liver. Musculoskeletal: Unremarkable. IMPRESSION: There is no evidence of thoracic aortic dissection. There is no evidence of central pulmonary artery embolism. Evaluation of small peripheral pulmonary artery branches in the lower lung fields is limited by  infiltrates. Infiltrates are seen in both lower lung fields, more so on the right side suggesting atelectasis/pneumonia. Minimal left pleural effusion. Fatty liver. Electronically Signed   By: Elmer Picker M.D.   On: 05/30/2022 13:12    Assessment/Plan Active Problems:   Sickle cell trait (HCC)   Acute on chronic respiratory failure with hypoxia (HCC)   Pneumonia of right lung due to Pseudomonas species (HCC)   Pleural effusion, right   Tracheostomy dependence (HCC)   Acute on chronic respiratory failure hypoxia plan is to continue to continue with full ventilator support she is still not able to tolerate attempts at weaning we will have respiratory therapy continue to assess daily Sickle cell trait supportive care we will continue to monitor along closely. Pneumonia has been treated with antibiotics Pleural effusion no change we will continue to monitor Tracheostomy remains in place   I have personally seen and evaluated the patient, evaluated laboratory and imaging results, formulated the assessment and plan and placed orders. The Patient requires high complexity decision making with multiple systems involvement.  Rounds were done with the Respiratory Therapy Director and Staff therapists and discussed with nursing staff also.  Allyne Gee, MD [facility] Pulmonary Critical Care Medicine Sleep Medicine

## 2022-06-02 LAB — CBC
HCT: 31.9 % — ABNORMAL LOW (ref 36.0–46.0)
Hemoglobin: 10.5 g/dL — ABNORMAL LOW (ref 12.0–15.0)
MCH: 28.8 pg (ref 26.0–34.0)
MCHC: 32.9 g/dL (ref 30.0–36.0)
MCV: 87.6 fL (ref 80.0–100.0)
Platelets: 190 10*3/uL (ref 150–400)
RBC: 3.64 MIL/uL — ABNORMAL LOW (ref 3.87–5.11)
RDW: 16.2 % — ABNORMAL HIGH (ref 11.5–15.5)
WBC: 5.3 10*3/uL (ref 4.0–10.5)
nRBC: 0 % (ref 0.0–0.2)

## 2022-06-02 NOTE — Progress Notes (Signed)
Pulmonary [place]   PULMONARY CRITICAL CARE SERVICE  PROGRESS NOTE     TAWNI MELKONIAN  JIR:[id number]  DOB: Jun 23, 1951   DOA: 05/26/2022  Referring Physician: Satira Sark, MD  HPI: Sheila Fernandez is a 71 y.o. female being followed for ventilator/airway/oxygen weaning Acute on Chronic Respiratory Failure.  Patient was started back on T collar now is on 28% weaning  Medications: Reviewed on Rounds  Physical Exam:  Vitals: Temperature is 97.3 pulse 66 respiratory rate 16 blood pressure 98/62 saturations 100%  Ventilator Settings T collar 28%  General: Comfortable at this time Neck: supple Cardiovascular: no malignant arrhythmias Respiratory: No rhonchi no rales are noted at this time Skin: no rash seen on limited exam Musculoskeletal: No gross abnormality Psychiatric:unable to assess Neurologic:no involuntary movements         Lab Data:   Basic Metabolic Panel: Recent Labs  Lab 05/28/22 0744 05/29/22 0345 05/31/22 0347 06/01/22 0313  NA 137  --  133* 135  K 4.3  --  3.9 3.9  CL 96*  --  97* 98  CO2 31  --  30 30  GLUCOSE 157*  --  124* 94  BUN 27*  --  20 16  CREATININE 0.74  --  0.44 0.42*  CALCIUM 9.2  --  8.7* 9.1  MG 1.7 2.1 1.9 1.9    ABG: Recent Labs  Lab 05/27/22 1105  PHART 7.47*  PCO2ART 55*  PO2ART 88  HCO3 40.0*  O2SAT 100    Liver Function Tests: Recent Labs  Lab 06/01/22 0313  AST 24  ALT 49*  ALKPHOS 61  BILITOT 1.1  PROT 5.4*  ALBUMIN 2.8*   No results for input(s): "LIPASE", "AMYLASE" in the last 168 hours. No results for input(s): "AMMONIA" in the last 168 hours.  CBC: Recent Labs  Lab 05/28/22 0744 05/31/22 0347 05/31/22 1653 06/01/22 0313 06/02/22 0244  WBC 8.9 4.9  --  6.1 5.3  HGB 8.0* 6.5* 10.2* 9.1* 10.5*  HCT 24.9* 19.7* 29.9* 26.8* 31.9*  MCV 89.6 87.6  --  85.9 87.6  PLT 186 143*  --  171 190    Cardiac Enzymes: No results for input(s):  "CKTOTAL", "CKMB", "CKMBINDEX", "TROPONINI" in the last 168 hours.  BNP (last 3 results) Recent Labs    04/08/22 0336  BNP 51.6    ProBNP (last 3 results) No results for input(s): "PROBNP" in the last 8760 hours.  Radiological Exams: No results found.  Assessment/Plan Active Problems:   Sickle cell trait (HCC)   Acute on chronic respiratory failure with hypoxia (HCC)   Pneumonia of right lung due to Pseudomonas species (HCC)   Pleural effusion, right   Tracheostomy dependence (HCC)   Acute on chronic respiratory failure with hypoxia plan is going to continue with the T-piece currently is on 28% FiO2 we will continue secretion management supportive care Sickle cell trait no change we will continue to follow along Pneumonia secondary to Pseudomonas has been treated Pleural effusion following on x-rays Tracheostomy will remain in place   I have personally seen and evaluated the patient, evaluated laboratory and imaging results, formulated the assessment and plan and placed orders. The Patient requires high complexity decision making with multiple systems involvement.  Rounds were done with the Respiratory Therapy Director and Staff therapists and discussed with nursing staff also.  Allyne Gee, MD [facility] Pulmonary Critical Care Medicine Sleep Medicine

## 2022-06-03 NOTE — Telephone Encounter (Signed)
From patient.

## 2022-06-03 NOTE — Progress Notes (Signed)
Pulmonary [place]   PULMONARY CRITICAL CARE SERVICE  PROGRESS NOTE     RICHA SHOR  HDQ:[id number]  DOB: [birth year]   DOA: 05/26/2022  Referring Physician: Satira Sark, MD  HPI: Sheila Fernandez is a 71 y.o. female being followed for ventilator/airway/oxygen weaning Acute on Chronic Respiratory Failure.  Patient is supposed to do 16 hours of T collar this morning was resting on assist control  Medications: Reviewed on Rounds  Physical Exam:  Vitals: Temperature is 97.0 pulse 78 respiratory 16 blood pressures 157/92 saturations 99%  Ventilator Settings assist-control FiO2 28% tidal volume 400 PEEP 5  General: Comfortable at this time Neck: supple Cardiovascular: no malignant arrhythmias Respiratory: No rhonchi very coarse breath sounds Skin: no rash seen on limited exam Musculoskeletal: No gross abnormality Psychiatric:unable to assess Neurologic:no involuntary movements         Lab Data:   Basic Metabolic Panel: Recent Labs  Lab 05/28/22 0744 05/29/22 0345 05/31/22 0347 06/01/22 0313  NA 137  --  133* 135  K 4.3  --  3.9 3.9  CL 96*  --  97* 98  CO2 31  --  30 30  GLUCOSE 157*  --  124* 94  BUN 27*  --  20 16  CREATININE 0.74  --  0.44 0.42*  CALCIUM 9.2  --  8.7* 9.1  MG 1.7 2.1 1.9 1.9    ABG: Recent Labs  Lab 05/27/22 1105  PHART 7.47*  PCO2ART 55*  PO2ART 88  HCO3 40.0*  O2SAT 100    Liver Function Tests: Recent Labs  Lab 06/01/22 0313  AST 24  ALT 49*  ALKPHOS 61  BILITOT 1.1  PROT 5.4*  ALBUMIN 2.8*   No results for input(s): "LIPASE", "AMYLASE" in the last 168 hours. No results for input(s): "AMMONIA" in the last 168 hours.  CBC: Recent Labs  Lab 05/28/22 0744 05/31/22 0347 05/31/22 1653 06/01/22 0313 06/02/22 0244  WBC 8.9 4.9  --  6.1 5.3  HGB 8.0* 6.5* 10.2* 9.1* 10.5*  HCT 24.9* 19.7* 29.9* 26.8* 31.9*  MCV 89.6 87.6  --  85.9 87.6  PLT 186 143*  --  171  190    Cardiac Enzymes: No results for input(s): "CKTOTAL", "CKMB", "CKMBINDEX", "TROPONINI" in the last 168 hours.  BNP (last 3 results) Recent Labs    04/08/22 0336  BNP 51.6    ProBNP (last 3 results) No results for input(s): "PROBNP" in the last 8760 hours.  Radiological Exams: No results found.  Assessment/Plan Active Problems:   Sickle cell trait (HCC)   Acute on chronic respiratory failure with hypoxia (HCC)   Pneumonia of right lung due to Pseudomonas species (HCC)   Pleural effusion, right   Tracheostomy dependence (HCC)   Acute on chronic respiratory failure hypoxia plan we will try T collar wean 16 hours again continue with secretion management supportive care Sickle cell trait no change Pleural effusion supportive care Pneumonia treated we will continue to follow along Tracheostomy remains in place   I have personally seen and evaluated the patient, evaluated laboratory and imaging results, formulated the assessment and plan and placed orders. The Patient requires high complexity decision making with multiple systems involvement.  Rounds were done with the Respiratory Therapy Director and Staff therapists and discussed with nursing staff also.  Allyne Gee, MD [facility] Pulmonary Critical Care Medicine Sleep Medicine

## 2022-06-04 NOTE — Progress Notes (Signed)
Pulmonary [place]   PULMONARY CRITICAL CARE SERVICE  PROGRESS NOTE     DEVITA NIES  XKG:[id number]  DOB: 1951/09/20   DOA: 05/26/2022  Referring Physician: Satira Sark, MD  HPI: Sheila Fernandez is a 71 y.o. female being followed for ventilator/airway/oxygen weaning Acute on Chronic Respiratory Failure.  Patient currently is on T collar has been on 28% FiO2 good saturations  Medications: Reviewed on Rounds  Physical Exam:  Vitals: Temperature is 96.9 pulse 83 respiratory 17 blood pressure 114/70 saturations 98%  Ventilator Settings T collar FiO2 is 28%  General: Comfortable at this time Neck: supple Cardiovascular: no malignant arrhythmias Respiratory: No rhonchi no rales are noted Skin: no rash seen on limited exam Musculoskeletal: No gross abnormality Psychiatric:unable to assess Neurologic:no involuntary movements         Lab Data:   Basic Metabolic Panel: Recent Labs  Lab 05/29/22 0345 05/31/22 0347 06/01/22 0313  NA  --  133* 135  K  --  3.9 3.9  CL  --  97* 98  CO2  --  30 30  GLUCOSE  --  124* 94  BUN  --  20 16  CREATININE  --  0.44 0.42*  CALCIUM  --  8.7* 9.1  MG 2.1 1.9 1.9    ABG: No results for input(s): "PHART", "PCO2ART", "PO2ART", "HCO3", "O2SAT" in the last 168 hours.  Liver Function Tests: Recent Labs  Lab 06/01/22 0313  AST 24  ALT 49*  ALKPHOS 61  BILITOT 1.1  PROT 5.4*  ALBUMIN 2.8*   No results for input(s): "LIPASE", "AMYLASE" in the last 168 hours. No results for input(s): "AMMONIA" in the last 168 hours.  CBC: Recent Labs  Lab 05/31/22 0347 05/31/22 1653 06/01/22 0313 06/02/22 0244  WBC 4.9  --  6.1 5.3  HGB 6.5* 10.2* 9.1* 10.5*  HCT 19.7* 29.9* 26.8* 31.9*  MCV 87.6  --  85.9 87.6  PLT 143*  --  171 190    Cardiac Enzymes: No results for input(s): "CKTOTAL", "CKMB", "CKMBINDEX", "TROPONINI" in the last 168 hours.  BNP (last 3 results) Recent  Labs    04/08/22 0336  BNP 51.6    ProBNP (last 3 results) No results for input(s): "PROBNP" in the last 8760 hours.  Radiological Exams: No results found.  Assessment/Plan Active Problems:   Sickle cell trait (HCC)   Acute on chronic respiratory failure with hypoxia (HCC)   Pneumonia of right lung due to Pseudomonas species (HCC)   Pleural effusion, right   Tracheostomy dependence (HCC)   Acute on chronic respiratory failure hypoxia we will continue with T collar patient currently is on 28% FiO2 goal of 16 hours Sickle cell trait no change we will continue to follow along closely Pleural effusion patient is at baseline Tracheostomy remains in place Pneumonia has been treated we will continue to monitor   I have personally seen and evaluated the patient, evaluated laboratory and imaging results, formulated the assessment and plan and placed orders. The Patient requires high complexity decision making with multiple systems involvement.  Rounds were done with the Respiratory Therapy Director and Staff therapists and discussed with nursing staff also.  Allyne Gee, MD [facility] Pulmonary Critical Care Medicine Sleep Medicine

## 2022-06-05 LAB — BASIC METABOLIC PANEL
Anion gap: 11 (ref 5–15)
BUN: 20 mg/dL (ref 8–23)
CO2: 27 mmol/L (ref 22–32)
Calcium: 9.2 mg/dL (ref 8.9–10.3)
Chloride: 95 mmol/L — ABNORMAL LOW (ref 98–111)
Creatinine, Ser: 0.49 mg/dL (ref 0.44–1.00)
GFR, Estimated: >60 mL/min (ref >60)
Glucose, Bld: 127 mg/dL — ABNORMAL HIGH (ref 70–99)
Potassium: 4.3 mmol/L (ref 3.5–5.1)
Sodium: 133 mmol/L — ABNORMAL LOW (ref 135–145)

## 2022-06-05 LAB — CBC
HCT: 31.2 % — ABNORMAL LOW (ref 36.0–46.0)
Hemoglobin: 10.6 g/dL — ABNORMAL LOW (ref 12.0–15.0)
MCH: 29.3 pg (ref 26.0–34.0)
MCHC: 34 g/dL (ref 30.0–36.0)
MCV: 86.2 fL (ref 80.0–100.0)
Platelets: 161 10*3/uL (ref 150–400)
RBC: 3.62 MIL/uL — ABNORMAL LOW (ref 3.87–5.11)
RDW: 15.6 % — ABNORMAL HIGH (ref 11.5–15.5)
WBC: 5.2 10*3/uL (ref 4.0–10.5)
nRBC: 0 % (ref 0.0–0.2)

## 2022-06-05 NOTE — Progress Notes (Cosign Needed)
Pulmonary [place]   PULMONARY CRITICAL CARE SERVICE  PROGRESS NOTE     Sheila Fernandez  QIO:[id number]  DOB: [birth year]   DOA: 05/26/2022  Referring Physician: Satira Sark, MD  HPI: Sheila Fernandez is a 71 y.o. female being followed for ventilator/airway/oxygen weaning Acute on Chronic Respiratory Failure.  Patient seen lying in bed, currently on full support.  Does have a baseline of 16 hours ATC with 8 hours of full support during nocturnal hours.  Did discuss with respiratory that we will need to attempt to get the 8-hour full support back at a reasonable nocturnal time.  Medications: Reviewed on Rounds  Physical Exam:  Vitals: Temp 96.6, pulse 74, respirations 15, BP 115/82, SPO2 100%  Ventilator Settings AC VC plus, FiO2 20%, tidal volume 400, rate 12, PEEP 5  General: Comfortable at this time Neck: supple Cardiovascular: no malignant arrhythmias Respiratory: Bilaterally diminished Skin: no rash seen on limited exam Musculoskeletal: No gross abnormality Psychiatric:unable to assess Neurologic:no involuntary movements         Lab Data:   Basic Metabolic Panel: Recent Labs  Lab 05/31/22 0347 06/01/22 0313 06/05/22 0646  NA 133* 135 133*  K 3.9 3.9 4.3  CL 97* 98 95*  CO2 '30 30 27  '$ GLUCOSE 124* 94 127*  BUN '20 16 20  '$ CREATININE 0.44 0.42* 0.49  CALCIUM 8.7* 9.1 9.2  MG 1.9 1.9  --     ABG: No results for input(s): "PHART", "PCO2ART", "PO2ART", "HCO3", "O2SAT" in the last 168 hours.  Liver Function Tests: Recent Labs  Lab 06/01/22 0313  AST 24  ALT 49*  ALKPHOS 61  BILITOT 1.1  PROT 5.4*  ALBUMIN 2.8*   No results for input(s): "LIPASE", "AMYLASE" in the last 168 hours. No results for input(s): "AMMONIA" in the last 168 hours.  CBC: Recent Labs  Lab 05/31/22 0347 05/31/22 1653 06/01/22 0313 06/02/22 0244 06/05/22 0646  WBC 4.9  --  6.1 5.3 5.2  HGB 6.5* 10.2* 9.1* 10.5* 10.6*   HCT 19.7* 29.9* 26.8* 31.9* 31.2*  MCV 87.6  --  85.9 87.6 86.2  PLT 143*  --  171 190 161    Cardiac Enzymes: No results for input(s): "CKTOTAL", "CKMB", "CKMBINDEX", "TROPONINI" in the last 168 hours.  BNP (last 3 results) Recent Labs    04/08/22 0336  BNP 51.6    ProBNP (last 3 results) No results for input(s): "PROBNP" in the last 8760 hours.  Radiological Exams: No results found.  Assessment/Plan Active Problems:   Sickle cell trait (HCC)   Acute on chronic respiratory failure with hypoxia (HCC)   Pneumonia of right lung due to Pseudomonas species (HCC)   Pleural effusion, right   Tracheostomy dependence (HCC)   Acute on chronic respiratory failure hypoxia-has a new baseline of 16 hours ATC with 8 hours of full support.  She remains on full support this morning as our time frames are slightly off, we will work on getting her 8-hour full support readjusted to nocturnal hours.  Continue with supportive care. Sickle cell trait-no change we will continue to follow along closely. Pleural effusion -none remain present on last chest film.  Continue to get new films as clinically indicated. Tracheostomy-remains in place and is stable.  Continue with trach care per protocol. Pneumonia-has been treated we will continue to monitor.   I have personally seen and evaluated the patient, evaluated laboratory and imaging results, formulated the assessment and plan and placed orders.  The Patient requires high complexity decision making with multiple systems involvement.  Rounds were done with the Respiratory Therapy Director and Staff therapists and discussed with nursing staff also.  Allyne Gee, MD [facility] Pulmonary Critical Care Medicine Sleep Medicine

## 2022-06-07 ENCOUNTER — Encounter (HOSPITAL_BASED_OUTPATIENT_CLINIC_OR_DEPARTMENT_OTHER): Payer: Self-pay

## 2022-06-07 DIAGNOSIS — R52 Pain, unspecified: Secondary | ICD-10-CM

## 2022-06-07 NOTE — Progress Notes (Signed)
Right lower extremity venous duplex completed. Refer to "CV Proc" under chart review to view preliminary results.  06/07/2022 4:13 PM Kelby Aline., MHA, RVT, RDCS, RDMS

## 2022-06-08 ENCOUNTER — Other Ambulatory Visit (HOSPITAL_COMMUNITY): Payer: Self-pay

## 2022-06-08 LAB — CBC
HCT: 28.3 % — ABNORMAL LOW (ref 36.0–46.0)
Hemoglobin: 9.3 g/dL — ABNORMAL LOW (ref 12.0–15.0)
MCH: 28.9 pg (ref 26.0–34.0)
MCHC: 32.9 g/dL (ref 30.0–36.0)
MCV: 87.9 fL (ref 80.0–100.0)
Platelets: 200 10*3/uL (ref 150–400)
RBC: 3.22 MIL/uL — ABNORMAL LOW (ref 3.87–5.11)
RDW: 15 % (ref 11.5–15.5)
WBC: 5 10*3/uL (ref 4.0–10.5)
nRBC: 0 % (ref 0.0–0.2)

## 2022-06-08 LAB — BASIC METABOLIC PANEL
Anion gap: 10 (ref 5–15)
BUN: 27 mg/dL — ABNORMAL HIGH (ref 8–23)
CO2: 30 mmol/L (ref 22–32)
Calcium: 9.9 mg/dL (ref 8.9–10.3)
Chloride: 97 mmol/L — ABNORMAL LOW (ref 98–111)
Creatinine, Ser: 0.6 mg/dL (ref 0.44–1.00)
GFR, Estimated: >60 mL/min (ref >60)
Glucose, Bld: 109 mg/dL — ABNORMAL HIGH (ref 70–99)
Potassium: 4.1 mmol/L (ref 3.5–5.1)
Sodium: 137 mmol/L (ref 135–145)

## 2022-06-08 LAB — MAGNESIUM: Magnesium: 2 mg/dL (ref 1.7–2.4)

## 2022-06-08 NOTE — Progress Notes (Cosign Needed)
Pulmonary [place]   PULMONARY CRITICAL CARE SERVICE  PROGRESS NOTE     Sheila Fernandez  OIZ:[id number]  DOB: October 26, 1950   DOA: 05/26/2022  Referring Physician: Satira Sark, MD  HPI: Sheila Fernandez is a 71 y.o. female being followed for ventilator/airway/oxygen weaning Acute on Chronic Respiratory Failure.  Patient seen sitting up in bed this morning, currently on ATC 28%.  Medications: Reviewed on Rounds  Physical Exam:  Vitals: Temp 98.2, pulse 99, respirations 28, BP 193/64, SPO2 95%  Ventilator Settings ATC 28%  General: Comfortable at this time Neck: supple Cardiovascular: no malignant arrhythmias Respiratory: Bilaterally diminished Skin: no rash seen on limited exam Musculoskeletal: No gross abnormality Psychiatric:unable to assess Neurologic:no involuntary movements         Lab Data:   Basic Metabolic Panel: Recent Labs  Lab 06/05/22 0646 06/08/22 0314  NA 133* 137  K 4.3 4.1  CL 95* 97*  CO2 27 30  GLUCOSE 127* 109*  BUN 20 27*  CREATININE 0.49 0.60  CALCIUM 9.2 9.9  MG  --  2.0    ABG: No results for input(s): "PHART", "PCO2ART", "PO2ART", "HCO3", "O2SAT" in the last 168 hours.  Liver Function Tests: No results for input(s): "AST", "ALT", "ALKPHOS", "BILITOT", "PROT", "ALBUMIN" in the last 168 hours. No results for input(s): "LIPASE", "AMYLASE" in the last 168 hours. No results for input(s): "AMMONIA" in the last 168 hours.  CBC: Recent Labs  Lab 06/02/22 0244 06/05/22 0646 06/08/22 0314  WBC 5.3 5.2 5.0  HGB 10.5* 10.6* 9.3*  HCT 31.9* 31.2* 28.3*  MCV 87.6 86.2 87.9  PLT 190 161 200    Cardiac Enzymes: No results for input(s): "CKTOTAL", "CKMB", "CKMBINDEX", "TROPONINI" in the last 168 hours.  BNP (last 3 results) Recent Labs    04/08/22 0336  BNP 51.6    ProBNP (last 3 results) No results for input(s): "PROBNP" in the last 8760 hours.  Radiological Exams: DG  Chest Port 1 View  Result Date: 06/08/2022 CLINICAL DATA:  Hypoxia. Tracheostomy. EXAM: PORTABLE CHEST 1 VIEW COMPARISON:  05/27/2022 FINDINGS: Tracheostomy tube again seen in appropriate position. Heart size is within normal limits. Low lung volumes and bibasilar atelectasis or infiltrates show no significant change. IMPRESSION: Stable low lung volumes and bibasilar atelectasis versus infiltrates. Electronically Signed   By: Marlaine Hind M.D.   On: 06/08/2022 14:41   VAS [place] LOWER EXTREMITY VENOUS (DVT)  Result Date: 06/07/2022  Lower Venous DVT Study Patient Name:  Sheila Fernandez  Date of Exam:   06/07/2022 Medical Rec #: [id number]            Accession #:    [id number] Date of Birth: 1951/02/03            Patient Gender: F Patient Age:   71 years Exam Location:  [facility] Procedure:      VAS [place] LOWER EXTREMITY VENOUS (DVT) Referring Phys: Satira Sark --------------------------------------------------------------------------------  Indications: Pain.  Comparison Study: No prior study Performing Technologist: Maudry Mayhew MHA, RDMS, RVT, RDCS  Examination Guidelines: A complete evaluation includes B-mode imaging, spectral Doppler, color Doppler, and power Doppler as needed of all accessible portions of each vessel. Bilateral testing is considered an integral part of a complete examination. Limited examinations for reoccurring indications may be performed as noted. The reflux portion of the exam is performed with the patient in reverse Trendelenburg.  +---------+---------------+---------+-----------+----------+--------------+ RIGHT    CompressibilityPhasicitySpontaneityPropertiesThrombus Aging +---------+---------------+---------+-----------+----------+--------------+ CFV  Full           Yes      Yes                                 +---------+---------------+---------+-----------+----------+--------------+ SFJ      Full                                                         +---------+---------------+---------+-----------+----------+--------------+ FV Prox  Full                                                        +---------+---------------+---------+-----------+----------+--------------+ FV Mid   Full                                                        +---------+---------------+---------+-----------+----------+--------------+ FV DistalFull                                                        +---------+---------------+---------+-----------+----------+--------------+ PFV      Full                                                        +---------+---------------+---------+-----------+----------+--------------+ POP      Full           Yes      Yes                                 +---------+---------------+---------+-----------+----------+--------------+ PTV      Full                                                        +---------+---------------+---------+-----------+----------+--------------+ PERO     Full                                                        +---------+---------------+---------+-----------+----------+--------------+   +----+---------------+---------+-----------+----------+--------------+ LEFTCompressibilityPhasicitySpontaneityPropertiesThrombus Aging +----+---------------+---------+-----------+----------+--------------+ CFV Full           Yes      Yes                                 +----+---------------+---------+-----------+----------+--------------+  Summary: RIGHT: - There is no evidence of deep vein thrombosis in the lower extremity.  - No cystic structure found in the popliteal fossa.  LEFT: - No evidence of common femoral vein obstruction.  *See table(s) above for measurements and observations.    Preliminary     Assessment/Plan Active Problems:   Sickle cell trait (HCC)   Acute on chronic respiratory failure with hypoxia (HCC)   Pneumonia of right lung due to Pseudomonas  species (HCC)   Pleural effusion, right   Tracheostomy dependence (HCC)   Acute on chronic respiratory failure hypoxia-has a new baseline of 16 hours ATC with 8 hours of full support.  She remains on full support this morning as our time frames are slightly off, we will work on getting her 8-hour full support readjusted to nocturnal hours.  Continue with supportive care. Sickle cell trait-no change we will continue to follow along closely. Pleural effusion -none remain present on last chest film.  Continue to get new films as clinically indicated. Tracheostomy-remains in place and is stable.  Continue with trach care per protocol. Pneumonia-has been treated we will continue to monitor.    I have personally seen and evaluated the patient, evaluated laboratory and imaging results, formulated the assessment and plan and placed orders. The Patient requires high complexity decision making with multiple systems involvement.  Rounds were done with the Respiratory Therapy Director and Staff therapists and discussed with nursing staff also.  Allyne Gee, MD [facility] Pulmonary Critical Care Medicine Sleep Medicine

## 2022-06-08 NOTE — Progress Notes (Cosign Needed)
Pulmonary [place]   PULMONARY CRITICAL CARE SERVICE  PROGRESS NOTE     Sheila Fernandez  LEX:[id number]  DOB: Mar 22, 1951   DOA: 05/26/2022  Referring Physician: Satira Sark, MD  HPI: Sheila Fernandez is a 71 y.o. female being followed for ventilator/airway/oxygen weaning Acute on Chronic Respiratory Failure.  Patient seen lying in bed, currently on ATC 28% which is her baseline.  Does appear to be very anxious this morning.  Medications: Reviewed on Rounds  Physical Exam:  Vitals: Temp 97.8, pulse 94, respirations 20, BP 98/63, SPO2 100%  Ventilator Settings ATC 28%  General: Comfortable at this time Neck: supple Cardiovascular: no malignant arrhythmias Respiratory: Bilaterally diminished Skin: no rash seen on limited exam Musculoskeletal: No gross abnormality Psychiatric:unable to assess Neurologic:no involuntary movements         Lab Data:   Basic Metabolic Panel: Recent Labs  Lab 06/05/22 0646 06/08/22 0314  NA 133* 137  K 4.3 4.1  CL 95* 97*  CO2 27 30  GLUCOSE 127* 109*  BUN 20 27*  CREATININE 0.49 0.60  CALCIUM 9.2 9.9  MG  --  2.0    ABG: No results for input(s): "PHART", "PCO2ART", "PO2ART", "HCO3", "O2SAT" in the last 168 hours.  Liver Function Tests: No results for input(s): "AST", "ALT", "ALKPHOS", "BILITOT", "PROT", "ALBUMIN" in the last 168 hours. No results for input(s): "LIPASE", "AMYLASE" in the last 168 hours. No results for input(s): "AMMONIA" in the last 168 hours.  CBC: Recent Labs  Lab 06/02/22 0244 06/05/22 0646 06/08/22 0314  WBC 5.3 5.2 5.0  HGB 10.5* 10.6* 9.3*  HCT 31.9* 31.2* 28.3*  MCV 87.6 86.2 87.9  PLT 190 161 200    Cardiac Enzymes: No results for input(s): "CKTOTAL", "CKMB", "CKMBINDEX", "TROPONINI" in the last 168 hours.  BNP (last 3 results) Recent Labs    04/08/22 0336  BNP 51.6    ProBNP (last 3 results) No results for input(s): "PROBNP" in  the last 8760 hours.  Radiological Exams: DG Chest Port 1 View  Result Date: 06/08/2022 CLINICAL DATA:  Hypoxia. Tracheostomy. EXAM: PORTABLE CHEST 1 VIEW COMPARISON:  05/27/2022 FINDINGS: Tracheostomy tube again seen in appropriate position. Heart size is within normal limits. Low lung volumes and bibasilar atelectasis or infiltrates show no significant change. IMPRESSION: Stable low lung volumes and bibasilar atelectasis versus infiltrates. Electronically Signed   By: Marlaine Hind M.D.   On: 06/08/2022 14:41   VAS [place] LOWER EXTREMITY VENOUS (DVT)  Result Date: 06/07/2022  Lower Venous DVT Study Patient Name:  MARLANA MCKOWEN  Date of Exam:   06/07/2022 Medical Rec #: [id number]            Accession #:    [id number] Date of Birth: 1951/07/03            Patient Gender: F Patient Age:   42 years Exam Location:  [facility] Procedure:      VAS [place] LOWER EXTREMITY VENOUS (DVT) Referring Phys: Satira Sark --------------------------------------------------------------------------------  Indications: Pain.  Comparison Study: No prior study Performing Technologist: Maudry Mayhew MHA, RDMS, RVT, RDCS  Examination Guidelines: A complete evaluation includes B-mode imaging, spectral Doppler, color Doppler, and power Doppler as needed of all accessible portions of each vessel. Bilateral testing is considered an integral part of a complete examination. Limited examinations for reoccurring indications may be performed as noted. The reflux portion of the exam is performed with the patient in reverse Trendelenburg.  +---------+---------------+---------+-----------+----------+--------------+ RIGHT  CompressibilityPhasicitySpontaneityPropertiesThrombus Aging +---------+---------------+---------+-----------+----------+--------------+ CFV      Full           Yes      Yes                                 +---------+---------------+---------+-----------+----------+--------------+ SFJ      Full                                                         +---------+---------------+---------+-----------+----------+--------------+ FV Prox  Full                                                        +---------+---------------+---------+-----------+----------+--------------+ FV Mid   Full                                                        +---------+---------------+---------+-----------+----------+--------------+ FV DistalFull                                                        +---------+---------------+---------+-----------+----------+--------------+ PFV      Full                                                        +---------+---------------+---------+-----------+----------+--------------+ POP      Full           Yes      Yes                                 +---------+---------------+---------+-----------+----------+--------------+ PTV      Full                                                        +---------+---------------+---------+-----------+----------+--------------+ PERO     Full                                                        +---------+---------------+---------+-----------+----------+--------------+   +----+---------------+---------+-----------+----------+--------------+ LEFTCompressibilityPhasicitySpontaneityPropertiesThrombus Aging +----+---------------+---------+-----------+----------+--------------+ CFV Full           Yes      Yes                                 +----+---------------+---------+-----------+----------+--------------+  Summary: RIGHT: - There is no evidence of deep vein thrombosis in the lower extremity.  - No cystic structure found in the popliteal fossa.  LEFT: - No evidence of common femoral vein obstruction.  *See table(s) above for measurements and observations.    Preliminary     Assessment/Plan Active Problems:   Sickle cell trait (HCC)   Acute on chronic respiratory failure with hypoxia  (HCC)   Pneumonia of right lung due to Pseudomonas species (HCC)   Pleural effusion, right   Tracheostomy dependence (HCC)  Acute on chronic respiratory failure hypoxia-has a new baseline of 16 hours ATC with 8 hours of full support.  She remains on full support this morning as our time frames are slightly off, we will work on getting her 8-hour full support readjusted to nocturnal hours.  Continue with supportive care. Sickle cell trait-no change we will continue to follow along closely. Pleural effusion -none remain present on last chest film.  Continue to get new films as clinically indicated. Tracheostomy-remains in place and is stable.  Continue with trach care per protocol. Pneumonia-has been treated we will continue to monitor.     I have personally seen and evaluated the patient, evaluated laboratory and imaging results, formulated the assessment and plan and placed orders. The Patient requires high complexity decision making with multiple systems involvement.  Rounds were done with the Respiratory Therapy Director and Staff therapists and discussed with nursing staff also.  Allyne Gee, MD [facility] Pulmonary Critical Care Medicine Sleep Medicine

## 2022-06-08 NOTE — Progress Notes (Cosign Needed)
Pulmonary [place]   PULMONARY CRITICAL CARE SERVICE  PROGRESS NOTE     JAELYNNE HOCKLEY  ZSW:[id number]  DOB: 06/27/1951   DOA: 05/26/2022  Referring Physician: Satira Sark, MD  HPI: Sheila Fernandez is a 71 y.o. female being followed for ventilator/airway/oxygen weaning Acute on Chronic Respiratory Failure.  Patient seen lying in bed, remains on her baseline of 16 hours ATC with 8 hours of full support at night.  She is currently on ATC 20%.  Remains very anxious.  Medications: Reviewed on Rounds  Physical Exam:  Vitals: Temp 97.3, pulse 73, respirations 20, BP 120/79, SPO2 99%  Ventilator Settings ATC 28%  General: Comfortable at this time Neck: supple Cardiovascular: no malignant arrhythmias Respiratory: Bilaterally diminished Skin: no rash seen on limited exam Musculoskeletal: No gross abnormality Psychiatric:unable to assess Neurologic:no involuntary movements         Lab Data:   Basic Metabolic Panel: Recent Labs  Lab 06/05/22 0646 06/08/22 0314  NA 133* 137  K 4.3 4.1  CL 95* 97*  CO2 27 30  GLUCOSE 127* 109*  BUN 20 27*  CREATININE 0.49 0.60  CALCIUM 9.2 9.9  MG  --  2.0    ABG: No results for input(s): "PHART", "PCO2ART", "PO2ART", "HCO3", "O2SAT" in the last 168 hours.  Liver Function Tests: No results for input(s): "AST", "ALT", "ALKPHOS", "BILITOT", "PROT", "ALBUMIN" in the last 168 hours. No results for input(s): "LIPASE", "AMYLASE" in the last 168 hours. No results for input(s): "AMMONIA" in the last 168 hours.  CBC: Recent Labs  Lab 06/02/22 0244 06/05/22 0646 06/08/22 0314  WBC 5.3 5.2 5.0  HGB 10.5* 10.6* 9.3*  HCT 31.9* 31.2* 28.3*  MCV 87.6 86.2 87.9  PLT 190 161 200    Cardiac Enzymes: No results for input(s): "CKTOTAL", "CKMB", "CKMBINDEX", "TROPONINI" in the last 168 hours.  BNP (last 3 results) Recent Labs    04/08/22 0336  BNP 51.6    ProBNP (last 3  results) No results for input(s): "PROBNP" in the last 8760 hours.  Radiological Exams: DG Chest Port 1 View  Result Date: 06/08/2022 CLINICAL DATA:  Hypoxia. Tracheostomy. EXAM: PORTABLE CHEST 1 VIEW COMPARISON:  05/27/2022 FINDINGS: Tracheostomy tube again seen in appropriate position. Heart size is within normal limits. Low lung volumes and bibasilar atelectasis or infiltrates show no significant change. IMPRESSION: Stable low lung volumes and bibasilar atelectasis versus infiltrates. Electronically Signed   By: Marlaine Hind M.D.   On: 06/08/2022 14:41   VAS [place] LOWER EXTREMITY VENOUS (DVT)  Result Date: 06/07/2022  Lower Venous DVT Study Patient Name:  CORNIE MCCOMBER  Date of Exam:   06/07/2022 Medical Rec #: [id number]            Accession #:    [id number] Date of Birth: 1951/02/25            Patient Gender: F Patient Age:   1 years Exam Location:  [facility] Procedure:      VAS [place] LOWER EXTREMITY VENOUS (DVT) Referring Phys: Satira Sark --------------------------------------------------------------------------------  Indications: Pain.  Comparison Study: No prior study Performing Technologist: Maudry Mayhew MHA, RDMS, RVT, RDCS  Examination Guidelines: A complete evaluation includes B-mode imaging, spectral Doppler, color Doppler, and power Doppler as needed of all accessible portions of each vessel. Bilateral testing is considered an integral part of a complete examination. Limited examinations for reoccurring indications may be performed as noted. The reflux portion of the exam is performed  with the patient in reverse Trendelenburg.  +---------+---------------+---------+-----------+----------+--------------+ RIGHT    CompressibilityPhasicitySpontaneityPropertiesThrombus Aging +---------+---------------+---------+-----------+----------+--------------+ CFV      Full           Yes      Yes                                  +---------+---------------+---------+-----------+----------+--------------+ SFJ      Full                                                        +---------+---------------+---------+-----------+----------+--------------+ FV Prox  Full                                                        +---------+---------------+---------+-----------+----------+--------------+ FV Mid   Full                                                        +---------+---------------+---------+-----------+----------+--------------+ FV DistalFull                                                        +---------+---------------+---------+-----------+----------+--------------+ PFV      Full                                                        +---------+---------------+---------+-----------+----------+--------------+ POP      Full           Yes      Yes                                 +---------+---------------+---------+-----------+----------+--------------+ PTV      Full                                                        +---------+---------------+---------+-----------+----------+--------------+ PERO     Full                                                        +---------+---------------+---------+-----------+----------+--------------+   +----+---------------+---------+-----------+----------+--------------+ LEFTCompressibilityPhasicitySpontaneityPropertiesThrombus Aging +----+---------------+---------+-----------+----------+--------------+ CFV Full           Yes      Yes                                 +----+---------------+---------+-----------+----------+--------------+  Summary: RIGHT: - There is no evidence of deep vein thrombosis in the lower extremity.  - No cystic structure found in the popliteal fossa.  LEFT: - No evidence of common femoral vein obstruction.  *See table(s) above for measurements and observations.    Preliminary      Assessment/Plan Active Problems:   Sickle cell trait (HCC)   Acute on chronic respiratory failure with hypoxia (HCC)   Pneumonia of right lung due to Pseudomonas species (HCC)   Pleural effusion, right   Tracheostomy dependence (HCC)  Acute on chronic respiratory failure hypoxia-has a new baseline of 16 hours ATC with 8 hours of full support.   Continue with supportive care. Sickle cell trait-no change we will continue to follow along closely. Pleural effusion -none remain present on last chest film.  Continue to get new films as clinically indicated. Tracheostomy-remains in place and is stable.  Continue with trach care per protocol. Pneumonia-has been treated we will continue to monitor.     I have personally seen and evaluated the patient, evaluated laboratory and imaging results, formulated the assessment and plan and placed orders. The Patient requires high complexity decision making with multiple systems involvement.  Rounds were done with the Respiratory Therapy Director and Staff therapists and discussed with nursing staff also.  Allyne Gee, MD [facility] Pulmonary Critical Care Medicine Sleep Medicine

## 2022-06-10 NOTE — Progress Notes (Addendum)
Pulmonary [place]   PULMONARY CRITICAL CARE SERVICE  PROGRESS NOTE     Sheila Fernandez  HQI:[id number]  DOB: Apr 07, 1951   DOA: 05/26/2022  Referring Physician: Satira Sark, MD  HPI: Sheila Fernandez is a 71 y.o. female being followed for ventilator/airway/oxygen weaning Acute on Chronic Respiratory Failure.  Patient seen lying in bed, currently on ATC 28%.  No overnight events noted.  Medications: Reviewed on Rounds  Physical Exam:  Vitals: Temp 97.6, pulse 74, respirations 18, BP 94/58, SPO2 100%  Ventilator Settings ATC 28%  General: Comfortable at this time Neck: supple Cardiovascular: no malignant arrhythmias Respiratory: Bilaterally diminished Skin: no rash seen on limited exam Musculoskeletal: No gross abnormality Psychiatric:unable to assess Neurologic:no involuntary movements         Lab Data:   Basic Metabolic Panel: Recent Labs  Lab 06/05/22 0646 06/08/22 0314  NA 133* 137  K 4.3 4.1  CL 95* 97*  CO2 27 30  GLUCOSE 127* 109*  BUN 20 27*  CREATININE 0.49 0.60  CALCIUM 9.2 9.9  MG  --  2.0    ABG: No results for input(s): "PHART", "PCO2ART", "PO2ART", "HCO3", "O2SAT" in the last 168 hours.  Liver Function Tests: No results for input(s): "AST", "ALT", "ALKPHOS", "BILITOT", "PROT", "ALBUMIN" in the last 168 hours. No results for input(s): "LIPASE", "AMYLASE" in the last 168 hours. No results for input(s): "AMMONIA" in the last 168 hours.  CBC: Recent Labs  Lab 06/05/22 0646 06/08/22 0314  WBC 5.2 5.0  HGB 10.6* 9.3*  HCT 31.2* 28.3*  MCV 86.2 87.9  PLT 161 200    Cardiac Enzymes: No results for input(s): "CKTOTAL", "CKMB", "CKMBINDEX", "TROPONINI" in the last 168 hours.  BNP (last 3 results) Recent Labs    04/08/22 0336  BNP 51.6    ProBNP (last 3 results) No results for input(s): "PROBNP" in the last 8760 hours.  Radiological Exams: No results  found.  Assessment/Plan Active Problems:   Sickle cell trait (HCC)   Acute on chronic respiratory failure with hypoxia (HCC)   Pneumonia of right lung due to Pseudomonas species (HCC)   Pleural effusion, right   Tracheostomy dependence (HCC)  Acute on chronic respiratory failure hypoxia-has a new baseline of 16 hours ATC with 8 hours of full support.  She remains on full support this morning as our time frames are slightly off, we will work on getting her 8-hour full support readjusted to nocturnal hours.  Continue with supportive care. Sickle cell trait-no change we will continue to follow along closely. Pleural effusion -none remain present on last chest film.  Continue to get new films as clinically indicated. Tracheostomy-remains in place and is stable.  Continue with trach care per protocol. Pneumonia-has been treated we will continue to monitor.   I have personally seen and evaluated the patient, evaluated laboratory and imaging results, formulated the assessment and plan and placed orders. The Patient requires high complexity decision making with multiple systems involvement.  Rounds were done with the Respiratory Therapy Director and Staff therapists and discussed with nursing staff also.  Sheila Gee, MD [facility] Pulmonary Critical Care Medicine Sleep Medicine

## 2022-06-10 NOTE — Progress Notes (Addendum)
Pulmonary [place]   PULMONARY CRITICAL CARE SERVICE  PROGRESS NOTE     AAVA DELAND  [id number]  DOB: 1951/03/02   DOA: 05/26/2022  Referring Physician: Satira Sark, MD  HPI: Sheila Fernandez is a 71 y.o. female being followed for ventilator/airway/oxygen weaning Acute on Chronic Respiratory Failure.  Patient seen lying in bed, currently remains on 16 hours ATC with 8 hours NOC support.  She did have a rapid response yesterday evening requiring her to be placed back on full support.  Unknown if it was more anxiety related versus respiratory.  But currently she is back on ATC.  Medications: Reviewed on Rounds  Physical Exam:  Vitals: Temp 97.5, pulse 89, respirations 27, BP 116/75, SPO2 100%  Ventilator Settings ATC 28%  General: Comfortable at this time Neck: supple Cardiovascular: no malignant arrhythmias Respiratory: Bilaterally diminished Skin: no rash seen on limited exam Musculoskeletal: No gross abnormality Psychiatric:unable to assess Neurologic:no involuntary movements         Lab Data:   Basic Metabolic Panel: Recent Labs  Lab 06/05/22 0646 06/08/22 0314  NA 133* 137  K 4.3 4.1  CL 95* 97*  CO2 27 30  GLUCOSE 127* 109*  BUN 20 27*  CREATININE 0.49 0.60  CALCIUM 9.2 9.9  MG  --  2.0    ABG: No results for input(s): "PHART", "PCO2ART", "PO2ART", "HCO3", "O2SAT" in the last 168 hours.  Liver Function Tests: No results for input(s): "AST", "ALT", "ALKPHOS", "BILITOT", "PROT", "ALBUMIN" in the last 168 hours. No results for input(s): "LIPASE", "AMYLASE" in the last 168 hours. No results for input(s): "AMMONIA" in the last 168 hours.  CBC: Recent Labs  Lab 06/05/22 0646 06/08/22 0314  WBC 5.2 5.0  HGB 10.6* 9.3*  HCT 31.2* 28.3*  MCV 86.2 87.9  PLT 161 200    Cardiac Enzymes: No results for input(s): "CKTOTAL", "CKMB", "CKMBINDEX", "TROPONINI" in the last 168 hours.  BNP  (last 3 results) Recent Labs    04/08/22 0336  BNP 51.6    ProBNP (last 3 results) No results for input(s): "PROBNP" in the last 8760 hours.  Radiological Exams: No results found.  Assessment/Plan Active Problems:   Sickle cell trait (HCC)   Acute on chronic respiratory failure with hypoxia (HCC)   Pneumonia of right lung due to Pseudomonas species (HCC)   Pleural effusion, right   Tracheostomy dependence (HCC)   Acute on chronic respiratory failure hypoxia-has a new baseline of 16 hours ATC with 8 hours of full support.  She remains on full support this morning as our time frames are slightly off, we will work on getting her 8-hour full support readjusted to nocturnal hours.  Continue with supportive care. Sickle cell trait-no change we will continue to follow along closely. Pleural effusion -none remain present on last chest film.  Continue to get new films as clinically indicated. Tracheostomy-remains in place and is stable.  Continue with trach care per protocol. Pneumonia-has been treated we will continue to monitor.  I have personally seen and evaluated the patient, evaluated laboratory and imaging results, formulated the assessment and plan and placed orders. The Patient requires high complexity decision making with multiple systems involvement.  Rounds were done with the Respiratory Therapy Director and Staff therapists and discussed with nursing staff also.  Allyne Gee, MD [facility] Pulmonary Critical Care Medicine Sleep Medicine

## 2022-06-11 LAB — BASIC METABOLIC PANEL
Anion gap: 7 (ref 5–15)
BUN: 25 mg/dL — ABNORMAL HIGH (ref 8–23)
CO2: 32 mmol/L (ref 22–32)
Calcium: 9.9 mg/dL (ref 8.9–10.3)
Chloride: 97 mmol/L — ABNORMAL LOW (ref 98–111)
Creatinine, Ser: 0.56 mg/dL (ref 0.44–1.00)
GFR, Estimated: >60 mL/min (ref >60)
Glucose, Bld: 104 mg/dL — ABNORMAL HIGH (ref 70–99)
Potassium: 4 mmol/L (ref 3.5–5.1)
Sodium: 136 mmol/L (ref 135–145)

## 2022-06-11 LAB — CBC
HCT: 26.6 % — ABNORMAL LOW (ref 36.0–46.0)
Hemoglobin: 8.8 g/dL — ABNORMAL LOW (ref 12.0–15.0)
MCH: 28.9 pg (ref 26.0–34.0)
MCHC: 33.1 g/dL (ref 30.0–36.0)
MCV: 87.2 fL (ref 80.0–100.0)
Platelets: 184 10*3/uL (ref 150–400)
RBC: 3.05 MIL/uL — ABNORMAL LOW (ref 3.87–5.11)
RDW: 14.6 % (ref 11.5–15.5)
WBC: 4.9 10*3/uL (ref 4.0–10.5)
nRBC: 0 % (ref 0.0–0.2)

## 2022-06-11 LAB — MAGNESIUM: Magnesium: 1.8 mg/dL (ref 1.7–2.4)

## 2022-06-11 NOTE — Progress Notes (Addendum)
Pulmonary [place]   PULMONARY CRITICAL CARE SERVICE  PROGRESS NOTE     Sheila Fernandez  [id number]  DOB: [birth year]   DOA: 05/26/2022  Referring Physician: Satira Sark, MD  HPI: Sheila Fernandez is a 71 y.o. female being followed for ventilator/airway/oxygen weaning Acute on Chronic Respiratory Failure.  Patient seen lying in bed, currently on pressure support ventilation.  Her baseline is 16 hours ATC with 8 hours of [place] support until we can get her other underlying conditions under control, most noticeably her anxiety and bradycardia.  Overnight she was not tolerating even pressure support and she was placed back on full support ventilation.  Medications: Reviewed on Rounds  Physical Exam:  Vitals: Temp 98.0, pulse 78, respirations 21, BP 111/75, SPO2 99%  Ventilator Settings ATC 28%  General: Comfortable at this time Neck: supple Cardiovascular: no malignant arrhythmias Respiratory: Bilaterally diminished Skin: no rash seen on limited exam Musculoskeletal: No gross abnormality Psychiatric:unable to assess Neurologic:no involuntary movements         Lab Data:   Basic Metabolic Panel: Recent Labs  Lab 06/05/22 0646 06/08/22 0314 06/11/22 0542  NA 133* 137 136  K 4.3 4.1 4.0  CL 95* 97* 97*  CO2 27 30 32  GLUCOSE 127* 109* 104*  BUN 20 27* 25*  CREATININE 0.49 0.60 0.56  CALCIUM 9.2 9.9 9.9  MG  --  2.0 1.8    ABG: No results for input(s): "PHART", "PCO2ART", "PO2ART", "HCO3", "O2SAT" in the last 168 hours.  Liver Function Tests: No results for input(s): "AST", "ALT", "ALKPHOS", "BILITOT", "PROT", "ALBUMIN" in the last 168 hours. No results for input(s): "LIPASE", "AMYLASE" in the last 168 hours. No results for input(s): "AMMONIA" in the last 168 hours.  CBC: Recent Labs  Lab 06/05/22 0646 06/08/22 0314 06/11/22 0542  WBC 5.2 5.0 4.9  HGB 10.6* 9.3* 8.8*  HCT 31.2* 28.3* 26.6*  MCV 86.2  87.9 87.2  PLT 161 200 184    Cardiac Enzymes: No results for input(s): "CKTOTAL", "CKMB", "CKMBINDEX", "TROPONINI" in the last 168 hours.  BNP (last 3 results) Recent Labs    04/08/22 0336  BNP 51.6    ProBNP (last 3 results) No results for input(s): "PROBNP" in the last 8760 hours.  Radiological Exams: No results found.  Assessment/Plan Active Problems:   Sickle cell trait (HCC)   Acute on chronic respiratory failure with hypoxia (HCC)   Pneumonia of right lung due to Pseudomonas species (HCC)   Pleural effusion, right   Tracheostomy dependence (HCC)   Acute on chronic respiratory failure hypoxia-has a new baseline for now of 16 hours ATC with 8 hours of full support.  Historically after about day 4 she becomes bradycardic.  Her problem does not appear to be respiratory but cardiac.  Once this is optimized she may continue being a candidate for weaning.  Continue with supportive care. Sickle cell trait-no change we will continue to follow along closely. Pleural effusion -none remain present on last chest film.  Continue to get new films as clinically indicated. Tracheostomy-remains in place and is stable.  Continue with trach care per protocol. Pneumonia-has been treated we will continue to monitor.   I have personally seen and evaluated the patient, evaluated laboratory and imaging results, formulated the assessment and plan and placed orders. The Patient requires high complexity decision making with multiple systems involvement.  Rounds were done with the Respiratory Therapy Director and Staff therapists and discussed with nursing  staff also.  Allyne Gee, MD [facility] [place] Pulmonary Critical Care Medicine Sleep Medicine

## 2022-06-12 NOTE — Progress Notes (Signed)
Pulmonary [place]   PULMONARY CRITICAL CARE SERVICE  PROGRESS NOTE     Sheila Fernandez  [id number]  DOB: [birth year]   DOA: 05/26/2022  Referring Physician: Satira Sark, MD  HPI: Sheila Fernandez is a 71 y.o. female being followed for ventilator/airway/oxygen weaning Acute on Chronic Respiratory Failure.  Patient is off the ventilator 16-hour goal during the daytime goes back on the ventilator in the evenings seems to be tolerating it  Medications: Reviewed on Rounds  Physical Exam:  Vitals: Temperature 97.5 pulse 91 respiratory is 14 blood pressure is 149/95 saturations 99%  Ventilator Settings T collar FiO2 28%  General: Comfortable at this time Neck: supple Cardiovascular: no malignant arrhythmias Respiratory: No rhonchi no rales are noted at this time Skin: no rash seen on limited exam Musculoskeletal: No gross abnormality Psychiatric:unable to assess Neurologic:no involuntary movements         Lab Data:   Basic Metabolic Panel: Recent Labs  Lab 06/08/22 0314 06/11/22 0542  NA 137 136  K 4.1 4.0  CL 97* 97*  CO2 30 32  GLUCOSE 109* 104*  BUN 27* 25*  CREATININE 0.60 0.56  CALCIUM 9.9 9.9  MG 2.0 1.8    ABG: No results for input(s): "PHART", "PCO2ART", "PO2ART", "HCO3", "O2SAT" in the last 168 hours.  Liver Function Tests: No results for input(s): "AST", "ALT", "ALKPHOS", "BILITOT", "PROT", "ALBUMIN" in the last 168 hours. No results for input(s): "LIPASE", "AMYLASE" in the last 168 hours. No results for input(s): "AMMONIA" in the last 168 hours.  CBC: Recent Labs  Lab 06/08/22 0314 06/11/22 0542  WBC 5.0 4.9  HGB 9.3* 8.8*  HCT 28.3* 26.6*  MCV 87.9 87.2  PLT 200 184    Cardiac Enzymes: No results for input(s): "CKTOTAL", "CKMB", "CKMBINDEX", "TROPONINI" in the last 168 hours.  BNP (last 3 results) Recent Labs    04/08/22 0336  BNP 51.6    ProBNP (last 3 results) No  results for input(s): "PROBNP" in the last 8760 hours.  Radiological Exams: No results found.  Assessment/Plan Active Problems:   Sickle cell trait (HCC)   Acute on chronic respiratory failure with hypoxia (HCC)   Pneumonia of right lung due to Pseudomonas species (HCC)   Pleural effusion, right   Tracheostomy dependence (HCC)   Acute on chronic respiratory failure with hypoxia patient is on T collar on 28% FiO2 continue to wean Sickle cell trait no change we will continue to follow along Pneumonia supportive care Tracheostomy remains in place Pleural effusion status post drainage   I have personally seen and evaluated the patient, evaluated laboratory and imaging results, formulated the assessment and plan and placed orders. The Patient requires high complexity decision making with multiple systems involvement.  Rounds were done with the Respiratory Therapy Director and Staff therapists and discussed with nursing staff also.  Allyne Gee, MD [facility] Pulmonary Critical Care Medicine Sleep Medicine

## 2022-06-13 NOTE — Progress Notes (Signed)
Pulmonary [place]   PULMONARY CRITICAL CARE SERVICE  PROGRESS NOTE     Sheila Fernandez  [id number]  DOB: 1950-10-07   DOA: 05/26/2022  Referring Physician: Satira Sark, MD  HPI: Sheila Fernandez is a 71 y.o. female being followed for ventilator/airway/oxygen weaning Acute on Chronic Respiratory Failure.  She has been wearing on T collar for 16-hour goal and resting on the ventilator 8 hours.  This morning no fevers are noted  Medications: Reviewed on Rounds  Physical Exam:  Vitals: Temperature is 98.0 pulse 89 respiratory is 19 blood pressure is 108/74 saturations 100%  Ventilator Settings T collar FiO2 is 28%  General: Comfortable at this time Neck: supple Cardiovascular: no malignant arrhythmias Respiratory: Scattered rhonchi expansion is equal Skin: no rash seen on limited exam Musculoskeletal: No gross abnormality Psychiatric:unable to assess Neurologic:no involuntary movements         Lab Data:   Basic Metabolic Panel: Recent Labs  Lab 06/08/22 0314 06/11/22 0542  NA 137 136  K 4.1 4.0  CL 97* 97*  CO2 30 32  GLUCOSE 109* 104*  BUN 27* 25*  CREATININE 0.60 0.56  CALCIUM 9.9 9.9  MG 2.0 1.8    ABG: No results for input(s): "PHART", "PCO2ART", "PO2ART", "HCO3", "O2SAT" in the last 168 hours.  Liver Function Tests: No results for input(s): "AST", "ALT", "ALKPHOS", "BILITOT", "PROT", "ALBUMIN" in the last 168 hours. No results for input(s): "LIPASE", "AMYLASE" in the last 168 hours. No results for input(s): "AMMONIA" in the last 168 hours.  CBC: Recent Labs  Lab 06/08/22 0314 06/11/22 0542  WBC 5.0 4.9  HGB 9.3* 8.8*  HCT 28.3* 26.6*  MCV 87.9 87.2  PLT 200 184    Cardiac Enzymes: No results for input(s): "CKTOTAL", "CKMB", "CKMBINDEX", "TROPONINI" in the last 168 hours.  BNP (last 3 results) Recent Labs    04/08/22 0336  BNP 51.6    ProBNP (last 3 results) No results for  input(s): "PROBNP" in the last 8760 hours.  Radiological Exams: No results found.  Assessment/Plan Active Problems:   Sickle cell trait (HCC)   Acute on chronic respiratory failure with hypoxia (HCC)   Pneumonia of right lung due to Pseudomonas species (HCC)   Pleural effusion, right   Tracheostomy dependence (HCC)   Acute on chronic respiratory failure hypoxia plan is going to be to continue with T-piece on 28% FiO2 goal of 16 hours Sickle cell trait no change we will continue to follow along Pseudomonas pneumonia has been treated Tracheostomy remains in place Pleural effusion no change   I have personally seen and evaluated the patient, evaluated laboratory and imaging results, formulated the assessment and plan and placed orders. The Patient requires high complexity decision making with multiple systems involvement.  Rounds were done with the Respiratory Therapy Director and Staff therapists and discussed with nursing staff also.  Allyne Gee, MD [facility] Pulmonary Critical Care Medicine Sleep Medicine

## 2022-06-14 DIAGNOSIS — D573 Sickle-cell trait: Secondary | ICD-10-CM | POA: Diagnosis not present

## 2022-06-14 DIAGNOSIS — J151 Pneumonia due to Pseudomonas: Secondary | ICD-10-CM | POA: Diagnosis not present

## 2022-06-14 DIAGNOSIS — J9621 Acute and chronic respiratory failure with hypoxia: Secondary | ICD-10-CM | POA: Diagnosis not present

## 2022-06-14 DIAGNOSIS — J90 Pleural effusion, not elsewhere classified: Secondary | ICD-10-CM | POA: Diagnosis not present

## 2022-06-14 NOTE — Progress Notes (Signed)
Pulmonary [place]   PULMONARY CRITICAL CARE SERVICE  PROGRESS NOTE     Sheila Fernandez  [id number]  DOB: Apr 08, 1951   DOA: 05/26/2022  Referring Physician: Satira Sark, MD  HPI: Sheila Fernandez is a 71 y.o. female being followed for ventilator/airway/oxygen weaning Acute on Chronic Respiratory Failure.  Patient is currently on weaning protocol was attempted yesterday on T collar but did not do well  Medications: Reviewed on Rounds  Physical Exam:  Vitals: Temperature is 97.4 pulse 84 respiratory rate 22 blood pressure 180/84 saturations 100%  Ventilator Settings T-piece  General: Comfortable at this time Neck: supple Cardiovascular: no malignant arrhythmias Respiratory: No rhonchi no rales Skin: no rash seen on limited exam Musculoskeletal: No gross abnormality Psychiatric:unable to assess Neurologic:no involuntary movements         Lab Data:   Basic Metabolic Panel: Recent Labs  Lab 06/08/22 0314 06/11/22 0542  NA 137 136  K 4.1 4.0  CL 97* 97*  CO2 30 32  GLUCOSE 109* 104*  BUN 27* 25*  CREATININE 0.60 0.56  CALCIUM 9.9 9.9  MG 2.0 1.8    ABG: No results for input(s): "PHART", "PCO2ART", "PO2ART", "HCO3", "O2SAT" in the last 168 hours.  Liver Function Tests: No results for input(s): "AST", "ALT", "ALKPHOS", "BILITOT", "PROT", "ALBUMIN" in the last 168 hours. No results for input(s): "LIPASE", "AMYLASE" in the last 168 hours. No results for input(s): "AMMONIA" in the last 168 hours.  CBC: Recent Labs  Lab 06/08/22 0314 06/11/22 0542  WBC 5.0 4.9  HGB 9.3* 8.8*  HCT 28.3* 26.6*  MCV 87.9 87.2  PLT 200 184    Cardiac Enzymes: No results for input(s): "CKTOTAL", "CKMB", "CKMBINDEX", "TROPONINI" in the last 168 hours.  BNP (last 3 results) Recent Labs    04/08/22 0336  BNP 51.6    ProBNP (last 3 results) No results for input(s): "PROBNP" in the last 8760  hours.  Radiological Exams: No results found.  Assessment/Plan Active Problems:   Sickle cell trait (HCC)   Acute on chronic respiratory failure with hypoxia (HCC)   Pneumonia of right lung due to Pseudomonas species (HCC)   Pleural effusion, right   Tracheostomy dependence (HCC)   Acute on chronic respiratory failure hypoxia plan is to try to wean again on T-piece yesterday patient did not do well we will continue with supportive care and follow along Pneumonia supportive care we will continue to follow Tracheostomy remains in place Pleural effusion at baseline Sickle cell trait no change   I have personally seen and evaluated the patient, evaluated laboratory and imaging results, formulated the assessment and plan and placed orders. The Patient requires high complexity decision making with multiple systems involvement.  Rounds were done with the Respiratory Therapy Director and Staff therapists and discussed with nursing staff also.  Allyne Gee, MD [place] Pulmonary Critical Care Medicine Sleep Medicine

## 2022-06-15 LAB — CBC
HCT: 29.8 % — ABNORMAL LOW (ref 36.0–46.0)
Hemoglobin: 9.7 g/dL — ABNORMAL LOW (ref 12.0–15.0)
MCH: 28.5 pg (ref 26.0–34.0)
MCHC: 32.6 g/dL (ref 30.0–36.0)
MCV: 87.6 fL (ref 80.0–100.0)
Platelets: 181 10*3/uL (ref 150–400)
RBC: 3.4 MIL/uL — ABNORMAL LOW (ref 3.87–5.11)
RDW: 14.6 % (ref 11.5–15.5)
WBC: 6.5 10*3/uL (ref 4.0–10.5)
nRBC: 0 % (ref 0.0–0.2)

## 2022-06-15 LAB — BASIC METABOLIC PANEL
Anion gap: 9 (ref 5–15)
BUN: 28 mg/dL — ABNORMAL HIGH (ref 8–23)
CO2: 30 mmol/L (ref 22–32)
Calcium: 9.9 mg/dL (ref 8.9–10.3)
Chloride: 96 mmol/L — ABNORMAL LOW (ref 98–111)
Creatinine, Ser: 0.58 mg/dL (ref 0.44–1.00)
GFR, Estimated: >60 mL/min (ref >60)
Glucose, Bld: 118 mg/dL — ABNORMAL HIGH (ref 70–99)
Potassium: 4.5 mmol/L (ref 3.5–5.1)
Sodium: 135 mmol/L (ref 135–145)

## 2022-06-15 NOTE — Progress Notes (Signed)
Pulmonary [place]   PULMONARY CRITICAL CARE SERVICE  PROGRESS NOTE     Sheila Fernandez  [id number]  DOB: 02-Feb-1951   DOA: 05/26/2022  Referring Physician: Satira Sark, MD  HPI: Sheila Fernandez is a 71 y.o. female being followed for ventilator/airway/oxygen weaning Acute on Chronic Respiratory Failure.  Patient is on T collar currently 28% FiO2 the goal is for 16 hours  Medications: Reviewed on Rounds  Physical Exam:  Vitals: Temperature 97.5 pulse 82 respiratory rate 23 blood pressure 127/85 saturations 100%  Ventilator Settings T collar FiO2 28% resting mode assist control FiO2 28% PEEP 5  General: Comfortable at this time Neck: supple Cardiovascular: no malignant arrhythmias Respiratory: Coarse rhonchi noted bilaterally Skin: no rash seen on limited exam Musculoskeletal: No gross abnormality Psychiatric:unable to assess Neurologic:no involuntary movements         Lab Data:   Basic Metabolic Panel: Recent Labs  Lab 06/11/22 0542 06/15/22 0316  NA 136 135  K 4.0 4.5  CL 97* 96*  CO2 32 30  GLUCOSE 104* 118*  BUN 25* 28*  CREATININE 0.56 0.58  CALCIUM 9.9 9.9  MG 1.8  --     ABG: No results for input(s): "PHART", "PCO2ART", "PO2ART", "HCO3", "O2SAT" in the last 168 hours.  Liver Function Tests: No results for input(s): "AST", "ALT", "ALKPHOS", "BILITOT", "PROT", "ALBUMIN" in the last 168 hours. No results for input(s): "LIPASE", "AMYLASE" in the last 168 hours. No results for input(s): "AMMONIA" in the last 168 hours.  CBC: Recent Labs  Lab 06/11/22 0542 06/15/22 0316  WBC 4.9 6.5  HGB 8.8* 9.7*  HCT 26.6* 29.8*  MCV 87.2 87.6  PLT 184 181    Cardiac Enzymes: No results for input(s): "CKTOTAL", "CKMB", "CKMBINDEX", "TROPONINI" in the last 168 hours.  BNP (last 3 results) Recent Labs    04/08/22 0336  BNP 51.6    ProBNP (last 3 results) No results for input(s): "PROBNP" in  the last 8760 hours.  Radiological Exams: No results found.  Assessment/Plan Active Problems:   Sickle cell trait (HCC)   Acute on chronic respiratory failure with hypoxia (HCC)   Pneumonia of right lung due to Pseudomonas species (HCC)   Pleural effusion, right   Tracheostomy dependence (HCC)   Acute on chronic respiratory failure with hypoxia we will continue with the weaning attempt on T collar for 16 hours Sickle cell trait no change we will continue with supportive care Pneumonia due to Pseudomonas has been treated Tracheostomy remains in place Pleural effusion status post drainage   I have personally seen and evaluated the patient, evaluated laboratory and imaging results, formulated the assessment and plan and placed orders. The Patient requires high complexity decision making with multiple systems involvement.  Rounds were done with the Respiratory Therapy Director and Staff therapists and discussed with nursing staff also.  Allyne Gee, MD [facility] Pulmonary Critical Care Medicine Sleep Medicine

## 2022-06-16 NOTE — Progress Notes (Signed)
Pulmonary [place]   PULMONARY CRITICAL CARE SERVICE  PROGRESS NOTE     Sheila Fernandez  [id number]:[id number]  DOB: 06-01-1951   DOA: 05/26/2022  Referring Physician: Satira Sark, MD  HPI: Sheila Fernandez is a 71 y.o. female being followed for ventilator/airway/oxygen weaning Acute on Chronic Respiratory Failure.  Patient currently is on T collar has been on goal of 16 hours resting on assist control mode  Medications: Reviewed on Rounds  Physical Exam:  Vitals: Temperature 97.3 pulse 98 respiratory 21 blood pressure is 115/60 saturations 100%  Ventilator Settings assist-control FiO2 28% tidal volume 400 PEEP 5  General: Comfortable at this time Neck: supple Cardiovascular: no malignant arrhythmias Respiratory: No rhonchi no rales are noted at this time Skin: no rash seen on limited exam Musculoskeletal: No gross abnormality Psychiatric:unable to assess Neurologic:no involuntary movements         Lab Data:   Basic Metabolic Panel: Recent Labs  Lab 06/11/22 0542 06/15/22 0316  NA 136 135  K 4.0 4.5  CL 97* 96*  CO2 32 30  GLUCOSE 104* 118*  BUN 25* 28*  CREATININE 0.56 0.58  CALCIUM 9.9 9.9  MG 1.8  --     ABG: No results for input(s): "PHART", "PCO2ART", "PO2ART", "HCO3", "O2SAT" in the last 168 hours.  Liver Function Tests: No results for input(s): "AST", "ALT", "ALKPHOS", "BILITOT", "PROT", "ALBUMIN" in the last 168 hours. No results for input(s): "LIPASE", "AMYLASE" in the last 168 hours. No results for input(s): "AMMONIA" in the last 168 hours.  CBC: Recent Labs  Lab 06/11/22 0542 06/15/22 0316  WBC 4.9 6.5  HGB 8.8* 9.7*  HCT 26.6* 29.8*  MCV 87.2 87.6  PLT 184 181    Cardiac Enzymes: No results for input(s): "CKTOTAL", "CKMB", "CKMBINDEX", "TROPONINI" in the last 168 hours.  BNP (last 3 results) Recent Labs    04/08/22 0336  BNP 51.6    ProBNP (last 3 results) No results for  input(s): "PROBNP" in the last 8760 hours.  Radiological Exams: No results found.  Assessment/Plan Active Problems:   Sickle cell trait (HCC)   Acute on chronic respiratory failure with hypoxia (HCC)   Pneumonia of right lung due to Pseudomonas species (HCC)   Pleural effusion, right   Tracheostomy dependence (HCC)   Acute on chronic respiratory failure hypoxia plan is to wean goal of 16 hours on T collar Sickle cell trait overall no change we will continue to monitor Pneumonia due to Pseudomonas has been treated Tracheostomy remains in place Lower effusion supportive care   I have personally seen and evaluated the patient, evaluated laboratory and imaging results, formulated the assessment and plan and placed orders. The Patient requires high complexity decision making with multiple systems involvement.  Rounds were done with the Respiratory Therapy Director and Staff therapists and discussed with nursing staff also.  Allyne Gee, MD [facility] Pulmonary Critical Care Medicine Sleep Medicine

## 2022-06-18 ENCOUNTER — Other Ambulatory Visit (HOSPITAL_COMMUNITY): Payer: Self-pay

## 2022-06-18 NOTE — Progress Notes (Signed)
Pulmonary Sheila Fernandez   PULMONARY CRITICAL CARE SERVICE  PROGRESS NOTE     Sheila Fernandez  [id number]:[id number]  DOB: 17-Apr-1951   DOA: 05/26/2022  Referring Physician: Satira Sark, MD  HPI: Sheila Fernandez is a 71 y.o. female being followed for ventilator/airway/oxygen weaning Acute on Chronic Respiratory Failure.  Patient is afebrile resting comfortably without distress has been on T collar currently on 28% FiO2  Medications: Reviewed on Rounds  Physical Exam:  Vitals: Temperature is 96.7 pulse of 72 respiratory rate is 15 blood pressure 95/60 saturations 100%  Ventilator Settings currently on T collar FiO2 28%  General: Comfortable at this time Neck: supple Cardiovascular: no malignant arrhythmias Respiratory: No rhonchi no rales are noted at this time Skin: no rash seen on limited exam Musculoskeletal: No gross abnormality Psychiatric:unable to assess Neurologic:no involuntary movements         Lab Data:   Basic Metabolic Panel: Recent Labs  Lab 06/15/22 0316  NA 135  K 4.5  CL 96*  CO2 30  GLUCOSE 118*  BUN 28*  CREATININE 0.58  CALCIUM 9.9    ABG: No results for input(s): "PHART", "PCO2ART", "PO2ART", "HCO3", "O2SAT" in the last 168 hours.  Liver Function Tests: No results for input(s): "AST", "ALT", "ALKPHOS", "BILITOT", "PROT", "ALBUMIN" in the last 168 hours. No results for input(s): "LIPASE", "AMYLASE" in the last 168 hours. No results for input(s): "AMMONIA" in the last 168 hours.  CBC: Recent Labs  Lab 06/15/22 0316  WBC 6.5  HGB 9.7*  HCT 29.8*  MCV 87.6  PLT 181    Cardiac Enzymes: No results for input(s): "CKTOTAL", "CKMB", "CKMBINDEX", "TROPONINI" in the last 168 hours.  BNP (last 3 results) Recent Labs    04/08/22 0336  BNP 51.6    ProBNP (last 3 results) No results for input(s): "PROBNP" in the last 8760 hours.  Radiological Exams: No results  found.  Assessment/Plan Active Problems:   Sickle cell trait (HCC)   Acute on chronic respiratory failure with hypoxia (HCC)   Pneumonia of right lung due to Pseudomonas species (HCC)   Pleural effusion, right   Tracheostomy dependence (HCC)   Acute on chronic respiratory failure hypoxia we will continue with T collar goal of 16 hours continue secretion management supportive care Sickle cell trait no change we will continue to follow along Tracheostomy remains in place Pneumonia due to Pseudomonas has been treated Pleural effusion supportive care   I have personally seen and evaluated the patient, evaluated laboratory and imaging results, formulated the assessment and plan and placed orders. The Patient requires high complexity decision making with multiple systems involvement.  Rounds were done with the Respiratory Therapy Director and Staff therapists and discussed with nursing staff also.  Allyne Gee, MD [facility] Pulmonary Critical Care Medicine Sleep Medicine

## 2022-06-19 ENCOUNTER — Encounter (HOSPITAL_COMMUNITY): Payer: Self-pay

## 2022-06-20 NOTE — Progress Notes (Incomplete)
Pulmonary [place]   PULMONARY CRITICAL CARE SERVICE  PROGRESS NOTE     Sheila Fernandez  [id number]:[id number]  DOB: 1951/01/15   DOA: 05/26/2022  Referring Physician: Satira Sark, MD  HPI: Sheila Fernandez is a 71 y.o. female being followed for ventilator/airway/oxygen weaning Acute on Chronic Respiratory Failure. ***  Medications: Reviewed on Rounds  Physical Exam:  Vitals: ***  Ventilator Settings ***  General: Comfortable at this time Neck: supple Cardiovascular: no malignant arrhythmias Respiratory: *** Skin: no rash seen on limited exam Musculoskeletal: No gross abnormality Psychiatric:unable to assess Neurologic:no involuntary movements         Lab Data:   Basic Metabolic Panel: Recent Labs  Lab 06/15/22 0316  NA 135  K 4.5  CL 96*  CO2 30  GLUCOSE 118*  BUN 28*  CREATININE 0.58  CALCIUM 9.9    ABG: No results for input(s): "PHART", "PCO2ART", "PO2ART", "HCO3", "O2SAT" in the last 168 hours.  Liver Function Tests: No results for input(s): "AST", "ALT", "ALKPHOS", "BILITOT", "PROT", "ALBUMIN" in the last 168 hours. No results for input(s): "LIPASE", "AMYLASE" in the last 168 hours. No results for input(s): "AMMONIA" in the last 168 hours.  CBC: Recent Labs  Lab 06/15/22 0316  WBC 6.5  HGB 9.7*  HCT 29.8*  MCV 87.6  PLT 181    Cardiac Enzymes: No results for input(s): "CKTOTAL", "CKMB", "CKMBINDEX", "TROPONINI" in the last 168 hours.  BNP (last 3 results) Recent Labs    04/08/22 0336  BNP 51.6    ProBNP (last 3 results) No results for input(s): "PROBNP" in the last 8760 hours.  Radiological Exams: DG Os Calcis Right  Result Date: 06/18/2022 CLINICAL DATA:  Right foot pain. EXAM: RIGHT OS CALCIS - 2+ VIEW COMPARISON:  None Available. FINDINGS: Evaluation is limited due to positioning. No acute fracture or dislocation identified. The bones are osteopenic. The soft tissues are  grossly unremarkable. IMPRESSION: No acute findings. Electronically Signed   By: Anner Crete M.D.   On: 06/18/2022 19:09    Assessment/Plan Active Problems:   Sickle cell trait (HCC)   Acute on chronic respiratory failure with hypoxia (HCC)   Pneumonia of right lung due to Pseudomonas species (HCC)   Pleural effusion, right   Tracheostomy dependence (HCC)   Acute on chronic respiratory failure hypoxia-we will continue with T collar goal of 16 hours with 8 hours full support at night.  Continue secretion management supportive care. Sickle cell trait-no change.  We will continue to follow along. Tracheostomy-remains in place and is stable.  Continue with trach care per protocol. Pneumonia due to Pseudomonas -has been treated. Pleural effusion-last chest film free of effusion.  Continue with supportive care and follow-up x-rays as clinically indicated.   I have personally seen and evaluated the patient, evaluated laboratory and imaging results, formulated the assessment and plan and placed orders. The Patient requires high complexity decision making with multiple systems involvement.  Rounds were done with the Respiratory Therapy Director and Staff therapists and discussed with nursing staff also.  Allyne Gee, MD [facility] Pulmonary Critical Care Medicine Sleep Medicine

## 2022-06-20 NOTE — Progress Notes (Addendum)
Pulmonary [place]   PULMONARY CRITICAL CARE SERVICE  PROGRESS NOTE     Sheila Fernandez  [id number]:[id number]  DOB: 1951-09-19   DOA: 05/26/2022  Referring Physician: Satira Sark, MD  HPI: Sheila Fernandez is a 71 y.o. female being followed for ventilator/airway/oxygen weaning Acute on Chronic Respiratory Failure.  Patient seen lying in bed, remains on baseline ATC for 16 hours with vent support at night.  Medications: Reviewed on Rounds  Physical Exam:  Vitals: Temp 96.9, pulse 69, respirations 15, BP 101/65, SPO2 100%  Ventilator Settings ATC 28%  General: Comfortable at this time Neck: supple Cardiovascular: no malignant arrhythmias Respiratory: Diminished bilaterally Skin: no rash seen on limited exam Musculoskeletal: No gross abnormality Psychiatric:unable to assess Neurologic:no involuntary movements         Lab Data:   Basic Metabolic Panel: Recent Labs  Lab 06/15/22 0316  NA 135  K 4.5  CL 96*  CO2 30  GLUCOSE 118*  BUN 28*  CREATININE 0.58  CALCIUM 9.9    ABG: No results for input(s): "PHART", "PCO2ART", "PO2ART", "HCO3", "O2SAT" in the last 168 hours.  Liver Function Tests: No results for input(s): "AST", "ALT", "ALKPHOS", "BILITOT", "PROT", "ALBUMIN" in the last 168 hours. No results for input(s): "LIPASE", "AMYLASE" in the last 168 hours. No results for input(s): "AMMONIA" in the last 168 hours.  CBC: Recent Labs  Lab 06/15/22 0316  WBC 6.5  HGB 9.7*  HCT 29.8*  MCV 87.6  PLT 181    Cardiac Enzymes: No results for input(s): "CKTOTAL", "CKMB", "CKMBINDEX", "TROPONINI" in the last 168 hours.  BNP (last 3 results) Recent Labs    04/08/22 0336  BNP 51.6    ProBNP (last 3 results) No results for input(s): "PROBNP" in the last 8760 hours.  Radiological Exams: DG Os Calcis Right  Result Date: 06/18/2022 CLINICAL DATA:  Right foot pain. EXAM: RIGHT OS CALCIS - 2+ VIEW  COMPARISON:  None Available. FINDINGS: Evaluation is limited due to positioning. No acute fracture or dislocation identified. The bones are osteopenic. The soft tissues are grossly unremarkable. IMPRESSION: No acute findings. Electronically Signed   By: Anner Crete M.D.   On: 06/18/2022 19:09    Assessment/Plan Active Problems:   Sickle cell trait (HCC)   Acute on chronic respiratory failure with hypoxia (HCC)   Pneumonia of right lung due to Pseudomonas species (HCC)   Pleural effusion, right   Tracheostomy dependence (HCC)    Acute on chronic respiratory failure hypoxia-we will continue with T collar goal of 16 hours with 8 hours full support at night.  Continue secretion management supportive care. Sickle cell trait-no change.  We will continue to follow along. Tracheostomy-remains in place and is stable.  Continue with trach care per protocol. Pneumonia due to Pseudomonas -has been treated. Pleural effusion-last chest film free of effusion.  Continue with supportive care and follow-up x-rays as clinically indicated.   I have personally seen and evaluated the patient, evaluated laboratory and imaging results, formulated the assessment and plan and placed orders. The Patient requires high complexity decision making with multiple systems involvement.  Rounds were done with the Respiratory Therapy Director and Staff therapists and discussed with nursing staff also.  Allyne Gee, MD [facility] Pulmonary Critical Care Medicine Sleep Medicine

## 2022-06-20 NOTE — Progress Notes (Addendum)
Pulmonary [place]   PULMONARY CRITICAL CARE SERVICE  PROGRESS NOTE     Sheila Fernandez  [id number]  DOB: [birth year]   DOA: 05/26/2022  Referring Physician: Satira Sark, MD  HPI: Sheila Fernandez is a 71 y.o. female being followed for ventilator/airway/oxygen weaning Acute on Chronic Respiratory Failure.  Patient seen lying in bed, currently on ATC.  We will continue with her 16 hours of ATC and 8 hours of full support/PSV.  Medications: Reviewed on Rounds  Physical Exam:  Vitals: Temp 97.3, pulse 101, rest rations 25, BP 178/77, SPO2 100%  Ventilator Settings ATC 28%  General: Comfortable at this time Neck: supple Cardiovascular: no malignant arrhythmias Respiratory: Bilaterally diminished Skin: no rash seen on limited exam Musculoskeletal: No gross abnormality Psychiatric:unable to assess Neurologic:no involuntary movements         Lab Data:   Basic Metabolic Panel: Recent Labs  Lab 06/15/22 0316  NA 135  K 4.5  CL 96*  CO2 30  GLUCOSE 118*  BUN 28*  CREATININE 0.58  CALCIUM 9.9    ABG: No results for input(s): "PHART", "PCO2ART", "PO2ART", "HCO3", "O2SAT" in the last 168 hours.  Liver Function Tests: No results for input(s): "AST", "ALT", "ALKPHOS", "BILITOT", "PROT", "ALBUMIN" in the last 168 hours. No results for input(s): "LIPASE", "AMYLASE" in the last 168 hours. No results for input(s): "AMMONIA" in the last 168 hours.  CBC: Recent Labs  Lab 06/15/22 0316  WBC 6.5  HGB 9.7*  HCT 29.8*  MCV 87.6  PLT 181    Cardiac Enzymes: No results for input(s): "CKTOTAL", "CKMB", "CKMBINDEX", "TROPONINI" in the last 168 hours.  BNP (last 3 results) Recent Labs    04/08/22 0336  BNP 51.6    ProBNP (last 3 results) No results for input(s): "PROBNP" in the last 8760 hours.  Radiological Exams: DG Os Calcis Right  Result Date: 06/18/2022 CLINICAL DATA:  Right foot pain. EXAM: RIGHT  OS CALCIS - 2+ VIEW COMPARISON:  None Available. FINDINGS: Evaluation is limited due to positioning. No acute fracture or dislocation identified. The bones are osteopenic. The soft tissues are grossly unremarkable. IMPRESSION: No acute findings. Electronically Signed   By: Anner Crete M.D.   On: 06/18/2022 19:09    Assessment/Plan Active Problems:   Sickle cell trait (HCC)   Acute on chronic respiratory failure with hypoxia (HCC)   Pneumonia of right lung due to Pseudomonas species (HCC)   Pleural effusion, right   Tracheostomy dependence (HCC)  Acute on chronic respiratory failure hypoxia-we will continue with T collar goal of 16 hours with 8 hours full support at night.  Continue secretion management supportive care. Sickle cell trait-no change.  We will continue to follow along. Tracheostomy-remains in place and is stable.  Continue with trach care per protocol. Pneumonia due to Pseudomonas -has been treated. Pleural effusion-last chest film free of effusion.  Continue with supportive care and follow-up x-rays as clinically indicated.   I have personally seen and evaluated the patient, evaluated laboratory and imaging results, formulated the assessment and plan and placed orders. The Patient requires high complexity decision making with multiple systems involvement.  Rounds were done with the Respiratory Therapy Director and Staff therapists and discussed with nursing staff also.  Allyne Gee, MD [facility] Pulmonary Critical Care Medicine Sleep Medicine

## 2022-06-21 LAB — CBC
HCT: 28.6 % — ABNORMAL LOW (ref 36.0–46.0)
Hemoglobin: 9.3 g/dL — ABNORMAL LOW (ref 12.0–15.0)
MCH: 28.9 pg (ref 26.0–34.0)
MCHC: 32.5 g/dL (ref 30.0–36.0)
MCV: 88.8 fL (ref 80.0–100.0)
Platelets: 210 10*3/uL (ref 150–400)
RBC: 3.22 MIL/uL — ABNORMAL LOW (ref 3.87–5.11)
RDW: 14.6 % (ref 11.5–15.5)
WBC: 6.7 10*3/uL (ref 4.0–10.5)
nRBC: 0 % (ref 0.0–0.2)

## 2022-06-21 LAB — BASIC METABOLIC PANEL
Anion gap: 3 — ABNORMAL LOW (ref 5–15)
BUN: 33 mg/dL — ABNORMAL HIGH (ref 8–23)
CO2: 33 mmol/L — ABNORMAL HIGH (ref 22–32)
Calcium: 9.9 mg/dL (ref 8.9–10.3)
Chloride: 98 mmol/L (ref 98–111)
Creatinine, Ser: 0.84 mg/dL (ref 0.44–1.00)
GFR, Estimated: >60 mL/min (ref >60)
Glucose, Bld: 109 mg/dL — ABNORMAL HIGH (ref 70–99)
Potassium: 4.6 mmol/L (ref 3.5–5.1)
Sodium: 134 mmol/L — ABNORMAL LOW (ref 135–145)

## 2022-06-21 NOTE — Progress Notes (Cosign Needed)
Pulmonary [place]   PULMONARY CRITICAL CARE SERVICE  PROGRESS NOTE     Sheila Fernandez  [id number]  DOB: [birth year]   DOA: 05/26/2022  Referring Physician: Satira Sark, MD  HPI: Sheila Fernandez is a 71 y.o. female being followed for ventilator/airway/oxygen weaning Acute on Chronic Respiratory Failure.  Patient seen sitting up in bed, currently remains on ATC 28%.  Did speak with internal medicine team and pulmonology will not be moving forward with reattempting of weaning during this admission.  Patient may benefit from placement where they can continue weaning once her underlying issues have improved.  Medications: Reviewed on Rounds  Physical Exam:  Vitals: Temp 97.1, pulse 72, respirations 16, BP 98/59, SPO2 98%  Ventilator Settings ATC 28%  General: Comfortable at this time Neck: supple Cardiovascular: no malignant arrhythmias Respiratory: Bilaterally diminished Skin: no rash seen on limited exam Musculoskeletal: No gross abnormality Psychiatric:unable to assess Neurologic:no involuntary movements         Lab Data:   Basic Metabolic Panel: Recent Labs  Lab 06/15/22 0316 06/21/22 0442  NA 135 134*  K 4.5 4.6  CL 96* 98  CO2 30 33*  GLUCOSE 118* 109*  BUN 28* 33*  CREATININE 0.58 0.84  CALCIUM 9.9 9.9    ABG: No results for input(s): "PHART", "PCO2ART", "PO2ART", "HCO3", "O2SAT" in the last 168 hours.  Liver Function Tests: No results for input(s): "AST", "ALT", "ALKPHOS", "BILITOT", "PROT", "ALBUMIN" in the last 168 hours. No results for input(s): "LIPASE", "AMYLASE" in the last 168 hours. No results for input(s): "AMMONIA" in the last 168 hours.  CBC: Recent Labs  Lab 06/15/22 0316 06/21/22 0442  WBC 6.5 6.7  HGB 9.7* 9.3*  HCT 29.8* 28.6*  MCV 87.6 88.8  PLT 181 210    Cardiac Enzymes: No results for input(s): "CKTOTAL", "CKMB", "CKMBINDEX", "TROPONINI" in the last 168  hours.  BNP (last 3 results) Recent Labs    04/08/22 0336  BNP 51.6    ProBNP (last 3 results) No results for input(s): "PROBNP" in the last 8760 hours.  Radiological Exams: No results found.  Assessment/Plan Active Problems:   Sickle cell trait (HCC)   Acute on chronic respiratory failure with hypoxia (HCC)   Pneumonia of right lung due to Pseudomonas species (HCC)   Pleural effusion, right   Tracheostomy dependence (HCC)  Acute on chronic respiratory failure hypoxia-we will continue with T collar goal of 16 hours with 8 hours full support at night.  Continue secretion management supportive care. Sickle cell trait-no change.  We will continue to follow along. Tracheostomy-remains in place and is stable.  Continue with trach care per protocol. Pneumonia due to Pseudomonas -has been treated. Pleural effusion-last chest film free of effusion.  Continue with supportive care and follow-up x-rays as clinically indicated.   I have personally seen and evaluated the patient, evaluated laboratory and imaging results, formulated the assessment and plan and placed orders. The Patient requires high complexity decision making with multiple systems involvement.  Rounds were done with the Respiratory Therapy Director and Staff therapists and discussed with nursing staff also.  Allyne Gee, MD [facility] Pulmonary Critical Care Medicine Sleep Medicine

## 2022-06-23 NOTE — Progress Notes (Cosign Needed)
Pulmonary [place]   PULMONARY CRITICAL CARE SERVICE  PROGRESS NOTE     Sheila Fernandez  [id number]  DOB: July 05, 1951   DOA: 05/26/2022  Referring Physician: Satira Sark, MD  HPI: Sheila Fernandez is a 71 y.o. female being followed for ventilator/airway/oxygen weaning Acute on Chronic Respiratory Failure.  Patient seen sitting up in bed, currently remains on baseline ATC 28%.  Medications: Reviewed on Rounds  Physical Exam:  Vitals: Temp 97.5, pulse 84, respirations 18, BP 155/87, SPO2 100%  Ventilator Settings ATC 28%  General: Comfortable at this time Neck: supple Cardiovascular: no malignant arrhythmias Respiratory: Bilaterally diminished Skin: no rash seen on limited exam Musculoskeletal: No gross abnormality Psychiatric:unable to assess Neurologic:no involuntary movements         Lab Data:   Basic Metabolic Panel: Recent Labs  Lab 06/21/22 0442  NA 134*  K 4.6  CL 98  CO2 33*  GLUCOSE 109*  BUN 33*  CREATININE 0.84  CALCIUM 9.9    ABG: No results for input(s): "PHART", "PCO2ART", "PO2ART", "HCO3", "O2SAT" in the last 168 hours.  Liver Function Tests: No results for input(s): "AST", "ALT", "ALKPHOS", "BILITOT", "PROT", "ALBUMIN" in the last 168 hours. No results for input(s): "LIPASE", "AMYLASE" in the last 168 hours. No results for input(s): "AMMONIA" in the last 168 hours.  CBC: Recent Labs  Lab 06/21/22 0442  WBC 6.7  HGB 9.3*  HCT 28.6*  MCV 88.8  PLT 210    Cardiac Enzymes: No results for input(s): "CKTOTAL", "CKMB", "CKMBINDEX", "TROPONINI" in the last 168 hours.  BNP (last 3 results) Recent Labs    04/08/22 0336  BNP 51.6    ProBNP (last 3 results) No results for input(s): "PROBNP" in the last 8760 hours.  Radiological Exams: No results found.  Assessment/Plan Active Problems:   Sickle cell trait (HCC)   Acute on chronic respiratory failure with hypoxia  (HCC)   Pneumonia of right lung due to Pseudomonas species (HCC)   Pleural effusion, right   Tracheostomy dependence (HCC)   Acute on chronic respiratory failure hypoxia-we will continue with T collar goal of 16 hours with 8 hours full support at night.  Continue secretion management supportive care. Sickle cell trait-no change.  We will continue to follow along. Tracheostomy-remains in place and is stable.  Continue with trach care per protocol. Pneumonia due to Pseudomonas -has been treated. Pleural effusion-last chest film free of effusion.  Continue with supportive care and follow-up x-rays as clinically indicated.   I have personally seen and evaluated the patient, evaluated laboratory and imaging results, formulated the assessment and plan and placed orders. The Patient requires high complexity decision making with multiple systems involvement.  Rounds were done with the Respiratory Therapy Director and Staff therapists and discussed with nursing staff also.  Allyne Gee, MD [facility] Pulmonary Critical Care Medicine Sleep Medicine

## 2022-06-23 NOTE — Progress Notes (Cosign Needed)
Pulmonary [place]   PULMONARY CRITICAL CARE SERVICE  PROGRESS NOTE     Sheila Fernandez  [id number]  DOB: 08/28/1951   DOA: 05/26/2022  Referring Physician: Satira Sark, MD  HPI: Sheila Fernandez is a 71 y.o. female being followed for ventilator/airway/oxygen weaning Acute on Chronic Respiratory Failure.  Patient seen sitting up in bed, currently remains on baseline ATC 28% for the day.  Medications: Reviewed on Rounds  Physical Exam:  Vitals: Temp 97.1, pulse 75, respirations 15, BP 117/25, SPO2 100%  Ventilator Settings ATC 28%  General: Comfortable at this time Neck: supple Cardiovascular: no malignant arrhythmias Respiratory: Bilaterally diminished Skin: no rash seen on limited exam Musculoskeletal: No gross abnormality Psychiatric:unable to assess Neurologic:no involuntary movements         Lab Data:   Basic Metabolic Panel: Recent Labs  Lab 06/21/22 0442  NA 134*  K 4.6  CL 98  CO2 33*  GLUCOSE 109*  BUN 33*  CREATININE 0.84  CALCIUM 9.9    ABG: No results for input(s): "PHART", "PCO2ART", "PO2ART", "HCO3", "O2SAT" in the last 168 hours.  Liver Function Tests: No results for input(s): "AST", "ALT", "ALKPHOS", "BILITOT", "PROT", "ALBUMIN" in the last 168 hours. No results for input(s): "LIPASE", "AMYLASE" in the last 168 hours. No results for input(s): "AMMONIA" in the last 168 hours.  CBC: Recent Labs  Lab 06/21/22 0442  WBC 6.7  HGB 9.3*  HCT 28.6*  MCV 88.8  PLT 210    Cardiac Enzymes: No results for input(s): "CKTOTAL", "CKMB", "CKMBINDEX", "TROPONINI" in the last 168 hours.  BNP (last 3 results) Recent Labs    04/08/22 0336  BNP 51.6    ProBNP (last 3 results) No results for input(s): "PROBNP" in the last 8760 hours.  Radiological Exams: No results found.  Assessment/Plan Active Problems:   Sickle cell trait (HCC)   Acute on chronic respiratory failure with  hypoxia (HCC)   Pneumonia of right lung due to Pseudomonas species (HCC)   Pleural effusion, right   Tracheostomy dependence (HCC)  Acute on chronic respiratory failure hypoxia-we will continue with T collar goal of 16 hours with 8 hours full support at night.  Continue secretion management supportive care. Sickle cell trait-no change.  We will continue to follow along. Tracheostomy-remains in place and is stable.  Continue with trach care per protocol. Pneumonia due to Pseudomonas -has been treated. Pleural effusion-last chest film free of effusion.  Continue with supportive care and follow-up x-rays as clinically indicated.   I have personally seen and evaluated the patient, evaluated laboratory and imaging results, formulated the assessment and plan and placed orders. The Patient requires high complexity decision making with multiple systems involvement.  Rounds were done with the Respiratory Therapy Director and Staff therapists and discussed with nursing staff also.  Allyne Gee, MD [facility] Pulmonary Critical Care Medicine Sleep Medicine

## 2022-06-24 LAB — MISC LABCORP TEST (SEND OUT): Labcorp test code: 452053

## 2022-06-25 ENCOUNTER — Encounter: Payer: Self-pay | Admitting: Gastroenterology

## 2022-06-25 LAB — CBC
HCT: 28.3 % — ABNORMAL LOW (ref 36.0–46.0)
Hemoglobin: 9.1 g/dL — ABNORMAL LOW (ref 12.0–15.0)
MCH: 28.8 pg (ref 26.0–34.0)
MCHC: 32.2 g/dL (ref 30.0–36.0)
MCV: 89.6 fL (ref 80.0–100.0)
Platelets: 218 10*3/uL (ref 150–400)
RBC: 3.16 MIL/uL — ABNORMAL LOW (ref 3.87–5.11)
RDW: 14.8 % (ref 11.5–15.5)
WBC: 9.8 10*3/uL (ref 4.0–10.5)
nRBC: 0 % (ref 0.0–0.2)

## 2022-06-25 LAB — BASIC METABOLIC PANEL
Anion gap: 6 (ref 5–15)
BUN: 44 mg/dL — ABNORMAL HIGH (ref 8–23)
CO2: 35 mmol/L — ABNORMAL HIGH (ref 22–32)
Calcium: 10.1 mg/dL (ref 8.9–10.3)
Chloride: 102 mmol/L (ref 98–111)
Creatinine, Ser: 0.72 mg/dL (ref 0.44–1.00)
GFR, Estimated: >60 mL/min (ref >60)
Glucose, Bld: 142 mg/dL — ABNORMAL HIGH (ref 70–99)
Potassium: 4.8 mmol/L (ref 3.5–5.1)
Sodium: 143 mmol/L (ref 135–145)

## 2022-06-25 LAB — MAGNESIUM: Magnesium: 2.3 mg/dL (ref 1.7–2.4)

## 2022-06-25 NOTE — Progress Notes (Addendum)
Pulmonary [place]   PULMONARY CRITICAL CARE SERVICE  PROGRESS NOTE     SLOAN GALENTINE  GYK:[id number]  DOB: August 16, 1951   DOA: 05/26/2022  Referring Physician: Satira Sark, MD  HPI: Sheila Fernandez is a 71 y.o. female being followed for ventilator/airway/oxygen weaning Acute on Chronic Respiratory Failure.  Patient seen sitting up in chair, family at bedside, currently on ATC 28%.  Medications: Reviewed on Rounds  Physical Exam:  Vitals: Temp 97.4, pulse 98, respirations 25, BP 96/64, SPO2 98%  Ventilator Settings ATC 28%  General: Comfortable at this time Neck: supple Cardiovascular: no malignant arrhythmias Respiratory: Bilaterally clear but diminished Skin: no rash seen on limited exam Musculoskeletal: No gross abnormality Psychiatric:unable to assess Neurologic:no involuntary movements         Lab Data:   Basic Metabolic Panel: Recent Labs  Lab 06/21/22 0442 06/25/22 0250  NA 134* 143  K 4.6 4.8  CL 98 102  CO2 33* 35*  GLUCOSE 109* 142*  BUN 33* 44*  CREATININE 0.84 0.72  CALCIUM 9.9 10.1  MG  --  2.3    ABG: No results for input(s): "PHART", "PCO2ART", "PO2ART", "HCO3", "O2SAT" in the last 168 hours.  Liver Function Tests: No results for input(s): "AST", "ALT", "ALKPHOS", "BILITOT", "PROT", "ALBUMIN" in the last 168 hours. No results for input(s): "LIPASE", "AMYLASE" in the last 168 hours. No results for input(s): "AMMONIA" in the last 168 hours.  CBC: Recent Labs  Lab 06/21/22 0442 06/25/22 0250  WBC 6.7 9.8  HGB 9.3* 9.1*  HCT 28.6* 28.3*  MCV 88.8 89.6  PLT 210 218    Cardiac Enzymes: No results for input(s): "CKTOTAL", "CKMB", "CKMBINDEX", "TROPONINI" in the last 168 hours.  BNP (last 3 results) Recent Labs    04/08/22 0336  BNP 51.6    ProBNP (last 3 results) No results for input(s): "PROBNP" in the last 8760 hours.  Radiological Exams: No results  found.  Assessment/Plan Active Problems:   Sickle cell trait (HCC)   Acute on chronic respiratory failure with hypoxia (HCC)   Pneumonia of right lung due to Pseudomonas species (HCC)   Pleural effusion, right   Tracheostomy dependence (HCC)   Acute on chronic respiratory failure hypoxia-we will continue with T collar goal of 16 hours with 8 hours full support at night.  Continue secretion management supportive care. Sickle cell trait-no change.  We will continue to follow along. Tracheostomy-remains in place and is stable.  Continue with trach care per protocol. Pneumonia due to Pseudomonas -has been treated. Pleural effusion-last chest film free of effusion.  Continue with supportive care and follow-up x-rays as clinically indicated.   I have personally seen and evaluated the patient, evaluated laboratory and imaging results, formulated the assessment and plan and placed orders. The Patient requires high complexity decision making with multiple systems involvement.  Rounds were done with the Respiratory Therapy Director and Staff therapists and discussed with nursing staff also.  Allyne Gee, MD [facility] Pulmonary Critical Care Medicine Sleep Medicine

## 2022-06-25 NOTE — Progress Notes (Addendum)
Pulmonary [place]   PULMONARY CRITICAL CARE SERVICE  PROGRESS NOTE     Sheila Fernandez  [id number]:[id number]  DOB: 01-12-1951   DOA: 05/26/2022  Referring Physician: Satira Sark, MD  HPI: Sheila Fernandez is a 71 y.o. female being followed for ventilator/airway/oxygen weaning Acute on Chronic Respiratory Failure.  Patient seen lying in bed, currently on full support.  Apparently overnight she became unresponsive momentarily and her sats dropped to 0, she required bagging and subsequently being placed back on full support.  We will rest her today and then resume with ATC 16 hours with vent support at night.  Medications: Reviewed on Rounds  Physical Exam:  Vitals: Temp 97.0, pulse 75, respirations 13, BP 94/52, SPO2 99%  Ventilator Settings AC VC plus, FiO2 20%, tidal volume 400, rate 12, PEEP 5  General: Comfortable at this time Neck: supple Cardiovascular: no malignant arrhythmias Respiratory: Bilaterally diminished Skin: no rash seen on limited exam Musculoskeletal: No gross abnormality Psychiatric:unable to assess Neurologic:no involuntary movements         Lab Data:   Basic Metabolic Panel: Recent Labs  Lab 06/21/22 0442 06/25/22 0250  NA 134* 143  K 4.6 4.8  CL 98 102  CO2 33* 35*  GLUCOSE 109* 142*  BUN 33* 44*  CREATININE 0.84 0.72  CALCIUM 9.9 10.1  MG  --  2.3    ABG: No results for input(s): "PHART", "PCO2ART", "PO2ART", "HCO3", "O2SAT" in the last 168 hours.  Liver Function Tests: No results for input(s): "AST", "ALT", "ALKPHOS", "BILITOT", "PROT", "ALBUMIN" in the last 168 hours. No results for input(s): "LIPASE", "AMYLASE" in the last 168 hours. No results for input(s): "AMMONIA" in the last 168 hours.  CBC: Recent Labs  Lab 06/21/22 0442 06/25/22 0250  WBC 6.7 9.8  HGB 9.3* 9.1*  HCT 28.6* 28.3*  MCV 88.8 89.6  PLT 210 218    Cardiac Enzymes: No results for input(s): "CKTOTAL",  "CKMB", "CKMBINDEX", "TROPONINI" in the last 168 hours.  BNP (last 3 results) Recent Labs    04/08/22 0336  BNP 51.6    ProBNP (last 3 results) No results for input(s): "PROBNP" in the last 8760 hours.  Radiological Exams: No results found.  Assessment/Plan Active Problems:   Sickle cell trait (HCC)   Acute on chronic respiratory failure with hypoxia (HCC)   Pneumonia of right lung due to Pseudomonas species (HCC)   Pleural effusion, right   Tracheostomy dependence (HCC)   Acute on chronic respiratory failure hypoxia-currently on full support after rapid response.  Tomorrow we will continue with T collar goal of 16 hours with 8 hours full support at night.  Continue secretion management supportive care. Sickle cell trait-no change.  We will continue to follow along. Tracheostomy-remains in place and is stable.  Continue with trach care per protocol. Pneumonia due to Pseudomonas -has been treated. Pleural effusion-last chest film free of effusion.  Continue with supportive care and follow-up x-rays as clinically indicated.  I have personally seen and evaluated the patient, evaluated laboratory and imaging results, formulated the assessment and plan and placed orders. The Patient requires high complexity decision making with multiple systems involvement.  Rounds were done with the Respiratory Therapy Director and Staff therapists and discussed with nursing staff also.  Allyne Gee, MD [facility] Pulmonary Critical Care Medicine Sleep Medicine

## 2022-06-26 NOTE — Progress Notes (Signed)
Pulmonary [place]   PULMONARY CRITICAL CARE SERVICE  PROGRESS NOTE     Sheila Fernandez  [id number]:[id number]  DOB: 1951/09/04   DOA: 05/26/2022  Referring Physician: Satira Sark, MD  HPI: Sheila Fernandez is a 71 y.o. female being followed for ventilator/airway/oxygen weaning Acute on Chronic Respiratory Failure.  Apparently patient had another near code episode yesterday patient is back on the ventilator  Medications: Reviewed on Rounds  Physical Exam:  Vitals: Temperature is 97.0 pulse 84 respiratory 20 blood pressure is 90/60 saturations 99%  Ventilator Settings on assist control FiO2 28% tidal volume 400 PEEP 5  General: Comfortable at this time Neck: supple Cardiovascular: no malignant arrhythmias Respiratory: No rhonchi very coarse breath sounds Skin: no rash seen on limited exam Musculoskeletal: No gross abnormality Psychiatric:unable to assess Neurologic:no involuntary movements         Lab Data:   Basic Metabolic Panel: Recent Labs  Lab 06/21/22 0442 06/25/22 0250  NA 134* 143  K 4.6 4.8  CL 98 102  CO2 33* 35*  GLUCOSE 109* 142*  BUN 33* 44*  CREATININE 0.84 0.72  CALCIUM 9.9 10.1  MG  --  2.3    ABG: No results for input(s): "PHART", "PCO2ART", "PO2ART", "HCO3", "O2SAT" in the last 168 hours.  Liver Function Tests: No results for input(s): "AST", "ALT", "ALKPHOS", "BILITOT", "PROT", "ALBUMIN" in the last 168 hours. No results for input(s): "LIPASE", "AMYLASE" in the last 168 hours. No results for input(s): "AMMONIA" in the last 168 hours.  CBC: Recent Labs  Lab 06/21/22 0442 06/25/22 0250  WBC 6.7 9.8  HGB 9.3* 9.1*  HCT 28.6* 28.3*  MCV 88.8 89.6  PLT 210 218    Cardiac Enzymes: No results for input(s): "CKTOTAL", "CKMB", "CKMBINDEX", "TROPONINI" in the last 168 hours.  BNP (last 3 results) Recent Labs    04/08/22 0336  BNP 51.6    ProBNP (last 3 results) No results for  input(s): "PROBNP" in the last 8760 hours.  Radiological Exams: No results found.  Assessment/Plan Active Problems:   Sickle cell trait (HCC)   Acute on chronic respiratory failure with hypoxia (HCC)   Pneumonia of right lung due to Pseudomonas species (HCC)   Pleural effusion, right   Tracheostomy dependence (HCC)   Acute on chronic respiratory failure hypoxia plan will be to continue with the full vent support held the weaning for now Pneumonia supportive care we will continue to monitor Pleural effusion overall no change supportive care Tracheostomy remains in place Sickle cell trait has been stable   I have personally seen and evaluated the patient, evaluated laboratory and imaging results, formulated the assessment and plan and placed orders. The Patient requires high complexity decision making with multiple systems involvement.  Rounds were done with the Respiratory Therapy Director and Staff therapists and discussed with nursing staff also.  Allyne Gee, MD [facility] Pulmonary Critical Care Medicine Sleep Medicine

## 2022-06-27 NOTE — Progress Notes (Signed)
Pulmonary [place]   PULMONARY CRITICAL CARE SERVICE  PROGRESS NOTE     Sheila Fernandez  [id number]:[id number]  DOB: Jan 23, 1951   DOA: 05/26/2022  Referring Physician: Satira Sark, MD  HPI: Sheila Fernandez is a 71 y.o. female being followed for ventilator/airway/oxygen weaning Acute on Chronic Respiratory Failure.  Patient currently is off the ventilator on T collar has been going back off the T collar on the ventilator at nighttime  Medications: Reviewed on Rounds  Physical Exam:  Vitals: Temperature 96.8 pulse of 78 respiratory 19 blood pressure is 103/67 saturations 100%  Ventilator Settings on T collar  General: Comfortable at this time Neck: supple Cardiovascular: no malignant arrhythmias Respiratory: No rhonchi no rales noted Skin: no rash seen on limited exam Musculoskeletal: No gross abnormality Psychiatric:unable to assess Neurologic:no involuntary movements         Lab Data:   Basic Metabolic Panel: Recent Labs  Lab 06/21/22 0442 06/25/22 0250  NA 134* 143  K 4.6 4.8  CL 98 102  CO2 33* 35*  GLUCOSE 109* 142*  BUN 33* 44*  CREATININE 0.84 0.72  CALCIUM 9.9 10.1  MG  --  2.3    ABG: No results for input(s): "PHART", "PCO2ART", "PO2ART", "HCO3", "O2SAT" in the last 168 hours.  Liver Function Tests: No results for input(s): "AST", "ALT", "ALKPHOS", "BILITOT", "PROT", "ALBUMIN" in the last 168 hours. No results for input(s): "LIPASE", "AMYLASE" in the last 168 hours. No results for input(s): "AMMONIA" in the last 168 hours.  CBC: Recent Labs  Lab 06/21/22 0442 06/25/22 0250  WBC 6.7 9.8  HGB 9.3* 9.1*  HCT 28.6* 28.3*  MCV 88.8 89.6  PLT 210 218    Cardiac Enzymes: No results for input(s): "CKTOTAL", "CKMB", "CKMBINDEX", "TROPONINI" in the last 168 hours.  BNP (last 3 results) Recent Labs    04/08/22 0336  BNP 51.6    ProBNP (last 3 results) No results for input(s): "PROBNP" in  the last 8760 hours.  Radiological Exams: No results found.  Assessment/Plan Active Problems:   Sickle cell trait (HCC)   Acute on chronic respiratory failure with hypoxia (HCC)   Pneumonia of right lung due to Pseudomonas species (HCC)   Pleural effusion, right   Tracheostomy dependence (HCC)   Acute on chronic respiratory failure hypoxia plan is to continue with the T-piece during daytime rest on the ventilator at nighttime patient has had multiple attempts and failures at completely being liberated from the ventilator Seasonal trait no change we will continue to follow along closely. Pneumonia due to Pseudomonas has been treated clinically is improved Tracheostomy remains in place Pleural effusion follow clinically   I have personally seen and evaluated the patient, evaluated laboratory and imaging results, formulated the assessment and plan and placed orders. The Patient requires high complexity decision making with multiple systems involvement.  Rounds were done with the Respiratory Therapy Director and Staff therapists and discussed with nursing staff also.  Allyne Gee, MD [facility] Pulmonary Critical Care Medicine Sleep Medicine

## 2022-06-28 NOTE — Progress Notes (Signed)
Pulmonary [place]   PULMONARY CRITICAL CARE SERVICE  PROGRESS NOTE     Sheila Fernandez  [id number]:[id number]  DOB: 1950-12-07   DOA: 05/26/2022  Referring Physician: Satira Sark, MD  HPI: Sheila Fernandez is a 71 y.o. female being followed for ventilator/airway/oxygen weaning Acute on Chronic Respiratory Failure.  Patient currently is on the ventilator full support on assist control mode has been on 28% FiO2  Medications: Reviewed on Rounds  Physical Exam:  Vitals: Temperature is 97.0 pulse of 90 respiratory rate 28 blood pressure 98/57 saturations 98%  Ventilator Settings on assist control FiO2 is 28% tidal volume 400 PEEP 5  General: Comfortable at this time Neck: supple Cardiovascular: no malignant arrhythmias Respiratory: Scattered rhonchi expansion is equal Skin: no rash seen on limited exam Musculoskeletal: No gross abnormality Psychiatric:unable to assess Neurologic:no involuntary movements         Lab Data:   Basic Metabolic Panel: Recent Labs  Lab 06/25/22 0250  NA 143  K 4.8  CL 102  CO2 35*  GLUCOSE 142*  BUN 44*  CREATININE 0.72  CALCIUM 10.1  MG 2.3    ABG: No results for input(s): "PHART", "PCO2ART", "PO2ART", "HCO3", "O2SAT" in the last 168 hours.  Liver Function Tests: No results for input(s): "AST", "ALT", "ALKPHOS", "BILITOT", "PROT", "ALBUMIN" in the last 168 hours. No results for input(s): "LIPASE", "AMYLASE" in the last 168 hours. No results for input(s): "AMMONIA" in the last 168 hours.  CBC: Recent Labs  Lab 06/25/22 0250  WBC 9.8  HGB 9.1*  HCT 28.3*  MCV 89.6  PLT 218    Cardiac Enzymes: No results for input(s): "CKTOTAL", "CKMB", "CKMBINDEX", "TROPONINI" in the last 168 hours.  BNP (last 3 results) Recent Labs    04/08/22 0336  BNP 51.6    ProBNP (last 3 results) No results for input(s): "PROBNP" in the last 8760 hours.  Radiological Exams: No results  found.  Assessment/Plan Active Problems:   Sickle cell trait (HCC)   Acute on chronic respiratory failure with hypoxia (HCC)   Pneumonia of right lung due to Pseudomonas species (HCC)   Pleural effusion, right   Tracheostomy dependence (HCC)   Acute on chronic respiratory failure hypoxia patient currently is on full support on the ventilator holding off on weaning.  Supposed to go back on T collar during the day once she is stable Sickle cell trait no change continue with supportive care Pneumonia due to Pseudomonas has been treated clinically improving Trach dependent we will continue with supportive Pleural effusion no change   I have personally seen and evaluated the patient, evaluated laboratory and imaging results, formulated the assessment and plan and placed orders. The Patient requires high complexity decision making with multiple systems involvement.  Rounds were done with the Respiratory Therapy Director and Staff therapists and discussed with nursing staff also.  Allyne Gee, MD [facility] Pulmonary Critical Care Medicine Sleep Medicine

## 2022-06-29 DIAGNOSIS — J9621 Acute and chronic respiratory failure with hypoxia: Secondary | ICD-10-CM | POA: Diagnosis not present

## 2022-06-29 DIAGNOSIS — D573 Sickle-cell trait: Secondary | ICD-10-CM | POA: Diagnosis not present

## 2022-06-29 DIAGNOSIS — J90 Pleural effusion, not elsewhere classified: Secondary | ICD-10-CM | POA: Diagnosis not present

## 2022-06-29 DIAGNOSIS — J151 Pneumonia due to Pseudomonas: Secondary | ICD-10-CM | POA: Diagnosis not present

## 2022-06-29 NOTE — Progress Notes (Signed)
Pulmonary [place]   PULMONARY CRITICAL CARE SERVICE  PROGRESS NOTE     Sheila Fernandez  [id number]:[id number]  DOB: Nov 13, 1950   DOA: 05/26/2022  Referring Physician: Satira Sark, MD  HPI: Sheila Fernandez is a 71 y.o. female being followed for ventilator/airway/oxygen weaning Acute on Chronic Respiratory Failure.  Patient is resting comfortably without distress at this time has been on full support on the ventilator.  Medications: Reviewed on Rounds  Physical Exam:  Vitals: Temperature is 97.6 pulse 96 respiratory 22 blood pressure 119/72 saturations 98%  Ventilator Settings on assist control FiO2 is 28% tidal volume 408 PEEP 5  General: Comfortable at this time Neck: supple Cardiovascular: no malignant arrhythmias Respiratory: No rhonchi no rales are noted Skin: no rash seen on limited exam Musculoskeletal: No gross abnormality Psychiatric:unable to assess Neurologic:no involuntary movements         Lab Data:   Basic Metabolic Panel: Recent Labs  Lab 06/25/22 0250  NA 143  K 4.8  CL 102  CO2 35*  GLUCOSE 142*  BUN 44*  CREATININE 0.72  CALCIUM 10.1  MG 2.3    ABG: No results for input(s): "PHART", "PCO2ART", "PO2ART", "HCO3", "O2SAT" in the last 168 hours.  Liver Function Tests: No results for input(s): "AST", "ALT", "ALKPHOS", "BILITOT", "PROT", "ALBUMIN" in the last 168 hours. No results for input(s): "LIPASE", "AMYLASE" in the last 168 hours. No results for input(s): "AMMONIA" in the last 168 hours.  CBC: Recent Labs  Lab 06/25/22 0250  WBC 9.8  HGB 9.1*  HCT 28.3*  MCV 89.6  PLT 218    Cardiac Enzymes: No results for input(s): "CKTOTAL", "CKMB", "CKMBINDEX", "TROPONINI" in the last 168 hours.  BNP (last 3 results) Recent Labs    04/08/22 0336  BNP 51.6    ProBNP (last 3 results) No results for input(s): "PROBNP" in the last 8760 hours.  Radiological Exams: No results  found.  Assessment/Plan Active Problems:   Sickle cell trait (HCC)   Acute on chronic respiratory failure with hypoxia (HCC)   Pneumonia of right lung due to Pseudomonas species (HCC)   Pleural effusion, right   Tracheostomy dependence (HCC)   Acute on chronic respiratory failure hypoxia we will continue with full support on the ventilator she has failed numerous attempts at weaning Sickle cell trait no change we will continue to follow along Pneumonia has been treated with antibiotics clinically appears to be improving Trach dependence supportive care Pleural effusion status post drainage   I have personally seen and evaluated the patient, evaluated laboratory and imaging results, formulated the assessment and plan and placed orders. The Patient requires high complexity decision making with multiple systems involvement.  Rounds were done with the Respiratory Therapy Director and Staff therapists and discussed with nursing staff also.  Allyne Gee, MD [facility] Pulmonary Critical Care Medicine Sleep Medicine

## 2022-06-30 LAB — BASIC METABOLIC PANEL
Anion gap: 4 — ABNORMAL LOW (ref 5–15)
BUN: 61 mg/dL — ABNORMAL HIGH (ref 8–23)
CO2: 33 mmol/L — ABNORMAL HIGH (ref 22–32)
Calcium: 9.8 mg/dL (ref 8.9–10.3)
Chloride: 113 mmol/L — ABNORMAL HIGH (ref 98–111)
Creatinine, Ser: 0.87 mg/dL (ref 0.44–1.00)
GFR, Estimated: >60 mL/min (ref >60)
Glucose, Bld: 172 mg/dL — ABNORMAL HIGH (ref 70–99)
Potassium: 4.1 mmol/L (ref 3.5–5.1)
Sodium: 150 mmol/L — ABNORMAL HIGH (ref 135–145)

## 2022-06-30 LAB — CBC
HCT: 26.6 % — ABNORMAL LOW (ref 36.0–46.0)
Hemoglobin: 8.1 g/dL — ABNORMAL LOW (ref 12.0–15.0)
MCH: 27.8 pg (ref 26.0–34.0)
MCHC: 30.5 g/dL (ref 30.0–36.0)
MCV: 91.4 fL (ref 80.0–100.0)
Platelets: 194 10*3/uL (ref 150–400)
RBC: 2.91 MIL/uL — ABNORMAL LOW (ref 3.87–5.11)
RDW: 14.9 % (ref 11.5–15.5)
WBC: 5.4 10*3/uL (ref 4.0–10.5)
nRBC: 0 % (ref 0.0–0.2)

## 2022-06-30 NOTE — Progress Notes (Signed)
Pulmonary [place]   PULMONARY CRITICAL CARE SERVICE  PROGRESS NOTE     Sheila Fernandez  IAX:[id number]  DOB: [birth year]   DOA: 05/26/2022  Referring Physician: Satira Sark, MD  HPI: Sheila Fernandez is a 71 y.o. female being followed for ventilator/airway/oxygen weaning Acute on Chronic Respiratory Failure.  Patient is on the ventilator full support we have discontinued the weaning due to several medical issues  Medications: Reviewed on Rounds  Physical Exam:  Vitals: Temperature is 97.0 pulse 62 respiratory is 14 blood pressure is 115/63 saturations 100%  Ventilator Settings on assist control FiO2 is 28% tidal volume 413 PEEP of 5  General: Comfortable at this time Neck: supple Cardiovascular: no malignant arrhythmias Respiratory: Scattered rhonchi very coarse breath sounds Skin: no rash seen on limited exam Musculoskeletal: No gross abnormality Psychiatric:unable to assess Neurologic:no involuntary movements         Lab Data:   Basic Metabolic Panel: Recent Labs  Lab 06/25/22 0250 06/30/22 0214  NA 143 150*  K 4.8 4.1  CL 102 113*  CO2 35* 33*  GLUCOSE 142* 172*  BUN 44* 61*  CREATININE 0.72 0.87  CALCIUM 10.1 9.8  MG 2.3  --     ABG: No results for input(s): "PHART", "PCO2ART", "PO2ART", "HCO3", "O2SAT" in the last 168 hours.  Liver Function Tests: No results for input(s): "AST", "ALT", "ALKPHOS", "BILITOT", "PROT", "ALBUMIN" in the last 168 hours. No results for input(s): "LIPASE", "AMYLASE" in the last 168 hours. No results for input(s): "AMMONIA" in the last 168 hours.  CBC: Recent Labs  Lab 06/25/22 0250 06/30/22 0214  WBC 9.8 5.4  HGB 9.1* 8.1*  HCT 28.3* 26.6*  MCV 89.6 91.4  PLT 218 194    Cardiac Enzymes: No results for input(s): "CKTOTAL", "CKMB", "CKMBINDEX", "TROPONINI" in the last 168 hours.  BNP (last 3 results) Recent Labs    04/08/22 0336  BNP 51.6    ProBNP  (last 3 results) No results for input(s): "PROBNP" in the last 8760 hours.  Radiological Exams: No results found.  Assessment/Plan Active Problems:   Sickle cell trait (HCC)   Acute on chronic respiratory failure with hypoxia (HCC)   Pneumonia of right lung due to Pseudomonas species (HCC)   Pleural effusion, right   Tracheostomy dependence (HCC)   Acute on chronic respiratory failure hypoxia on assist control mode full support no weaning at this time. Sickle cell trait no change supportive care Pneumonia of the lung supportive care we will continue with pulmonary toilet Tracheostomy remains in place Lower effusion supportive care   I have personally seen and evaluated the patient, evaluated laboratory and imaging results, formulated the assessment and plan and placed orders. The Patient requires high complexity decision making with multiple systems involvement.  Rounds were done with the Respiratory Therapy Director and Staff therapists and discussed with nursing staff also.  Allyne Gee, MD [facility] Pulmonary Critical Care Medicine Sleep Medicine

## 2022-07-01 LAB — SODIUM: Sodium: 151 mmol/L — ABNORMAL HIGH (ref 135–145)

## 2022-07-01 NOTE — Progress Notes (Signed)
Pulmonary [place]   PULMONARY CRITICAL CARE SERVICE  PROGRESS NOTE     Sheila Fernandez  QVZ:[id number]  DOB: [birth year]   DOA: 05/26/2022  Referring Physician: Satira Sark, MD  HPI: Sheila Fernandez is a 71 y.o. female being followed for ventilator/airway/oxygen weaning Acute on Chronic Respiratory Failure.  Patient currently is on the ventilator full support she is at her baseline  Medications: Reviewed on Rounds  Physical Exam:  Vitals: Temperature is 97.1 pulse 62 respiratory 17 blood pressure 116/62 saturations 98%  Ventilator Settings assist-control FiO2 is 28% tidal volume 400 PEEP of 5  General: Comfortable at this time Neck: supple Cardiovascular: no malignant arrhythmias Respiratory: Scattered rhonchi expansion is equal Skin: no rash seen on limited exam Musculoskeletal: No gross abnormality Psychiatric:unable to assess Neurologic:no involuntary movements         Lab Data:   Basic Metabolic Panel: Recent Labs  Lab 06/25/22 0250 06/30/22 0214 07/01/22 0133  NA 143 150* 151*  K 4.8 4.1  --   CL 102 113*  --   CO2 35* 33*  --   GLUCOSE 142* 172*  --   BUN 44* 61*  --   CREATININE 0.72 0.87  --   CALCIUM 10.1 9.8  --   MG 2.3  --   --     ABG: No results for input(s): "PHART", "PCO2ART", "PO2ART", "HCO3", "O2SAT" in the last 168 hours.  Liver Function Tests: No results for input(s): "AST", "ALT", "ALKPHOS", "BILITOT", "PROT", "ALBUMIN" in the last 168 hours. No results for input(s): "LIPASE", "AMYLASE" in the last 168 hours. No results for input(s): "AMMONIA" in the last 168 hours.  CBC: Recent Labs  Lab 06/25/22 0250 06/30/22 0214  WBC 9.8 5.4  HGB 9.1* 8.1*  HCT 28.3* 26.6*  MCV 89.6 91.4  PLT 218 194    Cardiac Enzymes: No results for input(s): "CKTOTAL", "CKMB", "CKMBINDEX", "TROPONINI" in the last 168 hours.  BNP (last 3 results) Recent Labs    04/08/22 0336  BNP 51.6     ProBNP (last 3 results) No results for input(s): "PROBNP" in the last 8760 hours.  Radiological Exams: No results found.  Assessment/Plan Active Problems:   Sickle cell trait (HCC)   Acute on chronic respiratory failure with hypoxia (HCC)   Pneumonia of right lung due to Pseudomonas species (HCC)   Pleural effusion, right   Tracheostomy dependence (HCC)   Acute on chronic respiratory failure hypoxia we will continue with the full support on the ventilator.  Continue pulmonary toilet and supportive care Sickle cell trait no change we will continue to follow along Effusion no change Tracheostomy remains in place Pneumonia has been treated with antibiotics   I have personally seen and evaluated the patient, evaluated laboratory and imaging results, formulated the assessment and plan and placed orders. The Patient requires high complexity decision making with multiple systems involvement.  Rounds were done with the Respiratory Therapy Director and Staff therapists and discussed with nursing staff also.  Allyne Gee, MD [facility] Pulmonary Critical Care Medicine Sleep Medicine

## 2022-07-02 DIAGNOSIS — J151 Pneumonia due to Pseudomonas: Secondary | ICD-10-CM

## 2022-07-02 DIAGNOSIS — D573 Sickle-cell trait: Secondary | ICD-10-CM

## 2022-07-02 DIAGNOSIS — J90 Pleural effusion, not elsewhere classified: Secondary | ICD-10-CM

## 2022-07-02 DIAGNOSIS — J9621 Acute and chronic respiratory failure with hypoxia: Secondary | ICD-10-CM

## 2022-07-02 DIAGNOSIS — Z930 Tracheostomy status: Secondary | ICD-10-CM

## 2022-07-02 NOTE — Progress Notes (Signed)
Pulmonary [place]   PULMONARY CRITICAL CARE SERVICE  PROGRESS NOTE     Sheila Fernandez  ZOX:[id number]  DOB: 01-25-1951   DOA: 05/26/2022  Referring Physician: Satira Sark, MD  HPI: Sheila Fernandez is a 71 y.o. female being followed for ventilator/airway/oxygen weaning Acute on Chronic Respiratory Failure.  Patient at this time is on the ventilator full support she is resting appears to be comfortable has failed numerous attempts at weaning  Medications: Reviewed on Rounds  Physical Exam:  Vitals: Temperature is 96.8 pulse 63 respiratory 14 blood pressure is 105/64 saturations 100%  Ventilator Settings assist-control FiO2 is 28% tidal volume 400 PEEP 5  General: Comfortable at this time Neck: supple Cardiovascular: no malignant arrhythmias Respiratory: No rhonchi no rales are noted Skin: no rash seen on limited exam Musculoskeletal: No gross abnormality Psychiatric:unable to assess Neurologic:no involuntary movements         Lab Data:   Basic Metabolic Panel: Recent Labs  Lab 06/30/22 0214 07/01/22 0133  NA 150* 151*  K 4.1  --   CL 113*  --   CO2 33*  --   GLUCOSE 172*  --   BUN 61*  --   CREATININE 0.87  --   CALCIUM 9.8  --     ABG: No results for input(s): "PHART", "PCO2ART", "PO2ART", "HCO3", "O2SAT" in the last 168 hours.  Liver Function Tests: No results for input(s): "AST", "ALT", "ALKPHOS", "BILITOT", "PROT", "ALBUMIN" in the last 168 hours. No results for input(s): "LIPASE", "AMYLASE" in the last 168 hours. No results for input(s): "AMMONIA" in the last 168 hours.  CBC: Recent Labs  Lab 06/30/22 0214  WBC 5.4  HGB 8.1*  HCT 26.6*  MCV 91.4  PLT 194    Cardiac Enzymes: No results for input(s): "CKTOTAL", "CKMB", "CKMBINDEX", "TROPONINI" in the last 168 hours.  BNP (last 3 results) Recent Labs    04/08/22 0336  BNP 51.6    ProBNP (last 3 results) No results for input(s):  "PROBNP" in the last 8760 hours.  Radiological Exams: No results found.  Assessment/Plan Active Problems:   Sickle cell trait (HCC)   Acute on chronic respiratory failure with hypoxia (HCC)   Pneumonia of right lung due to Pseudomonas species (HCC)   Pleural effusion, right   Tracheostomy dependence (HCC)   Acute on chronic respiratory failure hypoxia we will continue with full support on the ventilator.  Patient resting comfortably without distress she is now being weaned at this time due to failure on numerous attempts Sickle trait no change we will continue to follow along closely. Pneumonia due to Pseudomonas supportive care Trach dependent no change continue to monitor Pleural effusion supportive care   I have personally seen and evaluated the patient, evaluated laboratory and imaging results, formulated the assessment and plan and placed orders. The Patient requires high complexity decision making with multiple systems involvement.  Rounds were done with the Respiratory Therapy Director and Staff therapists and discussed with nursing staff also.  Allyne Gee, MD [facility] Pulmonary Critical Care Medicine Sleep Medicine

## 2022-07-03 LAB — BASIC METABOLIC PANEL
Anion gap: 4 — ABNORMAL LOW (ref 5–15)
BUN: 44 mg/dL — ABNORMAL HIGH (ref 8–23)
CO2: 31 mmol/L (ref 22–32)
Calcium: 9.2 mg/dL (ref 8.9–10.3)
Chloride: 106 mmol/L (ref 98–111)
Creatinine, Ser: 0.75 mg/dL (ref 0.44–1.00)
GFR, Estimated: >60 mL/min (ref >60)
Glucose, Bld: 138 mg/dL — ABNORMAL HIGH (ref 70–99)
Potassium: 3.8 mmol/L (ref 3.5–5.1)
Sodium: 141 mmol/L (ref 135–145)

## 2022-07-03 NOTE — Progress Notes (Signed)
Pulmonary [place]   PULMONARY CRITICAL CARE SERVICE  PROGRESS NOTE     Sheila Fernandez  [id number]  DOB: Aug 24, 1951   DOA: 05/26/2022  Referring Physician: Satira Sark, MD  HPI: Sheila Fernandez is a 71 y.o. female being followed for ventilator/airway/oxygen weaning Acute on Chronic Respiratory Failure.  Patient is currently on assist-control mode has been on 28% FiO2 with PEEP of 5  Medications: Reviewed on Rounds  Physical Exam:  Vitals: Temperature 97.1 pulse of 93 respiratory 13 blood pressure is 92/57 saturations 100%  Ventilator Settings patient is currently on assist-control FiO2 28% PEEP 5 tidal volume 400  General: Comfortable at this time Neck: supple Cardiovascular: no malignant arrhythmias Respiratory: No rhonchi very coarse breath sounds Skin: no rash seen on limited exam Musculoskeletal: No gross abnormality Psychiatric:unable to assess Neurologic:no involuntary movements         Lab Data:   Basic Metabolic Panel: Recent Labs  Lab 06/30/22 0214 07/01/22 0133 07/03/22 0148  NA 150* 151* 141  K 4.1  --  3.8  CL 113*  --  106  CO2 33*  --  31  GLUCOSE 172*  --  138*  BUN 61*  --  44*  CREATININE 0.87  --  0.75  CALCIUM 9.8  --  9.2    ABG: No results for input(s): "PHART", "PCO2ART", "PO2ART", "HCO3", "O2SAT" in the last 168 hours.  Liver Function Tests: No results for input(s): "AST", "ALT", "ALKPHOS", "BILITOT", "PROT", "ALBUMIN" in the last 168 hours. No results for input(s): "LIPASE", "AMYLASE" in the last 168 hours. No results for input(s): "AMMONIA" in the last 168 hours.  CBC: Recent Labs  Lab 06/30/22 0214  WBC 5.4  HGB 8.1*  HCT 26.6*  MCV 91.4  PLT 194    Cardiac Enzymes: No results for input(s): "CKTOTAL", "CKMB", "CKMBINDEX", "TROPONINI" in the last 168 hours.  BNP (last 3 results) Recent Labs    04/08/22 0336  BNP 51.6    ProBNP (last 3 results) No  results for input(s): "PROBNP" in the last 8760 hours.  Radiological Exams: No results found.  Assessment/Plan Active Problems:   Sickle cell trait (HCC)   Acute on chronic respiratory failure with hypoxia (HCC)   Pneumonia of right lung due to Pseudomonas species (HCC)   Pleural effusion, right   Tracheostomy dependence (HCC)   Acute on chronic respiratory failure hypoxia we will continue with assist control mode patient is going to have tracheostomy changed out routine trach change 6 cell trait no change we will continue with supportive care Pneumonia has been treated we will continue to follow along closely Trach dependence again we will change out the trach to a #6 today Pleural effusion supportive care   I have personally seen and evaluated the patient, evaluated laboratory and imaging results, formulated the assessment and plan and placed orders. The Patient requires high complexity decision making with multiple systems involvement.  Rounds were done with the Respiratory Therapy Director and Staff therapists and discussed with nursing staff also.  Allyne Gee, MD [facility] Pulmonary Critical Care Medicine Sleep Medicine

## 2022-07-04 ENCOUNTER — Other Ambulatory Visit (HOSPITAL_COMMUNITY): Payer: Self-pay

## 2022-07-04 NOTE — Progress Notes (Signed)
Pulmonary [place]   PULMONARY CRITICAL CARE SERVICE  PROGRESS NOTE     Sheila Fernandez  [id number]:[id number]  DOB: 1951/02/02   DOA: 05/26/2022  Referring Physician: Satira Sark, MD  HPI: Sheila Fernandez is a 71 y.o. female being followed for ventilator/airway/oxygen weaning Acute on Chronic Respiratory Failure.  Patient is on assist control mode has been on full support currently 28% FiO2  Medications: Reviewed on Rounds  Physical Exam:  Vitals: Temperature is 98.5 pulse 75 respiratory 12 blood pressure is 113/71 saturations 100%  Ventilator Settings assist-control FiO2 28% tidal volume 400 PEEP 5  General: Comfortable at this time Neck: supple Cardiovascular: no malignant arrhythmias Respiratory: Scattered rhonchi expansion equal Skin: no rash seen on limited exam Musculoskeletal: No gross abnormality Psychiatric:unable to assess Neurologic:no involuntary movements         Lab Data:   Basic Metabolic Panel: Recent Labs  Lab 06/30/22 0214 07/01/22 0133 07/03/22 0148  NA 150* 151* 141  K 4.1  --  3.8  CL 113*  --  106  CO2 33*  --  31  GLUCOSE 172*  --  138*  BUN 61*  --  44*  CREATININE 0.87  --  0.75  CALCIUM 9.8  --  9.2    ABG: No results for input(s): "PHART", "PCO2ART", "PO2ART", "HCO3", "O2SAT" in the last 168 hours.  Liver Function Tests: No results for input(s): "AST", "ALT", "ALKPHOS", "BILITOT", "PROT", "ALBUMIN" in the last 168 hours. No results for input(s): "LIPASE", "AMYLASE" in the last 168 hours. No results for input(s): "AMMONIA" in the last 168 hours.  CBC: Recent Labs  Lab 06/30/22 0214  WBC 5.4  HGB 8.1*  HCT 26.6*  MCV 91.4  PLT 194    Cardiac Enzymes: No results for input(s): "CKTOTAL", "CKMB", "CKMBINDEX", "TROPONINI" in the last 168 hours.  BNP (last 3 results) Recent Labs    04/08/22 0336  BNP 51.6    ProBNP (last 3 results) No results for input(s): "PROBNP"  in the last 8760 hours.  Radiological Exams: No results found.  Assessment/Plan Active Problems:   Sickle cell trait (HCC)   Acute on chronic respiratory failure with hypoxia (HCC)   Pneumonia of right lung due to Pseudomonas species (HCC)   Pleural effusion, right   Tracheostomy dependence (HCC)   Acute on chronic respiratory failure hypoxia plan is going to be to continue with the vent on full support patient is not able to wean Sickle cell trait no change supportive care Pneumonia due to Pseudomonas has been treated Pleural effusion supportive care Trach dependence on the ventilator   I have personally seen and evaluated the patient, evaluated laboratory and imaging results, formulated the assessment and plan and placed orders. The Patient requires high complexity decision making with multiple systems involvement.  Rounds were done with the Respiratory Therapy Director and Staff therapists and discussed with nursing staff also.  Allyne Gee, MD [facility] Pulmonary Critical Care Medicine Sleep Medicine

## 2022-07-05 LAB — URINE CULTURE
Culture: NO GROWTH
Special Requests: NORMAL

## 2022-07-05 LAB — CBC
HCT: 25.8 % — ABNORMAL LOW (ref 36.0–46.0)
Hemoglobin: 8.4 g/dL — ABNORMAL LOW (ref 12.0–15.0)
MCH: 28.7 pg (ref 26.0–34.0)
MCHC: 32.6 g/dL (ref 30.0–36.0)
MCV: 88.1 fL (ref 80.0–100.0)
Platelets: 153 10*3/uL (ref 150–400)
RBC: 2.93 MIL/uL — ABNORMAL LOW (ref 3.87–5.11)
RDW: 14.6 % (ref 11.5–15.5)
WBC: 6.6 10*3/uL (ref 4.0–10.5)
nRBC: 0 % (ref 0.0–0.2)

## 2022-07-05 LAB — BASIC METABOLIC PANEL
Anion gap: 7 (ref 5–15)
BUN: 31 mg/dL — ABNORMAL HIGH (ref 8–23)
CO2: 28 mmol/L (ref 22–32)
Calcium: 9.4 mg/dL (ref 8.9–10.3)
Chloride: 107 mmol/L (ref 98–111)
Creatinine, Ser: 0.71 mg/dL (ref 0.44–1.00)
GFR, Estimated: >60 mL/min (ref >60)
Glucose, Bld: 134 mg/dL — ABNORMAL HIGH (ref 70–99)
Potassium: 4 mmol/L (ref 3.5–5.1)
Sodium: 142 mmol/L (ref 135–145)

## 2022-07-05 LAB — URINALYSIS, ROUTINE W REFLEX MICROSCOPIC
Bilirubin Urine: NEGATIVE
Glucose, UA: NEGATIVE mg/dL
Ketones, ur: NEGATIVE mg/dL
Nitrite: NEGATIVE
Protein, ur: 100 mg/dL — AB
RBC / HPF: >50 RBC/hpf — ABNORMAL HIGH (ref 0–5)
Specific Gravity, Urine: 1.013 (ref 1.005–1.030)
WBC, UA: >50 WBC/hpf — ABNORMAL HIGH (ref 0–5)
pH: 7 (ref 5.0–8.0)

## 2022-07-05 NOTE — Progress Notes (Signed)
Pulmonary [place]   PULMONARY CRITICAL CARE SERVICE  PROGRESS NOTE     Sheila Fernandez  [id number]  DOB: 1951/06/16   DOA: 05/26/2022  Referring Physician: Satira Sark, MD  HPI: Sheila Fernandez is a 71 y.o. female being followed for ventilator/airway/oxygen weaning Acute on Chronic Respiratory Failure. ***  Medications: Reviewed on Rounds  Physical Exam:  Vitals: ***  Ventilator Settings ***  General: Comfortable at this time Neck: supple Cardiovascular: no malignant arrhythmias Respiratory: *** Skin: no rash seen on limited exam Musculoskeletal: No gross abnormality Psychiatric:unable to assess Neurologic:no involuntary movements         Lab Data:   Basic Metabolic Panel: Recent Labs  Lab 06/30/22 0214 07/01/22 0133 07/03/22 0148 07/05/22 0130  NA 150* 151* 141 142  K 4.1  --  3.8 4.0  CL 113*  --  106 107  CO2 33*  --  31 28  GLUCOSE 172*  --  138* 134*  BUN 61*  --  44* 31*  CREATININE 0.87  --  0.75 0.71  CALCIUM 9.8  --  9.2 9.4    ABG: No results for input(s): "PHART", "PCO2ART", "PO2ART", "HCO3", "O2SAT" in the last 168 hours.  Liver Function Tests: No results for input(s): "AST", "ALT", "ALKPHOS", "BILITOT", "PROT", "ALBUMIN" in the last 168 hours. No results for input(s): "LIPASE", "AMYLASE" in the last 168 hours. No results for input(s): "AMMONIA" in the last 168 hours.  CBC: Recent Labs  Lab 06/30/22 0214 07/05/22 0130  WBC 5.4 6.6  HGB 8.1* 8.4*  HCT 26.6* 25.8*  MCV 91.4 88.1  PLT 194 153    Cardiac Enzymes: No results for input(s): "CKTOTAL", "CKMB", "CKMBINDEX", "TROPONINI" in the last 168 hours.  BNP (last 3 results) Recent Labs    04/08/22 0336  BNP 51.6    ProBNP (last 3 results) No results for input(s): "PROBNP" in the last 8760 hours.  Radiological Exams: DG Chest Port 1 View  Result Date: 07/04/2022 CLINICAL DATA:  Pulmonary infiltrates EXAM:  PORTABLE CHEST 1 VIEW COMPARISON:  Radiographs 06/08/2022 FINDINGS: Unchanged tracheostomy. Increased airspace opacity in the right cardiophrenic angle. Increased curvilinear opacities in the left lung base. No pleural effusion or pneumothorax. Stable cardiomediastinal silhouette. No acute osseous abnormality. IMPRESSION: Patchy airspace opacity in the right cardiophrenic angle suspicious for pneumonia. Left basilar airspace opacities favored to represent atelectasis though pneumonia is difficult to exclude. Electronically Signed   By: Placido Sou M.D.   On: 07/04/2022 12:37    Assessment/Plan Active Problems:   Sickle cell trait (HCC)   Acute on chronic respiratory failure with hypoxia (HCC)   Pneumonia of right lung due to Pseudomonas species (HCC)   Pleural effusion, right   Tracheostomy dependence (HCC)   *** *** *** *** ***   I have personally seen and evaluated the patient, evaluated laboratory and imaging results, formulated the assessment and plan and placed orders. The Patient requires high complexity decision making with multiple systems involvement.  Rounds were done with the Respiratory Therapy Director and Staff therapists and discussed with nursing staff also.  Allyne Gee, MD [facility] Pulmonary Critical Care Medicine Sleep Medicine

## 2022-07-06 LAB — BASIC METABOLIC PANEL
Anion gap: 6 (ref 5–15)
BUN: 31 mg/dL — ABNORMAL HIGH (ref 8–23)
CO2: 28 mmol/L (ref 22–32)
Calcium: 9.6 mg/dL (ref 8.9–10.3)
Chloride: 107 mmol/L (ref 98–111)
Creatinine, Ser: 0.82 mg/dL (ref 0.44–1.00)
GFR, Estimated: >60 mL/min (ref >60)
Glucose, Bld: 98 mg/dL (ref 70–99)
Potassium: 5 mmol/L (ref 3.5–5.1)
Sodium: 141 mmol/L (ref 135–145)

## 2022-07-06 LAB — CBC
HCT: 28.2 % — ABNORMAL LOW (ref 36.0–46.0)
Hemoglobin: 9.1 g/dL — ABNORMAL LOW (ref 12.0–15.0)
MCH: 28.5 pg (ref 26.0–34.0)
MCHC: 32.3 g/dL (ref 30.0–36.0)
MCV: 88.4 fL (ref 80.0–100.0)
Platelets: 151 10*3/uL (ref 150–400)
RBC: 3.19 MIL/uL — ABNORMAL LOW (ref 3.87–5.11)
RDW: 14.8 % (ref 11.5–15.5)
WBC: 12.3 10*3/uL — ABNORMAL HIGH (ref 4.0–10.5)
nRBC: 0 % (ref 0.0–0.2)

## 2022-07-06 NOTE — Progress Notes (Signed)
Pulmonary [place]   PULMONARY CRITICAL CARE SERVICE  PROGRESS NOTE     Sheila Fernandez  ZOX:[id number]  DOB: Apr 21, 1951   DOA: 05/26/2022  Referring Physician: Satira Sark, MD  HPI: Sheila Fernandez is a 71 y.o. female being followed for ventilator/airway/oxygen weaning Acute on Chronic Respiratory Failure.  Patient is on the ventilator and full support has not been tolerating weaning disorder to continue her back on the vent  Medications: Reviewed on Rounds  Physical Exam:  Vitals: Temperature is 97.5 pulse 78 respiratory is 18 blood pressure 100/60 saturations 97%  Ventilator Settings on assist control FiO2 28% tidal volume 400 PEEP of 5  General: Comfortable at this time Neck: supple Cardiovascular: no malignant arrhythmias Respiratory: Scattered rhonchi expansion is equal Skin: no rash seen on limited exam Musculoskeletal: No gross abnormality Psychiatric:unable to assess Neurologic:no involuntary movements         Lab Data:   Basic Metabolic Panel: Recent Labs  Lab 06/30/22 0214 07/01/22 0133 07/03/22 0148 07/05/22 0130 07/06/22 0149  NA 150* 151* 141 142 141  K 4.1  --  3.8 4.0 5.0  CL 113*  --  106 107 107  CO2 33*  --  '31 28 28  '$ GLUCOSE 172*  --  138* 134* 98  BUN 61*  --  44* 31* 31*  CREATININE 0.87  --  0.75 0.71 0.82  CALCIUM 9.8  --  9.2 9.4 9.6    ABG: No results for input(s): "PHART", "PCO2ART", "PO2ART", "HCO3", "O2SAT" in the last 168 hours.  Liver Function Tests: No results for input(s): "AST", "ALT", "ALKPHOS", "BILITOT", "PROT", "ALBUMIN" in the last 168 hours. No results for input(s): "LIPASE", "AMYLASE" in the last 168 hours. No results for input(s): "AMMONIA" in the last 168 hours.  CBC: Recent Labs  Lab 06/30/22 0214 07/05/22 0130 07/06/22 0149  WBC 5.4 6.6 12.3*  HGB 8.1* 8.4* 9.1*  HCT 26.6* 25.8* 28.2*  MCV 91.4 88.1 88.4  PLT 194 153 151    Cardiac  Enzymes: No results for input(s): "CKTOTAL", "CKMB", "CKMBINDEX", "TROPONINI" in the last 168 hours.  BNP (last 3 results) Recent Labs    04/08/22 0336  BNP 51.6    ProBNP (last 3 results) No results for input(s): "PROBNP" in the last 8760 hours.  Radiological Exams: DG Chest Port 1 View  Result Date: 07/04/2022 CLINICAL DATA:  Pulmonary infiltrates EXAM: PORTABLE CHEST 1 VIEW COMPARISON:  Radiographs 06/08/2022 FINDINGS: Unchanged tracheostomy. Increased airspace opacity in the right cardiophrenic angle. Increased curvilinear opacities in the left lung base. No pleural effusion or pneumothorax. Stable cardiomediastinal silhouette. No acute osseous abnormality. IMPRESSION: Patchy airspace opacity in the right cardiophrenic angle suspicious for pneumonia. Left basilar airspace opacities favored to represent atelectasis though pneumonia is difficult to exclude. Electronically Signed   By: Placido Sou M.D.   On: 07/04/2022 12:37    Assessment/Plan Active Problems:   Sickle cell trait (HCC)   Acute on chronic respiratory failure with hypoxia (HCC)   Pneumonia of right lung due to Pseudomonas species (HCC)   Pleural effusion, right   Tracheostomy dependence (HCC)   Acute on chronic respiratory failure with hypoxia we will continue with full support currently on assist control mode Sickle cell trait no change we will continue to monitor Pneumonia of the right lung supportive care we will continue to follow along Tracheostomy remains in place Well effusion overall no change supportive care   I have personally seen and evaluated  the patient, evaluated laboratory and imaging results, formulated the assessment and plan and placed orders. The Patient requires high complexity decision making with multiple systems involvement.  Rounds were done with the Respiratory Therapy Director and Staff therapists and discussed with nursing staff also.  Allyne Gee, MD [facility] Pulmonary Critical  Care Medicine Sleep Medicine

## 2022-07-07 LAB — CULTURE, RESPIRATORY W GRAM STAIN: Special Requests: NORMAL

## 2022-07-07 LAB — URINE CULTURE: Culture: 20000 — AB

## 2022-07-07 NOTE — Progress Notes (Signed)
Pulmonary [place]   PULMONARY CRITICAL CARE SERVICE  PROGRESS NOTE     Sheila Fernandez  [id number]  DOB: 09-25-1951   DOA: 05/26/2022  Referring Physician: Satira Sark, MD  HPI: Sheila Fernandez is a 71 y.o. female being followed for ventilator/airway/oxygen weaning Acute on Chronic Respiratory Failure.  Patient currently is on assist control has been on 28% FiO2 with a PEEP 5  Medications: Reviewed on Rounds  Physical Exam:  Vitals: Temperature is 96.5 pulse 72 respiratory 26 blood pressure 109/67 saturations 100%  Ventilator Settings on assist control FiO2 is 28% tidal volume 400 PEEP of 5  General: Comfortable at this time Neck: supple Cardiovascular: no malignant arrhythmias Respiratory: Scattered rhonchi expansion is equal Skin: no rash seen on limited exam Musculoskeletal: No gross abnormality Psychiatric:unable to assess Neurologic:no involuntary movements         Lab Data:   Basic Metabolic Panel: Recent Labs  Lab 07/01/22 0133 07/03/22 0148 07/05/22 0130 07/06/22 0149  NA 151* 141 142 141  K  --  3.8 4.0 5.0  CL  --  106 107 107  CO2  --  '31 28 28  '$ GLUCOSE  --  138* 134* 98  BUN  --  44* 31* 31*  CREATININE  --  0.75 0.71 0.82  CALCIUM  --  9.2 9.4 9.6    ABG: No results for input(s): "PHART", "PCO2ART", "PO2ART", "HCO3", "O2SAT" in the last 168 hours.  Liver Function Tests: No results for input(s): "AST", "ALT", "ALKPHOS", "BILITOT", "PROT", "ALBUMIN" in the last 168 hours. No results for input(s): "LIPASE", "AMYLASE" in the last 168 hours. No results for input(s): "AMMONIA" in the last 168 hours.  CBC: Recent Labs  Lab 07/05/22 0130 07/06/22 0149  WBC 6.6 12.3*  HGB 8.4* 9.1*  HCT 25.8* 28.2*  MCV 88.1 88.4  PLT 153 151    Cardiac Enzymes: No results for input(s): "CKTOTAL", "CKMB", "CKMBINDEX", "TROPONINI" in the last 168 hours.  BNP (last 3 results) Recent Labs     04/08/22 0336  BNP 51.6    ProBNP (last 3 results) No results for input(s): "PROBNP" in the last 8760 hours.  Radiological Exams: No results found.  Assessment/Plan Active Problems:   Sickle cell trait (HCC)   Acute on chronic respiratory failure with hypoxia (HCC)   Pneumonia of right lung due to Pseudomonas species (HCC)   Pleural effusion, right   Tracheostomy dependence (HCC)   Acute on chronic respiratory failure hypoxia she is at baseline on the ventilator we will continue to monitor Pneumonia of the right lung supportive care prognosis guarded Pleural effusion no change supportive care we will monitor x-rays Tracheostomy remains in place Sickle cell trait supportive care   I have personally seen and evaluated the patient, evaluated laboratory and imaging results, formulated the assessment and plan and placed orders. The Patient requires high complexity decision making with multiple systems involvement.  Rounds were done with the Respiratory Therapy Director and Staff therapists and discussed with nursing staff also.  Allyne Gee, MD [facility] Pulmonary Critical Care Medicine Sleep Medicine

## 2022-07-08 DIAGNOSIS — D573 Sickle-cell trait: Secondary | ICD-10-CM | POA: Diagnosis not present

## 2022-07-08 DIAGNOSIS — J90 Pleural effusion, not elsewhere classified: Secondary | ICD-10-CM | POA: Diagnosis not present

## 2022-07-08 DIAGNOSIS — J151 Pneumonia due to Pseudomonas: Secondary | ICD-10-CM | POA: Diagnosis not present

## 2022-07-08 DIAGNOSIS — J9621 Acute and chronic respiratory failure with hypoxia: Secondary | ICD-10-CM | POA: Diagnosis not present

## 2022-07-08 LAB — BASIC METABOLIC PANEL
Anion gap: 8 (ref 5–15)
BUN: 33 mg/dL — ABNORMAL HIGH (ref 8–23)
CO2: 27 mmol/L (ref 22–32)
Calcium: 9.3 mg/dL (ref 8.9–10.3)
Chloride: 113 mmol/L — ABNORMAL HIGH (ref 98–111)
Creatinine, Ser: 0.76 mg/dL (ref 0.44–1.00)
GFR, Estimated: >60 mL/min (ref >60)
Glucose, Bld: 142 mg/dL — ABNORMAL HIGH (ref 70–99)
Potassium: 3.8 mmol/L (ref 3.5–5.1)
Sodium: 148 mmol/L — ABNORMAL HIGH (ref 135–145)

## 2022-07-08 LAB — CULTURE, BLOOD (ROUTINE X 2)
Culture: NO GROWTH
Culture: NO GROWTH
Special Requests: ADEQUATE
Special Requests: ADEQUATE

## 2022-07-08 LAB — CBC
HCT: 22.7 % — ABNORMAL LOW (ref 36.0–46.0)
Hemoglobin: 7.4 g/dL — ABNORMAL LOW (ref 12.0–15.0)
MCH: 28.8 pg (ref 26.0–34.0)
MCHC: 32.6 g/dL (ref 30.0–36.0)
MCV: 88.3 fL (ref 80.0–100.0)
Platelets: 151 10*3/uL (ref 150–400)
RBC: 2.57 MIL/uL — ABNORMAL LOW (ref 3.87–5.11)
RDW: 14.9 % (ref 11.5–15.5)
WBC: 5 10*3/uL (ref 4.0–10.5)
nRBC: 0 % (ref 0.0–0.2)

## 2022-07-08 NOTE — Progress Notes (Signed)
Pulmonary [place]   PULMONARY CRITICAL CARE SERVICE  PROGRESS NOTE     TAWONNA ESQUER  [id number]  DOB: 10/03/1950   DOA: 05/26/2022  Referring Physician: Satira Sark, MD  HPI: Sheila Fernandez is a 71 y.o. female being followed for ventilator/airway/oxygen weaning Acute on Chronic Respiratory Failure.  Patient is on assist control full support currently 28% FiO2  Medications: Reviewed on Rounds  Physical Exam:  Vitals: Temperature is 97.5 pulse 71 respiratory 20 blood pressure 106/62 saturations 100%  Ventilator Settings assist-control FiO2 28% tidal volume 400 PEEP of 5  General: Comfortable at this time Neck: supple Cardiovascular: no malignant arrhythmias Respiratory: No rhonchi no rales are noted at this time Skin: no rash seen on limited exam Musculoskeletal: No gross abnormality Psychiatric:unable to assess Neurologic:no involuntary movements         Lab Data:   Basic Metabolic Panel: Recent Labs  Lab 07/03/22 0148 07/05/22 0130 07/06/22 0149 07/08/22 0304  NA 141 142 141 148*  K 3.8 4.0 5.0 3.8  CL 106 107 107 113*  CO2 '31 28 28 27  '$ GLUCOSE 138* 134* 98 142*  BUN 44* 31* 31* 33*  CREATININE 0.75 0.71 0.82 0.76  CALCIUM 9.2 9.4 9.6 9.3    ABG: No results for input(s): "PHART", "PCO2ART", "PO2ART", "HCO3", "O2SAT" in the last 168 hours.  Liver Function Tests: No results for input(s): "AST", "ALT", "ALKPHOS", "BILITOT", "PROT", "ALBUMIN" in the last 168 hours. No results for input(s): "LIPASE", "AMYLASE" in the last 168 hours. No results for input(s): "AMMONIA" in the last 168 hours.  CBC: Recent Labs  Lab 07/05/22 0130 07/06/22 0149 07/08/22 0304  WBC 6.6 12.3* 5.0  HGB 8.4* 9.1* 7.4*  HCT 25.8* 28.2* 22.7*  MCV 88.1 88.4 88.3  PLT 153 151 151    Cardiac Enzymes: No results for input(s): "CKTOTAL", "CKMB", "CKMBINDEX", "TROPONINI" in the last 168 hours.  BNP (last 3  results) Recent Labs    04/08/22 0336  BNP 51.6    ProBNP (last 3 results) No results for input(s): "PROBNP" in the last 8760 hours.  Radiological Exams: No results found.  Assessment/Plan Active Problems:   Sickle cell trait (HCC)   Acute on chronic respiratory failure with hypoxia (HCC)   Pneumonia of right lung due to Pseudomonas species (HCC)   Pleural effusion, right   Tracheostomy dependence (HCC)   Acute on chronic respiratory failure hypoxia plan is to continue with full support ventilator she is not able to wean Sickle cell trait at baseline right now Pneumonia due to Pseudomonas treated improved clinically Trach dependent supportive care Pleural effusion follow-up x-rays as needed   I have personally seen and evaluated the patient, evaluated laboratory and imaging results, formulated the assessment and plan and placed orders. The Patient requires high complexity decision making with multiple systems involvement.  Rounds were done with the Respiratory Therapy Director and Staff therapists and discussed with nursing staff also.  Allyne Gee, MD [facility] Pulmonary Critical Care Medicine Sleep Medicine

## 2022-07-09 DIAGNOSIS — D573 Sickle-cell trait: Secondary | ICD-10-CM | POA: Diagnosis not present

## 2022-07-09 DIAGNOSIS — J151 Pneumonia due to Pseudomonas: Secondary | ICD-10-CM | POA: Diagnosis not present

## 2022-07-09 DIAGNOSIS — J90 Pleural effusion, not elsewhere classified: Secondary | ICD-10-CM | POA: Diagnosis not present

## 2022-07-09 DIAGNOSIS — J9621 Acute and chronic respiratory failure with hypoxia: Secondary | ICD-10-CM | POA: Diagnosis not present

## 2022-07-09 LAB — SODIUM: Sodium: 148 mmol/L — ABNORMAL HIGH (ref 135–145)

## 2022-07-09 NOTE — Progress Notes (Signed)
Pulmonary [place]   PULMONARY CRITICAL CARE SERVICE  PROGRESS NOTE     Sheila Fernandez  [id number]  DOB: 1951/08/15   DOA: 05/26/2022  Referring Physician: Satira Sark, MD  HPI: Sheila Fernandez is a 71 y.o. female being followed for ventilator/airway/oxygen weaning Acute on Chronic Respiratory Failure.  Patient is on full support on the ventilator on assist control mode currently on 28% FiO2  Medications: Reviewed on Rounds  Physical Exam:  Vitals: Temperature is 97.0 pulse of 59 respiratory is 19 blood pressure 157/71 saturations 100%  Ventilator Settings assist-control FiO2 is 28% tidal volume 400 PEEP of 5  General: Comfortable at this time Neck: supple Cardiovascular: no malignant arrhythmias Respiratory: Scattered rhonchi expansion is equal at this time Skin: no rash seen on limited exam Musculoskeletal: No gross abnormality Psychiatric:unable to assess Neurologic:no involuntary movements         Lab Data:   Basic Metabolic Panel: Recent Labs  Lab 07/03/22 0148 07/05/22 0130 07/06/22 0149 07/08/22 0304 07/09/22 0419  NA 141 142 141 148* 148*  K 3.8 4.0 5.0 3.8  --   CL 106 107 107 113*  --   CO2 '31 28 28 27  '$ --   GLUCOSE 138* 134* 98 142*  --   BUN 44* 31* 31* 33*  --   CREATININE 0.75 0.71 0.82 0.76  --   CALCIUM 9.2 9.4 9.6 9.3  --     ABG: No results for input(s): "PHART", "PCO2ART", "PO2ART", "HCO3", "O2SAT" in the last 168 hours.  Liver Function Tests: No results for input(s): "AST", "ALT", "ALKPHOS", "BILITOT", "PROT", "ALBUMIN" in the last 168 hours. No results for input(s): "LIPASE", "AMYLASE" in the last 168 hours. No results for input(s): "AMMONIA" in the last 168 hours.  CBC: Recent Labs  Lab 07/05/22 0130 07/06/22 0149 07/08/22 0304  WBC 6.6 12.3* 5.0  HGB 8.4* 9.1* 7.4*  HCT 25.8* 28.2* 22.7*  MCV 88.1 88.4 88.3  PLT 153 151 151    Cardiac Enzymes: No results  for input(s): "CKTOTAL", "CKMB", "CKMBINDEX", "TROPONINI" in the last 168 hours.  BNP (last 3 results) Recent Labs    04/08/22 0336  BNP 51.6    ProBNP (last 3 results) No results for input(s): "PROBNP" in the last 8760 hours.  Radiological Exams: No results found.  Assessment/Plan Active Problems:   Sickle cell trait (HCC)   Acute on chronic respiratory failure with hypoxia (HCC)   Pneumonia of right lung due to Pseudomonas species (HCC)   Pleural effusion, right   Tracheostomy dependence (HCC)   Acute on chronic respiratory failure with hypoxia we will continue with assist control mode titrate oxygen as tolerated continue secretion management pulmonary toilet. Sickle cell trait no change Pneumonia due to Pseudomonas has been treated Tracheostomy remains in place Pleural effusion supportive care   I have personally seen and evaluated the patient, evaluated laboratory and imaging results, formulated the assessment and plan and placed orders. The Patient requires high complexity decision making with multiple systems involvement.  Rounds were done with the Respiratory Therapy Director and Staff therapists and discussed with nursing staff also.  Allyne Gee, MD [facility] Pulmonary Critical Care Medicine Sleep Medicine

## 2022-07-10 DIAGNOSIS — J90 Pleural effusion, not elsewhere classified: Secondary | ICD-10-CM | POA: Diagnosis not present

## 2022-07-10 DIAGNOSIS — J151 Pneumonia due to Pseudomonas: Secondary | ICD-10-CM | POA: Diagnosis not present

## 2022-07-10 DIAGNOSIS — D573 Sickle-cell trait: Secondary | ICD-10-CM | POA: Diagnosis not present

## 2022-07-10 DIAGNOSIS — J9621 Acute and chronic respiratory failure with hypoxia: Secondary | ICD-10-CM | POA: Diagnosis not present

## 2022-07-10 LAB — BASIC METABOLIC PANEL
Anion gap: 6 (ref 5–15)
BUN: 26 mg/dL — ABNORMAL HIGH (ref 8–23)
CO2: 26 mmol/L (ref 22–32)
Calcium: 9.3 mg/dL (ref 8.9–10.3)
Chloride: 113 mmol/L — ABNORMAL HIGH (ref 98–111)
Creatinine, Ser: 0.55 mg/dL (ref 0.44–1.00)
GFR, Estimated: >60 mL/min (ref >60)
Glucose, Bld: 148 mg/dL — ABNORMAL HIGH (ref 70–99)
Potassium: 4 mmol/L (ref 3.5–5.1)
Sodium: 145 mmol/L (ref 135–145)

## 2022-07-10 LAB — CBC
HCT: 23.5 % — ABNORMAL LOW (ref 36.0–46.0)
Hemoglobin: 7.3 g/dL — ABNORMAL LOW (ref 12.0–15.0)
MCH: 28 pg (ref 26.0–34.0)
MCHC: 31.1 g/dL (ref 30.0–36.0)
MCV: 90 fL (ref 80.0–100.0)
Platelets: 178 10*3/uL (ref 150–400)
RBC: 2.61 MIL/uL — ABNORMAL LOW (ref 3.87–5.11)
RDW: 14.6 % (ref 11.5–15.5)
WBC: 5.3 10*3/uL (ref 4.0–10.5)
nRBC: 0 % (ref 0.0–0.2)

## 2022-07-10 NOTE — Progress Notes (Signed)
Pulmonary [place]   PULMONARY CRITICAL CARE SERVICE  PROGRESS NOTE     Sheila Fernandez  [id number]  DOB: 11/01/1950   DOA: 05/26/2022  Referring Physician: Satira Sark, MD  HPI: Sheila Fernandez is a 71 y.o. female being followed for ventilator/airway/oxygen weaning Acute on Chronic Respiratory Failure.  Patient currently is on assist control mode has been on 28% FiO2 full support at baseline  Medications: Reviewed on Rounds  Physical Exam:  Vitals: Temperature is 97.6 pulse 54 respiratory is 25 blood pressure 105/64 saturations 100%  Ventilator Settings on assist control FiO2 is 28% tidal volume 400 PEEP 5  General: Comfortable at this time Neck: supple Cardiovascular: no malignant arrhythmias Respiratory: No rhonchi no rales are noted at this time Skin: no rash seen on limited exam Musculoskeletal: No gross abnormality Psychiatric:unable to assess Neurologic:no involuntary movements         Lab Data:   Basic Metabolic Panel: Recent Labs  Lab 07/05/22 0130 07/06/22 0149 07/08/22 0304 07/09/22 0419 07/10/22 0135  NA 142 141 148* 148* 145  K 4.0 5.0 3.8  --  4.0  CL 107 107 113*  --  113*  CO2 '28 28 27  '$ --  26  GLUCOSE 134* 98 142*  --  148*  BUN 31* 31* 33*  --  26*  CREATININE 0.71 0.82 0.76  --  0.55  CALCIUM 9.4 9.6 9.3  --  9.3    ABG: No results for input(s): "PHART", "PCO2ART", "PO2ART", "HCO3", "O2SAT" in the last 168 hours.  Liver Function Tests: No results for input(s): "AST", "ALT", "ALKPHOS", "BILITOT", "PROT", "ALBUMIN" in the last 168 hours. No results for input(s): "LIPASE", "AMYLASE" in the last 168 hours. No results for input(s): "AMMONIA" in the last 168 hours.  CBC: Recent Labs  Lab 07/05/22 0130 07/06/22 0149 07/08/22 0304 07/10/22 0135  WBC 6.6 12.3* 5.0 5.3  HGB 8.4* 9.1* 7.4* 7.3*  HCT 25.8* 28.2* 22.7* 23.5*  MCV 88.1 88.4 88.3 90.0  PLT 153 151 151 178     Cardiac Enzymes: No results for input(s): "CKTOTAL", "CKMB", "CKMBINDEX", "TROPONINI" in the last 168 hours.  BNP (last 3 results) Recent Labs    04/08/22 0336  BNP 51.6    ProBNP (last 3 results) No results for input(s): "PROBNP" in the last 8760 hours.  Radiological Exams: No results found.  Assessment/Plan Active Problems:   Sickle cell trait (HCC)   Acute on chronic respiratory failure with hypoxia (HCC)   Pneumonia of right lung due to Pseudomonas species (HCC)   Pleural effusion, right   Tracheostomy dependence (HCC)   Acute on chronic respiratory failure hypoxia we will continue with full support patient is currently on assist control at baseline Sickle cell trait no change we will continue to follow along closely. Pneumonia due to Pseudomonas has been treated we will continue to monitor Tracheostomy remains in place Pleural effusion at baseline   I have personally seen and evaluated the patient, evaluated laboratory and imaging results, formulated the assessment and plan and placed orders. The Patient requires high complexity decision making with multiple systems involvement.  Rounds were done with the Respiratory Therapy Director and Staff therapists and discussed with nursing staff also.  Allyne Gee, MD [facility] Pulmonary Critical Care Medicine Sleep Medicine

## 2022-07-11 DIAGNOSIS — D573 Sickle-cell trait: Secondary | ICD-10-CM | POA: Diagnosis not present

## 2022-07-11 DIAGNOSIS — J90 Pleural effusion, not elsewhere classified: Secondary | ICD-10-CM | POA: Diagnosis not present

## 2022-07-11 DIAGNOSIS — J9621 Acute and chronic respiratory failure with hypoxia: Secondary | ICD-10-CM | POA: Diagnosis not present

## 2022-07-11 DIAGNOSIS — J151 Pneumonia due to Pseudomonas: Secondary | ICD-10-CM | POA: Diagnosis not present

## 2022-07-11 NOTE — Progress Notes (Signed)
Pulmonary [place]   PULMONARY CRITICAL CARE SERVICE  PROGRESS NOTE     Sheila Fernandez  [id number]  DOB: 13-Dec-1950   DOA: 05/26/2022  Referring Physician: Satira Sark, MD  HPI: [name] is a 71 y.o. female being followed for ventilator/airway/oxygen weaning Acute on Chronic Respiratory Failure.  Patient is on assist control full support at this time currently on 28% FiO2 remains on the ventilator  Medications: Reviewed on Rounds  Physical Exam:  Vitals: Temperature is 97.7 pulse 60 respiratory 14 blood pressure is 109/67 saturations 99%  Ventilator Settings assist-control FiO2 is 28% PEEP 5 tidal volume 400  General: Comfortable at this time Neck: supple Cardiovascular: no malignant arrhythmias Respiratory: Scattered rhonchi expansion is equal Skin: no rash seen on limited exam Musculoskeletal: No gross abnormality Psychiatric:unable to assess Neurologic:no involuntary movements         Lab Data:   Basic Metabolic Panel: Recent Labs  Lab 07/05/22 0130 07/06/22 0149 07/08/22 0304 07/09/22 0419 07/10/22 0135  NA 142 141 148* 148* 145  K 4.0 5.0 3.8  --  4.0  CL 107 107 113*  --  113*  CO2 '28 28 27  '$ --  26  GLUCOSE 134* 98 142*  --  148*  BUN 31* 31* 33*  --  26*  CREATININE 0.71 0.82 0.76  --  0.55  CALCIUM 9.4 9.6 9.3  --  9.3    ABG: No results for input(s): "PHART", "PCO2ART", "PO2ART", "HCO3", "O2SAT" in the last 168 hours.  Liver Function Tests: No results for input(s): "AST", "ALT", "ALKPHOS", "BILITOT", "PROT", "ALBUMIN" in the last 168 hours. No results for input(s): "LIPASE", "AMYLASE" in the last 168 hours. No results for input(s): "AMMONIA" in the last 168 hours.  CBC: Recent Labs  Lab 07/05/22 0130 07/06/22 0149 07/08/22 0304 07/10/22 0135  WBC 6.6 12.3* 5.0 5.3  HGB 8.4* 9.1* 7.4* 7.3*  HCT 25.8* 28.2* 22.7* 23.5*  MCV 88.1 88.4 88.3 90.0  PLT 153 151 151 178     Cardiac Enzymes: No results for input(s): "CKTOTAL", "CKMB", "CKMBINDEX", "TROPONINI" in the last 168 hours.  BNP (last 3 results) Recent Labs    04/08/22 0336  BNP 51.6    ProBNP (last 3 results) No results for input(s): "PROBNP" in the last 8760 hours.  Radiological Exams: No results found.  Assessment/Plan Active Problems:   Sickle cell trait (HCC)   Acute on chronic respiratory failure with hypoxia (HCC)   Pneumonia of right lung due to Pseudomonas species (HCC)   Pleural effusion, right   Tracheostomy dependence (HCC)   Acute on chronic respiratory failure hypoxia we will continue with full support currently on full mechanical support Sickle cell trait no change we will continue to follow along closely. Pneumonia of the right lung supportive care and resolution Pleural effusion prognosis guarded monitor x-rays Tracheostomy remains in place   I have personally seen and evaluated the patient, evaluated laboratory and imaging results, formulated the assessment and plan and placed orders. The Patient requires high complexity decision making with multiple systems involvement.  Rounds were done with the Respiratory Therapy Director and Staff therapists and discussed with nursing staff also.  Allyne Gee, MD [facility] Pulmonary Critical Care Medicine Sleep Medicine

## 2022-07-12 DIAGNOSIS — J151 Pneumonia due to Pseudomonas: Secondary | ICD-10-CM | POA: Diagnosis not present

## 2022-07-12 DIAGNOSIS — D573 Sickle-cell trait: Secondary | ICD-10-CM | POA: Diagnosis not present

## 2022-07-12 DIAGNOSIS — J9621 Acute and chronic respiratory failure with hypoxia: Secondary | ICD-10-CM | POA: Diagnosis not present

## 2022-07-12 DIAGNOSIS — J90 Pleural effusion, not elsewhere classified: Secondary | ICD-10-CM | POA: Diagnosis not present

## 2022-07-12 LAB — CBC
HCT: 23.5 % — ABNORMAL LOW (ref 36.0–46.0)
Hemoglobin: 7.7 g/dL — ABNORMAL LOW (ref 12.0–15.0)
MCH: 28.6 pg (ref 26.0–34.0)
MCHC: 32.8 g/dL (ref 30.0–36.0)
MCV: 87.4 fL (ref 80.0–100.0)
Platelets: 217 10*3/uL (ref 150–400)
RBC: 2.69 MIL/uL — ABNORMAL LOW (ref 3.87–5.11)
RDW: 14.6 % (ref 11.5–15.5)
WBC: 7.3 10*3/uL (ref 4.0–10.5)
nRBC: 0 % (ref 0.0–0.2)

## 2022-07-12 LAB — BASIC METABOLIC PANEL
Anion gap: 5 (ref 5–15)
BUN: 19 mg/dL (ref 8–23)
CO2: 26 mmol/L (ref 22–32)
Calcium: 9.5 mg/dL (ref 8.9–10.3)
Chloride: 113 mmol/L — ABNORMAL HIGH (ref 98–111)
Creatinine, Ser: 0.52 mg/dL (ref 0.44–1.00)
GFR, Estimated: >60 mL/min (ref >60)
Glucose, Bld: 131 mg/dL — ABNORMAL HIGH (ref 70–99)
Potassium: 3.8 mmol/L (ref 3.5–5.1)
Sodium: 144 mmol/L (ref 135–145)

## 2022-07-12 NOTE — Progress Notes (Signed)
Pulmonary [place]   PULMONARY CRITICAL CARE SERVICE  PROGRESS NOTE     Sheila Fernandez  [id number]  DOB: March 20, 1951   DOA: 05/26/2022  Referring Physician: Satira Sark, MD  HPI: Sheila Fernandez is a 71 y.o. female being followed for ventilator/airway/oxygen weaning Acute on Chronic Respiratory Failure.  Patient is currently on assist-control mode has been on 28% FiO2 saturations are good  Medications: Reviewed on Rounds  Physical Exam:  Vitals: Temperature is 97.3 pulse 87 respiratory 25 blood pressure is 152/78 saturations 94%  Ventilator Settings on assist control FiO2 is 28% tidal volume 400 PEEP 5  General: Comfortable at this time Neck: supple Cardiovascular: no malignant arrhythmias Respiratory: Scattered rhonchi expansion is equal Skin: no rash seen on limited exam Musculoskeletal: No gross abnormality Psychiatric:unable to assess Neurologic:no involuntary movements         Lab Data:   Basic Metabolic Panel: Recent Labs  Lab 07/06/22 0149 07/08/22 0304 07/09/22 0419 07/10/22 0135 07/12/22 0128  NA 141 148* 148* 145 144  K 5.0 3.8  --  4.0 3.8  CL 107 113*  --  113* 113*  CO2 28 27  --  26 26  GLUCOSE 98 142*  --  148* 131*  BUN 31* 33*  --  26* 19  CREATININE 0.82 0.76  --  0.55 0.52  CALCIUM 9.6 9.3  --  9.3 9.5    ABG: No results for input(s): "PHART", "PCO2ART", "PO2ART", "HCO3", "O2SAT" in the last 168 hours.  Liver Function Tests: No results for input(s): "AST", "ALT", "ALKPHOS", "BILITOT", "PROT", "ALBUMIN" in the last 168 hours. No results for input(s): "LIPASE", "AMYLASE" in the last 168 hours. No results for input(s): "AMMONIA" in the last 168 hours.  CBC: Recent Labs  Lab 07/06/22 0149 07/08/22 0304 07/10/22 0135 07/12/22 0128  WBC 12.3* 5.0 5.3 7.3  HGB 9.1* 7.4* 7.3* 7.7*  HCT 28.2* 22.7* 23.5* 23.5*  MCV 88.4 88.3 90.0 87.4  PLT 151 151 178 217    Cardiac  Enzymes: No results for input(s): "CKTOTAL", "CKMB", "CKMBINDEX", "TROPONINI" in the last 168 hours.  BNP (last 3 results) Recent Labs    04/08/22 0336  BNP 51.6    ProBNP (last 3 results) No results for input(s): "PROBNP" in the last 8760 hours.  Radiological Exams: No results found.  Assessment/Plan Active Problems:   Sickle cell trait (HCC)   Acute on chronic respiratory failure with hypoxia (HCC)   Pneumonia of right lung due to Pseudomonas species (HCC)   Pleural effusion, right   Tracheostomy dependence (HCC)   Acute on chronic respiratory failure with hypoxia we will continue with full support on assist control currently patient apparently comfortable Pneumonia of the lung treated with antibiotics clinically is improving Sickle cell trait no change Tracheostomy remains in place Pleural effusion ongoing monitoring   I have personally seen and evaluated the patient, evaluated laboratory and imaging results, formulated the assessment and plan and placed orders. The Patient requires high complexity decision making with multiple systems involvement.  Rounds were done with the Respiratory Therapy Director and Staff therapists and discussed with nursing staff also.  Allyne Gee, MD [facility] Pulmonary Critical Care Medicine Sleep Medicine

## 2022-07-13 DIAGNOSIS — D573 Sickle-cell trait: Secondary | ICD-10-CM | POA: Diagnosis not present

## 2022-07-13 DIAGNOSIS — J9621 Acute and chronic respiratory failure with hypoxia: Secondary | ICD-10-CM | POA: Diagnosis not present

## 2022-07-13 DIAGNOSIS — J151 Pneumonia due to Pseudomonas: Secondary | ICD-10-CM | POA: Diagnosis not present

## 2022-07-13 DIAGNOSIS — J90 Pleural effusion, not elsewhere classified: Secondary | ICD-10-CM | POA: Diagnosis not present

## 2022-07-13 NOTE — Progress Notes (Signed)
Pulmonary [place]   PULMONARY CRITICAL CARE SERVICE  PROGRESS NOTE     Sheila Fernandez  [id number]  DOB: Feb 01, 1951   DOA: 05/26/2022  Referring Physician: Satira Sark, MD  HPI: Sheila Fernandez is a 71 y.o. female being followed for ventilator/airway/oxygen weaning Acute on Chronic Respiratory Failure. ***  Medications: Reviewed on Rounds  Physical Exam:  Vitals: ***  Ventilator Settings ***  General: Comfortable at this time Neck: supple Cardiovascular: no malignant arrhythmias Respiratory: *** Skin: no rash seen on limited exam Musculoskeletal: No gross abnormality Psychiatric:unable to assess Neurologic:no involuntary movements         Lab Data:   Basic Metabolic Panel: Recent Labs  Lab 07/08/22 0304 07/09/22 0419 07/10/22 0135 07/12/22 0128  NA 148* 148* 145 144  K 3.8  --  4.0 3.8  CL 113*  --  113* 113*  CO2 27  --  26 26  GLUCOSE 142*  --  148* 131*  BUN 33*  --  26* 19  CREATININE 0.76  --  0.55 0.52  CALCIUM 9.3  --  9.3 9.5    ABG: No results for input(s): "PHART", "PCO2ART", "PO2ART", "HCO3", "O2SAT" in the last 168 hours.  Liver Function Tests: No results for input(s): "AST", "ALT", "ALKPHOS", "BILITOT", "PROT", "ALBUMIN" in the last 168 hours. No results for input(s): "LIPASE", "AMYLASE" in the last 168 hours. No results for input(s): "AMMONIA" in the last 168 hours.  CBC: Recent Labs  Lab 07/08/22 0304 07/10/22 0135 07/12/22 0128  WBC 5.0 5.3 7.3  HGB 7.4* 7.3* 7.7*  HCT 22.7* 23.5* 23.5*  MCV 88.3 90.0 87.4  PLT 151 178 217    Cardiac Enzymes: No results for input(s): "CKTOTAL", "CKMB", "CKMBINDEX", "TROPONINI" in the last 168 hours.  BNP (last 3 results) Recent Labs    04/08/22 0336  BNP 51.6    ProBNP (last 3 results) No results for input(s): "PROBNP" in the last 8760 hours.  Radiological Exams: No results found.  Assessment/Plan Active Problems:    Sickle cell trait (HCC)   Acute on chronic respiratory failure with hypoxia (HCC)   Pneumonia of right lung due to Pseudomonas species (HCC)   Pleural effusion, right   Tracheostomy dependence (HCC)   *** *** *** *** ***   I have personally seen and evaluated the patient, evaluated laboratory and imaging results, formulated the assessment and plan and placed orders. The Patient requires high complexity decision making with multiple systems involvement.  Rounds were done with the Respiratory Therapy Director and Staff therapists and discussed with nursing staff also.  Allyne Gee, MD [facility] Pulmonary Critical Care Medicine Sleep Medicine

## 2022-07-14 DIAGNOSIS — J9621 Acute and chronic respiratory failure with hypoxia: Secondary | ICD-10-CM | POA: Diagnosis not present

## 2022-07-14 DIAGNOSIS — D573 Sickle-cell trait: Secondary | ICD-10-CM | POA: Diagnosis not present

## 2022-07-14 DIAGNOSIS — J90 Pleural effusion, not elsewhere classified: Secondary | ICD-10-CM | POA: Diagnosis not present

## 2022-07-14 DIAGNOSIS — J151 Pneumonia due to Pseudomonas: Secondary | ICD-10-CM | POA: Diagnosis not present

## 2022-07-14 NOTE — Progress Notes (Signed)
Pulmonary [place]   PULMONARY CRITICAL CARE SERVICE  PROGRESS NOTE     Sheila Fernandez  [id number]  DOB: Feb 07, 1951   DOA: 05/26/2022  Referring Physician: Satira Sark, MD  HPI: Sheila Fernandez is a 71 y.o. female being followed for ventilator/airway/oxygen weaning Acute on Chronic Respiratory Failure.  Patient currently is on assist control mode has been on 28% FiO2 saturations are good  Medications: Reviewed on Rounds  Physical Exam:  Vitals: Temperature 96.0 pulse of 70 respiratory rate 20 blood pressure 110/70  Ventilator Settings 28% FiO2 tidal volume 400 PEEP 5 assist-control  General: Comfortable at this time Neck: supple Cardiovascular: no malignant arrhythmias Respiratory: Coarse rhonchi expansion is equal Skin: no rash seen on limited exam Musculoskeletal: No gross abnormality Psychiatric:unable to assess Neurologic:no involuntary movements         Lab Data:   Basic Metabolic Panel: Recent Labs  Lab 07/08/22 0304 07/09/22 0419 07/10/22 0135 07/12/22 0128  NA 148* 148* 145 144  K 3.8  --  4.0 3.8  CL 113*  --  113* 113*  CO2 27  --  26 26  GLUCOSE 142*  --  148* 131*  BUN 33*  --  26* 19  CREATININE 0.76  --  0.55 0.52  CALCIUM 9.3  --  9.3 9.5    ABG: No results for input(s): "PHART", "PCO2ART", "PO2ART", "HCO3", "O2SAT" in the last 168 hours.  Liver Function Tests: No results for input(s): "AST", "ALT", "ALKPHOS", "BILITOT", "PROT", "ALBUMIN" in the last 168 hours. No results for input(s): "LIPASE", "AMYLASE" in the last 168 hours. No results for input(s): "AMMONIA" in the last 168 hours.  CBC: Recent Labs  Lab 07/08/22 0304 07/10/22 0135 07/12/22 0128  WBC 5.0 5.3 7.3  HGB 7.4* 7.3* 7.7*  HCT 22.7* 23.5* 23.5*  MCV 88.3 90.0 87.4  PLT 151 178 217    Cardiac Enzymes: No results for input(s): "CKTOTAL", "CKMB", "CKMBINDEX", "TROPONINI" in the last 168 hours.  BNP (last  3 results) Recent Labs    04/08/22 0336  BNP 51.6    ProBNP (last 3 results) No results for input(s): "PROBNP" in the last 8760 hours.  Radiological Exams: No results found.  Assessment/Plan Active Problems:   Sickle cell trait (HCC)   Acute on chronic respiratory failure with hypoxia (HCC)   Pneumonia of right lung due to Pseudomonas species (HCC)   Pleural effusion, right   Tracheostomy dependence (HCC)   Acute on chronic respiratory failure hypoxia we will titrate oxygen as tolerated patient is baseline on the ventilator Sickle cell trait no change Pneumonia due to Pseudomonas has been treated Tracheostomy remains in place at this time Pleural effusion supportive care   I have personally seen and evaluated the patient, evaluated laboratory and imaging results, formulated the assessment and plan and placed orders. The Patient requires high complexity decision making with multiple systems involvement.  Rounds were done with the Respiratory Therapy Director and Staff therapists and discussed with nursing staff also.  Allyne Gee, MD [facility] Pulmonary Critical Care Medicine Sleep Medicine

## 2022-07-15 ENCOUNTER — Other Ambulatory Visit (HOSPITAL_COMMUNITY): Payer: Self-pay

## 2022-07-15 DIAGNOSIS — J9621 Acute and chronic respiratory failure with hypoxia: Secondary | ICD-10-CM | POA: Diagnosis not present

## 2022-07-15 DIAGNOSIS — J151 Pneumonia due to Pseudomonas: Secondary | ICD-10-CM | POA: Diagnosis not present

## 2022-07-15 DIAGNOSIS — D573 Sickle-cell trait: Secondary | ICD-10-CM | POA: Diagnosis not present

## 2022-07-15 DIAGNOSIS — J90 Pleural effusion, not elsewhere classified: Secondary | ICD-10-CM | POA: Diagnosis not present

## 2022-07-15 LAB — CBC
HCT: 24.5 % — ABNORMAL LOW (ref 36.0–46.0)
Hemoglobin: 7.6 g/dL — ABNORMAL LOW (ref 12.0–15.0)
MCH: 27.8 pg (ref 26.0–34.0)
MCHC: 31 g/dL (ref 30.0–36.0)
MCV: 89.7 fL (ref 80.0–100.0)
Platelets: 263 10*3/uL (ref 150–400)
RBC: 2.73 MIL/uL — ABNORMAL LOW (ref 3.87–5.11)
RDW: 15.6 % — ABNORMAL HIGH (ref 11.5–15.5)
WBC: 8.9 10*3/uL (ref 4.0–10.5)
nRBC: 0 % (ref 0.0–0.2)

## 2022-07-15 LAB — BASIC METABOLIC PANEL
Anion gap: 7 (ref 5–15)
BUN: 35 mg/dL — ABNORMAL HIGH (ref 8–23)
CO2: 28 mmol/L (ref 22–32)
Calcium: 10.1 mg/dL (ref 8.9–10.3)
Chloride: 111 mmol/L (ref 98–111)
Creatinine, Ser: 0.76 mg/dL (ref 0.44–1.00)
GFR, Estimated: >60 mL/min (ref >60)
Glucose, Bld: 138 mg/dL — ABNORMAL HIGH (ref 70–99)
Potassium: 4.4 mmol/L (ref 3.5–5.1)
Sodium: 146 mmol/L — ABNORMAL HIGH (ref 135–145)

## 2022-07-15 LAB — D-DIMER, QUANTITATIVE: D-Dimer, Quant: 0.62 ug/mL-FEU — ABNORMAL HIGH (ref 0.00–0.50)

## 2022-07-15 NOTE — Progress Notes (Signed)
Pulmonary [place]   PULMONARY CRITICAL CARE SERVICE  PROGRESS NOTE     Sheila Fernandez  [id number]  DOB: 1950/11/14   DOA: 05/26/2022  Referring Physician: Satira Sark, MD  HPI: Sheila Fernandez is a 71 y.o. female being followed for ventilator/airway/oxygen weaning Acute on Chronic Respiratory Failure.  Patient is on assist control full support currently on 28% FiO2  Medications: Reviewed on Rounds  Physical Exam:  Vitals: Temperature is 98 pulse 95 respiratory is 22 blood pressure is 144/86 saturations 100%  Ventilator Settings assist-control FiO2 is 28% tidal volume 400 PEEP 5  General: Comfortable at this time Neck: supple Cardiovascular: no malignant arrhythmias Respiratory: No rhonchi no rales are noted at this time Skin: no rash seen on limited exam Musculoskeletal: No gross abnormality Psychiatric:unable to assess Neurologic:no involuntary movements         Lab Data:   Basic Metabolic Panel: Recent Labs  Lab 07/09/22 0419 07/10/22 0135 07/12/22 0128 07/15/22 0123  NA 148* 145 144 146*  K  --  4.0 3.8 4.4  CL  --  113* 113* 111  CO2  --  '26 26 28  '$ GLUCOSE  --  148* 131* 138*  BUN  --  26* 19 35*  CREATININE  --  0.55 0.52 0.76  CALCIUM  --  9.3 9.5 10.1    ABG: No results for input(s): "PHART", "PCO2ART", "PO2ART", "HCO3", "O2SAT" in the last 168 hours.  Liver Function Tests: No results for input(s): "AST", "ALT", "ALKPHOS", "BILITOT", "PROT", "ALBUMIN" in the last 168 hours. No results for input(s): "LIPASE", "AMYLASE" in the last 168 hours. No results for input(s): "AMMONIA" in the last 168 hours.  CBC: Recent Labs  Lab 07/10/22 0135 07/12/22 0128 07/15/22 0123  WBC 5.3 7.3 8.9  HGB 7.3* 7.7* 7.6*  HCT 23.5* 23.5* 24.5*  MCV 90.0 87.4 89.7  PLT 178 217 263    Cardiac Enzymes: No results for input(s): "CKTOTAL", "CKMB", "CKMBINDEX", "TROPONINI" in the last 168 hours.  BNP  (last 3 results) Recent Labs    04/08/22 0336  BNP 51.6    ProBNP (last 3 results) No results for input(s): "PROBNP" in the last 8760 hours.  Radiological Exams: No results found.  Assessment/Plan Active Problems:   Sickle cell trait (HCC)   Acute on chronic respiratory failure with hypoxia (HCC)   Pneumonia of right lung due to Pseudomonas species (HCC)   Pleural effusion, right   Tracheostomy dependence (HCC)   Acute on chronic respiratory failure hypoxia she is at baseline on the ventilator full support Sickle cell trait no change overall Pneumonia supportive care has been treated Tracheostomy will remain in place Pleural effusion follow x-rays as needed   I have personally seen and evaluated the patient, evaluated laboratory and imaging results, formulated the assessment and plan and placed orders. The Patient requires high complexity decision making with multiple systems involvement.  Rounds were done with the Respiratory Therapy Director and Staff therapists and discussed with nursing staff also.  Allyne Gee, MD [facility] Pulmonary Critical Care Medicine Sleep Medicine

## 2022-07-16 DIAGNOSIS — D573 Sickle-cell trait: Secondary | ICD-10-CM | POA: Diagnosis not present

## 2022-07-16 DIAGNOSIS — J9621 Acute and chronic respiratory failure with hypoxia: Secondary | ICD-10-CM | POA: Diagnosis not present

## 2022-07-16 DIAGNOSIS — J151 Pneumonia due to Pseudomonas: Secondary | ICD-10-CM | POA: Diagnosis not present

## 2022-07-16 DIAGNOSIS — J90 Pleural effusion, not elsewhere classified: Secondary | ICD-10-CM | POA: Diagnosis not present

## 2022-07-16 LAB — SODIUM: Sodium: 141 mmol/L (ref 135–145)

## 2022-07-16 NOTE — Progress Notes (Signed)
Pulmonary [place]   PULMONARY CRITICAL CARE SERVICE  PROGRESS NOTE     Sheila Fernandez  [id number]:[id number]  DOB: 1951-01-06   DOA: 05/26/2022  Referring Physician: Satira Sark, MD  HPI: Sheila Fernandez is a 71 y.o. female being followed for ventilator/airway/oxygen weaning Acute on Chronic Respiratory Failure.  Currently on assist control mode has been on 28% FiO2 saturations are good  Medications: Reviewed on Rounds  Physical Exam:  Vitals: Temperature is 96.9 pulse of 74 respiratory rate 16 blood pressure is 116/62 saturations 100%  Ventilator Settings on assist control FiO2 is 28% tidal volume 400 PEEP 5  General: Comfortable at this time Neck: supple Cardiovascular: no malignant arrhythmias Respiratory: No rhonchi very coarse breath sounds Skin: no rash seen on limited exam Musculoskeletal: No gross abnormality Psychiatric:unable to assess Neurologic:no involuntary movements         Lab Data:   Basic Metabolic Panel: Recent Labs  Lab 07/10/22 0135 07/12/22 0128 07/15/22 0123 07/16/22 0132  NA 145 144 146* 141  K 4.0 3.8 4.4  --   CL 113* 113* 111  --   CO2 '26 26 28  '$ --   GLUCOSE 148* 131* 138*  --   BUN 26* 19 35*  --   CREATININE 0.55 0.52 0.76  --   CALCIUM 9.3 9.5 10.1  --     ABG: No results for input(s): "PHART", "PCO2ART", "PO2ART", "HCO3", "O2SAT" in the last 168 hours.  Liver Function Tests: No results for input(s): "AST", "ALT", "ALKPHOS", "BILITOT", "PROT", "ALBUMIN" in the last 168 hours. No results for input(s): "LIPASE", "AMYLASE" in the last 168 hours. No results for input(s): "AMMONIA" in the last 168 hours.  CBC: Recent Labs  Lab 07/10/22 0135 07/12/22 0128 07/15/22 0123  WBC 5.3 7.3 8.9  HGB 7.3* 7.7* 7.6*  HCT 23.5* 23.5* 24.5*  MCV 90.0 87.4 89.7  PLT 178 217 263    Cardiac Enzymes: No results for input(s): "CKTOTAL", "CKMB", "CKMBINDEX", "TROPONINI" in the last 168  hours.  BNP (last 3 results) Recent Labs    04/08/22 0336  BNP 51.6    ProBNP (last 3 results) No results for input(s): "PROBNP" in the last 8760 hours.  Radiological Exams: DG Chest Port 1 View  Result Date: 07/15/2022 CLINICAL DATA:  Chest pain EXAM: PORTABLE CHEST 1 VIEW COMPARISON:  07/04/2022 FINDINGS: Unchanged tracheostomy tube. Decreased airspace opacities over the right cardiophrenic angle and in the left lung base. No pleural effusion or pneumothorax. Unchanged cardiac and mediastinal contours. No acute osseous abnormality. IMPRESSION: Decreased bibasilar airspace opacities, which could represent atelectasis or infection. Electronically Signed   By: Merilyn Baba M.D.   On: 07/15/2022 14:20    Assessment/Plan Active Problems:   Sickle cell trait (HCC)   Acute on chronic respiratory failure with hypoxia (HCC)   Pneumonia of right lung due to Pseudomonas species (HCC)   Pleural effusion, right   Tracheostomy dependence (HCC)   Acute on chronic respiratory failure hypoxia we will continue with assist control mode patient is on 28% FiO2 saturations are good. Pneumonia due to Pseudomonas has been treated we will continue to follow along closely Pleural effusion supportive care Tracheostomy remains in place Sickle cell trait patient is at baseline   I have personally seen and evaluated the patient, evaluated laboratory and imaging results, formulated the assessment and plan and placed orders. The Patient requires high complexity decision making with multiple systems involvement.  Rounds were done with the  Respiratory Therapy Director and Staff therapists and discussed with nursing staff also.  Allyne Gee, MD [facility] Pulmonary Critical Care Medicine Sleep Medicine

## 2022-07-17 DIAGNOSIS — J90 Pleural effusion, not elsewhere classified: Secondary | ICD-10-CM | POA: Diagnosis not present

## 2022-07-17 DIAGNOSIS — J151 Pneumonia due to Pseudomonas: Secondary | ICD-10-CM | POA: Diagnosis not present

## 2022-07-17 DIAGNOSIS — D573 Sickle-cell trait: Secondary | ICD-10-CM | POA: Diagnosis not present

## 2022-07-17 DIAGNOSIS — J9621 Acute and chronic respiratory failure with hypoxia: Secondary | ICD-10-CM | POA: Diagnosis not present

## 2022-07-18 DIAGNOSIS — J90 Pleural effusion, not elsewhere classified: Secondary | ICD-10-CM | POA: Diagnosis not present

## 2022-07-18 DIAGNOSIS — J9621 Acute and chronic respiratory failure with hypoxia: Secondary | ICD-10-CM | POA: Diagnosis not present

## 2022-07-18 DIAGNOSIS — D573 Sickle-cell trait: Secondary | ICD-10-CM | POA: Diagnosis not present

## 2022-07-18 DIAGNOSIS — J151 Pneumonia due to Pseudomonas: Secondary | ICD-10-CM | POA: Diagnosis not present

## 2022-07-18 LAB — BASIC METABOLIC PANEL
Anion gap: 5 (ref 5–15)
BUN: 50 mg/dL — ABNORMAL HIGH (ref 8–23)
CO2: 31 mmol/L (ref 22–32)
Calcium: 9.8 mg/dL (ref 8.9–10.3)
Chloride: 108 mmol/L (ref 98–111)
Creatinine, Ser: 0.78 mg/dL (ref 0.44–1.00)
GFR, Estimated: >60 mL/min (ref >60)
Glucose, Bld: 138 mg/dL — ABNORMAL HIGH (ref 70–99)
Potassium: 4.4 mmol/L (ref 3.5–5.1)
Sodium: 144 mmol/L (ref 135–145)

## 2022-07-18 LAB — CBC
HCT: 22.8 % — ABNORMAL LOW (ref 36.0–46.0)
Hemoglobin: 7.3 g/dL — ABNORMAL LOW (ref 12.0–15.0)
MCH: 28.4 pg (ref 26.0–34.0)
MCHC: 32 g/dL (ref 30.0–36.0)
MCV: 88.7 fL (ref 80.0–100.0)
Platelets: 231 10*3/uL (ref 150–400)
RBC: 2.57 MIL/uL — ABNORMAL LOW (ref 3.87–5.11)
RDW: 15.6 % — ABNORMAL HIGH (ref 11.5–15.5)
WBC: 9.3 10*3/uL (ref 4.0–10.5)
nRBC: 0 % (ref 0.0–0.2)

## 2022-07-18 LAB — MAGNESIUM: Magnesium: 2.4 mg/dL (ref 1.7–2.4)

## 2022-07-18 NOTE — Progress Notes (Cosign Needed)
Pulmonary [place]   PULMONARY CRITICAL CARE SERVICE  PROGRESS NOTE     OWEN PAGNOTTA  [id number]:[id number]  DOB: 02/09/1951   DOA: 05/26/2022  Referring Physician: Satira Sark, MD  HPI: Sheila Fernandez is a 71 y.o. female being followed for ventilator/airway/oxygen weaning Acute on Chronic Respiratory Failure.  Patient seen lying in bed, currently remains on baseline of full support ventilation.  No overnight events noted.  Medications: Reviewed on Rounds  Physical Exam:  Vitals: Temp 97.3, pulse 62, respirations 22, BP 96/56, SPO2 98%  Ventilator Settings AC VC, FiO2 20%, tidal volume 400, rate 12, PEEP 5  General: Comfortable at this time Neck: supple Cardiovascular: no malignant arrhythmias Respiratory: Bilaterally diminished Skin: no rash seen on limited exam Musculoskeletal: No gross abnormality Psychiatric:unable to assess Neurologic:no involuntary movements         Lab Data:   Basic Metabolic Panel: Recent Labs  Lab 07/12/22 0128 07/15/22 0123 07/16/22 0132 07/18/22 0134  NA 144 146* 141 144  K 3.8 4.4  --  4.4  CL 113* 111  --  108  CO2 26 28  --  31  GLUCOSE 131* 138*  --  138*  BUN 19 35*  --  50*  CREATININE 0.52 0.76  --  0.78  CALCIUM 9.5 10.1  --  9.8  MG  --   --   --  2.4    ABG: No results for input(s): "PHART", "PCO2ART", "PO2ART", "HCO3", "O2SAT" in the last 168 hours.  Liver Function Tests: No results for input(s): "AST", "ALT", "ALKPHOS", "BILITOT", "PROT", "ALBUMIN" in the last 168 hours. No results for input(s): "LIPASE", "AMYLASE" in the last 168 hours. No results for input(s): "AMMONIA" in the last 168 hours.  CBC: Recent Labs  Lab 07/12/22 0128 07/15/22 0123 07/18/22 0134  WBC 7.3 8.9 9.3  HGB 7.7* 7.6* 7.3*  HCT 23.5* 24.5* 22.8*  MCV 87.4 89.7 88.7  PLT 217 263 231    Cardiac Enzymes: No results for input(s): "CKTOTAL", "CKMB", "CKMBINDEX", "TROPONINI" in the  last 168 hours.  BNP (last 3 results) Recent Labs    04/08/22 0336  BNP 51.6    ProBNP (last 3 results) No results for input(s): "PROBNP" in the last 8760 hours.  Radiological Exams: No results found.  Assessment/Plan Active Problems:   Sickle cell trait (HCC)   Acute on chronic respiratory failure with hypoxia (HCC)   Pneumonia of right lung due to Pseudomonas species (HCC)   Pleural effusion, right   Tracheostomy dependence (HCC)  Acute on chronic respiratory failure hypoxia patient remains on baseline of full support ventilation.  Continue with supportive care. Pseudomonas pneumonia-has been treated, we will continue to follow along closely. Pleural effusion-continue with supportive care. Tracheostomy-remains stable and in place.  Continue with trach care per protocol. Sickle cell trait-patient is at baseline.   I have personally seen and evaluated the patient, evaluated laboratory and imaging results, formulated the assessment and plan and placed orders. The Patient requires high complexity decision making with multiple systems involvement.  Rounds were done with the Respiratory Therapy Director and Staff therapists and discussed with nursing staff also.  Sheila Gee, MD [facility] Pulmonary Critical Care Medicine Sleep Medicine

## 2022-07-18 NOTE — Progress Notes (Cosign Needed)
Pulmonary [place]   PULMONARY CRITICAL CARE SERVICE  PROGRESS NOTE     Sheila Fernandez  [id number]  DOB: [birth year]   DOA: 05/26/2022  Referring Physician: Satira Sark, MD  HPI: Sheila Fernandez is a 71 y.o. female being followed for ventilator/airway/oxygen weaning Acute on Chronic Respiratory Failure.  Patient seen lying in bed, currently remains on baseline of full support ventilation.  Medications: Reviewed on Rounds  Physical Exam:  Vitals: Temp 97.5, pulse 64, respirations 15, BP 110/65, SPO2 100%  Ventilator Settings AC VC, FiO2 20%, tidal volume 400, rate 12, PEEP 5  General: Comfortable at this time Neck: supple Cardiovascular: no malignant arrhythmias Respiratory: Bilaterally diminished Skin: no rash seen on limited exam Musculoskeletal: No gross abnormality Psychiatric:unable to assess Neurologic:no involuntary movements         Lab Data:   Basic Metabolic Panel: Recent Labs  Lab 07/12/22 0128 07/15/22 0123 07/16/22 0132 07/18/22 0134  NA 144 146* 141 144  K 3.8 4.4  --  4.4  CL 113* 111  --  108  CO2 26 28  --  31  GLUCOSE 131* 138*  --  138*  BUN 19 35*  --  50*  CREATININE 0.52 0.76  --  0.78  CALCIUM 9.5 10.1  --  9.8  MG  --   --   --  2.4    ABG: No results for input(s): "PHART", "PCO2ART", "PO2ART", "HCO3", "O2SAT" in the last 168 hours.  Liver Function Tests: No results for input(s): "AST", "ALT", "ALKPHOS", "BILITOT", "PROT", "ALBUMIN" in the last 168 hours. No results for input(s): "LIPASE", "AMYLASE" in the last 168 hours. No results for input(s): "AMMONIA" in the last 168 hours.  CBC: Recent Labs  Lab 07/12/22 0128 07/15/22 0123 07/18/22 0134  WBC 7.3 8.9 9.3  HGB 7.7* 7.6* 7.3*  HCT 23.5* 24.5* 22.8*  MCV 87.4 89.7 88.7  PLT 217 263 231    Cardiac Enzymes: No results for input(s): "CKTOTAL", "CKMB", "CKMBINDEX", "TROPONINI" in the last 168 hours.  BNP  (last 3 results) Recent Labs    04/08/22 0336  BNP 51.6    ProBNP (last 3 results) No results for input(s): "PROBNP" in the last 8760 hours.  Radiological Exams: No results found.  Assessment/Plan Active Problems:   Sickle cell trait (HCC)   Acute on chronic respiratory failure with hypoxia (HCC)   Pneumonia of right lung due to Pseudomonas species (HCC)   Pleural effusion, right   Tracheostomy dependence (HCC)  Acute on chronic respiratory failure hypoxia patient remains on baseline of full support ventilation.  Continue with supportive care. Pseudomonas pneumonia-has been treated, we will continue to follow along closely. Pleural effusion-continue with supportive care. Tracheostomy-remains stable and in place.  Continue with trach care per protocol. Sickle cell trait-patient is at baseline.   I have personally seen and evaluated the patient, evaluated laboratory and imaging results, formulated the assessment and plan and placed orders. The Patient requires high complexity decision making with multiple systems involvement.  Rounds were done with the Respiratory Therapy Director and Staff therapists and discussed with nursing staff also.  Allyne Gee, MD [facility] Pulmonary Critical Care Medicine Sleep Medicine

## 2022-07-19 DIAGNOSIS — J90 Pleural effusion, not elsewhere classified: Secondary | ICD-10-CM | POA: Diagnosis not present

## 2022-07-19 DIAGNOSIS — J9621 Acute and chronic respiratory failure with hypoxia: Secondary | ICD-10-CM | POA: Diagnosis not present

## 2022-07-19 DIAGNOSIS — D573 Sickle-cell trait: Secondary | ICD-10-CM | POA: Diagnosis not present

## 2022-07-19 DIAGNOSIS — J151 Pneumonia due to Pseudomonas: Secondary | ICD-10-CM | POA: Diagnosis not present

## 2022-07-19 NOTE — Progress Notes (Cosign Needed)
Pulmonary [place]   PULMONARY CRITICAL CARE SERVICE  PROGRESS NOTE     Sheila Fernandez  [id number]  DOB: [birth year]   DOA: 05/26/2022  Referring Physician: Satira Sark, MD  HPI: Sheila Fernandez is a 71 y.o. female being followed for ventilator/airway/oxygen weaning Acute on Chronic Respiratory Failure.  Patient seen lying in bed, currently remains on baseline of full support ventilation.  Medications: Reviewed on Rounds  Physical Exam:  Vitals: Temp 97.2, pulse 71, respirations 19, BP 99/60, SPO2 100%  Ventilator Settings AC VC, FiO2 20%, tidal volume 400, rate 12, PEEP 5  General: Comfortable at this time Neck: supple Cardiovascular: no malignant arrhythmias Respiratory: Bilaterally diminished Skin: no rash seen on limited exam Musculoskeletal: No gross abnormality Psychiatric:unable to assess Neurologic:no involuntary movements         Lab Data:   Basic Metabolic Panel: Recent Labs  Lab 07/15/22 0123 07/16/22 0132 07/18/22 0134  NA 146* 141 144  K 4.4  --  4.4  CL 111  --  108  CO2 28  --  31  GLUCOSE 138*  --  138*  BUN 35*  --  50*  CREATININE 0.76  --  0.78  CALCIUM 10.1  --  9.8  MG  --   --  2.4    ABG: No results for input(s): "PHART", "PCO2ART", "PO2ART", "HCO3", "O2SAT" in the last 168 hours.  Liver Function Tests: No results for input(s): "AST", "ALT", "ALKPHOS", "BILITOT", "PROT", "ALBUMIN" in the last 168 hours. No results for input(s): "LIPASE", "AMYLASE" in the last 168 hours. No results for input(s): "AMMONIA" in the last 168 hours.  CBC: Recent Labs  Lab 07/15/22 0123 07/18/22 0134  WBC 8.9 9.3  HGB 7.6* 7.3*  HCT 24.5* 22.8*  MCV 89.7 88.7  PLT 263 231    Cardiac Enzymes: No results for input(s): "CKTOTAL", "CKMB", "CKMBINDEX", "TROPONINI" in the last 168 hours.  BNP (last 3 results) Recent Labs    04/08/22 0336  BNP 51.6    ProBNP (last 3 results) No  results for input(s): "PROBNP" in the last 8760 hours.  Radiological Exams: No results found.  Assessment/Plan Active Problems:   Sickle cell trait (HCC)   Acute on chronic respiratory failure with hypoxia (HCC)   Pneumonia of right lung due to Pseudomonas species (HCC)   Pleural effusion, right   Tracheostomy dependence (HCC)  Acute on chronic respiratory failure hypoxia patient remains on baseline of full support ventilation.  Continue with supportive care. Pseudomonas pneumonia-has been treated, we will continue to follow along closely. Pleural effusion-continue with supportive care. Tracheostomy-remains stable and in place.  Continue with trach care per protocol. Sickle cell trait-patient is at baseline.   I have personally seen and evaluated the patient, evaluated laboratory and imaging results, formulated the assessment and plan and placed orders. The Patient requires high complexity decision making with multiple systems involvement.  Rounds were done with the Respiratory Therapy Director and Staff therapists and discussed with nursing staff also.  Allyne Gee, MD [facility] Pulmonary Critical Care Medicine Sleep Medicine

## 2022-07-20 NOTE — Progress Notes (Cosign Needed)
Pulmonary [place]   PULMONARY CRITICAL CARE SERVICE  PROGRESS NOTE     Sheila Fernandez  [id number]  DOB: 10-24-1950   DOA: 05/26/2022  Referring Physician: Satira Sark, MD  HPI: Sheila Fernandez is a 71 y.o. female being followed for ventilator/airway/oxygen weaning Acute on Chronic Respiratory Failure.  Patient seen lying in bed, remains on baseline of full support ventilation.  Medications: Reviewed on Rounds  Physical Exam:  Vitals: Temp 97.6, pulse 80, respirations 24, BP 133/77, SPO2 95%  Ventilator Settings AC VC plus, FiO2 28%, tidal volume 400, rate 12, PEEP 5  General: Comfortable at this time Neck: supple Cardiovascular: no malignant arrhythmias Respiratory: Bilaterally diminished Skin: no rash seen on limited exam Musculoskeletal: No gross abnormality Psychiatric:unable to assess Neurologic:no involuntary movements         Lab Data:   Basic Metabolic Panel: Recent Labs  Lab 07/15/22 0123 07/16/22 0132 07/18/22 0134  NA 146* 141 144  K 4.4  --  4.4  CL 111  --  108  CO2 28  --  31  GLUCOSE 138*  --  138*  BUN 35*  --  50*  CREATININE 0.76  --  0.78  CALCIUM 10.1  --  9.8  MG  --   --  2.4    ABG: No results for input(s): "PHART", "PCO2ART", "PO2ART", "HCO3", "O2SAT" in the last 168 hours.  Liver Function Tests: No results for input(s): "AST", "ALT", "ALKPHOS", "BILITOT", "PROT", "ALBUMIN" in the last 168 hours. No results for input(s): "LIPASE", "AMYLASE" in the last 168 hours. No results for input(s): "AMMONIA" in the last 168 hours.  CBC: Recent Labs  Lab 07/15/22 0123 07/18/22 0134  WBC 8.9 9.3  HGB 7.6* 7.3*  HCT 24.5* 22.8*  MCV 89.7 88.7  PLT 263 231    Cardiac Enzymes: No results for input(s): "CKTOTAL", "CKMB", "CKMBINDEX", "TROPONINI" in the last 168 hours.  BNP (last 3 results) Recent Labs    04/08/22 0336  BNP 51.6    ProBNP (last 3 results) No results  for input(s): "PROBNP" in the last 8760 hours.  Radiological Exams: No results found.  Assessment/Plan Active Problems:   Sickle cell trait (HCC)   Acute on chronic respiratory failure with hypoxia (HCC)   Pneumonia of right lung due to Pseudomonas species (HCC)   Pleural effusion, right   Tracheostomy dependence (HCC)  Acute on chronic respiratory failure hypoxia patient remains on baseline of full support ventilation.  Continue with supportive care. Pseudomonas pneumonia-has been treated, we will continue to follow along closely. Pleural effusion-continue with supportive care. Tracheostomy-remains stable and in place.  Continue with trach care per protocol. Sickle cell trait-patient is at baseline.   I have personally seen and evaluated the patient, evaluated laboratory and imaging results, formulated the assessment and plan and placed orders. The Patient requires high complexity decision making with multiple systems involvement.  Rounds were done with the Respiratory Therapy Director and Staff therapists and discussed with nursing staff also.  Allyne Gee, MD [facility] Pulmonary Critical Care Medicine Sleep Medicine

## 2022-07-21 LAB — CBC
HCT: 24.1 % — ABNORMAL LOW (ref 36.0–46.0)
Hemoglobin: 7.7 g/dL — ABNORMAL LOW (ref 12.0–15.0)
MCH: 28.5 pg (ref 26.0–34.0)
MCHC: 32 g/dL (ref 30.0–36.0)
MCV: 89.3 fL (ref 80.0–100.0)
Platelets: 216 10*3/uL (ref 150–400)
RBC: 2.7 MIL/uL — ABNORMAL LOW (ref 3.87–5.11)
RDW: 15.8 % — ABNORMAL HIGH (ref 11.5–15.5)
WBC: 5.9 10*3/uL (ref 4.0–10.5)
nRBC: 0 % (ref 0.0–0.2)

## 2022-07-21 LAB — BASIC METABOLIC PANEL
Anion gap: 8 (ref 5–15)
BUN: 51 mg/dL — ABNORMAL HIGH (ref 8–23)
CO2: 28 mmol/L (ref 22–32)
Calcium: 9.9 mg/dL (ref 8.9–10.3)
Chloride: 107 mmol/L (ref 98–111)
Creatinine, Ser: 0.84 mg/dL (ref 0.44–1.00)
GFR, Estimated: >60 mL/min (ref >60)
Glucose, Bld: 146 mg/dL — ABNORMAL HIGH (ref 70–99)
Potassium: 4.6 mmol/L (ref 3.5–5.1)
Sodium: 143 mmol/L (ref 135–145)

## 2022-07-21 LAB — MAGNESIUM: Magnesium: 2.4 mg/dL (ref 1.7–2.4)

## 2022-07-21 NOTE — Progress Notes (Addendum)
Pulmonary [place]   PULMONARY CRITICAL CARE SERVICE  PROGRESS NOTE     Sheila Fernandez  [id number]  DOB: 09/26/1951   DOA: 05/26/2022  Referring Physician: Satira Sark, MD  HPI: Sheila Fernandez is a 71 y.o. female being followed for ventilator/airway/oxygen weaning Acute on Chronic Respiratory Failure.  Patient seen lying in bed, currently remains on baseline of full support ventilation.  Medications: Reviewed on Rounds  Physical Exam:  Vitals: Temp 97.2, pulse 71, respirations 32, BP 120/73, SPO2 99%  Ventilator Settings AC VC plus, FiO2 20%, tidal volume 400, rate 12, PEEP 5  General: Comfortable at this time Neck: supple Cardiovascular: no malignant arrhythmias Respiratory: Bilaterally diminished Skin: no rash seen on limited exam Musculoskeletal: No gross abnormality Psychiatric:unable to assess Neurologic:no involuntary movements         Lab Data:   Basic Metabolic Panel: Recent Labs  Lab 07/15/22 0123 07/16/22 0132 07/18/22 0134 07/21/22 0147  NA 146* 141 144 143  K 4.4  --  4.4 4.6  CL 111  --  108 107  CO2 28  --  31 28  GLUCOSE 138*  --  138* 146*  BUN 35*  --  50* 51*  CREATININE 0.76  --  0.78 0.84  CALCIUM 10.1  --  9.8 9.9  MG  --   --  2.4 2.4    ABG: No results for input(s): "PHART", "PCO2ART", "PO2ART", "HCO3", "O2SAT" in the last 168 hours.  Liver Function Tests: No results for input(s): "AST", "ALT", "ALKPHOS", "BILITOT", "PROT", "ALBUMIN" in the last 168 hours. No results for input(s): "LIPASE", "AMYLASE" in the last 168 hours. No results for input(s): "AMMONIA" in the last 168 hours.  CBC: Recent Labs  Lab 07/15/22 0123 07/18/22 0134 07/21/22 0147  WBC 8.9 9.3 5.9  HGB 7.6* 7.3* 7.7*  HCT 24.5* 22.8* 24.1*  MCV 89.7 88.7 89.3  PLT 263 231 216    Cardiac Enzymes: No results for input(s): "CKTOTAL", "CKMB", "CKMBINDEX", "TROPONINI" in the last 168 hours.  BNP  (last 3 results) Recent Labs    04/08/22 0336  BNP 51.6    ProBNP (last 3 results) No results for input(s): "PROBNP" in the last 8760 hours.  Radiological Exams: No results found.  Assessment/Plan Active Problems:   Sickle cell trait (HCC)   Acute on chronic respiratory failure with hypoxia (HCC)   Pneumonia of right lung due to Pseudomonas species (HCC)   Pleural effusion, right   Tracheostomy dependence (HCC)   Acute on chronic respiratory failure hypoxia patient remains on baseline of full support ventilation.  Continue with supportive care. Pseudomonas pneumonia-has been treated, we will continue to follow along closely. Pleural effusion-continue with supportive care. Tracheostomy-remains stable and in place.  Continue with trach care per protocol. Sickle cell trait-patient is at baseline.   I have personally seen and evaluated the patient, evaluated laboratory and imaging results, formulated the assessment and plan and placed orders. The Patient requires high complexity decision making with multiple systems involvement.  Rounds were done with the Respiratory Therapy Director and Staff therapists and discussed with nursing staff also.  Allyne Gee, MD [facility] Pulmonary Critical Care Medicine Sleep Medicine

## 2022-07-23 NOTE — Progress Notes (Cosign Needed)
Pulmonary [place]   PULMONARY CRITICAL CARE SERVICE  PROGRESS NOTE     Sheila Fernandez  EVO:[id number]  DOB: [birth year]   DOA: 05/26/2022  Referring Physician: Satira Sark, MD  HPI: Sheila Fernandez is a 71 y.o. female being followed for ventilator/airway/oxygen weaning Acute on Chronic Respiratory Failure.  Patient seen lying in bed, currently remains on baseline of full support ventilation.  No acute overnight events noted.  Medications: Reviewed on Rounds  Physical Exam:  Vitals: Temp 96.7, pulse 64, respirations 14, BP 106/67, SPO2 100%  Ventilator Settings AC VC plus, FiO2 28%, tidal volume 400, rate 12, PEEP 5  General: Comfortable at this time Neck: supple Cardiovascular: no malignant arrhythmias Respiratory: Bilaterally diminished Skin: no rash seen on limited exam Musculoskeletal: No gross abnormality Psychiatric:unable to assess Neurologic:no involuntary movements         Lab Data:   Basic Metabolic Panel: Recent Labs  Lab 07/18/22 0134 07/21/22 0147  NA 144 143  K 4.4 4.6  CL 108 107  CO2 31 28  GLUCOSE 138* 146*  BUN 50* 51*  CREATININE 0.78 0.84  CALCIUM 9.8 9.9  MG 2.4 2.4    ABG: No results for input(s): "PHART", "PCO2ART", "PO2ART", "HCO3", "O2SAT" in the last 168 hours.  Liver Function Tests: No results for input(s): "AST", "ALT", "ALKPHOS", "BILITOT", "PROT", "ALBUMIN" in the last 168 hours. No results for input(s): "LIPASE", "AMYLASE" in the last 168 hours. No results for input(s): "AMMONIA" in the last 168 hours.  CBC: Recent Labs  Lab 07/18/22 0134 07/21/22 0147  WBC 9.3 5.9  HGB 7.3* 7.7*  HCT 22.8* 24.1*  MCV 88.7 89.3  PLT 231 216    Cardiac Enzymes: No results for input(s): "CKTOTAL", "CKMB", "CKMBINDEX", "TROPONINI" in the last 168 hours.  BNP (last 3 results) Recent Labs    04/08/22 0336  BNP 51.6    ProBNP (last 3 results) No results for input(s):  "PROBNP" in the last 8760 hours.  Radiological Exams: No results found.  Assessment/Plan Active Problems:   Sickle cell trait (HCC)   Acute on chronic respiratory failure with hypoxia (HCC)   Pneumonia of right lung due to Pseudomonas species (HCC)   Pleural effusion, right   Tracheostomy dependence (HCC)  Acute on chronic respiratory failure hypoxia patient remains on baseline of full support ventilation.  Continue with supportive care. Pseudomonas pneumonia-has been treated, we will continue to follow along closely. Pleural effusion-continue with supportive care. Tracheostomy-remains stable and in place.  Continue with trach care per protocol. Sickle cell trait-patient is at baseline.   I have personally seen and evaluated the patient, evaluated laboratory and imaging results, formulated the assessment and plan and placed orders. The Patient requires high complexity decision making with multiple systems involvement.  Rounds were done with the Respiratory Therapy Director and Staff therapists and discussed with nursing staff also.  Allyne Gee, MD [facility] Pulmonary Critical Care Medicine Sleep Medicine

## 2022-07-23 NOTE — Progress Notes (Cosign Needed)
Pulmonary [place]   PULMONARY CRITICAL CARE SERVICE  PROGRESS NOTE     Sheila Fernandez  SFK:[id number]  DOB: 28-Apr-1951   DOA: 05/26/2022  Referring Physician: Satira Sark, MD  HPI: Sheila Fernandez is a 71 y.o. female being followed for ventilator/airway/oxygen weaning Acute on Chronic Respiratory Failure.  Patient seen lying in bed, currently remains on baseline of full support ventilation.  Medications: Reviewed on Rounds  Physical Exam:  Vitals: Temp 97.0, pulse 57, respirations 13, BP 100/68, SPO2 100%  Ventilator Settings AC VC plus, FiO2 20%, tidal volume 400, rate 12, PEEP 5  General: Comfortable at this time Neck: supple Cardiovascular: no malignant arrhythmias Respiratory: Bilaterally diminished Skin: no rash seen on limited exam Musculoskeletal: No gross abnormality Psychiatric:unable to assess Neurologic:no involuntary movements         Lab Data:   Basic Metabolic Panel: Recent Labs  Lab 07/18/22 0134 07/21/22 0147  NA 144 143  K 4.4 4.6  CL 108 107  CO2 31 28  GLUCOSE 138* 146*  BUN 50* 51*  CREATININE 0.78 0.84  CALCIUM 9.8 9.9  MG 2.4 2.4    ABG: No results for input(s): "PHART", "PCO2ART", "PO2ART", "HCO3", "O2SAT" in the last 168 hours.  Liver Function Tests: No results for input(s): "AST", "ALT", "ALKPHOS", "BILITOT", "PROT", "ALBUMIN" in the last 168 hours. No results for input(s): "LIPASE", "AMYLASE" in the last 168 hours. No results for input(s): "AMMONIA" in the last 168 hours.  CBC: Recent Labs  Lab 07/18/22 0134 07/21/22 0147  WBC 9.3 5.9  HGB 7.3* 7.7*  HCT 22.8* 24.1*  MCV 88.7 89.3  PLT 231 216    Cardiac Enzymes: No results for input(s): "CKTOTAL", "CKMB", "CKMBINDEX", "TROPONINI" in the last 168 hours.  BNP (last 3 results) Recent Labs    04/08/22 0336  BNP 51.6    ProBNP (last 3 results) No results for input(s): "PROBNP" in the last 8760  hours.  Radiological Exams: No results found.  Assessment/Plan Active Problems:   Sickle cell trait (HCC)   Acute on chronic respiratory failure with hypoxia (HCC)   Pneumonia of right lung due to Pseudomonas species (HCC)   Pleural effusion, right   Tracheostomy dependence (HCC)  Acute on chronic respiratory failure hypoxia patient remains on baseline of full support ventilation.  Continue with supportive care. Pseudomonas pneumonia-has been treated, we will continue to follow along closely. Pleural effusion-continue with supportive care. Tracheostomy-remains stable and in place.  Continue with trach care per protocol. Sickle cell trait-patient is at baseline.  I have personally seen and evaluated the patient, evaluated laboratory and imaging results, formulated the assessment and plan and placed orders. The Patient requires high complexity decision making with multiple systems involvement.  Rounds were done with the Respiratory Therapy Director and Staff therapists and discussed with nursing staff also.  Allyne Gee, MD [facility] Pulmonary Critical Care Medicine Sleep Medicine

## 2022-07-24 LAB — BASIC METABOLIC PANEL
Anion gap: 5 (ref 5–15)
BUN: 59 mg/dL — ABNORMAL HIGH (ref 8–23)
CO2: 29 mmol/L (ref 22–32)
Calcium: 9.6 mg/dL (ref 8.9–10.3)
Chloride: 108 mmol/L (ref 98–111)
Creatinine, Ser: 0.97 mg/dL (ref 0.44–1.00)
GFR, Estimated: >60 mL/min (ref >60)
Glucose, Bld: 131 mg/dL — ABNORMAL HIGH (ref 70–99)
Potassium: 4.1 mmol/L (ref 3.5–5.1)
Sodium: 142 mmol/L (ref 135–145)

## 2022-07-24 LAB — CBC
HCT: 25.3 % — ABNORMAL LOW (ref 36.0–46.0)
Hemoglobin: 8 g/dL — ABNORMAL LOW (ref 12.0–15.0)
MCH: 28.4 pg (ref 26.0–34.0)
MCHC: 31.6 g/dL (ref 30.0–36.0)
MCV: 89.7 fL (ref 80.0–100.0)
Platelets: 192 10*3/uL (ref 150–400)
RBC: 2.82 MIL/uL — ABNORMAL LOW (ref 3.87–5.11)
RDW: 15.7 % — ABNORMAL HIGH (ref 11.5–15.5)
WBC: 5.7 10*3/uL (ref 4.0–10.5)
nRBC: 0 % (ref 0.0–0.2)

## 2022-07-24 LAB — MAGNESIUM: Magnesium: 2.5 mg/dL — ABNORMAL HIGH (ref 1.7–2.4)

## 2022-07-24 NOTE — Progress Notes (Signed)
Pulmonary [place]   PULMONARY CRITICAL CARE SERVICE  PROGRESS NOTE     Sheila Fernandez  OYD:[id number]  DOB: [birth year]   DOA: 05/26/2022  Referring Physician: Satira Sark, MD  HPI: Sheila Fernandez is a 71 y.o. female being followed for ventilator/airway/oxygen weaning Acute on Chronic Respiratory Failure.  Patient on assist control mode on 28% FiO2  Medications: Reviewed on Rounds  Physical Exam:  Vitals: Temperature is 96.9 pulse 69 respiratory rate 15 blood pressure is 122/95 saturations 100%  Ventilator Settings on assist control FiO2 is 28% tidal volume 400 PEEP 5  General: Comfortable at this time Neck: supple Cardiovascular: no malignant arrhythmias Respiratory: Scattered rhonchi expansion is equal Skin: no rash seen on limited exam Musculoskeletal: No gross abnormality Psychiatric:unable to assess Neurologic:no involuntary movements         Lab Data:   Basic Metabolic Panel: Recent Labs  Lab 07/18/22 0134 07/21/22 0147 07/24/22 0220  NA 144 143 142  K 4.4 4.6 4.1  CL 108 107 108  CO2 '31 28 29  '$ GLUCOSE 138* 146* 131*  BUN 50* 51* 59*  CREATININE 0.78 0.84 0.97  CALCIUM 9.8 9.9 9.6  MG 2.4 2.4 2.5*    ABG: No results for input(s): "PHART", "PCO2ART", "PO2ART", "HCO3", "O2SAT" in the last 168 hours.  Liver Function Tests: No results for input(s): "AST", "ALT", "ALKPHOS", "BILITOT", "PROT", "ALBUMIN" in the last 168 hours. No results for input(s): "LIPASE", "AMYLASE" in the last 168 hours. No results for input(s): "AMMONIA" in the last 168 hours.  CBC: Recent Labs  Lab 07/18/22 0134 07/21/22 0147 07/24/22 0220  WBC 9.3 5.9 5.7  HGB 7.3* 7.7* 8.0*  HCT 22.8* 24.1* 25.3*  MCV 88.7 89.3 89.7  PLT 231 216 192    Cardiac Enzymes: No results for input(s): "CKTOTAL", "CKMB", "CKMBINDEX", "TROPONINI" in the last 168 hours.  BNP (last 3 results) Recent Labs    04/08/22 0336  BNP 51.6     ProBNP (last 3 results) No results for input(s): "PROBNP" in the last 8760 hours.  Radiological Exams: No results found.  Assessment/Plan Active Problems:   Sickle cell trait (HCC)   Acute on chronic respiratory failure with hypoxia (HCC)   Pneumonia of right lung due to Pseudomonas species (HCC)   Pleural effusion, right   Tracheostomy dependence (HCC)   Acute on chronic respiratory failure with hypoxia on 28% FiO2 assist control mode Sickle cell trait no change we will continue to follow along Pseudomonas pneumonia has been treated with antibiotics Pleural effusion supportive care Trach dependent no change   I have personally seen and evaluated the patient, evaluated laboratory and imaging results, formulated the assessment and plan and placed orders. The Patient requires high complexity decision making with multiple systems involvement.  Rounds were done with the Respiratory Therapy Director and Staff therapists and discussed with nursing staff also.  Sheila Gee, MD [facility] Pulmonary Critical Care Medicine Sleep Medicine

## 2022-07-25 NOTE — Progress Notes (Signed)
Pulmonary [place]   PULMONARY CRITICAL CARE SERVICE  PROGRESS NOTE     Sheila Fernandez  MEQ:[id number]  DOB: [birth year]   DOA: 05/26/2022  Referring Physician: Satira Sark, MD  HPI: Sheila Fernandez is a 71 y.o. female being followed for ventilator/airway/oxygen weaning Acute on Chronic Respiratory Failure.  Patient is on assist control mode currently on 28% FiO2 saturations are good  Medications: Reviewed on Rounds  Physical Exam:  Vitals: Temperature is 96.7 pulse 57 respiratory is 19 blood pressure 151/86 saturations 100%  Ventilator Settings on assist control FiO2 is 28% tidal line 400 PEEP of 5  General: Comfortable at this time Neck: supple Cardiovascular: no malignant arrhythmias Respiratory: No rhonchi no rales are noted at this time Skin: no rash seen on limited exam Musculoskeletal: No gross abnormality Psychiatric:unable to assess Neurologic:no involuntary movements         Lab Data:   Basic Metabolic Panel: Recent Labs  Lab 07/21/22 0147 07/24/22 0220  NA 143 142  K 4.6 4.1  CL 107 108  CO2 28 29  GLUCOSE 146* 131*  BUN 51* 59*  CREATININE 0.84 0.97  CALCIUM 9.9 9.6  MG 2.4 2.5*    ABG: No results for input(s): "PHART", "PCO2ART", "PO2ART", "HCO3", "O2SAT" in the last 168 hours.  Liver Function Tests: No results for input(s): "AST", "ALT", "ALKPHOS", "BILITOT", "PROT", "ALBUMIN" in the last 168 hours. No results for input(s): "LIPASE", "AMYLASE" in the last 168 hours. No results for input(s): "AMMONIA" in the last 168 hours.  CBC: Recent Labs  Lab 07/21/22 0147 07/24/22 0220  WBC 5.9 5.7  HGB 7.7* 8.0*  HCT 24.1* 25.3*  MCV 89.3 89.7  PLT 216 192    Cardiac Enzymes: No results for input(s): "CKTOTAL", "CKMB", "CKMBINDEX", "TROPONINI" in the last 168 hours.  BNP (last 3 results) Recent Labs    04/08/22 0336  BNP 51.6    ProBNP (last 3 results) No results for  input(s): "PROBNP" in the last 8760 hours.  Radiological Exams: No results found.  Assessment/Plan Active Problems:   Sickle cell trait (HCC)   Acute on chronic respiratory failure with hypoxia (HCC)   Pneumonia of right lung due to Pseudomonas species (HCC)   Pleural effusion, right   Tracheostomy dependence (HCC)   Acute on chronic respiratory failure hypoxia plan is to continue with full support on assist control currently on 28% FiO2 good saturations Sickle cell trait no change supportive care Pneumonia treated clinically improved Trach dependence supportive care we will continue to monitor Pleural effusion following up on x-rays as needed   I have personally seen and evaluated the patient, evaluated laboratory and imaging results, formulated the assessment and plan and placed orders. The Patient requires high complexity decision making with multiple systems involvement.  Rounds were done with the Respiratory Therapy Director and Staff therapists and discussed with nursing staff also.  Allyne Gee, MD [facility] Pulmonary Critical Care Medicine Sleep Medicine

## 2022-07-26 ENCOUNTER — Other Ambulatory Visit (HOSPITAL_COMMUNITY): Payer: Self-pay

## 2022-07-27 LAB — BASIC METABOLIC PANEL
Anion gap: 6 (ref 5–15)
BUN: 67 mg/dL — ABNORMAL HIGH (ref 8–23)
CO2: 31 mmol/L (ref 22–32)
Calcium: 10.3 mg/dL (ref 8.9–10.3)
Chloride: 116 mmol/L — ABNORMAL HIGH (ref 98–111)
Creatinine, Ser: 1.14 mg/dL — ABNORMAL HIGH (ref 0.44–1.00)
GFR, Estimated: 51 mL/min — ABNORMAL LOW (ref >60)
Glucose, Bld: 136 mg/dL — ABNORMAL HIGH (ref 70–99)
Potassium: 3.8 mmol/L (ref 3.5–5.1)
Sodium: 153 mmol/L — ABNORMAL HIGH (ref 135–145)

## 2022-07-27 LAB — CBC
HCT: 28.4 % — ABNORMAL LOW (ref 36.0–46.0)
Hemoglobin: 8.8 g/dL — ABNORMAL LOW (ref 12.0–15.0)
MCH: 28.5 pg (ref 26.0–34.0)
MCHC: 31 g/dL (ref 30.0–36.0)
MCV: 91.9 fL (ref 80.0–100.0)
Platelets: 178 10*3/uL (ref 150–400)
RBC: 3.09 MIL/uL — ABNORMAL LOW (ref 3.87–5.11)
RDW: 15.9 % — ABNORMAL HIGH (ref 11.5–15.5)
WBC: 7.5 10*3/uL (ref 4.0–10.5)
nRBC: 0 % (ref 0.0–0.2)

## 2022-07-27 NOTE — Progress Notes (Signed)
Pulmonary [place]   PULMONARY CRITICAL CARE SERVICE  PROGRESS NOTE     Sheila Fernandez  JOA:[id number]  DOB: 16-Dec-1950   DOA: 05/26/2022  Referring Physician: Satira Sark, MD  HPI: Sheila Fernandez is a 71 y.o. female being followed for ventilator/airway/oxygen weaning Acute on Chronic Respiratory Failure.  Patient is on assist control mode appears to be comfortable without distress currently on 28% FiO2  Medications: Reviewed on Rounds  Physical Exam:  Vitals: Temperature is 97.0 pulse 69 respiratory is 18 blood pressure is 113/66 saturations 100%  Ventilator Settings assist-control FiO2 28% tidal volume 400 PEEP 5  General: Comfortable at this time Neck: supple Cardiovascular: no malignant arrhythmias Respiratory: Scattered rhonchi expansion is equal Skin: no rash seen on limited exam Musculoskeletal: No gross abnormality Psychiatric:unable to assess Neurologic:no involuntary movements         Lab Data:   Basic Metabolic Panel: Recent Labs  Lab 07/21/22 0147 07/24/22 0220 07/27/22 0111  NA 143 142 153*  K 4.6 4.1 3.8  CL 107 108 116*  CO2 '28 29 31  '$ GLUCOSE 146* 131* 136*  BUN 51* 59* 67*  CREATININE 0.84 0.97 1.14*  CALCIUM 9.9 9.6 10.3  MG 2.4 2.5*  --     ABG: No results for input(s): "PHART", "PCO2ART", "PO2ART", "HCO3", "O2SAT" in the last 168 hours.  Liver Function Tests: No results for input(s): "AST", "ALT", "ALKPHOS", "BILITOT", "PROT", "ALBUMIN" in the last 168 hours. No results for input(s): "LIPASE", "AMYLASE" in the last 168 hours. No results for input(s): "AMMONIA" in the last 168 hours.  CBC: Recent Labs  Lab 07/21/22 0147 07/24/22 0220 07/27/22 0111  WBC 5.9 5.7 7.5  HGB 7.7* 8.0* 8.8*  HCT 24.1* 25.3* 28.4*  MCV 89.3 89.7 91.9  PLT 216 192 178    Cardiac Enzymes: No results for input(s): "CKTOTAL", "CKMB", "CKMBINDEX", "TROPONINI" in the last 168 hours.  BNP (last  3 results) Recent Labs    04/08/22 0336  BNP 51.6    ProBNP (last 3 results) No results for input(s): "PROBNP" in the last 8760 hours.  Radiological Exams: DG Abd 1 View  Result Date: 07/26/2022 CLINICAL DATA:  Constipation EXAM: ABDOMEN - 1 VIEW COMPARISON:  04/08/2022 FINDINGS: There is interval placement of percutaneous gastrostomy tube. Bowel gas pattern is nonspecific. Small to moderate amount of stool is seen in right colon. There is no fecal impaction in rectosigmoid. Surgical clips are seen in right upper quadrant. Dextroscoliosis is seen in lumbar spine. Degenerative changes are noted in lower lumbar spine. IMPRESSION: Nonspecific bowel gas pattern. Small to moderate amount of stool is seen in right colon without signs of fecal impaction in rectosigmoid. Electronically Signed   By: Elmer Picker M.D.   On: 07/26/2022 14:08    Assessment/Plan Active Problems:   Sickle cell trait (HCC)   Acute on chronic respiratory failure with hypoxia (HCC)   Pneumonia of right lung due to Pseudomonas species (HCC)   Pleural effusion, right   Tracheostomy dependence (HCC)   Acute on chronic respiratory failure hypoxia we will continue with full support on the ventilator. Sickle cell trait no change we will continue to follow Pneumonia supportive care prognosis guarded Pleural effusion x-ray as needed Tracheostomy remains in place   I have personally seen and evaluated the patient, evaluated laboratory and imaging results, formulated the assessment and plan and placed orders. The Patient requires high complexity decision making with multiple systems involvement.  Rounds were done  with the Respiratory Therapy Director and Staff therapists and discussed with nursing staff also.  Allyne Gee, MD [facility] Pulmonary Critical Care Medicine Sleep Medicine

## 2022-07-28 LAB — BASIC METABOLIC PANEL
Anion gap: 12 (ref 5–15)
BUN: 73 mg/dL — ABNORMAL HIGH (ref 8–23)
CO2: 29 mmol/L (ref 22–32)
Calcium: 10.1 mg/dL (ref 8.9–10.3)
Chloride: 112 mmol/L — ABNORMAL HIGH (ref 98–111)
Creatinine, Ser: 1.15 mg/dL — ABNORMAL HIGH (ref 0.44–1.00)
GFR, Estimated: 51 mL/min — ABNORMAL LOW (ref >60)
Glucose, Bld: 130 mg/dL — ABNORMAL HIGH (ref 70–99)
Potassium: 4.2 mmol/L (ref 3.5–5.1)
Sodium: 153 mmol/L — ABNORMAL HIGH (ref 135–145)

## 2022-07-28 NOTE — Progress Notes (Signed)
Pulmonary [place]   PULMONARY CRITICAL CARE SERVICE  PROGRESS NOTE     Sheila Fernandez  EPP:[id number]  DOB: [birth year]   DOA: 05/26/2022  Referring Physician: Satira Sark, MD  HPI: Sheila Fernandez is a 71 y.o. female being followed for ventilator/airway/oxygen weaning Acute on Chronic Respiratory Failure.  Patient is on assist control mode currently on 28% FiO2 saturations are good  Medications: Reviewed on Rounds  Physical Exam:  Vitals: Temperature is 97.3 pulse 70 respiratory is 14 blood pressure is 116/74 saturations 100%  Ventilator Settings assist-control FiO2 28% tidal volume 400 PEEP 5  General: Comfortable at this time Neck: supple Cardiovascular: no malignant arrhythmias Respiratory: No rhonchi no rales are noted Skin: no rash seen on limited exam Musculoskeletal: No gross abnormality Psychiatric:unable to assess Neurologic:no involuntary movements         Lab Data:   Basic Metabolic Panel: Recent Labs  Lab 07/24/22 0220 07/27/22 0111 07/28/22 0129  NA 142 153* 153*  K 4.1 3.8 4.2  CL 108 116* 112*  CO2 '29 31 29  '$ GLUCOSE 131* 136* 130*  BUN 59* 67* 73*  CREATININE 0.97 1.14* 1.15*  CALCIUM 9.6 10.3 10.1  MG 2.5*  --   --     ABG: No results for input(s): "PHART", "PCO2ART", "PO2ART", "HCO3", "O2SAT" in the last 168 hours.  Liver Function Tests: No results for input(s): "AST", "ALT", "ALKPHOS", "BILITOT", "PROT", "ALBUMIN" in the last 168 hours. No results for input(s): "LIPASE", "AMYLASE" in the last 168 hours. No results for input(s): "AMMONIA" in the last 168 hours.  CBC: Recent Labs  Lab 07/24/22 0220 07/27/22 0111  WBC 5.7 7.5  HGB 8.0* 8.8*  HCT 25.3* 28.4*  MCV 89.7 91.9  PLT 192 178    Cardiac Enzymes: No results for input(s): "CKTOTAL", "CKMB", "CKMBINDEX", "TROPONINI" in the last 168 hours.  BNP (last 3 results) Recent Labs    04/08/22 0336  BNP 51.6     ProBNP (last 3 results) No results for input(s): "PROBNP" in the last 8760 hours.  Radiological Exams: DG Abd 1 View  Result Date: 07/26/2022 CLINICAL DATA:  Constipation EXAM: ABDOMEN - 1 VIEW COMPARISON:  04/08/2022 FINDINGS: There is interval placement of percutaneous gastrostomy tube. Bowel gas pattern is nonspecific. Small to moderate amount of stool is seen in right colon. There is no fecal impaction in rectosigmoid. Surgical clips are seen in right upper quadrant. Dextroscoliosis is seen in lumbar spine. Degenerative changes are noted in lower lumbar spine. IMPRESSION: Nonspecific bowel gas pattern. Small to moderate amount of stool is seen in right colon without signs of fecal impaction in rectosigmoid. Electronically Signed   By: Elmer Picker M.D.   On: 07/26/2022 14:08    Assessment/Plan Active Problems:   Sickle cell trait (HCC)   Acute on chronic respiratory failure with hypoxia (HCC)   Pneumonia of right lung due to Pseudomonas species (HCC)   Pleural effusion, right   Tracheostomy dependence (HCC)   Acute on chronic respiratory failure hypoxia we will continue at baseline on assist control mode Sickle cell trait no change continue with supportive care Pneumonia treated Pleural effusion monitoring x-rays Tracheostomy remains in place   I have personally seen and evaluated the patient, evaluated laboratory and imaging results, formulated the assessment and plan and placed orders. The Patient requires high complexity decision making with multiple systems involvement.  Rounds were done with the Respiratory Therapy Director and Staff therapists and discussed with nursing  staff also.  Allyne Gee, MD [facility] Pulmonary Critical Care Medicine Sleep Medicine

## 2022-07-29 NOTE — Progress Notes (Signed)
Pulmonary [place]   PULMONARY CRITICAL CARE SERVICE  PROGRESS NOTE     Sheila Fernandez  EXB:[id number]  DOB: January 04, 1951   DOA: 05/26/2022  Referring Physician: Satira Sark, MD  HPI: Sheila Fernandez is a 71 y.o. female being followed for ventilator/airway/oxygen weaning Acute on Chronic Respiratory Failure.  Patient is on the ventilator at baseline no changes are noted  Medications: Reviewed on Rounds  Physical Exam:  Vitals: Temperature is 97.6 pulse 69 respiratory 17 blood pressure 100/59 saturations 100%  Ventilator Settings assist-control FiO2 is 28% tidal volume 450 PEEP of 5  General: Comfortable at this time Neck: supple Cardiovascular: no malignant arrhythmias Respiratory: No rhonchi no rales are noted at this time Skin: no rash seen on limited exam Musculoskeletal: No gross abnormality Psychiatric:unable to assess Neurologic:no involuntary movements         Lab Data:   Basic Metabolic Panel: Recent Labs  Lab 07/24/22 0220 07/27/22 0111 07/28/22 0129  NA 142 153* 153*  K 4.1 3.8 4.2  CL 108 116* 112*  CO2 '29 31 29  '$ GLUCOSE 131* 136* 130*  BUN 59* 67* 73*  CREATININE 0.97 1.14* 1.15*  CALCIUM 9.6 10.3 10.1  MG 2.5*  --   --     ABG: No results for input(s): "PHART", "PCO2ART", "PO2ART", "HCO3", "O2SAT" in the last 168 hours.  Liver Function Tests: No results for input(s): "AST", "ALT", "ALKPHOS", "BILITOT", "PROT", "ALBUMIN" in the last 168 hours. No results for input(s): "LIPASE", "AMYLASE" in the last 168 hours. No results for input(s): "AMMONIA" in the last 168 hours.  CBC: Recent Labs  Lab 07/24/22 0220 07/27/22 0111  WBC 5.7 7.5  HGB 8.0* 8.8*  HCT 25.3* 28.4*  MCV 89.7 91.9  PLT 192 178    Cardiac Enzymes: No results for input(s): "CKTOTAL", "CKMB", "CKMBINDEX", "TROPONINI" in the last 168 hours.  BNP (last 3 results) Recent Labs    04/08/22 0336  BNP 51.6     ProBNP (last 3 results) No results for input(s): "PROBNP" in the last 8760 hours.  Radiological Exams: No results found.  Assessment/Plan Active Problems:   Sickle cell trait (HCC)   Acute on chronic respiratory failure with hypoxia (HCC)   Pneumonia of right lung due to Pseudomonas species (HCC)   Pleural effusion, right   Tracheostomy dependence (HCC)   Acute on chronic respiratory failure hypoxia plan is to continue with full support on the ventilator patient is at baseline we will continue with supportive care Sickle cell trait no change supportive care Pneumonia due to Pseudomonas has been treated improving with Pleural effusion or following radiologically Tracheostomy dependence no change we will continue to monitor   I have personally seen and evaluated the patient, evaluated laboratory and imaging results, formulated the assessment and plan and placed orders. The Patient requires high complexity decision making with multiple systems involvement.  Rounds were done with the Respiratory Therapy Director and Staff therapists and discussed with nursing staff also.  Allyne Gee, MD [facility] Pulmonary Critical Care Medicine Sleep Medicine

## 2022-07-30 LAB — BASIC METABOLIC PANEL
Anion gap: 8 (ref 5–15)
BUN: 66 mg/dL — ABNORMAL HIGH (ref 8–23)
CO2: 33 mmol/L — ABNORMAL HIGH (ref 22–32)
Calcium: 10 mg/dL (ref 8.9–10.3)
Chloride: 112 mmol/L — ABNORMAL HIGH (ref 98–111)
Creatinine, Ser: 0.99 mg/dL (ref 0.44–1.00)
GFR, Estimated: >60 mL/min (ref >60)
Glucose, Bld: 109 mg/dL — ABNORMAL HIGH (ref 70–99)
Potassium: 4.1 mmol/L (ref 3.5–5.1)
Sodium: 153 mmol/L — ABNORMAL HIGH (ref 135–145)

## 2022-07-30 LAB — CBC
HCT: 25.2 % — ABNORMAL LOW (ref 36.0–46.0)
Hemoglobin: 7.8 g/dL — ABNORMAL LOW (ref 12.0–15.0)
MCH: 28.6 pg (ref 26.0–34.0)
MCHC: 31 g/dL (ref 30.0–36.0)
MCV: 92.3 fL (ref 80.0–100.0)
Platelets: 144 10*3/uL — ABNORMAL LOW (ref 150–400)
RBC: 2.73 MIL/uL — ABNORMAL LOW (ref 3.87–5.11)
RDW: 15.8 % — ABNORMAL HIGH (ref 11.5–15.5)
WBC: 6 10*3/uL (ref 4.0–10.5)
nRBC: 0 % (ref 0.0–0.2)

## 2022-07-30 NOTE — Progress Notes (Signed)
Pulmonary [name]   PULMONARY CRITICAL CARE SERVICE  PROGRESS NOTE     WARDA MCQUEARY  VWP:[id number]  DOB: 05-Feb-1951   DOA: 05/26/2022  Referring Physician: Satira Sark, MD  HPI: Sheila Fernandez is a 71 y.o. female being followed for ventilator/airway/oxygen weaning Acute on Chronic Respiratory Failure.  Patient is on assist control currently on 28% FiO2 saturations are good  Medications: Reviewed on Rounds  Physical Exam:  Vitals: Temperature is 98.5 pulse 86 respiratory 22 blood pressure 98/62 saturations 94%  Ventilator Settings assist-control FiO2 28% tidal volume 400 PEEP 5  General: Comfortable at this time Neck: supple Cardiovascular: no malignant arrhythmias Respiratory: Scattered rhonchi expansion is equal Skin: no rash seen on limited exam Musculoskeletal: No gross abnormality Psychiatric:unable to assess Neurologic:no involuntary movements         Lab Data:   Basic Metabolic Panel: Recent Labs  Lab 07/24/22 0220 07/27/22 0111 07/28/22 0129 07/30/22 0229  NA 142 153* 153* 153*  K 4.1 3.8 4.2 4.1  CL 108 116* 112* 112*  CO2 '29 31 29 '$ 33*  GLUCOSE 131* 136* 130* 109*  BUN 59* 67* 73* 66*  CREATININE 0.97 1.14* 1.15* 0.99  CALCIUM 9.6 10.3 10.1 10.0  MG 2.5*  --   --   --     ABG: No results for input(s): "PHART", "PCO2ART", "PO2ART", "HCO3", "O2SAT" in the last 168 hours.  Liver Function Tests: No results for input(s): "AST", "ALT", "ALKPHOS", "BILITOT", "PROT", "ALBUMIN" in the last 168 hours. No results for input(s): "LIPASE", "AMYLASE" in the last 168 hours. No results for input(s): "AMMONIA" in the last 168 hours.  CBC: Recent Labs  Lab 07/24/22 0220 07/27/22 0111 07/30/22 0229  WBC 5.7 7.5 6.0  HGB 8.0* 8.8* 7.8*  HCT 25.3* 28.4* 25.2*  MCV 89.7 91.9 92.3  PLT 192 178 144*    Cardiac Enzymes: No results for input(s): "CKTOTAL", "CKMB", "CKMBINDEX", "TROPONINI" in the  last 168 hours.  BNP (last 3 results) Recent Labs    04/08/22 0336  BNP 51.6    ProBNP (last 3 results) No results for input(s): "PROBNP" in the last 8760 hours.  Radiological Exams: No results found.  Assessment/Plan Active Problems:   Sickle cell trait (HCC)   Acute on chronic respiratory failure with hypoxia (HCC)   Pneumonia of right lung due to Pseudomonas species (HCC)   Pleural effusion, right   Tracheostomy dependence (HCC)   Acute on chronic respiratory failure with hypoxia we will continue with full support on the ventilator continue pulmonary toilet and supportive care. Sickle cell trait no change Pseudomonas pneumonia has been treated we will continue to follow along Pleural effusion at baseline Trach dependent   I have personally seen and evaluated the patient, evaluated laboratory and imaging results, formulated the assessment and plan and placed orders. The Patient requires high complexity decision making with multiple systems involvement.  Rounds were done with the Respiratory Therapy Director and Staff therapists and discussed with nursing staff also.  Allyne Gee, MD [facility] Pulmonary Critical Care Medicine Sleep Medicine

## 2022-07-31 NOTE — Progress Notes (Addendum)
Pulmonary [place]   PULMONARY CRITICAL CARE SERVICE  PROGRESS NOTE     Sheila Fernandez  [id number]  DOB: 08/17/1951   DOA: 05/26/2022  Referring Physician: Satira Sark, MD  HPI: Sheila Fernandez is a 71 y.o. female being followed for ventilator/airway/oxygen weaning Acute on Chronic Respiratory Failure.  Patient seen lying in bed, currently remains on baseline of full support ventilation.  Medications: Reviewed on Rounds  Physical Exam:  Vitals: Temp 97.3, pulse 67, respirations 15, BP 89/57, SPO2 100%  Ventilator Settings AC VC plus, FiO2 20%, tidal volume 400, rate 12, PEEP 5  General: Comfortable at this time Neck: supple Cardiovascular: no malignant arrhythmias Respiratory: Bilaterally coarse Skin: no rash seen on limited exam Musculoskeletal: No gross abnormality Psychiatric:unable to assess Neurologic:no involuntary movements         Lab Data:   Basic Metabolic Panel: Recent Labs  Lab 07/27/22 0111 07/28/22 0129 07/30/22 0229  NA 153* 153* 153*  K 3.8 4.2 4.1  CL 116* 112* 112*  CO2 31 29 33*  GLUCOSE 136* 130* 109*  BUN 67* 73* 66*  CREATININE 1.14* 1.15* 0.99  CALCIUM 10.3 10.1 10.0    ABG: No results for input(s): "PHART", "PCO2ART", "PO2ART", "HCO3", "O2SAT" in the last 168 hours.  Liver Function Tests: No results for input(s): "AST", "ALT", "ALKPHOS", "BILITOT", "PROT", "ALBUMIN" in the last 168 hours. No results for input(s): "LIPASE", "AMYLASE" in the last 168 hours. No results for input(s): "AMMONIA" in the last 168 hours.  CBC: Recent Labs  Lab 07/27/22 0111 07/30/22 0229  WBC 7.5 6.0  HGB 8.8* 7.8*  HCT 28.4* 25.2*  MCV 91.9 92.3  PLT 178 144*    Cardiac Enzymes: No results for input(s): "CKTOTAL", "CKMB", "CKMBINDEX", "TROPONINI" in the last 168 hours.  BNP (last 3 results) Recent Labs    04/08/22 0336  BNP 51.6    ProBNP (last 3 results) No results for  input(s): "PROBNP" in the last 8760 hours.  Radiological Exams: No results found.  Assessment/Plan Active Problems:   Sickle cell trait (HCC)   Acute on chronic respiratory failure with hypoxia (HCC)   Pneumonia of right lung due to Pseudomonas species (HCC)   Pleural effusion, right   Tracheostomy dependence (HCC)   Acute on chronic respiratory failure with hypoxia-continue with full support ventilation.  Continue with supportive care. Sickle cell trait-no overall change.  Continue supportive care. Status post tracheostomy-remains stable in place.  Continue with trach care per protocol.   Pseudomonas pneumonia-has been treated.  Continue supportive care.   I have personally seen and evaluated the patient, evaluated laboratory and imaging results, formulated the assessment and plan and placed orders. The Patient requires high complexity decision making with multiple systems involvement.  Rounds were done with the Respiratory Therapy Director and Staff therapists and discussed with nursing staff also.  Allyne Gee, MD [facility] Pulmonary Critical Care Medicine Sleep Medicine

## 2022-07-31 NOTE — Progress Notes (Addendum)
Pulmonary [place]   PULMONARY CRITICAL CARE SERVICE  PROGRESS NOTE     Sheila Fernandez  [id number]  DOB: January 24, 1951   DOA: 05/26/2022  Referring Physician: Satira Sark, MD  HPI: Sheila Fernandez is a 71 y.o. female being followed for ventilator/airway/oxygen weaning Acute on Chronic Respiratory Failure.  Patient seen lying in bed, currently remains on baseline of full support ventilation.  Patient does continue to have a small amount of thick white secretions.  Medications: Reviewed on Rounds  Physical Exam:  Vitals: Temp 97.1, pulse 65, respirations 21, BP 115/78, SPO2 100%  Ventilator Settings AC VC, FiO2 20%, tidal volume 400, rate 12 , PEEP 5  General: Comfortable at this time Neck: supple Cardiovascular: no malignant arrhythmias Respiratory: Bilaterally diminished Skin: no rash seen on limited exam Musculoskeletal: No gross abnormality Psychiatric:unable to assess Neurologic:no involuntary movements         Lab Data:   Basic Metabolic Panel: Recent Labs  Lab 07/27/22 0111 07/28/22 0129 07/30/22 0229  NA 153* 153* 153*  K 3.8 4.2 4.1  CL 116* 112* 112*  CO2 31 29 33*  GLUCOSE 136* 130* 109*  BUN 67* 73* 66*  CREATININE 1.14* 1.15* 0.99  CALCIUM 10.3 10.1 10.0    ABG: No results for input(s): "PHART", "PCO2ART", "PO2ART", "HCO3", "O2SAT" in the last 168 hours.  Liver Function Tests: No results for input(s): "AST", "ALT", "ALKPHOS", "BILITOT", "PROT", "ALBUMIN" in the last 168 hours. No results for input(s): "LIPASE", "AMYLASE" in the last 168 hours. No results for input(s): "AMMONIA" in the last 168 hours.  CBC: Recent Labs  Lab 07/27/22 0111 07/30/22 0229  WBC 7.5 6.0  HGB 8.8* 7.8*  HCT 28.4* 25.2*  MCV 91.9 92.3  PLT 178 144*    Cardiac Enzymes: No results for input(s): "CKTOTAL", "CKMB", "CKMBINDEX", "TROPONINI" in the last 168 hours.  BNP (last 3 results) Recent Labs     04/08/22 0336  BNP 51.6    ProBNP (last 3 results) No results for input(s): "PROBNP" in the last 8760 hours.  Radiological Exams: No results found.  Assessment/Plan Active Problems:   Sickle cell trait (HCC)   Acute on chronic respiratory failure with hypoxia (HCC)   Pneumonia of right lung due to Pseudomonas species (HCC)   Pleural effusion, right   Tracheostomy dependence (HCC)  Acute on chronic respiratory failure with hypoxia we will continue with full support on the ventilator continue pulmonary toilet and supportive care. Sickle cell trait no change Pseudomonas pneumonia has been treated we will continue to follow along Pleural effusion at baseline Trach dependent   I have personally seen and evaluated the patient, evaluated laboratory and imaging results, formulated the assessment and plan and placed orders. The Patient requires high complexity decision making with multiple systems involvement.  Rounds were done with the Respiratory Therapy Director and Staff therapists and discussed with nursing staff also.  Allyne Gee, MD [facility] Pulmonary Critical Care Medicine Sleep Medicine

## 2022-08-01 LAB — BASIC METABOLIC PANEL
Anion gap: 3 — ABNORMAL LOW (ref 5–15)
BUN: 65 mg/dL — ABNORMAL HIGH (ref 8–23)
CO2: 34 mmol/L — ABNORMAL HIGH (ref 22–32)
Calcium: 9.7 mg/dL (ref 8.9–10.3)
Chloride: 117 mmol/L — ABNORMAL HIGH (ref 98–111)
Creatinine, Ser: 0.96 mg/dL (ref 0.44–1.00)
GFR, Estimated: >60 mL/min (ref >60)
Glucose, Bld: 120 mg/dL — ABNORMAL HIGH (ref 70–99)
Potassium: 3.8 mmol/L (ref 3.5–5.1)
Sodium: 154 mmol/L — ABNORMAL HIGH (ref 135–145)

## 2022-08-02 NOTE — Progress Notes (Cosign Needed)
Pulmonary [place]   PULMONARY CRITICAL CARE SERVICE  PROGRESS NOTE     Sheila Fernandez  [id number]  DOB: 08/02/1951   DOA: 05/26/2022  Referring Physician: Satira Sark, MD  HPI: Sheila Fernandez is a 71 y.o. female being followed for ventilator/airway/oxygen weaning Acute on Chronic Respiratory Failure.  Patient seen lying in bed, currently remains on baseline full support ventilation.  No acute overnight events noted.  Medications: Reviewed on Rounds  Physical Exam:  Vitals: 123, pulse 69, respirations 12, BP 111/70, SPO2 100%  Ventilator Settings AC VC plus, FiO2 20%, tidal volume 400, rate 12, PEEP 5  General: Comfortable at this time Neck: supple Cardiovascular: no malignant arrhythmias Respiratory: Bilaterally diminished Skin: no rash seen on limited exam Musculoskeletal: No gross abnormality Psychiatric:unable to assess Neurologic:no involuntary movements         Lab Data:   Basic Metabolic Panel: Recent Labs  Lab 07/27/22 0111 07/28/22 0129 07/30/22 0229 08/01/22 0134  NA 153* 153* 153* 154*  K 3.8 4.2 4.1 3.8  CL 116* 112* 112* 117*  CO2 31 29 33* 34*  GLUCOSE 136* 130* 109* 120*  BUN 67* 73* 66* 65*  CREATININE 1.14* 1.15* 0.99 0.96  CALCIUM 10.3 10.1 10.0 9.7    ABG: No results for input(s): "PHART", "PCO2ART", "PO2ART", "HCO3", "O2SAT" in the last 168 hours.  Liver Function Tests: No results for input(s): "AST", "ALT", "ALKPHOS", "BILITOT", "PROT", "ALBUMIN" in the last 168 hours. No results for input(s): "LIPASE", "AMYLASE" in the last 168 hours. No results for input(s): "AMMONIA" in the last 168 hours.  CBC: Recent Labs  Lab 07/27/22 0111 07/30/22 0229  WBC 7.5 6.0  HGB 8.8* 7.8*  HCT 28.4* 25.2*  MCV 91.9 92.3  PLT 178 144*    Cardiac Enzymes: No results for input(s): "CKTOTAL", "CKMB", "CKMBINDEX", "TROPONINI" in the last 168 hours.  BNP (last 3 results) Recent  Labs    04/08/22 0336  BNP 51.6    ProBNP (last 3 results) No results for input(s): "PROBNP" in the last 8760 hours.  Radiological Exams: No results found.  Assessment/Plan Active Problems:   Sickle cell trait (HCC)   Acute on chronic respiratory failure with hypoxia (HCC)   Pneumonia of right lung due to Pseudomonas species (HCC)   Pleural effusion, right   Tracheostomy dependence (HCC)   Acute on chronic respiratory failure with hypoxia-continue with full support ventilation.  Continue with supportive care. Sickle cell trait-no overall change.  Continue supportive care. Status post tracheostomy-remains stable in place.  Continue with trach care per protocol.   Pseudomonas pneumonia-has been treated.  Continue supportive care.   I have personally seen and evaluated the patient, evaluated laboratory and imaging results, formulated the assessment and plan and placed orders. The Patient requires high complexity decision making with multiple systems involvement.  Rounds were done with the Respiratory Therapy Director and Staff therapists and discussed with nursing staff also.  Allyne Gee, MD [facility] Pulmonary Critical Care Medicine Sleep Medicine

## 2022-08-02 NOTE — Progress Notes (Cosign Needed)
Pulmonary [place]   PULMONARY CRITICAL CARE SERVICE  PROGRESS NOTE     Sheila Fernandez  [id number]  DOB: [birth year]   DOA: 05/26/2022  Referring Physician: Satira Sark, MD  HPI: Sheila Fernandez is a 71 y.o. female being followed for ventilator/airway/oxygen weaning Acute on Chronic Respiratory Failure.  Patient seen lying in bed, currently remains on high baseline of full support ventilation.  Medications: Reviewed on Rounds  Physical Exam:  Vitals: Temp 97.9, pulse 71, respirations 17, BP 98/65, SPO2 100%  Ventilator Settings AC VC plus, FiO2 20%, tidal volume 400, rate 12, PEEP 5  General: Comfortable at this time Neck: supple Cardiovascular: no malignant arrhythmias Respiratory: Bilaterally diminished Skin: no rash seen on limited exam Musculoskeletal: No gross abnormality Psychiatric:unable to assess Neurologic:no involuntary movements         Lab Data:   Basic Metabolic Panel: Recent Labs  Lab 07/27/22 0111 07/28/22 0129 07/30/22 0229 08/01/22 0134  NA 153* 153* 153* 154*  K 3.8 4.2 4.1 3.8  CL 116* 112* 112* 117*  CO2 31 29 33* 34*  GLUCOSE 136* 130* 109* 120*  BUN 67* 73* 66* 65*  CREATININE 1.14* 1.15* 0.99 0.96  CALCIUM 10.3 10.1 10.0 9.7    ABG: No results for input(s): "PHART", "PCO2ART", "PO2ART", "HCO3", "O2SAT" in the last 168 hours.  Liver Function Tests: No results for input(s): "AST", "ALT", "ALKPHOS", "BILITOT", "PROT", "ALBUMIN" in the last 168 hours. No results for input(s): "LIPASE", "AMYLASE" in the last 168 hours. No results for input(s): "AMMONIA" in the last 168 hours.  CBC: Recent Labs  Lab 07/27/22 0111 07/30/22 0229  WBC 7.5 6.0  HGB 8.8* 7.8*  HCT 28.4* 25.2*  MCV 91.9 92.3  PLT 178 144*    Cardiac Enzymes: No results for input(s): "CKTOTAL", "CKMB", "CKMBINDEX", "TROPONINI" in the last 168 hours.  BNP (last 3 results) Recent Labs    04/08/22 0336   BNP 51.6    ProBNP (last 3 results) No results for input(s): "PROBNP" in the last 8760 hours.  Radiological Exams: No results found.  Assessment/Plan Active Problems:   Sickle cell trait (HCC)   Acute on chronic respiratory failure with hypoxia (HCC)   Pneumonia of right lung due to Pseudomonas species (HCC)   Pleural effusion, right   Tracheostomy dependence (HCC)  Acute on chronic respiratory failure with hypoxia-continue with full support ventilation.  Continue with supportive care. Sickle cell trait-no overall change.  Continue supportive care. Status post tracheostomy-remains stable in place.  Continue with trach care per protocol.   Pseudomonas pneumonia-has been treated.  Continue supportive care.  I have personally seen and evaluated the patient, evaluated laboratory and imaging results, formulated the assessment and plan and placed orders. The Patient requires high complexity decision making with multiple systems involvement.  Rounds were done with the Respiratory Therapy Director and Staff therapists and discussed with nursing staff also.  Allyne Gee, MD [facility] Pulmonary Critical Care Medicine Sleep Medicine

## 2022-08-03 LAB — SODIUM: Sodium: 145 mmol/L (ref 135–145)

## 2022-08-04 LAB — CBC
HCT: 25 % — ABNORMAL LOW (ref 36.0–46.0)
Hemoglobin: 7.6 g/dL — ABNORMAL LOW (ref 12.0–15.0)
MCH: 28.4 pg (ref 26.0–34.0)
MCHC: 30.4 g/dL (ref 30.0–36.0)
MCV: 93.3 fL (ref 80.0–100.0)
Platelets: 129 10*3/uL — ABNORMAL LOW (ref 150–400)
RBC: 2.68 MIL/uL — ABNORMAL LOW (ref 3.87–5.11)
RDW: 15.9 % — ABNORMAL HIGH (ref 11.5–15.5)
WBC: 5.4 10*3/uL (ref 4.0–10.5)
nRBC: 0 % (ref 0.0–0.2)

## 2022-08-04 LAB — BASIC METABOLIC PANEL
Anion gap: 3 — ABNORMAL LOW (ref 5–15)
BUN: 41 mg/dL — ABNORMAL HIGH (ref 8–23)
CO2: 27 mmol/L (ref 22–32)
Calcium: 9.1 mg/dL (ref 8.9–10.3)
Chloride: 110 mmol/L (ref 98–111)
Creatinine, Ser: 0.82 mg/dL (ref 0.44–1.00)
GFR, Estimated: >60 mL/min (ref >60)
Glucose, Bld: 126 mg/dL — ABNORMAL HIGH (ref 70–99)
Potassium: 4.2 mmol/L (ref 3.5–5.1)
Sodium: 140 mmol/L (ref 135–145)

## 2022-08-04 NOTE — Progress Notes (Cosign Needed)
Pulmonary [place]   PULMONARY CRITICAL CARE SERVICE  PROGRESS NOTE     Sheila Fernandez  [id number]  DOB: [birth year]   DOA: 05/26/2022  Referring Physician: Satira Sark, MD  HPI: Sheila Fernandez is a 71 y.o. female being followed for ventilator/airway/oxygen weaning Acute on Chronic Respiratory Failure.  Patient seen lying in bed, currently remains on baseline full support ventilation.  Medications: Reviewed on Rounds  Physical Exam:  Vitals: Temp 96.7, pulse 73, respirations 25, BP 123/85, SPO2 100%  Ventilator Settings AC VC plus, FiO2 28%, tidal volume 400, rate 12, PEEP 5   General: Comfortable at this time Neck: supple Cardiovascular: no malignant arrhythmias Respiratory: Bilaterally diminished Skin: no rash seen on limited exam Musculoskeletal: No gross abnormality Psychiatric:unable to assess Neurologic:no involuntary movements         Lab Data:   Basic Metabolic Panel: Recent Labs  Lab 07/30/22 0229 08/01/22 0134 08/03/22 0112 08/04/22 0138  NA 153* 154* 145 140  K 4.1 3.8  --  4.2  CL 112* 117*  --  110  CO2 33* 34*  --  27  GLUCOSE 109* 120*  --  126*  BUN 66* 65*  --  41*  CREATININE 0.99 0.96  --  0.82  CALCIUM 10.0 9.7  --  9.1    ABG: No results for input(s): "PHART", "PCO2ART", "PO2ART", "HCO3", "O2SAT" in the last 168 hours.  Liver Function Tests: No results for input(s): "AST", "ALT", "ALKPHOS", "BILITOT", "PROT", "ALBUMIN" in the last 168 hours. No results for input(s): "LIPASE", "AMYLASE" in the last 168 hours. No results for input(s): "AMMONIA" in the last 168 hours.  CBC: Recent Labs  Lab 07/30/22 0229 08/04/22 0138  WBC 6.0 5.4  HGB 7.8* 7.6*  HCT 25.2* 25.0*  MCV 92.3 93.3  PLT 144* 129*    Cardiac Enzymes: No results for input(s): "CKTOTAL", "CKMB", "CKMBINDEX", "TROPONINI" in the last 168 hours.  BNP (last 3 results) Recent Labs    04/08/22 0336  BNP  51.6    ProBNP (last 3 results) No results for input(s): "PROBNP" in the last 8760 hours.  Radiological Exams: No results found.  Assessment/Plan Active Problems:   Sickle cell trait (HCC)   Acute on chronic respiratory failure with hypoxia (HCC)   Pneumonia of right lung due to Pseudomonas species (HCC)   Pleural effusion, right   Tracheostomy dependence (HCC)  Acute on chronic respiratory failure with hypoxia-continue with full support ventilation.  Continue with supportive care. Sickle cell trait-no overall change.  Continue supportive care. Status post tracheostomy-remains stable in place.  Continue with trach care per protocol.   Pseudomonas pneumonia-has been treated.  Continue supportive care.       I have personally seen and evaluated the patient, evaluated laboratory and imaging results, formulated the assessment and plan and placed orders. The Patient requires high complexity decision making with multiple systems involvement.  Rounds were done with the Respiratory Therapy Director and Staff therapists and discussed with nursing staff also.  Allyne Gee, MD [facility] Pulmonary Critical Care Medicine Sleep Medicine

## 2022-08-04 NOTE — Progress Notes (Cosign Needed)
Pulmonary [place]   PULMONARY CRITICAL CARE SERVICE  PROGRESS NOTE     GLORIOUS FLICKER  [id number]  DOB: 11-Jan-1951   DOA: 05/26/2022  Referring Physician: Satira Sark, MD  HPI: Sheila Fernandez is a 71 y.o. female being followed for ventilator/airway/oxygen weaning Acute on Chronic Respiratory Failure.  Lying in bed, currently remains on full support ventilation.  No acute overnight events noted.  Medications: Reviewed on Rounds  Physical Exam:  Vitals: Temp 97.4, pulse 84, respirations 22, BP 113/71, SPO2 100%  Ventilator Settings AC VC plus, FiO2 28%, tidal volume 400, rate 12, PEEP 5    General: Comfortable at this time Neck: supple Cardiovascular: no malignant arrhythmias Respiratory: Bilaterally diminished Skin: no rash seen on limited exam Musculoskeletal: No gross abnormality Psychiatric:unable to assess Neurologic:no involuntary movements         Lab Data:   Basic Metabolic Panel: Recent Labs  Lab 07/30/22 0229 08/01/22 0134 08/03/22 0112 08/04/22 0138  NA 153* 154* 145 140  K 4.1 3.8  --  4.2  CL 112* 117*  --  110  CO2 33* 34*  --  27  GLUCOSE 109* 120*  --  126*  BUN 66* 65*  --  41*  CREATININE 0.99 0.96  --  0.82  CALCIUM 10.0 9.7  --  9.1    ABG: No results for input(s): "PHART", "PCO2ART", "PO2ART", "HCO3", "O2SAT" in the last 168 hours.  Liver Function Tests: No results for input(s): "AST", "ALT", "ALKPHOS", "BILITOT", "PROT", "ALBUMIN" in the last 168 hours. No results for input(s): "LIPASE", "AMYLASE" in the last 168 hours. No results for input(s): "AMMONIA" in the last 168 hours.  CBC: Recent Labs  Lab 07/30/22 0229 08/04/22 0138  WBC 6.0 5.4  HGB 7.8* 7.6*  HCT 25.2* 25.0*  MCV 92.3 93.3  PLT 144* 129*    Cardiac Enzymes: No results for input(s): "CKTOTAL", "CKMB", "CKMBINDEX", "TROPONINI" in the last 168 hours.  BNP (last 3 results) Recent Labs     04/08/22 0336  BNP 51.6    ProBNP (last 3 results) No results for input(s): "PROBNP" in the last 8760 hours.  Radiological Exams: No results found.  Assessment/Plan Active Problems:   Sickle cell trait (HCC)   Acute on chronic respiratory failure with hypoxia (HCC)   Pneumonia of right lung due to Pseudomonas species (HCC)   Pleural effusion, right   Tracheostomy dependence (HCC)   Acute on chronic respiratory failure with hypoxia-continue with full support ventilation.  Continue with supportive care. Sickle cell trait-no overall change.  Continue supportive care. Status post tracheostomy-remains stable in place.  Continue with trach care per protocol.   Pseudomonas pneumonia-has been treated.  Continue supportive care.   I have personally seen and evaluated the patient, evaluated laboratory and imaging results, formulated the assessment and plan and placed orders. The Patient requires high complexity decision making with multiple systems involvement.  Rounds were done with the Respiratory Therapy Director and Staff therapists and discussed with nursing staff also.  Allyne Gee, MD [facility] Pulmonary Critical Care Medicine Sleep Medicine

## 2022-08-06 LAB — BASIC METABOLIC PANEL
Anion gap: 7 (ref 5–15)
BUN: 28 mg/dL — ABNORMAL HIGH (ref 8–23)
CO2: 25 mmol/L (ref 22–32)
Calcium: 9.4 mg/dL (ref 8.9–10.3)
Chloride: 104 mmol/L (ref 98–111)
Creatinine, Ser: 0.65 mg/dL (ref 0.44–1.00)
GFR, Estimated: >60 mL/min (ref >60)
Glucose, Bld: 99 mg/dL (ref 70–99)
Potassium: 4.2 mmol/L (ref 3.5–5.1)
Sodium: 136 mmol/L (ref 135–145)

## 2022-08-06 NOTE — Progress Notes (Cosign Needed)
Pulmonary [place]   PULMONARY CRITICAL CARE SERVICE  PROGRESS NOTE     Sheila Fernandez  FIE:[id number]  DOB: 01-26-1951   DOA: 05/26/2022  Referring Physician: Satira Sark, MD  HPI: Sheila Fernandez is a 71 y.o. female being followed for ventilator/airway/oxygen weaning Acute on Chronic Respiratory Failure.  Patient seen lying in bed, currently remains on baseline of full support ventilation.  Medications: Reviewed on Rounds  Physical Exam:  Vitals: Temp 96.9, pulse 80, respirations 21, BP 121/76, SPO2 100%  Ventilator Settings AC VC, FiO2 20%, tidal line 400, rate 12, PEEP 5  General: Comfortable at this time Neck: supple Cardiovascular: no malignant arrhythmias Respiratory: Bilaterally diminished Skin: no rash seen on limited exam Musculoskeletal: No gross abnormality Psychiatric:unable to assess Neurologic:no involuntary movements         Lab Data:   Basic Metabolic Panel: Recent Labs  Lab 08/01/22 0134 08/03/22 0112 08/04/22 0138 08/06/22 0234  NA 154* 145 140 136  K 3.8  --  4.2 4.2  CL 117*  --  110 104  CO2 34*  --  27 25  GLUCOSE 120*  --  126* 99  BUN 65*  --  41* 28*  CREATININE 0.96  --  0.82 0.65  CALCIUM 9.7  --  9.1 9.4    ABG: No results for input(s): "PHART", "PCO2ART", "PO2ART", "HCO3", "O2SAT" in the last 168 hours.  Liver Function Tests: No results for input(s): "AST", "ALT", "ALKPHOS", "BILITOT", "PROT", "ALBUMIN" in the last 168 hours. No results for input(s): "LIPASE", "AMYLASE" in the last 168 hours. No results for input(s): "AMMONIA" in the last 168 hours.  CBC: Recent Labs  Lab 08/04/22 0138  WBC 5.4  HGB 7.6*  HCT 25.0*  MCV 93.3  PLT 129*    Cardiac Enzymes: No results for input(s): "CKTOTAL", "CKMB", "CKMBINDEX", "TROPONINI" in the last 168 hours.  BNP (last 3 results) Recent Labs    04/08/22 0336  BNP 51.6    ProBNP (last 3 results) No results for  input(s): "PROBNP" in the last 8760 hours.  Radiological Exams: No results found.  Assessment/Plan Active Problems:   Sickle cell trait (HCC)   Acute on chronic respiratory failure with hypoxia (HCC)   Pneumonia of right lung due to Pseudomonas species (HCC)   Pleural effusion, right   Tracheostomy dependence (HCC)   Acute on chronic respiratory failure with hypoxia-continue with full support ventilation.  Continue with supportive care. Sickle cell trait-no overall change.  Continue supportive care. Status post tracheostomy-remains stable in place.  Continue with trach care per protocol.   Pseudomonas pneumonia-has been treated.  Continue supportive care.    I have personally seen and evaluated the patient, evaluated laboratory and imaging results, formulated the assessment and plan and placed orders. The Patient requires high complexity decision making with multiple systems involvement.  Rounds were done with the Respiratory Therapy Director and Staff therapists and discussed with nursing staff also.  Allyne Gee, MD [facility] Pulmonary Critical Care Medicine Sleep Medicine

## 2022-08-06 NOTE — Progress Notes (Cosign Needed)
Pulmonary [place]   PULMONARY CRITICAL CARE SERVICE  PROGRESS NOTE     Sheila Fernandez  IDP:[id number]  DOB: 07-Aug-1951   DOA: 05/26/2022  Referring Physician: Satira Sark, MD  HPI: Sheila Fernandez is a 71 y.o. female being followed for ventilator/airway/oxygen weaning Acute on Chronic Respiratory Failure.  Patient seen lying in bed, currently remains on baseline of full support ventilation.  No overnight events noted.  Medications: Reviewed on Rounds  Physical Exam:  Vitals: Temp 97.3, pulse 68, respirations 24, BP 139/82, SPO2 100%  Ventilator Settings AC VC, FiO2 20%, tidal volume 400, rate 12, PEEP 5  General: Comfortable at this time Neck: supple Cardiovascular: no malignant arrhythmias Respiratory: Bilaterally diminished Skin: no rash seen on limited exam Musculoskeletal: No gross abnormality Psychiatric:unable to assess Neurologic:no involuntary movements         Lab Data:   Basic Metabolic Panel: Recent Labs  Lab 08/01/22 0134 08/03/22 0112 08/04/22 0138 08/06/22 0234  NA 154* 145 140 136  K 3.8  --  4.2 4.2  CL 117*  --  110 104  CO2 34*  --  27 25  GLUCOSE 120*  --  126* 99  BUN 65*  --  41* 28*  CREATININE 0.96  --  0.82 0.65  CALCIUM 9.7  --  9.1 9.4    ABG: No results for input(s): "PHART", "PCO2ART", "PO2ART", "HCO3", "O2SAT" in the last 168 hours.  Liver Function Tests: No results for input(s): "AST", "ALT", "ALKPHOS", "BILITOT", "PROT", "ALBUMIN" in the last 168 hours. No results for input(s): "LIPASE", "AMYLASE" in the last 168 hours. No results for input(s): "AMMONIA" in the last 168 hours.  CBC: Recent Labs  Lab 08/04/22 0138  WBC 5.4  HGB 7.6*  HCT 25.0*  MCV 93.3  PLT 129*    Cardiac Enzymes: No results for input(s): "CKTOTAL", "CKMB", "CKMBINDEX", "TROPONINI" in the last 168 hours.  BNP (last 3 results) Recent Labs    04/08/22 0336  BNP 51.6    ProBNP  (last 3 results) No results for input(s): "PROBNP" in the last 8760 hours.  Radiological Exams: No results found.  Assessment/Plan Active Problems:   Sickle cell trait (HCC)   Acute on chronic respiratory failure with hypoxia (HCC)   Pneumonia of right lung due to Pseudomonas species (HCC)   Pleural effusion, right   Tracheostomy dependence (HCC)   Acute on chronic respiratory failure with hypoxia-continue with full support ventilation.  Continue with supportive care. Sickle cell trait-no overall change.  Continue supportive care. Status post tracheostomy-remains stable in place.  Continue with trach care per protocol.   Pseudomonas pneumonia-has been treated.  Continue supportive care.   I have personally seen and evaluated the patient, evaluated laboratory and imaging results, formulated the assessment and plan and placed orders. The Patient requires high complexity decision making with multiple systems involvement.  Rounds were done with the Respiratory Therapy Director and Staff therapists and discussed with nursing staff also.  Allyne Gee, MD [facility] Pulmonary Critical Care Medicine Sleep Medicine

## 2022-08-07 NOTE — Progress Notes (Signed)
Pulmonary [place]   PULMONARY CRITICAL CARE SERVICE  PROGRESS NOTE     Sheila Fernandez  DGL:[id number]  DOB: Sep 07, 1951   DOA: 05/26/2022  Referring Physician: Satira Sark, MD  HPI: Sheila Fernandez is a 71 y.o. female being followed for ventilator/airway/oxygen weaning Acute on Chronic Respiratory Failure.  Patient is on the ventilator full support she is at baseline not weaning  Medications: Reviewed on Rounds  Physical Exam:  Vitals: Temperature is 97.1 pulse 68 respiratory 20 blood pressure is 122/67 saturations 100%  Ventilator Settings on assist control FiO2 is 28% tidal volume 400 PEEP 5  General: Comfortable at this time Neck: supple Cardiovascular: no malignant arrhythmias Respiratory: Scattered rhonchi expansion is equal Skin: no rash seen on limited exam Musculoskeletal: No gross abnormality Psychiatric:unable to assess Neurologic:no involuntary movements         Lab Data:   Basic Metabolic Panel: Recent Labs  Lab 08/01/22 0134 08/03/22 0112 08/04/22 0138 08/06/22 0234  NA 154* 145 140 136  K 3.8  --  4.2 4.2  CL 117*  --  110 104  CO2 34*  --  27 25  GLUCOSE 120*  --  126* 99  BUN 65*  --  41* 28*  CREATININE 0.96  --  0.82 0.65  CALCIUM 9.7  --  9.1 9.4    ABG: No results for input(s): "PHART", "PCO2ART", "PO2ART", "HCO3", "O2SAT" in the last 168 hours.  Liver Function Tests: No results for input(s): "AST", "ALT", "ALKPHOS", "BILITOT", "PROT", "ALBUMIN" in the last 168 hours. No results for input(s): "LIPASE", "AMYLASE" in the last 168 hours. No results for input(s): "AMMONIA" in the last 168 hours.  CBC: Recent Labs  Lab 08/04/22 0138  WBC 5.4  HGB 7.6*  HCT 25.0*  MCV 93.3  PLT 129*    Cardiac Enzymes: No results for input(s): "CKTOTAL", "CKMB", "CKMBINDEX", "TROPONINI" in the last 168 hours.  BNP (last 3 results) Recent Labs    04/08/22 0336  BNP 51.6    ProBNP  (last 3 results) No results for input(s): "PROBNP" in the last 8760 hours.  Radiological Exams: No results found.  Assessment/Plan Active Problems:   Sickle cell trait (HCC)   Acute on chronic respiratory failure with hypoxia (HCC)   Pneumonia of right lung due to Pseudomonas species (HCC)   Pleural effusion, right   Tracheostomy dependence (HCC)   Acute on chronic respiratory failure hypoxia we will continue with assist control mode has been on 28% FiO2 saturations are good Sickle cell trait no change we will continue to monitor Pneumonia due to Pseudomonas has been treated we will continue to follow along Tracheostomy remains in place we will continue with supportive care Pleural effusion follow radiologically   I have personally seen and evaluated the patient, evaluated laboratory and imaging results, formulated the assessment and plan and placed orders. The Patient requires high complexity decision making with multiple systems involvement.  Rounds were done with the Respiratory Therapy Director and Staff therapists and discussed with nursing staff also.  Allyne Gee, MD [facility] Pulmonary Critical Care Medicine Sleep Medicine

## 2022-08-08 NOTE — Progress Notes (Signed)
Pulmonary [place]   PULMONARY CRITICAL CARE SERVICE  PROGRESS NOTE     Sheila Fernandez  GQQ:[id number]  DOB: 1950-11-11   DOA: 05/26/2022  Referring Physician: Satira Sark, MD  HPI: Sheila Fernandez is a 71 y.o. female being followed for ventilator/airway/oxygen weaning Acute on Chronic Respiratory Failure.  Patient is currently on assist control appears to be comfortable without distress has been on 28% FiO2  Medications: Reviewed on Rounds  Physical Exam:  Vitals: Temperature is 96.9 pulse 54 respiratory 16 blood pressure 114/78 saturations 100%  Ventilator Settings on assist control FiO2 28% PEEP 5 tidal volume 400  General: Comfortable at this time Neck: supple Cardiovascular: no malignant arrhythmias Respiratory: No rhonchi no rales are noted at this time Skin: no rash seen on limited exam Musculoskeletal: No gross abnormality Psychiatric:unable to assess Neurologic:no involuntary movements         Lab Data:   Basic Metabolic Panel: Recent Labs  Lab 08/03/22 0112 08/04/22 0138 08/06/22 0234  NA 145 140 136  K  --  4.2 4.2  CL  --  110 104  CO2  --  27 25  GLUCOSE  --  126* 99  BUN  --  41* 28*  CREATININE  --  0.82 0.65  CALCIUM  --  9.1 9.4    ABG: No results for input(s): "PHART", "PCO2ART", "PO2ART", "HCO3", "O2SAT" in the last 168 hours.  Liver Function Tests: No results for input(s): "AST", "ALT", "ALKPHOS", "BILITOT", "PROT", "ALBUMIN" in the last 168 hours. No results for input(s): "LIPASE", "AMYLASE" in the last 168 hours. No results for input(s): "AMMONIA" in the last 168 hours.  CBC: Recent Labs  Lab 08/04/22 0138  WBC 5.4  HGB 7.6*  HCT 25.0*  MCV 93.3  PLT 129*    Cardiac Enzymes: No results for input(s): "CKTOTAL", "CKMB", "CKMBINDEX", "TROPONINI" in the last 168 hours.  BNP (last 3 results) Recent Labs    04/08/22 0336  BNP 51.6    ProBNP (last 3 results) No  results for input(s): "PROBNP" in the last 8760 hours.  Radiological Exams: No results found.  Assessment/Plan Active Problems:   Sickle cell trait (HCC)   Acute on chronic respiratory failure with hypoxia (HCC)   Pneumonia of right lung due to Pseudomonas species (HCC)   Pleural effusion, right   Tracheostomy dependence (HCC)   Acute on chronic respiratory failure hypoxia patient is at baseline on assist control mode Sickle cell trait no change overall Pneumonia of the lung supportive care Pleural effusion following patient's labs Tracheostomy remains in place   I have personally seen and evaluated the patient, evaluated laboratory and imaging results, formulated the assessment and plan and placed orders. The Patient requires high complexity decision making with multiple systems involvement.  Rounds were done with the Respiratory Therapy Director and Staff therapists and discussed with nursing staff also.  Allyne Gee, MD [facility] Pulmonary Critical Care Medicine Sleep Medicine

## 2022-09-08 ENCOUNTER — Telehealth: Payer: Self-pay | Admitting: Adult Health

## 2022-09-09 NOTE — Telephone Encounter (Signed)
Called and update Judson Roch from [facility] that this patient has not been seen in our office since 2018. And we are unaware of the medication that is needing a PA and it was not sent from our office. Nothing further needed
